# Patient Record
Sex: Female | Born: 1970 | Race: Black or African American | Hispanic: No | Marital: Single | Smoking: Former smoker
Health system: Southern US, Community
[De-identification: ages and names within clinical notes are randomized; demographics above are authoritative.]

## PROBLEM LIST (undated history)

## (undated) DIAGNOSIS — M1990 Unspecified osteoarthritis, unspecified site: Secondary | ICD-10-CM

## (undated) DIAGNOSIS — D649 Anemia, unspecified: Secondary | ICD-10-CM

## (undated) DIAGNOSIS — F419 Anxiety disorder, unspecified: Secondary | ICD-10-CM

## (undated) DIAGNOSIS — R7303 Prediabetes: Secondary | ICD-10-CM

## (undated) DIAGNOSIS — F32A Depression, unspecified: Secondary | ICD-10-CM

## (undated) DIAGNOSIS — I10 Essential (primary) hypertension: Secondary | ICD-10-CM

## (undated) HISTORY — PX: TONSILLECTOMY: SUR1361

## (undated) HISTORY — PX: ABDOMINAL HYSTERECTOMY: SHX81

## (undated) HISTORY — PX: TUBAL LIGATION: SHX77

---

## 2018-05-11 ENCOUNTER — Encounter (HOSPITAL_BASED_OUTPATIENT_CLINIC_OR_DEPARTMENT_OTHER): Payer: Self-pay

## 2018-05-11 ENCOUNTER — Emergency Department (HOSPITAL_BASED_OUTPATIENT_CLINIC_OR_DEPARTMENT_OTHER): Payer: Self-pay

## 2018-05-11 ENCOUNTER — Other Ambulatory Visit: Payer: Self-pay

## 2018-05-11 ENCOUNTER — Emergency Department (HOSPITAL_BASED_OUTPATIENT_CLINIC_OR_DEPARTMENT_OTHER)
Admission: EM | Admit: 2018-05-11 | Discharge: 2018-05-11 | Disposition: A | Discharge status: Home / Self Care | Payer: Self-pay | Attending: Emergency Medicine | Admitting: Emergency Medicine

## 2018-05-11 DIAGNOSIS — R030 Elevated blood-pressure reading, without diagnosis of hypertension: Secondary | ICD-10-CM | POA: Insufficient documentation

## 2018-05-11 DIAGNOSIS — M1712 Unilateral primary osteoarthritis, left knee: Secondary | ICD-10-CM | POA: Insufficient documentation

## 2018-05-11 DIAGNOSIS — M25562 Pain in left knee: Secondary | ICD-10-CM

## 2018-05-11 DIAGNOSIS — F17200 Nicotine dependence, unspecified, uncomplicated: Secondary | ICD-10-CM | POA: Insufficient documentation

## 2018-05-11 DIAGNOSIS — Z91040 Latex allergy status: Secondary | ICD-10-CM | POA: Insufficient documentation

## 2018-05-11 DIAGNOSIS — G8929 Other chronic pain: Secondary | ICD-10-CM

## 2018-05-11 NOTE — Discharge Instructions (Addendum)
Please follow-up with your primary care provider regarding your visit today if you do not have one please see the resources attached below. You may also follow-up with the sports medicine doctor, Dr. Pearletha ForgeHudnall, for further evaluation of your knee pain. Please return to the emergency department for any new or worsening symptoms. You may also use rest, ice/heat, compression and elevation to help with your knee pain. Your blood pressure was elevated today, please be sure to follow-up with your primary care provider regarding this.  Please also refer to the attached packet regarding blood pressures.  Contact a doctor if: The pain does not stop. The pain changes or gets worse. You have a fever along with knee pain. Your knee gives out or locks up. Your knee swells, and becomes worse. Get help right away if: Your knee feels warm. You cannot move your knee. You have very bad knee pain. You have chest pain. You have trouble breathing.  RESOURCE GUIDE  Chronic Pain Problems: Contact [facility]  [phone] Patients need to be referred by their primary care doctor.  Insufficient Money for Medicine: Contact United Way:  call "211" or [facility] [phone].  No Primary Care Doctor: Call Health Connect  [phone] - can help you locate a primary care doctor that  accepts your insurance, provides certain services, etc. Physician Referral Service- [phone]  Agencies that provide inexpensive medical care: [facility]  [phone] [facility]  [phone] [facility]  [phone] [facility]  [phone] Planned Parenthood  [phone] [facility]  [phone]  Medicaid-accepting [facility] Providers: [facility]- [address], [address]  [phone], Mon-Fri 9am-7pm, Sat 9am-1pm [facility]- [address], [address] [place]  [phone] [facility]- [address], [address] [place]  [phone] [facility]- [address] [place] [address]  [phone] Renaye RakersVeita Bland- [address], [address], [phone]  Only accepts [place] patients after they have their name  applied to their card  Self Pay (no insurance) in [facility]: Sickle Cell Patients: Dr Willey BladeEric Dean, [facility]  [address], [phone] [facility]- [address]  [phone]       Redge Gainer-     [facility]- [address], [address]       -     [facility]- see information above (Speak to CitigroupPam H if you do not have insurance)       -  [facility]- [address], [phone]       -  [facility]- [address],  [phone]       -  [facility]- [address] [place] [address], [phone]       -  Dr Julio Sickssei-Bonsu-  [address], [address], [place], [phone]       -  [facility]- [address], [phone]       -  [facility] [place]- [address] [place] [address], [phone], also [address], [phone]       -    [facility]- [address], [phone], 1st & 3rd Saturday   every month, 10am-1pm  1) Find a Doctor and Pay Out of Pocket Although you won't have to find out who is covered by your insurance plan, it is a  good idea to ask around and get recommendations. You will then need to call the office and see if the doctor you have chosen will accept you as a new patient and what types of options they offer for patients who are self-pay. Some doctors offer discounts or will set up payment plans for their patients who do not have insurance, but you will need to ask so you aren't surprised when you get to your appointment.  2) Contact Your Local Health Department Not all health departments have doctors that can see patients for sick visits, but many do, so it is worth a call to see if yours does. If you don't know where your local health department is, you  can check in your phone book. The CDC also has a tool to help you locate your state's health department, and many state websites also have listings of all of their local health departments.  3) Find a Walk-in Clinic If your illness is not likely to be very severe or complicated, you may want to try a walk in clinic. These are popping up all over the country in pharmacies, drugstores, and shopping centers. They're usually staffed by nurse practitioners or physician assistants that have been trained to treat common illnesses and complaints. They're usually fairly quick and inexpensive. However, if you have serious medical issues or chronic medical problems, these are probably not your best option  STD Testing [facility] Department of [facility] [place], STD Clinic, [address], [place], phone [phone] or [phone].  Monday - Friday, call for an appointment. [facility] Department of [employer], STD Clinic, [address], [place], phone [phone] or [phone].  Monday - Friday, call for an appointment.  Abuse/Neglect: [facility] Child Abuse Hotline 825-269-4869 [facility] Child Abuse Hotline 6126992959 (After Hours)  Emergency Shelter:  [facility] 614-429-8122  Maternity Homes: Room at the [place] [facility] [phone] [facility] 2233747985  MRSA Hotline #:   (518)534-4123  [facility] Resources  [facility] [place]  United Way [facility] Dept. [address]                 [address]         [address]  Blondell Reveal Phone:  [phone]                                  Phone:  [phone]                   Phone:  [phone]  [facility], [phone] [phone]       -     [facility] in [place], [address]  [phone], [place] [phone] or [phone] (After Hours)   [place]  Substance Abuse Resources: Alcohol and Drug Services  [place] 205-104-6995 The [facility] Chinita Pester [phone] Residential & Outpatient Substance Abuse Program  [phone]  Psychological Services: [place]  [phone] [place]  [place], [place] [address] [place], [place]: [phone] or [phone], PicCapture.uy  Dental Assistance  If unable to pay or uninsured, contact:  [facility] or [facility]. to become qualified for the adult dental clinic.  Patients with Medicaid: [facility] [phone] [address], [place] [address], [phone]  If unable to pay, or uninsured, contact [facility] [phone]) or [employer] [phone] in [place], [place] in [facility]) to become qualified for the adult dental clinic   Other [place]- [place], [place], [place], [address], [place], [place], 2nd and 4th Thursday of the month at 6:30am.  10 clients each day by appointment, can sometimes see walk-in patients if someone does not show for an appointment. [facility]- [address] Hillard Danker [place], [place], [address], [place], [place], [place], [address], [place] Department- [phone] [facility] [phone]

## 2018-05-11 NOTE — ED Provider Notes (Signed)
[facility] EMERGENCY DEPARTMENT Provider Note   CSN: [id number] Arrival date & time: 05/11/18  1918     History   Chief Complaint Chief Complaint  Patient presents with  . Knee Pain    HPI Stephanie Reilly is a 47 y.o. female presenting for left knee pain.  She states that this pain has been chronic since she was in high school and dislocated her left knee.  Patient says that the pain has worsened in the last 2 weeks, she describes it as a "bone on bone" sharp pain that is only present when she walks.  Patient states that she has not taken anything for her pain but she does wear a brace that does provide some relief.  Patient states that when she is walking the pain becomes severe.  Patient denies new injury to her leg, fever, swelling, redness.  HPI  History reviewed. No pertinent past medical history.  There are no active problems to display for this patient.   Past Surgical History:  Procedure Laterality Date  . ABDOMINAL HYSTERECTOMY    . TONSILLECTOMY    . TUBAL LIGATION       OB History   None      Home Medications    Prior to Admission medications   Not on File    Family History No family history on file.  Social History Social History   Tobacco Use  . Smoking status: Current Every Day Smoker  . Smokeless tobacco: Never Used  Substance Use Topics  . Alcohol use: Never    Frequency: Never  . Drug use: Yes    Types: Marijuana     Allergies   Chocolate; Ibuprofen; Latex; Naproxen; and Penicillins   Review of Systems Review of Systems  Constitutional: Negative.  Negative for chills, fatigue and fever.  Musculoskeletal: Positive for arthralgias. Negative for joint swelling.  Skin: Negative.  Negative for color change and wound.  Neurological: Negative.  Negative for weakness and numbness.     Physical Exam Updated Vital Signs BP (!) 156/103 (BP Location: Left Arm)   Pulse 99   Temp 98.4 F (36.9 C) (Oral)   Resp 20   Ht 5'  1" (1.549 m)   Wt 112.5 kg (248 lb)   SpO2 100%   BMI 46.86 kg/m   Physical Exam  Constitutional: She is oriented to person, place, and time. She appears well-developed and well-nourished. No distress.  HENT:  Head: Normocephalic and atraumatic.  Right Ear: External ear normal.  Left Ear: External ear normal.  Nose: Nose normal.  Eyes: Pupils are equal, round, and reactive to light. EOM are normal.  Neck: Trachea normal and normal range of motion. No tracheal deviation present.  Pulmonary/Chest: Effort normal. No respiratory distress.  Musculoskeletal: Normal range of motion. She exhibits tenderness. She exhibits no edema or deformity.       Left knee: She exhibits normal range of motion, no swelling, no deformity and no erythema. Tenderness found. Medial joint line, lateral joint line and patellar tendon tenderness noted.  Left Knee:   Appearance normal. No obvious deformity. No skin swelling, erythema, heat, fluctuance or break of the skin.  Tenderness to palpation over anterior knee.  Active and passive flexion and extension intact with mild pain.  Negative anterior/poster drawer bilaterally. Negative ballottement test. No varus or valgus laxity or locking. No tenderness to palpation of hips or ankles.Compartments soft. Neurovascularly intact distally to site of injury.  Neurological: She is alert and oriented to  person, place, and time. No sensory deficit.  Skin: Skin is warm and dry. Capillary refill takes less than 2 seconds. No rash noted. No erythema.  Psychiatric: She has a normal mood and affect. Her behavior is normal.     ED Treatments / Results  Labs (all labs ordered are listed, but only abnormal results are displayed) Labs Reviewed - No data to display  EKG None  Radiology Dg Knee Complete 4 Views Left  Result Date: 05/11/2018 CLINICAL DATA:  Chronic left knee pain worsening over the past 2 weeks. EXAM: LEFT KNEE - COMPLETE 4+ VIEW COMPARISON:  None FINDINGS:  Mild-to-moderate femorotibial and mild patellofemoral joint space narrowing and spurring is identified consistent with osteoarthritic change. No acute fracture, dislocation or suspicious osseous lesions. Probable trace joint effusion. Soft tissues are unremarkable. 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 Electronically Signed   By: Tollie Eth M.D.   On: 05/11/2018 21:02    Procedures Procedures (including critical care time)  Medications Ordered in ED Medications - No data to display   Initial Impression / Assessment and Plan / ED Course  I have reviewed the triage vital signs and the nursing notes.  Pertinent labs & imaging results that were available during my care of the patient were reviewed by me and considered in my medical decision making (see chart for details).    The patient was noted to have elevated BP in ED today. I have spoken with the patient regarding elevated blood pressure readings and the need for improved management. I instructed the patient to followup with their PCP within 1 week for BP check. I also counseled the patient regarding the signs and symptoms which would require an emergent visit to an emergency department for hypertensive urgency and/or hypertensive emergency.  Patient with acute on chronic left knee pain.  No new injury or trauma to the area.  Patient with full active range of motion to the left lower extremity, neurovascular intact to the left lower extremity.  Imaging today shows tricompartmental osteoarthritis.  I have given the patient referral to establish a primary care provider as well as a referral to sports medicine for further evaluation and treatment.  I have encouraged the patient to use home conservative therapies for her pain, RICE.  Patient with allergy to NSAIDs.  Patient given knee brace in department as well as crutches to use if she feels necessary.  At this time there  does not appear to be any evidence of an acute emergency medical condition and the patient appears stable for discharge with appropriate outpatient follow up. Diagnosis was discussed with patient who verbalizes understanding of care plan and is agreeable to discharge. I have discussed return precautions with patient who verbalizes understanding of return precautions. Patient strongly encouraged to follow-up with their PCP. All questions answered.   Note: Portions of this report may have been transcribed using voice recognition software. Every effort was made to ensure accuracy; however, inadvertent computerized transcription errors may still be present.   Final Clinical Impressions(s) / ED Diagnoses   Final diagnoses:  Chronic pain of left knee  Osteoarthritis of left knee, unspecified osteoarthritis type    ED Discharge Orders    None       Elizabeth Palau 05/12/18 1610    Pricilla Loveless, MD 05/12/18 1536

## 2018-05-11 NOTE — ED Notes (Signed)
ED Provider at bedside. 

## 2018-05-11 NOTE — ED Notes (Signed)
Pt states she has used crutches before  and demonstrated the use

## 2018-05-11 NOTE — ED Triage Notes (Addendum)
C/o left knee pain "since high school"-worse x 2 week-denies injury-NAD-limping gait

## 2018-06-24 ENCOUNTER — Ambulatory Visit (INDEPENDENT_AMBULATORY_CARE_PROVIDER_SITE_OTHER): Payer: No Typology Code available for payment source | Admitting: Family Medicine

## 2018-06-24 ENCOUNTER — Encounter: Payer: Self-pay | Admitting: Family Medicine

## 2018-06-24 VITALS — BP 172/153 | HR 121 | Ht 62.0 in | Wt 220.0 lb

## 2018-06-24 DIAGNOSIS — M25562 Pain in left knee: Secondary | ICD-10-CM

## 2018-06-24 NOTE — Progress Notes (Signed)
   Subjective:    Patient ID: Stephanie Reilly, female    DOB: 06/09/1971, 47 y.o.   MRN: [id number]  HPI 47 year old female who presents with left sided knee pain. Patient was allegedly walking at work on 06/22/2018 when she stumbled on a raised rug that was at her job. She felt her knee hyperextend and felt a pop at that time. Almost immediately afterwards she developed pain in her entire left knee. She was seen in the ED on 9/16 and was diagnosed with a "knee strain". She was placed in an immobilizer by the ed and recommended to take ibuprofen. She has a known allergy to ibuprofen that causes itching and she takes benadryl to prevent hives.   She states that these measures have not helped her pain very much. She still has 10/10 pain in her knee. Of note the patient was seen in the ed on 05/11/2018 for similar knee pain. She had an xray which showed tricompartmental arthritis. That pain never really resolved. Her current pain is hurting her more in the anterior distribution.  Review of Systems Per hpi  Reviewed past medical, past surgical, allergies, social history and medications    Objective:   Physical Exam Gen: alert, well appearing, AA female, in no acute distress Cards: warm, well perfused, palpable pt/dp LLE Pulm: no increased work of breathing, no accessory muscle use Neuro: Intact sensation to entirety of LLE. 10/10 pain to even light palpation of left knee extending from superior pole of patella to tibial plateau and from medial to lateral condyle.  MSK: Knee No gross deformity, ecchymoses, swelling.  10/10 pain to even mild palpation of left knee, light touch. Range of motion limited by effort 2/2 to pain. Full extension but will only flex to 30 degrees. Collateral ligaments intact. Could not perform other testing due to pain. 5/5 strength knee flexion and extension NV intact distally.  Ultrasound performed: Able to visualize intact quadriceps and patellar tendon. OA noted.  Visualized meniscus intact, medial parameniscal cyst noted. Intact MCL and LCL.  Minimal effusion.    Assessment & Plan:  47 year old female who presents for left knee pain after allegedly stumbling on rug at work. Unable to get a reliable exam due to patient's intractable pain. Some of her pain appears to be outside of normal anatomic pain (pain with light touch). Ultrasound performed and was reassuring as LCL, MCL, visualized meniscus, patellar, and quad tendons all intact. Her lack of swelling and overall presentation make ACL and PCL pathology less likely. Will give patient a hinged knee brace to help with mobility. Gave instructions on helping to improve knee ROM. She is to follow up in 1 week, with the hope that her pain and inflammation will have decreased resulting in a more reliable exam.  Acute on chronic Left Knee pain - hinged knee brace - tylenol 500mg  1-2 tabs tid - topical aspercreme - salonpas patches - ice to help reduce swellng - follow up in  1week  Hypertension BP 170s/150s with pulse of 153. Appears to have had hypertension issues in the past. Feel these numbers cannot be fully explained by her pain. Instructed patient to follow up with pcp for BP.  She reported she checks at home and BP is 'normal' there.  Myrene BuddyJacob Linnie Mcglocklin MD PGY-2 Family Medicine Resident

## 2018-06-24 NOTE — Patient Instructions (Signed)
Your knee exam is reassuring. Switch from the immobilizer to a hinged knee brace - wear this when up and walking around. Icing 15 minutes at a time 3-4 times a day. Work on being able to bend your knee to 90 degrees as we discussed. Tylenol 500mg  1-2 tabs three times a day. Topical aspercreme up to 4 times a day for pain and inflammation. Salon pas patches topically to help with pain as well. Follow up with me in 1 week for reevaluation.

## 2018-06-27 ENCOUNTER — Encounter: Payer: Self-pay | Admitting: Family Medicine

## 2018-06-30 ENCOUNTER — Encounter: Payer: Self-pay | Admitting: Family Medicine

## 2018-06-30 ENCOUNTER — Ambulatory Visit (INDEPENDENT_AMBULATORY_CARE_PROVIDER_SITE_OTHER): Payer: Medicaid Other | Admitting: Family Medicine

## 2018-06-30 VITALS — Ht 62.0 in | Wt 220.0 lb

## 2018-06-30 DIAGNOSIS — M25562 Pain in left knee: Secondary | ICD-10-CM

## 2018-06-30 NOTE — Progress Notes (Signed)
   Subjective:    Patient ID: Stephanie Reilly, female    DOB: 01/29/1971, 47 y.o.   MRN: [id number]  HPI   299/4118: 47 year old female who presents with left sided knee pain. Patient was allegedly walking at work on 06/22/2018 when she stumbled on a raised rug that was at her job. She felt her knee hyperextend and felt a pop at that time. Almost immediately afterwards she developed pain in her entire left knee. She was seen in the ED on 9/16 and was diagnosed with a "knee strain". She was placed in an immobilizer by the ed and recommended to take ibuprofen. She has a known allergy to ibuprofen that causes itching and she takes benadryl to prevent hives.   She states that these measures have not helped her pain very much. She still has 10/10 pain in her knee. Of note the patient was seen in the ed on 05/11/2018 for similar knee pain. She had an xray which showed tricompartmental arthritis. That pain never really resolved. Her current pain is hurting her more in the anterior distribution.  9/24: Patient reports she's doing better. Pain level down to 8/10 level, still sharp and worse with walking. Icing helps but if goes for 7-8 minutes will feel pain down her lower leg. Has been wearing brace.  Taking tylenol and doing home exercises. No skin changes.  Review of Systems Per hpi  Reviewed past medical, past surgical, allergies, social history and medications    Objective:   Physical Exam  Gen: NAD, comfortable in exam room  Left knee: No gross deformity, ecchymoses, effusion. No longer with tenderness on light touch.  TTP both joint lines, post patellar facets. ROM 0 - 90 degrees with 5/5 strength flexion and extension. Negative ant/post drawers. Negative valgus/varus testing. Negative lachmans. Negative mcmurrays, apleys, patellar apprehension. NV intact distally.    Assessment & Plan:  1. Left knee pain - exam is reassuring again.  Continue with hinged knee brace.  Icing.  Start physical  therapy and do home exercises on days she doesn't go to therapy.  Tylenol, aspercreme, salon pas.  F/u in 1 month.

## 2018-06-30 NOTE — Patient Instructions (Signed)
Your knee exam is reassuring again and you're improving. Wear the knee brace when up and walking around for 5 more weeks. Icing for 5 minutes at a time instead of 15 since you're getting symptoms at around 7-8 minutes. Start physical therapy and do home exercises on days you don't go to therapy. Tylenol 500mg  1-2 tabs three times a day. Topical aspercreme up to 4 times a day for pain and inflammation. Salon pas patches topically to help with pain as well. Follow up with me in 1 month for reevaluation.

## 2018-07-02 ENCOUNTER — Telehealth: Payer: Self-pay | Admitting: Family Medicine

## 2018-07-02 NOTE — Telephone Encounter (Signed)
Patient called regarding work last night. States her employer did not provide a job that fit into her restrictions. States she was required to walk around the majority of the night. Patient is asking to be excused from work until follow up

## 2018-07-02 NOTE — Telephone Encounter (Signed)
Altered note so if nothing available with restrictions for her to be out of work until follow-up.

## 2018-07-02 NOTE — Telephone Encounter (Signed)
Patient informed. Letter faxed to employer per patient request

## 2018-07-30 ENCOUNTER — Ambulatory Visit: Payer: Medicaid Other | Admitting: Family Medicine

## 2018-08-10 ENCOUNTER — Ambulatory Visit (INDEPENDENT_AMBULATORY_CARE_PROVIDER_SITE_OTHER): Payer: Medicaid Other | Admitting: Family Medicine

## 2018-08-10 ENCOUNTER — Encounter: Payer: Self-pay | Admitting: Family Medicine

## 2018-08-10 VITALS — BP 150/119 | HR 94 | Ht 62.0 in | Wt 205.0 lb

## 2018-08-10 DIAGNOSIS — M25562 Pain in left knee: Secondary | ICD-10-CM

## 2018-08-10 NOTE — Progress Notes (Signed)
Subjective:    Patient ID: Stephanie Reilly, female    DOB: 08-21-1971, 47 y.o.   MRN: [id number]  HPI   68/1: 47 year old female who presents with left sided knee pain. Patient was allegedly walking at work on 06/22/2018 when she stumbled on a raised rug that was at her job. She felt her knee hyperextend and felt a pop at that time. Almost immediately afterwards she developed pain in her entire left knee. She was seen in the ED on 9/16 and was diagnosed with a "knee strain". She was placed in an immobilizer by the ed and recommended to take ibuprofen. She has a known allergy to ibuprofen that causes itching and she takes benadryl to prevent hives.   She states that these measures have not helped her pain very much. She still has 10/10 pain in her knee. Of note the patient was seen in the ed on 05/11/2018 for similar knee pain. She had an xray which showed tricompartmental arthritis. That pain never really resolved. Her current pain is hurting her more in the anterior distribution.  9/24: Patient reports she's doing better. Pain level down to 8/10 level, still sharp and worse with walking. Icing helps but if goes for 7-8 minutes will feel pain down her lower leg. Has been wearing brace.  Taking tylenol and doing home exercises. No skin changes.  11/4: Patient reports that she is expressing worsening left knee pain.  She now reports 10/10 sharp pain over the medial, anterior and lateral aspect of the knee with radiation into the lower leg.  She states she has been doing some home exercises under the guidance of her cousin.  She wears her hinged knee brace throughout the day as well as at night.  She has begun using a cane to stand from a seated position due to her pain.  Overall pain is worse with trying to move her knee or any activity.  She gets some temporary relief with Aspercreme or salon pas patches.  She denies any swelling or erythema.  No numbness or tingling lower extremity.  No associated  skin changes reported.  Review of Systems Per hpi  Reviewed past medical, past surgical, allergies, social history and medications    Objective:   Physical Exam  GEN: Awake, alert, no acute distress  Left knee: - Inspection: no gross deformity. No swelling/effusion, erythema or bruising. Skin intact - Palpation: Exquisitely tender to light touch over the medial, anterior, and lateral aspects of the knee as well as distally over the proximal tibia - ROM: She is able to achieve full extension.  Range of motion in flexion is significantly limited due to pain and guarding - Strength: Unable to adequately assess due to pain.  Patient has at least 4/5 strength in extension and flexion. - Neuro/vasc: NV intact distally - Special Tests: Significant pain with varus and valgus stress but no laxity appreciated Unable to assess integrity of ACL/PCL due to patient pain    Assessment & Plan:  1.  Left knee pain- patient has known mild arthritis in the knee, however her pain is out of proportion to what would be expected from arthritis.  She is also experiencing pain out of proportion to what we expected at this point for any internal derangement or fracture. - Recommend MRI of her knee for further evaluation.  She's gone 6 weeks with conservative treatment and not improved as expected.  She will discuss the financial aspects of this with her lawyer regarding  Worker's Comp.  When she decides on insurance/payment options, she will contact us and we will place the order. - In the meantime continue light duty restriction at work - Continue Aspercreme and salon poss patches - Continue ice - Range of motion exercises - Follow-up after MRI

## 2018-08-10 NOTE — Patient Instructions (Signed)
Wear the knee brace when up and walking around. Icing for 5 minutes at a time. Continue motion exercises of your knee and ankle. Tylenol 500mg  1-2 tabs three times a day. Topical aspercreme up to 4 times a day for pain and inflammation. Salon pas patches topically to help with pain as well. Call me asap after you discuss with your lawyer and we will go ahead with an MRI. Follow up after the MRI.

## 2018-08-28 ENCOUNTER — Encounter: Payer: Self-pay | Admitting: Family Medicine

## 2018-08-28 ENCOUNTER — Ambulatory Visit (INDEPENDENT_AMBULATORY_CARE_PROVIDER_SITE_OTHER): Payer: No Typology Code available for payment source | Admitting: Family Medicine

## 2018-08-28 VITALS — BP 188/128 | HR 114 | Ht 62.0 in | Wt 205.0 lb

## 2018-08-28 DIAGNOSIS — M25562 Pain in left knee: Secondary | ICD-10-CM

## 2018-08-28 MED ORDER — LIDOCAINE 5 % EX PTCH: 1.0000 | MEDICATED_PATCH | CUTANEOUS | 1 refills | Status: DC

## 2018-08-28 NOTE — Patient Instructions (Signed)
We will go ahead with an orthopedic referral at this point to discuss arthroscopic debridement for your medial meniscus tear.   Wear the knee brace when up and walking around. Icing for 5 minutes at a time. Continue motion exercises of your knee and ankle. Tylenol 500mg  1-2 tabs three times a day. Topical aspercreme up to 4 times a day for pain and inflammation. Salon pas patches topically to help with pain as well. Follow up with me as needed though.

## 2018-08-29 ENCOUNTER — Encounter: Payer: Self-pay | Admitting: Family Medicine

## 2018-08-29 NOTE — Progress Notes (Signed)
Subjective:    Patient ID: Stephanie Reilly, female    DOB: [birth year], 47 y.o.   MRN: [id number]  HPI   47/55: 47 year old female who presents with left sided knee pain. Patient was allegedly walking at work on 06/22/2018 when she stumbled on a raised rug that was at her job. She felt her knee hyperextend and felt a pop at that time. Almost immediately afterwards she developed pain in her entire left knee. She was seen in the ED on 9/16 and was diagnosed with a "knee strain". She was placed in an immobilizer by the ed and recommended to take ibuprofen. She has a known allergy to ibuprofen that causes itching and she takes benadryl to prevent hives.   She states that these measures have not helped her pain very much. She still has 10/10 pain in her knee. Of note the patient was seen in the ed on 05/11/2018 for similar knee pain. She had an xray which showed tricompartmental arthritis. That pain never really resolved. Her current pain is hurting her more in the anterior distribution.  9/24: Patient reports she's doing better. Pain level down to 8/10 level, still sharp and worse with walking. Icing helps but if goes for 7-8 minutes will feel pain down her lower leg. Has been wearing brace.  Taking tylenol and doing home exercises. No skin changes.  11/4: Patient reports that she is expressing worsening left knee pain.  She now reports 10/10 sharp pain over the medial, anterior and lateral aspect of the knee with radiation into the lower leg.  She states she has been doing some home exercises under the guidance of her cousin.  She wears her hinged knee brace throughout the day as well as at night.  She has begun using a cane to stand from a seated position due to her pain.  Overall pain is worse with trying to move her knee or any activity.  She gets some temporary relief with Aspercreme or salon pas patches.  She denies any swelling or erythema.  No numbness or tingling lower extremity.  No associated  skin changes reported.  11/22: Patient returns for MRI results, to discuss next steps. Pain still sharp, max 6/10 level now and worse with walking.  Review of Systems Per hpi  Reviewed past medical, past surgical, allergies, social history and medications    Objective:   Physical Exam  GEN: Awake, alert, no acute distress  Rest of exam not repeated today. Left knee: - Inspection: no gross deformity. No swelling/effusion, erythema or bruising. Skin intact - Palpation: Exquisitely tender to light touch over the medial, anterior, and lateral aspects of the knee as well as distally over the proximal tibia - ROM: She is able to achieve full extension.  Range of motion in flexion is significantly limited due to pain and guarding - Strength: Unable to adequately assess due to pain.  Patient has at least 4/5 strength in extension and flexion. - Neuro/vasc: NV intact distally - Special Tests: Significant pain with varus and valgus stress but no laxity appreciated Unable to assess integrity of ACL/PCL due to patient pain    Assessment & Plan:  1.  Left knee pain- MRI reviewed and discussed with patient.  She has a complex tear of the medial meniscus with displaced tissue as well.  Given she has not improved with conservative treatment will go ahead with orthopedic referral to discuss arthroscopic debridement.  Continue current work restrictions.  Topical aspercreme, salon pas, icing.  F/u with us prn.

## 2018-08-31 ENCOUNTER — Encounter: Payer: Self-pay | Admitting: Family Medicine

## 2019-05-04 IMAGING — DX DG KNEE COMPLETE 4+V*L*
4 series · 4 of 4 positions shown · non-contrast
Comparison: None

CLINICAL DATA: Chronic left knee pain worsening over the past 2
weeks.

EXAM:
LEFT KNEE - COMPLETE 4+ VIEW

[knee ap]
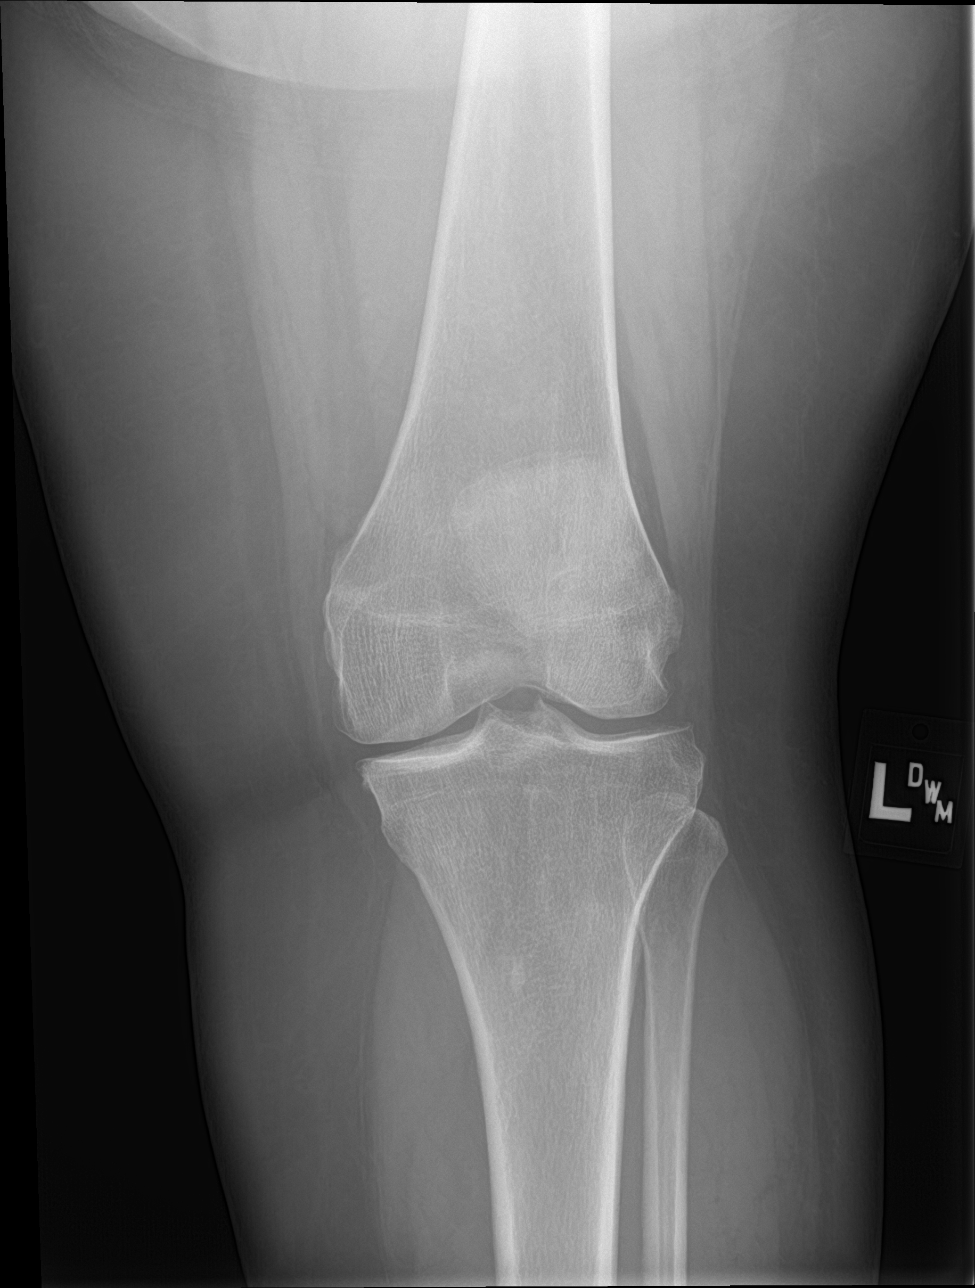

[knee lat]
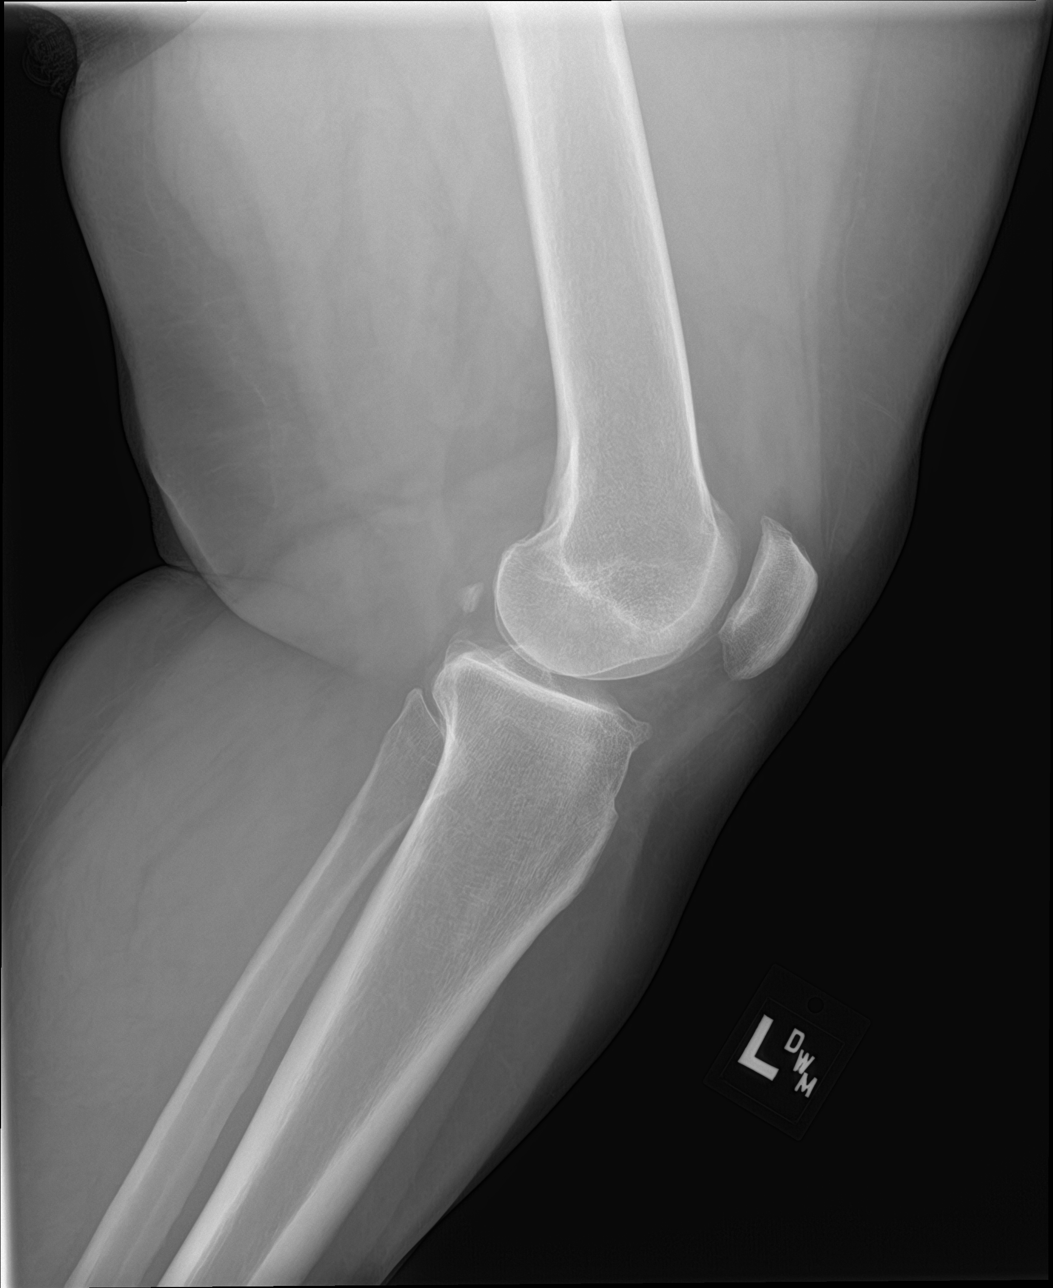

[knee obl (1 of 2)]
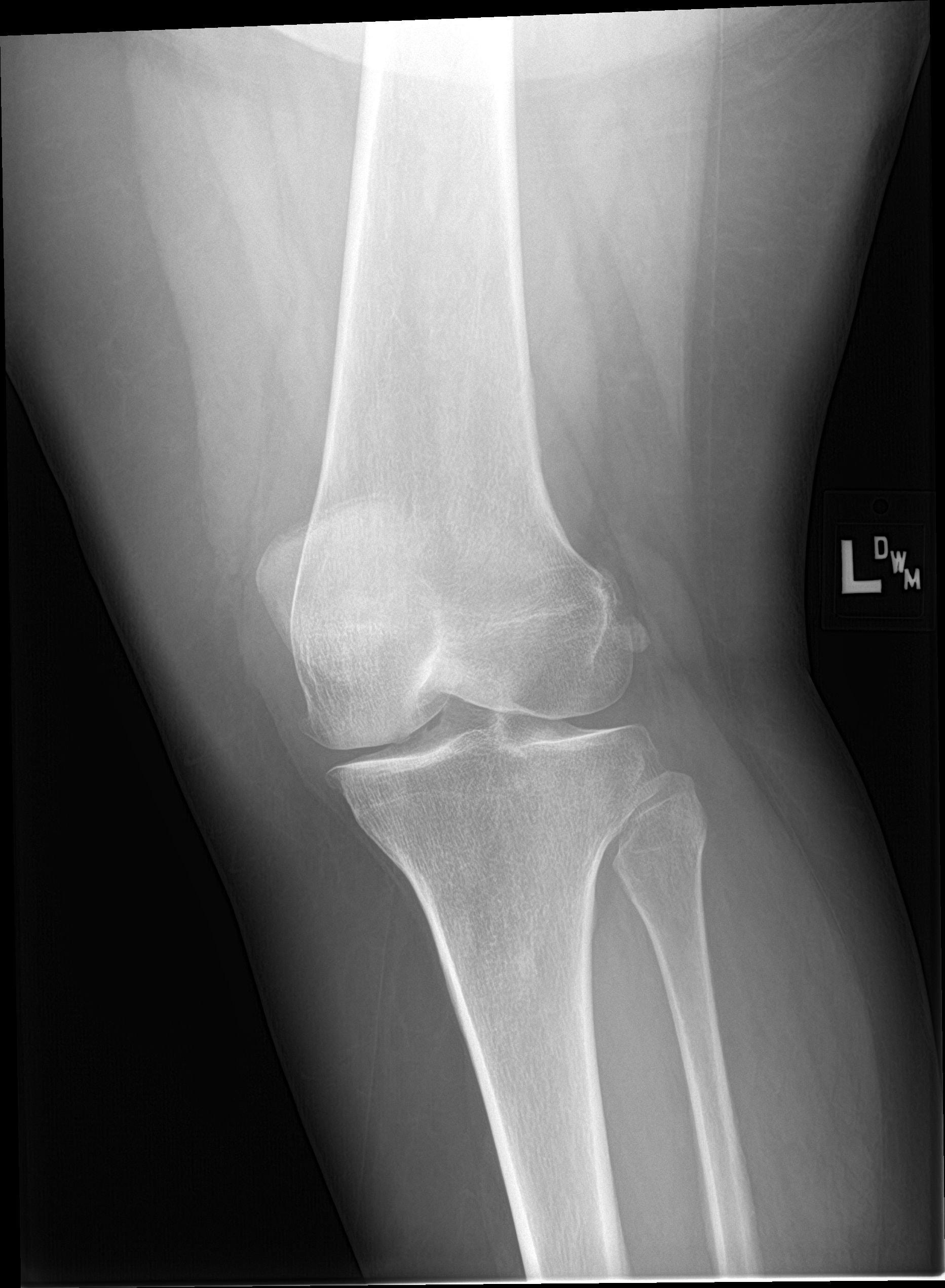

[knee obl (2 of 2)]
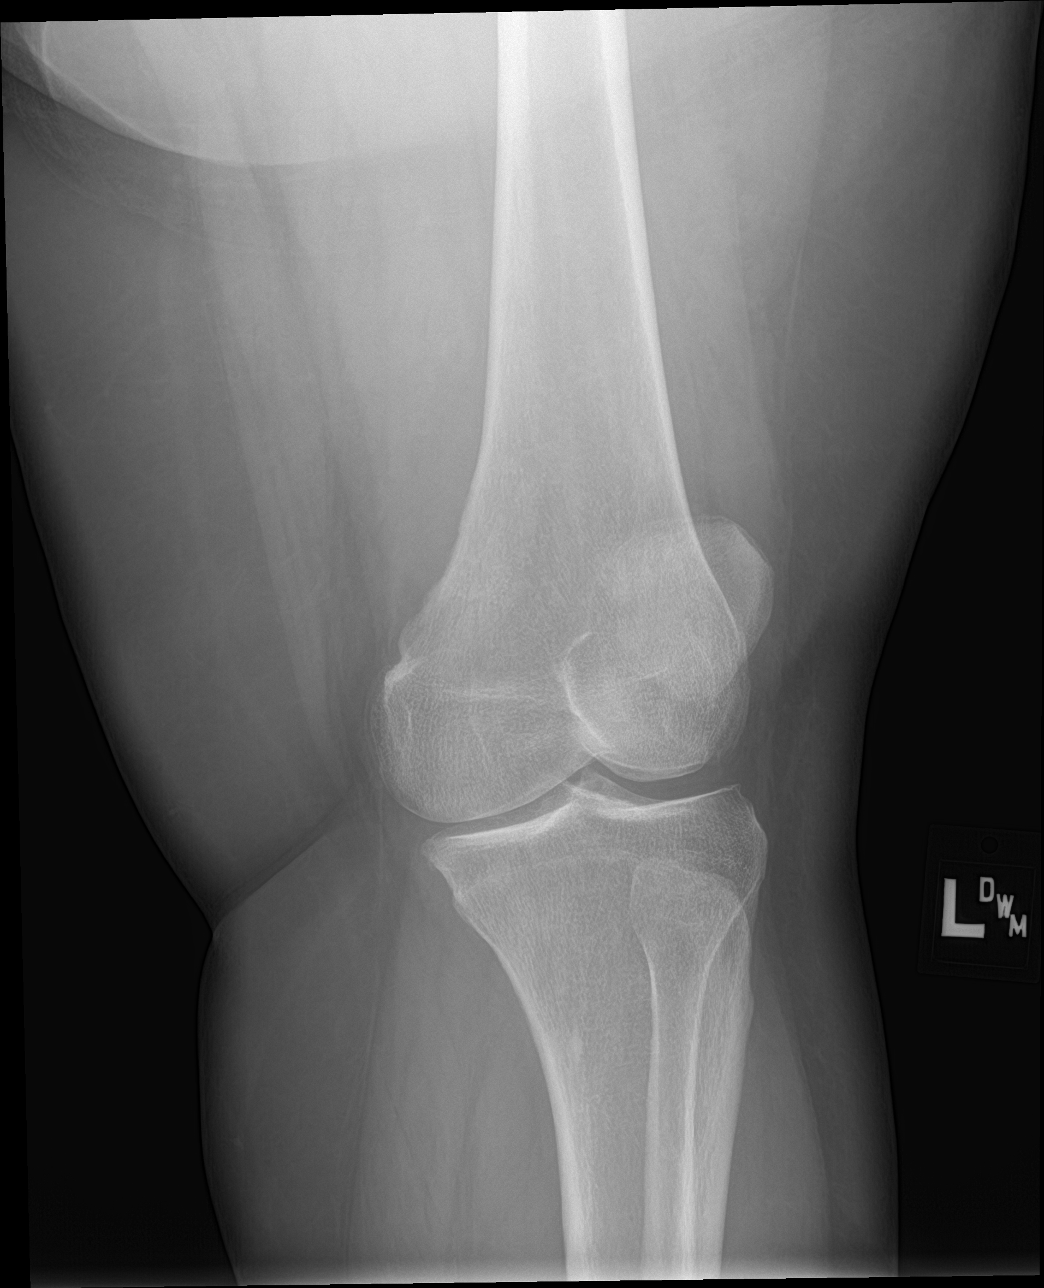

[4 of 4 positions shown; findings below may reference images not displayed]

FINDINGS: Mild-to-moderate femorotibial and mild patellofemoral joint space
narrowing and spurring is identified consistent with osteoarthritic
change. No acute fracture, dislocation or suspicious osseous
lesions. Probable trace joint effusion. Soft tissues are
unremarkable.
IMPRESSION: Mild tricompartmental osteoarthritis of the left knee more so
involving the femorotibial compartment.

## 2022-10-07 DIAGNOSIS — Z419 Encounter for procedure for purposes other than remedying health state, unspecified: Secondary | ICD-10-CM | POA: Diagnosis not present

## 2022-10-29 DIAGNOSIS — M25562 Pain in left knee: Secondary | ICD-10-CM | POA: Diagnosis not present

## 2022-10-29 DIAGNOSIS — F419 Anxiety disorder, unspecified: Secondary | ICD-10-CM | POA: Diagnosis not present

## 2022-10-29 DIAGNOSIS — G8929 Other chronic pain: Secondary | ICD-10-CM | POA: Diagnosis not present

## 2022-10-29 DIAGNOSIS — Z0000 Encounter for general adult medical examination without abnormal findings: Secondary | ICD-10-CM | POA: Diagnosis not present

## 2022-10-29 DIAGNOSIS — F32A Depression, unspecified: Secondary | ICD-10-CM | POA: Diagnosis not present

## 2022-11-07 DIAGNOSIS — Z419 Encounter for procedure for purposes other than remedying health state, unspecified: Secondary | ICD-10-CM | POA: Diagnosis not present

## 2022-11-11 DIAGNOSIS — F419 Anxiety disorder, unspecified: Secondary | ICD-10-CM | POA: Diagnosis not present

## 2022-11-11 DIAGNOSIS — M25561 Pain in right knee: Secondary | ICD-10-CM | POA: Diagnosis not present

## 2022-11-11 DIAGNOSIS — M170 Bilateral primary osteoarthritis of knee: Secondary | ICD-10-CM | POA: Diagnosis not present

## 2022-11-11 DIAGNOSIS — F32A Depression, unspecified: Secondary | ICD-10-CM | POA: Diagnosis not present

## 2022-11-11 DIAGNOSIS — M25562 Pain in left knee: Secondary | ICD-10-CM | POA: Diagnosis not present

## 2022-11-13 DIAGNOSIS — M25562 Pain in left knee: Secondary | ICD-10-CM | POA: Diagnosis not present

## 2022-11-13 DIAGNOSIS — Z1239 Encounter for other screening for malignant neoplasm of breast: Secondary | ICD-10-CM | POA: Diagnosis not present

## 2022-11-13 DIAGNOSIS — M25561 Pain in right knee: Secondary | ICD-10-CM | POA: Diagnosis not present

## 2022-11-13 DIAGNOSIS — M1990 Unspecified osteoarthritis, unspecified site: Secondary | ICD-10-CM | POA: Diagnosis not present

## 2022-11-13 DIAGNOSIS — Z0000 Encounter for general adult medical examination without abnormal findings: Secondary | ICD-10-CM | POA: Diagnosis not present

## 2022-11-13 DIAGNOSIS — Z131 Encounter for screening for diabetes mellitus: Secondary | ICD-10-CM | POA: Diagnosis not present

## 2022-11-13 DIAGNOSIS — F419 Anxiety disorder, unspecified: Secondary | ICD-10-CM | POA: Diagnosis not present

## 2022-11-27 DIAGNOSIS — F419 Anxiety disorder, unspecified: Secondary | ICD-10-CM | POA: Diagnosis not present

## 2022-11-27 DIAGNOSIS — Z7689 Persons encountering health services in other specified circumstances: Secondary | ICD-10-CM | POA: Diagnosis not present

## 2022-11-27 DIAGNOSIS — F32A Depression, unspecified: Secondary | ICD-10-CM | POA: Diagnosis not present

## 2022-11-29 DIAGNOSIS — Z1231 Encounter for screening mammogram for malignant neoplasm of breast: Secondary | ICD-10-CM | POA: Diagnosis not present

## 2022-11-29 DIAGNOSIS — Z7689 Persons encountering health services in other specified circumstances: Secondary | ICD-10-CM | POA: Diagnosis not present

## 2022-11-29 LAB — HM MAMMOGRAPHY: HM Mammogram: NORMAL (ref 0–4)

## 2022-12-06 DIAGNOSIS — Z419 Encounter for procedure for purposes other than remedying health state, unspecified: Secondary | ICD-10-CM | POA: Diagnosis not present

## 2022-12-24 DIAGNOSIS — Z7689 Persons encountering health services in other specified circumstances: Secondary | ICD-10-CM | POA: Diagnosis not present

## 2022-12-24 DIAGNOSIS — F419 Anxiety disorder, unspecified: Secondary | ICD-10-CM | POA: Diagnosis not present

## 2022-12-24 DIAGNOSIS — F32A Depression, unspecified: Secondary | ICD-10-CM | POA: Diagnosis not present

## 2023-01-06 DIAGNOSIS — Z419 Encounter for procedure for purposes other than remedying health state, unspecified: Secondary | ICD-10-CM | POA: Diagnosis not present

## 2023-02-05 DIAGNOSIS — Z419 Encounter for procedure for purposes other than remedying health state, unspecified: Secondary | ICD-10-CM | POA: Diagnosis not present

## 2023-02-07 ENCOUNTER — Observation Stay (HOSPITAL_COMMUNITY): Payer: Medicaid Other

## 2023-02-07 ENCOUNTER — Inpatient Hospital Stay (HOSPITAL_BASED_OUTPATIENT_CLINIC_OR_DEPARTMENT_OTHER)
Admission: EM | Admit: 2023-02-07 | Discharge: 2023-02-12 | DRG: 286 | Disposition: A | Discharge status: Home / Self Care | Payer: Medicaid Other | Attending: Internal Medicine | Admitting: Internal Medicine

## 2023-02-07 ENCOUNTER — Encounter (HOSPITAL_BASED_OUTPATIENT_CLINIC_OR_DEPARTMENT_OTHER): Payer: Self-pay | Admitting: Emergency Medicine

## 2023-02-07 ENCOUNTER — Other Ambulatory Visit: Payer: Self-pay

## 2023-02-07 ENCOUNTER — Emergency Department (HOSPITAL_BASED_OUTPATIENT_CLINIC_OR_DEPARTMENT_OTHER): Payer: Medicaid Other

## 2023-02-07 DIAGNOSIS — E876 Hypokalemia: Secondary | ICD-10-CM | POA: Diagnosis not present

## 2023-02-07 DIAGNOSIS — D638 Anemia in other chronic diseases classified elsewhere: Secondary | ICD-10-CM | POA: Diagnosis not present

## 2023-02-07 DIAGNOSIS — I5043 Acute on chronic combined systolic (congestive) and diastolic (congestive) heart failure: Secondary | ICD-10-CM | POA: Insufficient documentation

## 2023-02-07 DIAGNOSIS — Z888 Allergy status to other drugs, medicaments and biological substances status: Secondary | ICD-10-CM

## 2023-02-07 DIAGNOSIS — D509 Iron deficiency anemia, unspecified: Secondary | ICD-10-CM | POA: Diagnosis not present

## 2023-02-07 DIAGNOSIS — Z885 Allergy status to narcotic agent status: Secondary | ICD-10-CM

## 2023-02-07 DIAGNOSIS — I472 Ventricular tachycardia, unspecified: Secondary | ICD-10-CM | POA: Diagnosis not present

## 2023-02-07 DIAGNOSIS — I10 Essential (primary) hypertension: Secondary | ICD-10-CM | POA: Diagnosis present

## 2023-02-07 DIAGNOSIS — E66813 Obesity, class 3: Secondary | ICD-10-CM | POA: Diagnosis present

## 2023-02-07 DIAGNOSIS — I959 Hypotension, unspecified: Secondary | ICD-10-CM | POA: Diagnosis not present

## 2023-02-07 DIAGNOSIS — Z880 Allergy status to penicillin: Secondary | ICD-10-CM

## 2023-02-07 DIAGNOSIS — F419 Anxiety disorder, unspecified: Secondary | ICD-10-CM | POA: Diagnosis not present

## 2023-02-07 DIAGNOSIS — I2489 Other forms of acute ischemic heart disease: Secondary | ICD-10-CM | POA: Diagnosis present

## 2023-02-07 DIAGNOSIS — E8770 Fluid overload, unspecified: Secondary | ICD-10-CM | POA: Diagnosis not present

## 2023-02-07 DIAGNOSIS — Z91018 Allergy to other foods: Secondary | ICD-10-CM

## 2023-02-07 DIAGNOSIS — Z833 Family history of diabetes mellitus: Secondary | ICD-10-CM

## 2023-02-07 DIAGNOSIS — G4700 Insomnia, unspecified: Secondary | ICD-10-CM | POA: Diagnosis not present

## 2023-02-07 DIAGNOSIS — I272 Pulmonary hypertension, unspecified: Secondary | ICD-10-CM | POA: Diagnosis not present

## 2023-02-07 DIAGNOSIS — K429 Umbilical hernia without obstruction or gangrene: Secondary | ICD-10-CM | POA: Diagnosis not present

## 2023-02-07 DIAGNOSIS — Z6841 Body Mass Index (BMI) 40.0 and over, adult: Secondary | ICD-10-CM | POA: Diagnosis not present

## 2023-02-07 DIAGNOSIS — R0602 Shortness of breath: Secondary | ICD-10-CM | POA: Diagnosis not present

## 2023-02-07 DIAGNOSIS — F32A Depression, unspecified: Secondary | ICD-10-CM | POA: Diagnosis present

## 2023-02-07 DIAGNOSIS — I509 Heart failure, unspecified: Secondary | ICD-10-CM

## 2023-02-07 DIAGNOSIS — E119 Type 2 diabetes mellitus without complications: Secondary | ICD-10-CM | POA: Diagnosis not present

## 2023-02-07 DIAGNOSIS — Z8249 Family history of ischemic heart disease and other diseases of the circulatory system: Secondary | ICD-10-CM

## 2023-02-07 DIAGNOSIS — I493 Ventricular premature depolarization: Secondary | ICD-10-CM | POA: Diagnosis not present

## 2023-02-07 DIAGNOSIS — Z90710 Acquired absence of both cervix and uterus: Secondary | ICD-10-CM

## 2023-02-07 DIAGNOSIS — Z87891 Personal history of nicotine dependence: Secondary | ICD-10-CM

## 2023-02-07 DIAGNOSIS — R7989 Other specified abnormal findings of blood chemistry: Secondary | ICD-10-CM | POA: Insufficient documentation

## 2023-02-07 DIAGNOSIS — Z79899 Other long term (current) drug therapy: Secondary | ICD-10-CM

## 2023-02-07 DIAGNOSIS — Z560 Unemployment, unspecified: Secondary | ICD-10-CM

## 2023-02-07 DIAGNOSIS — I110 Hypertensive heart disease with heart failure: Secondary | ICD-10-CM | POA: Diagnosis not present

## 2023-02-07 DIAGNOSIS — Z825 Family history of asthma and other chronic lower respiratory diseases: Secondary | ICD-10-CM

## 2023-02-07 DIAGNOSIS — J90 Pleural effusion, not elsewhere classified: Secondary | ICD-10-CM | POA: Diagnosis not present

## 2023-02-07 DIAGNOSIS — Z7689 Persons encountering health services in other specified circumstances: Secondary | ICD-10-CM | POA: Diagnosis not present

## 2023-02-07 DIAGNOSIS — R079 Chest pain, unspecified: Secondary | ICD-10-CM | POA: Diagnosis not present

## 2023-02-07 DIAGNOSIS — Z9851 Tubal ligation status: Secondary | ICD-10-CM

## 2023-02-07 DIAGNOSIS — I428 Other cardiomyopathies: Secondary | ICD-10-CM | POA: Diagnosis present

## 2023-02-07 DIAGNOSIS — I5023 Acute on chronic systolic (congestive) heart failure: Secondary | ICD-10-CM | POA: Diagnosis not present

## 2023-02-07 DIAGNOSIS — Z91040 Latex allergy status: Secondary | ICD-10-CM

## 2023-02-07 DIAGNOSIS — J9811 Atelectasis: Secondary | ICD-10-CM | POA: Diagnosis not present

## 2023-02-07 HISTORY — DX: Anxiety disorder, unspecified: F41.9

## 2023-02-07 HISTORY — DX: Prediabetes: R73.03

## 2023-02-07 HISTORY — DX: Anemia, unspecified: D64.9

## 2023-02-07 HISTORY — DX: Depression, unspecified: F32.A

## 2023-02-07 HISTORY — DX: Essential (primary) hypertension: I10

## 2023-02-07 HISTORY — DX: Unspecified osteoarthritis, unspecified site: M19.90

## 2023-02-07 LAB — CBC WITH DIFFERENTIAL/PLATELET
Abs Immature Granulocytes: 0.04 10*3/uL (ref 0.00–0.07)
Basophils Absolute: 0 10*3/uL (ref 0.0–0.1)
Basophils Relative: 0 %
Eosinophils Absolute: 0 10*3/uL (ref 0.0–0.5)
Eosinophils Relative: 0 %
HCT: 37.8 % (ref 36.0–46.0)
Hemoglobin: 12.1 g/dL (ref 12.0–15.0)
Immature Granulocytes: 0 %
Lymphocytes Relative: 19 %
Lymphs Abs: 2 10*3/uL (ref 0.7–4.0)
MCH: 26.9 pg (ref 26.0–34.0)
MCHC: 32 g/dL (ref 30.0–36.0)
MCV: 84 fL (ref 80.0–100.0)
Monocytes Absolute: 0.6 10*3/uL (ref 0.1–1.0)
Monocytes Relative: 6 %
Neutro Abs: 7.9 10*3/uL — ABNORMAL HIGH (ref 1.7–7.7)
Neutrophils Relative %: 75 %
Platelets: 293 10*3/uL (ref 150–400)
RBC: 4.5 MIL/uL (ref 3.87–5.11)
RDW: 15.7 % — ABNORMAL HIGH (ref 11.5–15.5)
WBC: 10.6 10*3/uL — ABNORMAL HIGH (ref 4.0–10.5)
nRBC: 0 % (ref 0.0–0.2)

## 2023-02-07 LAB — COMPREHENSIVE METABOLIC PANEL
ALT: 39 U/L (ref 0–44)
AST: 33 U/L (ref 15–41)
Albumin: 3.5 g/dL (ref 3.5–5.0)
Alkaline Phosphatase: 70 U/L (ref 38–126)
Anion gap: 9 (ref 5–15)
BUN: 15 mg/dL (ref 6–20)
CO2: 23 mmol/L (ref 22–32)
Calcium: 9 mg/dL (ref 8.9–10.3)
Chloride: 108 mmol/L (ref 98–111)
Creatinine, Ser: 0.89 mg/dL (ref 0.44–1.00)
GFR, Estimated: >60 mL/min (ref >60)
Glucose, Bld: 139 mg/dL — ABNORMAL HIGH (ref 70–99)
Potassium: 3.1 mmol/L — ABNORMAL LOW (ref 3.5–5.1)
Sodium: 140 mmol/L (ref 135–145)
Total Bilirubin: 0.9 mg/dL (ref 0.3–1.2)
Total Protein: 7.3 g/dL (ref 6.5–8.1)

## 2023-02-07 LAB — ECHOCARDIOGRAM COMPLETE
AR max vel: 3.38 cm2
AV Area VTI: 3.66 cm2
AV Area mean vel: 3.07 cm2
AV Mean grad: 2 mmHg
AV Peak grad: 4.3 mmHg
Ao pk vel: 1.04 m/s
Area-P 1/2: 4.89 cm2
Est EF: <20
Height: 62.5 in
MV M vel: 4.63 m/s
MV Peak grad: 85.7 mmHg
S' Lateral: 5.5 cm
Weight: 4123.48 oz

## 2023-02-07 LAB — TROPONIN I (HIGH SENSITIVITY)
Troponin I (High Sensitivity): 29 ng/L — ABNORMAL HIGH (ref <18)
Troponin I (High Sensitivity): 107 ng/L (ref <18)
Troponin I (High Sensitivity): 292 ng/L (ref <18)
Troponin I (High Sensitivity): 457 ng/L (ref <18)
Troponin I (High Sensitivity): 727 ng/L (ref <18)

## 2023-02-07 LAB — GLUCOSE, CAPILLARY: Glucose-Capillary: 95 mg/dL (ref 70–99)

## 2023-02-07 LAB — APTT: aPTT: 41 seconds — ABNORMAL HIGH (ref 24–36)

## 2023-02-07 LAB — RAPID URINE DRUG SCREEN, HOSP PERFORMED
Amphetamines: NOT DETECTED
Barbiturates: NOT DETECTED
Benzodiazepines: NOT DETECTED
Cocaine: NOT DETECTED
Opiates: NOT DETECTED
Tetrahydrocannabinol: NOT DETECTED

## 2023-02-07 LAB — PROTIME-INR
INR: 1.2 (ref 0.8–1.2)
INR: 1.3 — ABNORMAL HIGH (ref 0.8–1.2)
Prothrombin Time: 14.9 seconds (ref 11.4–15.2)
Prothrombin Time: 15.8 seconds — ABNORMAL HIGH (ref 11.4–15.2)

## 2023-02-07 LAB — BRAIN NATRIURETIC PEPTIDE: B Natriuretic Peptide: 1470.5 pg/mL — ABNORMAL HIGH (ref 0.0–100.0)

## 2023-02-07 LAB — TSH: TSH: 1.459 u[IU]/mL (ref 0.350–4.500)

## 2023-02-07 MED ORDER — DOCUSATE SODIUM 100 MG PO CAPS
100.0000 mg | ORAL_CAPSULE | Freq: Two times a day (BID) | ORAL | Status: DC
  Administered 2023-02-07 – 2023-02-10 (×2): 100 mg via ORAL
  Filled 2023-02-07 (×9): qty 1

## 2023-02-07 MED ORDER — IOHEXOL 350 MG/ML SOLN
100.0000 mL | Freq: Once | INTRAVENOUS | Status: AC | PRN
  Administered 2023-02-07: 100 mL via INTRAVENOUS

## 2023-02-07 MED ORDER — SODIUM CHLORIDE 0.9% FLUSH
3.0000 mL | Freq: Two times a day (BID) | INTRAVENOUS | Status: DC
  Administered 2023-02-07 – 2023-02-11 (×6): 3 mL via INTRAVENOUS

## 2023-02-07 MED ORDER — SACUBITRIL-VALSARTAN 24-26 MG PO TABS
1.0000 | ORAL_TABLET | Freq: Two times a day (BID) | ORAL | Status: DC
  Administered 2023-02-07 – 2023-02-12 (×9): 1 via ORAL
  Filled 2023-02-07 (×10): qty 1

## 2023-02-07 MED ORDER — BISACODYL 5 MG PO TBEC: 5.0000 mg | DELAYED_RELEASE_TABLET | Freq: Every day | ORAL | Status: DC | PRN

## 2023-02-07 MED ORDER — HYDRALAZINE HCL 20 MG/ML IJ SOLN: 5.0000 mg | INTRAMUSCULAR | Status: DC | PRN

## 2023-02-07 MED ORDER — INSULIN ASPART 100 UNIT/ML IJ SOLN
0.0000 [IU] | Freq: Three times a day (TID) | INTRAMUSCULAR | Status: DC
  Administered 2023-02-08 (×2): 2 [IU] via SUBCUTANEOUS

## 2023-02-07 MED ORDER — ONDANSETRON HCL 4 MG/2ML IJ SOLN: 4.0000 mg | Freq: Four times a day (QID) | INTRAMUSCULAR | Status: DC | PRN

## 2023-02-07 MED ORDER — POTASSIUM CHLORIDE CRYS ER 20 MEQ PO TBCR
40.0000 meq | EXTENDED_RELEASE_TABLET | Freq: Once | ORAL | Status: AC
  Administered 2023-02-07: 40 meq via ORAL
  Filled 2023-02-07: qty 2

## 2023-02-07 MED ORDER — FUROSEMIDE 10 MG/ML IJ SOLN
40.0000 mg | Freq: Two times a day (BID) | INTRAMUSCULAR | Status: DC
  Administered 2023-02-07 – 2023-02-09 (×4): 40 mg via INTRAVENOUS
  Filled 2023-02-07 (×4): qty 4

## 2023-02-07 MED ORDER — SPIRONOLACTONE 25 MG PO TABS: 25.0000 mg | ORAL_TABLET | Freq: Every day | ORAL | Status: DC

## 2023-02-07 MED ORDER — FUROSEMIDE 10 MG/ML IJ SOLN
40.0000 mg | Freq: Once | INTRAMUSCULAR | Status: AC
  Administered 2023-02-07: 40 mg via INTRAVENOUS
  Filled 2023-02-07: qty 4

## 2023-02-07 MED ORDER — CARVEDILOL 6.25 MG PO TABS: 6.2500 mg | ORAL_TABLET | Freq: Two times a day (BID) | ORAL | Status: DC

## 2023-02-07 MED ORDER — ONDANSETRON HCL 4 MG PO TABS: 4.0000 mg | ORAL_TABLET | Freq: Four times a day (QID) | ORAL | Status: DC | PRN

## 2023-02-07 MED ORDER — POLYETHYLENE GLYCOL 3350 17 G PO PACK: 17.0000 g | PACK | Freq: Every day | ORAL | Status: DC | PRN

## 2023-02-07 MED ORDER — SPIRONOLACTONE 25 MG PO TABS
25.0000 mg | ORAL_TABLET | Freq: Every day | ORAL | Status: DC
  Administered 2023-02-07 – 2023-02-12 (×6): 25 mg via ORAL
  Filled 2023-02-07 (×6): qty 1

## 2023-02-07 MED ORDER — HEPARIN (PORCINE) 25000 UT/250ML-% IV SOLN
1850.0000 [IU]/h | INTRAVENOUS | Status: DC
  Administered 2023-02-07: 950 [IU]/h via INTRAVENOUS
  Administered 2023-02-08: 1450 [IU]/h via INTRAVENOUS
  Administered 2023-02-09 (×2): 1700 [IU]/h via INTRAVENOUS
  Administered 2023-02-10: 1850 [IU]/h via INTRAVENOUS
  Filled 2023-02-07 (×5): qty 250

## 2023-02-07 MED ORDER — INSULIN ASPART 100 UNIT/ML IJ SOLN: 0.0000 [IU] | Freq: Every day | INTRAMUSCULAR | Status: DC

## 2023-02-07 MED ORDER — HEPARIN BOLUS VIA INFUSION
4000.0000 [IU] | Freq: Once | INTRAVENOUS | Status: AC
  Administered 2023-02-07: 4000 [IU] via INTRAVENOUS
  Filled 2023-02-07: qty 4000

## 2023-02-07 MED ORDER — LORATADINE 10 MG PO TABS
10.0000 mg | ORAL_TABLET | Freq: Every day | ORAL | Status: DC
  Administered 2023-02-08 – 2023-02-12 (×5): 10 mg via ORAL
  Filled 2023-02-07 (×5): qty 1

## 2023-02-07 MED ORDER — TRAZODONE HCL 50 MG PO TABS: 25.0000 mg | ORAL_TABLET | Freq: Every evening | ORAL | Status: DC | PRN

## 2023-02-07 NOTE — H&P (Signed)
History and Physical    Patient: Stephanie Reilly ZOX:[id number] DOB: 31-Oct-1970 DOA: 02/07/2023 DOS: the patient was seen and examined on 02/07/2023 PCP: [facility]  Patient coming from: Home - lives with mother; NOK: Daughter, Stephanie Reilly, [phone]   Chief Complaint: SOB  HPI: Stephanie Reilly is a 52 y.o. female with medical history significant of anxiety/depression presenting with CP/SOB.  Last year, she had "the crud" and she went to the ER in [place], was given prednisone.  She had lots of side effects from prednisone - SOB, fatigue, mood swings, insomnia.  In March, she saw her knee doctor and got steroid injections in both knees and her symptoms recurred and even worse.  She has had ongoing symptoms and her PCP wasn't listening to her.  From then on, since March and worse into April, she has had progressive DOE.  Would have to lean against the counter in the kitchen after walking there in order to catch her breath.  LE edema  days ago.  This AM, she awoke with left-sided CP - started under her left arm and moved to substernal with dizziness, nausea.  She has not had further chest discomfort since starting the Lasix.  She is peeing like crazy, >2 liters.  She feels "lovely' now.  "I told them I had hypertension and they told me I had white coat syndrome."      ER Course:  [facility] to [facility] transfer:  She reported some chest discomfort this AM, has had DOE for a few weeks, LE the last few days. Not on BP meds because she has had "white coat hypertension". Chest CT without PE. Mild troponin leak, BNP 1500. Likely needs evaluation for CHF. On room air. Very SOB with ambulation.      Review of Systems: As mentioned in the history of present illness. All other systems reviewed and are negative. Past Medical History:  Diagnosis Date   Anemia    Anxiety    Arthritis    Depression    Hypertension    Prediabetes    Past Surgical History:  Procedure Laterality Date    ABDOMINAL HYSTERECTOMY     TONSILLECTOMY     TUBAL LIGATION     Social History:  reports that she quit smoking about 16 months ago. Her smoking use included cigarettes. She has a 5.50 pack-year smoking history. She has never used smokeless tobacco. She reports current drug use. Drug: Marijuana. She reports that she does not drink alcohol.  Allergies  Allergen Reactions   Paroxetine Hcl Anaphylaxis   Prednisone Shortness Of Breath and Other (See Comments)    Confusion, "messes with mental," muscles feel weird, fatigue, insomnia   Chocolate    Ibuprofen    Iron Other (See Comments)   Latex    Lisinopril Hives   Naproxen    Tramadol Hives   Tylenol [Acetaminophen] Other (See Comments)    With Codeine; GI    Penicillins Rash   Serotonin Nausea Only, Swelling, Anxiety, Rash and Other (See Comments)    GI intolerence    Family History  Problem Relation Age of Onset   Hypertension Mother    Heart failure Mother    COPD Mother    Diabetes Paternal Grandfather     Prior to Admission medications   Medication Sig Start Date End Date Taking? Authorizing Provider  ascorbic acid (VITAMIN C) 100 MG tablet Take 100 mg by mouth daily. 08/04/17  Yes [provider]  Biotin (BIOTIN  MAXIMUM STRENGTH) 10 MG TABS Take 10 mg by mouth daily. 08/04/17  Yes [provider]  cetirizine (ZYRTEC) 10 MG tablet Take 10 mg by mouth daily.   Yes [provider]  Vitamin E 45 MG (100 UNIT) CAPS Take 100 Units by mouth daily. 08/04/17  Yes [provider]  lidocaine (LIDODERM) 5 % Place 1 patch onto the skin daily. Remove & Discard patch within 12 hours Patient not taking: Reported on 02/07/2023 08/28/18   Lenda Kelp, MD    Physical Exam: Vitals:   02/07/23 1400 02/07/23 1415 02/07/23 1430 02/07/23 1554  BP: (!) 142/110  (!) 144/110 (!) 141/113  Pulse: (!) 102 100 100 (!) 115  Resp: (!) 21 (!) 26 20   Temp:    98.9 F (37.2 C)  TempSrc:    Oral  SpO2: 91% 93%  93% 97%  Weight:    116.9 kg  Height:    5' 2.5" (1.588 m)   General:  Appears calm and comfortable and is in NAD, sitting on the side of the bed on RA in NAD Eyes:  EOMI, normal lids, iris ENT:  grossly normal hearing, lips & tongue, mmm Neck:  no LAD, masses or thyromegaly Cardiovascular:  RR with mild tachycardia, no m/r/g. 1+ LE edema.  Respiratory:   CTA bilaterally with no wheezes/rales/rhonchi.  Normal  to mildly increased respiratory effort, on RA. Abdomen:  soft, NT, ND, obese Skin:  no rash or induration seen on limited exam Musculoskeletal:  grossly normal tone BUE/BLE, good ROM, no bony abnormality Psychiatric:  grossly normal mood and affect, speech fluent and appropriate, AOx3 Neurologic:  CN 2-12 grossly intact, moves all extremities in coordinated fashion   Radiological Exams on Admission: Independently reviewed - see discussion in A/P where applicable  ECHOCARDIOGRAM COMPLETE  Result Date: 02/07/2023    ECHOCARDIOGRAM REPORT   Patient Name:   Stephanie Reilly Date of Exam: 02/07/2023 Medical Rec #:  [id number]     Height:       62.5 in Accession #:    [id number]    Weight:       257.7 lb Date of Birth:  Jan 23, 1971     BSA:          2.142 m Patient Age:    51 years      BP:           141/113 mmHg Patient Gender: F             HR:           114 bpm. Exam Location:  Inpatient Procedure: 2D Echo, 3D Echo, Cardiac Doppler, Color Doppler and Strain Analysis Indications:    Congestive Heart Failure I50.9  History:        Patient has no prior history of Echocardiogram examinations.                 CHF; Risk Factors:Former Smoker, Hypertension and Diabetes.  Sonographer:    Dondra Prader RVT RCS Referring Phys: [id number] Prue Lingenfelter IMPRESSIONS  1. Left ventricular ejection fraction, by estimation, is <20%. The left ventricle has severely decreased function. The left ventricle demonstrates global hypokinesis. The left ventricular internal cavity size was severely dilated. Left ventricular  diastolic parameters are consistent with Grade III diastolic dysfunction (restrictive). The average left ventricular global longitudinal strain is -4.8 %. The global longitudinal strain is abnormal.  2. Right ventricular systolic function is mildly reduced. The right ventricular size is normal.  3. Left atrial  size was severely dilated.  4. The mitral valve is normal in structure. Mild mitral valve regurgitation. No evidence of mitral stenosis.  5. The aortic valve is tricuspid. Aortic valve regurgitation is not visualized. No aortic stenosis is present.  6. The inferior vena cava is dilated in size with <50% respiratory variability, suggesting right atrial pressure of 15 mmHg. Comparison(s): No prior Echocardiogram. FINDINGS  Left Ventricle: Left ventricular ejection fraction, by estimation, is <20%. The left ventricle has severely decreased function. The left ventricle demonstrates global hypokinesis. The average left ventricular global longitudinal strain is -4.8 %. The global longitudinal strain is abnormal. The left ventricular internal cavity size was severely dilated. There is no left ventricular hypertrophy. Left ventricular diastolic parameters are consistent with Grade III diastolic dysfunction (restrictive). Right Ventricle: The right ventricular size is normal. Right ventricular systolic function is mildly reduced. Left Atrium: Left atrial size was severely dilated. Right Atrium: Right atrial size was normal in size. Pericardium: Trivial pericardial effusion is present. Mitral Valve: The mitral valve is normal in structure. Mild mitral valve regurgitation. No evidence of mitral valve stenosis. Tricuspid Valve: The tricuspid valve is normal in structure. Tricuspid valve regurgitation is mild . No evidence of tricuspid stenosis. Aortic Valve: The aortic valve is tricuspid. Aortic valve regurgitation is not visualized. No aortic stenosis is present. Aortic valve mean gradient measures 2.0 mmHg. Aortic valve  peak gradient measures 4.3 mmHg. Aortic valve area, by VTI measures 3.66 cm. Pulmonic Valve: The pulmonic valve was normal in structure. Pulmonic valve regurgitation is trivial. No evidence of pulmonic stenosis. Aorta: The aortic root is normal in size and structure. Venous: The inferior vena cava is dilated in size with less than 50% respiratory variability, suggesting right atrial pressure of 15 mmHg. IAS/Shunts: There is right bowing of the interatrial septum, suggestive of elevated left atrial pressure. No atrial level shunt detected by color flow Doppler.  LEFT VENTRICLE PLAX 2D LVIDd:         6.40 cm LVIDs:         5.50 cm   2D Longitudinal Strain LV PW:         1.20 cm   2D Strain GLS Avg:     -4.8 % LV IVS:        1.00 cm LVOT diam:     2.00 cm LV SV:         51 LV SV Index:   24        3D Volume EF: LVOT Area:     3.14 cm  3D EF:        29 %                          LV EDV:       266 ml                          LV ESV:       188 ml                          LV SV:        78 ml RIGHT VENTRICLE             IVC RV Basal diam:  3.30 cm     IVC diam: 2.50 cm RV Mid diam:    2.50 cm RV S prime:     11.60 cm/s  TAPSE (M-mode): 2.1 cm LEFT ATRIUM              Index        RIGHT ATRIUM           Index LA diam:        5.40 cm  2.52 cm/m   RA Area:     21.30 cm LA Vol (A2C):   126.0 ml 58.83 ml/m  RA Volume:   63.90 ml  29.83 ml/m LA Vol (A4C):   74.8 ml  34.92 ml/m LA Biplane Vol: 98.6 ml  46.03 ml/m  AORTIC VALVE                    PULMONIC VALVE AV Area (Vmax):    3.38 cm     PV Vmax:       0.75 m/s AV Area (Vmean):   3.07 cm     PV Peak grad:  2.3 mmHg AV Area (VTI):     3.66 cm AV Vmax:           104.00 cm/s AV Vmean:          71.400 cm/s AV VTI:            0.140 m AV Peak Grad:      4.3 mmHg AV Mean Grad:      2.0 mmHg LVOT Vmax:         112.00 cm/s LVOT Vmean:        69.800 cm/s LVOT VTI:          0.163 m LVOT/AV VTI ratio: 1.16  AORTA Ao Root diam: 3.10 cm Ao Asc diam:  3.50 cm Ao Arch diam: 2.8 cm  MITRAL VALVE                TRICUSPID VALVE MV Area (PHT): 4.89 cm     TR Peak grad:   29.8 mmHg MV Decel Time: 155 msec     TR Vmax:        273.00 cm/s MR Peak grad: 85.7 mmHg MR Mean grad: 58.0 mmHg     SHUNTS MR Vmax:      463.00 cm/s   Systemic VTI:  0.16 m MR Vmean:     355.0 cm/s    Systemic Diam: 2.00 cm MV E velocity: 120.00 cm/s Olga Millers MD Electronically signed by Olga Millers MD Signature Date/Time: 02/07/2023/5:19:03 PM    Final    CT Angio Chest PE W and/or Wo Contrast  Result Date: 02/07/2023 CLINICAL DATA:  Chest pain. EXAM: CT ANGIOGRAPHY CHEST WITH CONTRAST TECHNIQUE: Multidetector CT imaging of the chest was performed using the standard protocol during bolus administration of intravenous contrast. Multiplanar CT image reconstructions and MIPs were obtained to evaluate the vascular anatomy. RADIATION DOSE REDUCTION: This exam was performed according to the departmental dose-optimization program which includes automated exposure control, adjustment of the mA and/or kV according to patient size and/or use of iterative reconstruction technique. CONTRAST:  OMNIPAQUE IOHEXOL 350 MG/ML SOLN COMPARISON:  None Available. FINDINGS: Cardiovascular: Satisfactory opacification of the pulmonary arteries to the segmental level. No evidence of pulmonary embolism. Moderate cardiomegaly is noted. No pericardial effusion. Mediastinum/Nodes: No enlarged mediastinal, hilar, or axillary lymph nodes. Thyroid gland, trachea, and esophagus demonstrate no significant findings. Lungs/Pleura: No pneumothorax is noted. Small right pleural effusion is noted with minimal adjacent subsegmental atelectasis. Minimal left basilar subsegmental atelectasis is noted. Upper Abdomen: No acute abnormality. Musculoskeletal: No chest wall abnormality. No acute or significant  osseous findings. Review of the MIP images confirms the above findings. IMPRESSION: No definite evidence of pulmonary embolus. Moderate cardiomegaly.  Small right pleural effusion. Minimal bibasilar subsegmental atelectasis. Electronically Signed   By: Lupita Raider M.D.   On: 02/07/2023 11:30   DG Chest Portable 1 View  Result Date: 02/07/2023 CLINICAL DATA:  Chest pain EXAM: PORTABLE CHEST 1 VIEW COMPARISON:  X-ray 07/29/2022 FINDINGS: Enlarged cardiopericardial silhouette with slight central vascular congestion. No pneumothorax, effusion or consolidation. Film is under penetrated. IMPRESSION: Under penetrated radiograph. Enlarged heart with central vascular congestion Electronically Signed   By: Karen Kays M.D.   On: 02/07/2023 09:47    EKG: Independently reviewed.   1610 - Sinus tachycardia with rate 115; nonspecific ST changes with no evidence of acute ischemia 0942 - Sinus tachycardia with rate 104; LAFB with ?Q waves, nonspecific ST changes    Labs on Admission: I have personally reviewed the available labs and imaging studies at the time of the admission.  Pertinent labs:    K+ 3.1 Glucose 139 BNP 1470.5 HS troponin 29, 107, 292 WBC 10.6 A1c 6.3 on 11/13/22 Lipids on 11/13/22: 177/67/94/86 Normal LFTs on 11/13/22   Assessment and Plan: Principal Problem:   New onset of congestive heart failure (HCC) Active Problems:   Essential hypertension   Diabetes mellitus type 2, diet-controlled (HCC)   Elevated troponin   Obesity, Class III, BMI 40-49.9 (morbid obesity) (HCC)    New Onset CHF -Patient presenting with worsening SOB and edema -CXR consistent with mild pulmonary edema -Elevated BNP without known baseline -With elevated BNP and abnl CXR, acute decompensated CHF seems probable as diagnosis -Will place in observation status with telemetry, as there are no current findings necessitating admission (hemodynamic instability, severe electrolyte abnormalities, cardiac arrhythmias, ACS, severe pulmonary edema requiring new O2 therapy, AMS) -Will request STAT echocardiogram (increasing troponin, see below) -Will start  Hepairn -ACE/ARB, beta blocker, and spironolactone are recommended as per guideline-directed medical therapy to reduce morbidity/mortality - reports anaphylactic allergy to lisinopril but consider starting low-dose coreg and spironolactone -CHF order set utilized -Cardiology consulted -Was given Lasix 40 mg x 1 in ER and will repeat with 40 mg IV BID -prn Waller O2 for now -Normal kidney function at this time, will follow  Elevated troponin -Patient did report chest pain on presentation, although it has resolved with diuresis -Ongoing troponin escalation, will continue to follow -Likely demand ischemia in the setting of new-onset ACS but this is not conclusive at this time -Will order STAT echo -Will start heparin -Cardiology consult is pending  HTN -Previously diagnosed but treated once with lisinopril and then not again after she took 2 doses and had a negative reaction -Will start carvedilol -Will also add prn hydralazine  DM -reports she was told she is "borderline diabetic" -Diet controlled -Last A1c was 6.3 - she is diabetic -There is no indication to start medication at this time, but will cover with SSI for now  Morbid Obesity -Body mass index is 46.39 kg/m..  -Weight loss should be encouraged -Outpatient PCP/bariatric medicine/bariatric surgery f/u encouraged  -She reports having an excellent diet and that she knows exactly what and how she is supposed to eat -Will order nutrition consult      Advance Care Planning:   Code Status: Full Code - Code status was discussed with the patient at the time of admission.  The patient would want to receive full resuscitative measures at this time.   Consults: Cardiology; CHF navigator,  nutrition, PT/OT  DVT Prophylaxis: Heparin drip  Family Communication: None present; she declined to have me call her sister at the time of admission  Severity of Illness: The appropriate patient status for this patient is OBSERVATION.  Observation status is judged to be reasonable and necessary in order to provide the required intensity of service to ensure the patient's safety. The patient's presenting symptoms, physical exam findings, and initial radiographic and laboratory data in the context of their medical condition is felt to place them at decreased risk for further clinical deterioration. Furthermore, it is anticipated that the patient will be medically stable for discharge from the hospital within 2 midnights of admission.   Author: Jonah Blue, MD 02/07/2023 5:50 PM  For on call review [email or web address].

## 2023-02-07 NOTE — ED Notes (Signed)
Spoke with Asher Muir @ [facility] regarding ready bed for admission

## 2023-02-07 NOTE — Progress Notes (Signed)
ANTICOAGULATION CONSULT NOTE - Initial Consult  Pharmacy Consult for Heparin Indication: chest pain/ACS  Allergies  Allergen Reactions   Paroxetine Hcl Anaphylaxis   Prednisone Shortness Of Breath and Other (See Comments)    Confusion, "messes with mental," muscles feel weird, fatigue, insomnia   Chocolate    Ibuprofen    Iron Other (See Comments)   Latex    Lisinopril Hives   Naproxen    Tramadol Hives   Tylenol [Acetaminophen] Other (See Comments)    With Codeine; GI    Penicillins Rash   Serotonin Nausea Only, Swelling, Anxiety, Rash and Other (See Comments)    GI intolerence    Patient Measurements: Height: 5' 2.5" (158.8 cm) Weight: 116.9 kg (257 lb 11.5 oz) IBW/kg (Calculated) : 51.25 Heparin Dosing Weight: 79.9 kg  Vital Signs: Temp: 98.9 F (37.2 C) (05/03 1554) Temp Source: Oral (05/03 1554) BP: 141/113 (05/03 1554) Pulse Rate: 115 (05/03 1554)  Labs: Recent Labs    02/07/23 0947 02/07/23 1157 02/07/23 1441  HGB 12.1  --   --   HCT 37.8  --   --   PLT 293  --   --   CREATININE 0.89  --   --   TROPONINIHS 29* 107* 292*    Estimated Creatinine Clearance: 89.4 mL/min (by C-G formula based on SCr of 0.89 mg/dL).   Medical History: Past Medical History:  Diagnosis Date   Anxiety    Arthritis    Depression     Medications:  Scheduled:  Infusions:  PRN:   Assessment: 52 yo female presents with chest pain. Pharmacy consulted to dose IV heparin. Troponins elevated, EKG with non-specific ST changes. No anticoagulants PTA, CBC wnl.  Goal of Therapy:  Heparin level 0.3-0.7 units/ml Monitor platelets by anticoagulation protocol: Yes   Plan:  Give 4000 units bolus x 1 Start heparin infusion at 950 units/hr Check anti-Xa level in 6 hours and daily while on heparin Continue to monitor H&H and platelets  Loralee Pacas, PharmD, BCPS Please see amion for complete clinical pharmacist phone list 02/07/2023,4:13 PM

## 2023-02-07 NOTE — ED Triage Notes (Signed)
Pt reports left-sided chest pain that started this morning. Also reports SOB on exertion.

## 2023-02-07 NOTE — ED Provider Notes (Addendum)
[facility] Provider Note   CSN: [id number] Arrival date & time: 02/07/23  8657     History  Chief Complaint  Patient presents with   Chest Pain   Shortness of Breath    Stephanie Reilly is a 52 y.o. female.  Patient woke up with left-sided chest discomfort.  Denies any recent surgery or travel.  No cardiac history.  No significant medical history.  She has not had any discomfort like this in the past.  Feels like she had a lot of pollen exposure and she is been coughing a lot but denies any fever or sputum production.  She denies any weakness, numbness, tingling.  She has abdominal/umbilical hernia that causes her discomfort at times but otherwise she has no significant medical problems.  She has noticed some shortness of breath with exertion.  She has noticed some shortness of breath with exertion for a few weeks.  Leg swelling for the last few days.  Chest pain only today.  The history is provided by the patient.       Home Medications Prior to Admission medications   Medication Sig Start Date End Date Taking? Authorizing Provider  lidocaine (LIDODERM) 5 % Place 1 patch onto the skin daily. Remove & Discard patch within 12 hours 08/28/18   Hudnall, Azucena Fallen, MD      Allergies    Chocolate, Ibuprofen, Iron, Latex, Lisinopril, Naproxen, Penicillins, Tramadol, and Tylenol [acetaminophen]    Review of Systems   Review of Systems  Physical Exam Updated Vital Signs BP (!) 145/108   Pulse (!) 112   Temp 99.5 F (37.5 C) (Oral)   Resp (!) 26   SpO2 95%  Physical Exam Vitals and nursing note reviewed.  Constitutional:      General: She is not in acute distress.    Appearance: She is well-developed. She is not ill-appearing.  HENT:     Head: Normocephalic and atraumatic.  Eyes:     Extraocular Movements: Extraocular movements intact.     Conjunctiva/sclera: Conjunctivae normal.     Pupils: Pupils are equal, round, and reactive  to light.  Cardiovascular:     Rate and Rhythm: Normal rate and regular rhythm.     Pulses:          Radial pulses are 2+ on the right side and 2+ on the left side.     Heart sounds: Normal heart sounds. No murmur heard. Pulmonary:     Effort: Pulmonary effort is normal. No respiratory distress.     Breath sounds: Decreased breath sounds present. No wheezing.  Abdominal:     Palpations: Abdomen is soft.     Tenderness: There is no abdominal tenderness.  Musculoskeletal:        General: No swelling.     Cervical back: Normal range of motion and neck supple.     Right lower leg: Edema present.     Left lower leg: Edema present.  Skin:    General: Skin is warm and dry.     Capillary Refill: Capillary refill takes less than 2 seconds.  Neurological:     Mental Status: She is alert.  Psychiatric:        Mood and Affect: Mood normal.     ED Results / Procedures / Treatments   Labs (all labs ordered are listed, but only abnormal results are displayed) Labs Reviewed  CBC WITH DIFFERENTIAL/PLATELET - Abnormal; Notable for the following components:  Result Value   WBC 10.6 (*)    RDW 15.7 (*)    Neutro Abs 7.9 (*)    All other components within normal limits  COMPREHENSIVE METABOLIC PANEL - Abnormal; Notable for the following components:   Potassium 3.1 (*)    Glucose, Bld 139 (*)    All other components within normal limits  BRAIN NATRIURETIC PEPTIDE - Abnormal; Notable for the following components:   B Natriuretic Peptide 1,470.5 (*)    All other components within normal limits  TROPONIN I (HIGH SENSITIVITY) - Abnormal; Notable for the following components:   Troponin I (High Sensitivity) 29 (*)    All other components within normal limits  TROPONIN I (HIGH SENSITIVITY)    EKG EKG Interpretation  Date/Time:  Friday Feb 07 2023 09:42:38 EDT Ventricular Rate:  115 PR Interval:  158 QRS Duration: 102 QT Interval:  324 QTC Calculation: 449 R Axis:   -50 Text  Interpretation: Sinus tachycardia Ventricular premature complex Confirmed by Virgina Norfolk ([id number]) on 02/07/2023 9:51:08 AM  Radiology CT Angio Chest PE W and/or Wo Contrast  Result Date: 02/07/2023 CLINICAL DATA:  Chest pain. EXAM: CT ANGIOGRAPHY CHEST WITH CONTRAST TECHNIQUE: Multidetector CT imaging of the chest was performed using the standard protocol during bolus administration of intravenous contrast. Multiplanar CT image reconstructions and MIPs were obtained to evaluate the vascular anatomy. RADIATION DOSE REDUCTION: This exam was performed according to the departmental dose-optimization program which includes automated exposure control, adjustment of the mA and/or kV according to patient size and/or use of iterative reconstruction technique. CONTRAST:  OMNIPAQUE IOHEXOL 350 MG/ML SOLN COMPARISON:  None Available. FINDINGS: Cardiovascular: Satisfactory opacification of the pulmonary arteries to the segmental level. No evidence of pulmonary embolism. Moderate cardiomegaly is noted. No pericardial effusion. Mediastinum/Nodes: No enlarged mediastinal, hilar, or axillary lymph nodes. Thyroid gland, trachea, and esophagus demonstrate no significant findings. Lungs/Pleura: No pneumothorax is noted. Small right pleural effusion is noted with minimal adjacent subsegmental atelectasis. Minimal left basilar subsegmental atelectasis is noted. Upper Abdomen: No acute abnormality. Musculoskeletal: No chest wall abnormality. No acute or significant osseous findings. Review of the MIP images confirms the above findings. IMPRESSION: No definite evidence of pulmonary embolus. Moderate cardiomegaly. Small right pleural effusion. Minimal bibasilar subsegmental atelectasis. Electronically Signed   By: Lupita Raider M.D.   On: 02/07/2023 11:30   DG Chest Portable 1 View  Result Date: 02/07/2023 CLINICAL DATA:  Chest pain EXAM: PORTABLE CHEST 1 VIEW COMPARISON:  X-ray 07/29/2022 FINDINGS: Enlarged cardiopericardial  silhouette with slight central vascular congestion. No pneumothorax, effusion or consolidation. Film is under penetrated. IMPRESSION: Under penetrated radiograph. Enlarged heart with central vascular congestion Electronically Signed   By: Karen Kays M.D.   On: 02/07/2023 09:47    Procedures Procedures    Medications Ordered in ED Medications  iohexol (OMNIPAQUE) 350 MG/ML injection 100 mL (100 mLs Intravenous Contrast Given 02/07/23 1106)  furosemide (LASIX) injection 40 mg (40 mg Intravenous Given 02/07/23 1121)    ED Course/ Medical Decision Making/ A&P                             Medical Decision Making Amount and/or Complexity of Data Reviewed Labs: ordered. Radiology: ordered.  Risk Prescription drug management. Decision regarding hospitalization.   Stephanie Reilly is here with chest pain, SOB.  Unremarkable vitals.  Mildly tachycardic.  EKG shows sinus tachycardia.  No ischemic changes.  Differential diagnosis seems may be volume  overload versus less likely ACS, PE, infectious process.  Will get CBC, CMP, BNP, troponin, chest x-ray.  BNP about 1400, troponin 29.  Chest x-ray with signs of volume overload.  I reviewed interpreted labs and imaging.  Lab work otherwise unremarkable.  Will get a CT scan of her chest given tachycardia to further evaluate for PE.  Anticipate admission for what I suspect is likely new heart failure.  Will give a dose IV Lasix.  Seems less likely to be ACS.  Will continue to trend troponin.  CT scan shows no evidence of PE.  There is some effusion.  Overall we will admit for further cardiac workup to medicine.  Hemodynamically stable.  Repeat troponin up to 107.  Is not having any chest pain.  Talked with cardiology team.  Hold heparin at this time.  Continue to trend troponin.  Picture still seems to be likely volume overload from heart failure.  Continue with plan for admission and echocardiogram.  If she starts having more significant pain or significant  elevation of troponin consider starting heparin.  This chart was dictated using voice recognition software.  Despite best efforts to proofread,  errors can occur which can change the documentation meaning.         Final Clinical Impression(s) / ED Diagnoses Final diagnoses:  SOB (shortness of breath)  Hypervolemia, unspecified hypervolemia type    Rx / DC Orders ED Discharge Orders     None         Virgina Norfolk, DO 02/07/23 1138    Declin Rajan, DO 02/07/23 1301

## 2023-02-07 NOTE — ED Notes (Signed)
Pt is alert and oriented. Gave verbal consent for transfer to [facility]

## 2023-02-07 NOTE — ED Notes (Addendum)
Attempted report @ 1320. RN unable to take report at this time. Will follow up.

## 2023-02-07 NOTE — Consult Note (Signed)
CARDIOLOGY CONSULT NOTE  Patient ID: Stephanie Reilly MRN: [id number] DOB/AGE: September 22, 1971 52 y.o.  Admit date: 02/07/2023 [facility] Primary Cardiologist NA Chief Complaint   Requesting  Dr. Ophelia Charter  HPI:   The patient has no past cardiac history.  She presented to the ED with chest pain and SOB.  She said that she has been getting progressively more short of breath at least since March.  She thinks things started when she had possibly a viral illness in October.  Earlier this year she had to get some injections in her knees with steroids and she thinks she started really getting short of breath after this.  She has had some recent mild lower extremity swelling.  She is describing shortness of breath walking few yards to the mailbox.  She is not describing resting shortness of breath with PND and orthopnea.  She came to the emergency room because she woke up with sudden tightness in the mid chest.  She did not have radiation to her jaw or to her arms.  She was not feeling any palpitations.  She did not have any nausea vomiting or diaphoresis.  In the emergency room she said this discomfort did improve after she got IV Lasix to begin diuresis.  She has not had any prior cardiac history.  Prior to October she was doing well.  She is found to newly diagnosed cardiomyopathy with an EF of 20% and global hypokinesis.  BNP is 1470.  She has a slow increase in troponins.  CT scan of her chest did not suggest pulmonary emboli but there was small pleural effusions.  Chest x-ray demonstrated possible cardiomegaly with some vascular congestion.  EKG is as below.  Past Medical History:  Diagnosis Date   Anemia    Anxiety    Arthritis    Depression    Hypertension    Prediabetes     Past Surgical History:  Procedure Laterality Date   ABDOMINAL HYSTERECTOMY     TONSILLECTOMY     TUBAL LIGATION      Allergies  Allergen Reactions   Paroxetine Hcl  Anaphylaxis   Prednisone Shortness Of Breath and Other (See Comments)    Confusion, "messes with mental," muscles feel weird, fatigue, insomnia   Chocolate    Ibuprofen    Iron Other (See Comments)   Latex    Lisinopril Hives   Naproxen    Tramadol Hives   Tylenol [Acetaminophen] Other (See Comments)    With Codeine; GI    Penicillins Rash   Serotonin Nausea Only, Swelling, Anxiety, Rash and Other (See Comments)    GI intolerence   Medications Prior to Admission  Medication Sig Dispense Refill Last Dose   ascorbic acid (VITAMIN C) 100 MG tablet Take 100 mg by mouth daily.   02/06/2023   Biotin (BIOTIN MAXIMUM STRENGTH) 10 MG TABS Take 10 mg by mouth daily.   02/06/2023   cetirizine (ZYRTEC) 10 MG tablet Take 10 mg by mouth daily.   02/06/2023   Vitamin E 45 MG (100 UNIT) CAPS Take 100 Units by mouth daily.   02/06/2023   Family History  Problem Relation Age of Onset   Hypertension Mother    Heart failure Mother    COPD Mother    Diabetes Paternal Grandfather     Social History   Socioeconomic History   Marital status: Single    Spouse name: Not on file   Number of children: Not  on file   Years of education: Not on file   Highest education level: Not on file  Occupational History   Occupation: unemployed  Tobacco Use   Smoking status: Former    Packs/day: 0.50    Years: 11.00    Additional pack years: 0.00    Total pack years: 5.50    Types: Cigarettes    Quit date: 10/2021    Years since quitting: 1.3   Smokeless tobacco: Never  Substance and Sexual Activity   Alcohol use: Never   Drug use: Yes    Types: Marijuana    Comment: routine use of CBD edibles   Sexual activity: Not on file  Other Topics Concern   Not on file  Social History Narrative   Not on file   Social Determinants of Health   Financial Resource Strain: Not on file  Food Insecurity: Not on file  Transportation Needs: Not on file  Physical Activity: Not on file  Stress: Not on file  Social  Connections: Not on file  Intimate Partner Violence: Not on file     ROS:    As stated in the HPI and negative for all other systems.  Physical Exam: Blood pressure (!) 141/113, pulse (!) 115, temperature 98.9 F (37.2 C), temperature source Oral, resp. rate 20, height 5' 2.5" (1.588 m), weight 116.9 kg, SpO2 97 %.  GENERAL:  Well appearing HEENT:  Pupils equal round and reactive, fundi not visualized, oral mucosa unremarkable NECK:  No jugular venous distention, waveform within normal limits, carotid upstroke brisk and symmetric, no bruits, no thyromegaly LYMPHATICS:  No cervical, inguinal adenopathy LUNGS:  Clear to auscultation bilaterally BACK:  No CVA tenderness CHEST:  Unremarkable HEART:  PMI not displaced or sustained,S1 and S2 within normal limits, no S3, no S4, no clicks, no rubs, no murmurs ABD:  Flat, positive bowel sounds normal in frequency in pitch, no bruits, no rebound, no guarding, no midline pulsatile mass, no hepatomegaly, no splenomegaly EXT:  2 plus pulses throughout, no edema, no cyanosis no clubbing SKIN:  No rashes no nodules NEURO:  Cranial nerves II through XII grossly intact, motor grossly intact throughout PSYCH:  Cognitively intact, oriented to person place and time   Lab Results  Component Value Date   BUN 15 02/07/2023   Lab Results  Component Value Date   CREATININE 0.89 02/07/2023   Lab Results  Component Value Date   NA 140 02/07/2023   K 3.1 (L) 02/07/2023   CL 108 02/07/2023   CO2 23 02/07/2023   No results found for: "TROPONINI" Lab Results  Component Value Date   WBC 10.6 (H) 02/07/2023   HGB 12.1 02/07/2023   HCT 37.8 02/07/2023   MCV 84.0 02/07/2023   PLT 293 02/07/2023   No results found for: "CHOL", "HDL", "LDLCALC", "LDLDIRECT", "TRIG", "CHOLHDL" Lab Results  Component Value Date   ALT 39 02/07/2023   AST 33 02/07/2023   ALKPHOS 70 02/07/2023   BILITOT 0.9 02/07/2023   ECHO:   1. Left ventricular ejection fraction,  by estimation, is <20%. The left  ventricle has severely decreased function. The left ventricle demonstrates  global hypokinesis. The left ventricular internal cavity size was severely  dilated. Left ventricular  diastolic parameters are consistent with Grade III diastolic dysfunction  (restrictive). The average left ventricular global longitudinal strain is  -4.8 %. The global longitudinal strain is abnormal.   2. Right ventricular systolic function is mildly reduced. The right  ventricular size is  normal.   3. Left atrial size was severely dilated.   4. The mitral valve is normal in structure. Mild mitral valve  regurgitation. No evidence of mitral stenosis.   5. The aortic valve is tricuspid. Aortic valve regurgitation is not  visualized. No aortic stenosis is present.   6. The inferior vena cava is dilated in size with <50% respiratory  variability, suggesting right atrial pressure of 15 mmHg.   Radiology:   CXR: Underpenetrated with possible cardiomegaly and vascular congestion  EKG: Sinus tachycardia, rate 104, left axis deviation, left ventricular hypertrophy, nonspecific T wave changes with QT prolongation  ASSESSMENT AND PLAN:   Acute systolic heart failure: Echo demonstrates the above findings including severely dilated left atrium.  Agree with plans for IV diuresis.  Blood pressure will allow for med titration begin with Entresto and Coreg.  Plan Farxiga and spironolactone before discharge.   I put on the board for right and left heart cath given the elevated troponins. Agree with routine labs including HIV and TSH.  If she has no obstructive coronary disease we should have a low threshold to look for infiltrative causes of her cardiomyopathy..   Elevated troponin: Will begin aspirin heparin.    Hypertension: This will be managed in the context of treating her cardiomyopathy.    SignedRollene Rotunda 02/07/2023, 5:23 PM

## 2023-02-07 NOTE — Progress Notes (Signed)
Plan of Care Note for accepted transfer   Patient: Kayla Bettner MRN: [id number]   DOA: 02/07/2023  Facility requesting transfer: [facility] Requesting Provider: Lockie Mola Reason for transfer: Chest pain  Facility course: Patient with h/o anxiety/depression presenting with CP/SOB.  She reported some chest discomfort this AM, has had DOE for a few weeks, LE the last few days.  Not on BP meds because she has had "white coat hypertension".  Chest CT without PE.  Mild troponin leak, BNP 1500.  Likely needs evaluation for CHF.  On room air.  Very SOB with ambulation.    Plan of care: The patient is accepted for observation on Telemetry unit, at [facility].   Author: Jonah Blue, MD 02/07/2023  Check [email or web address] for on-call coverage.  Nursing staff, Please call [facility] Admits & Consults System-Wide number on Amion as soon as patient's arrival, so appropriate admitting provider can evaluate the pt.

## 2023-02-08 DIAGNOSIS — Z8249 Family history of ischemic heart disease and other diseases of the circulatory system: Secondary | ICD-10-CM | POA: Diagnosis not present

## 2023-02-08 DIAGNOSIS — Z888 Allergy status to other drugs, medicaments and biological substances status: Secondary | ICD-10-CM | POA: Diagnosis not present

## 2023-02-08 DIAGNOSIS — G4700 Insomnia, unspecified: Secondary | ICD-10-CM | POA: Diagnosis present

## 2023-02-08 DIAGNOSIS — I959 Hypotension, unspecified: Secondary | ICD-10-CM | POA: Diagnosis not present

## 2023-02-08 DIAGNOSIS — I5021 Acute systolic (congestive) heart failure: Secondary | ICD-10-CM | POA: Diagnosis not present

## 2023-02-08 DIAGNOSIS — Z6841 Body Mass Index (BMI) 40.0 and over, adult: Secondary | ICD-10-CM | POA: Diagnosis not present

## 2023-02-08 DIAGNOSIS — E8770 Fluid overload, unspecified: Secondary | ICD-10-CM | POA: Diagnosis not present

## 2023-02-08 DIAGNOSIS — J9811 Atelectasis: Secondary | ICD-10-CM | POA: Diagnosis not present

## 2023-02-08 DIAGNOSIS — E876 Hypokalemia: Secondary | ICD-10-CM

## 2023-02-08 DIAGNOSIS — Z560 Unemployment, unspecified: Secondary | ICD-10-CM | POA: Diagnosis not present

## 2023-02-08 DIAGNOSIS — I110 Hypertensive heart disease with heart failure: Secondary | ICD-10-CM | POA: Diagnosis not present

## 2023-02-08 DIAGNOSIS — I509 Heart failure, unspecified: Secondary | ICD-10-CM | POA: Diagnosis not present

## 2023-02-08 DIAGNOSIS — I5043 Acute on chronic combined systolic (congestive) and diastolic (congestive) heart failure: Secondary | ICD-10-CM | POA: Diagnosis not present

## 2023-02-08 DIAGNOSIS — J90 Pleural effusion, not elsewhere classified: Secondary | ICD-10-CM | POA: Diagnosis not present

## 2023-02-08 DIAGNOSIS — F419 Anxiety disorder, unspecified: Secondary | ICD-10-CM | POA: Diagnosis not present

## 2023-02-08 DIAGNOSIS — I472 Ventricular tachycardia, unspecified: Secondary | ICD-10-CM | POA: Diagnosis not present

## 2023-02-08 DIAGNOSIS — I2489 Other forms of acute ischemic heart disease: Secondary | ICD-10-CM | POA: Diagnosis not present

## 2023-02-08 DIAGNOSIS — R0602 Shortness of breath: Secondary | ICD-10-CM | POA: Diagnosis not present

## 2023-02-08 DIAGNOSIS — D509 Iron deficiency anemia, unspecified: Secondary | ICD-10-CM | POA: Diagnosis not present

## 2023-02-08 DIAGNOSIS — I10 Essential (primary) hypertension: Secondary | ICD-10-CM | POA: Diagnosis not present

## 2023-02-08 DIAGNOSIS — Z79899 Other long term (current) drug therapy: Secondary | ICD-10-CM | POA: Diagnosis not present

## 2023-02-08 DIAGNOSIS — F32A Depression, unspecified: Secondary | ICD-10-CM | POA: Diagnosis not present

## 2023-02-08 DIAGNOSIS — I5023 Acute on chronic systolic (congestive) heart failure: Secondary | ICD-10-CM

## 2023-02-08 DIAGNOSIS — R079 Chest pain, unspecified: Secondary | ICD-10-CM | POA: Diagnosis not present

## 2023-02-08 DIAGNOSIS — K429 Umbilical hernia without obstruction or gangrene: Secondary | ICD-10-CM | POA: Diagnosis present

## 2023-02-08 DIAGNOSIS — Z90710 Acquired absence of both cervix and uterus: Secondary | ICD-10-CM | POA: Diagnosis not present

## 2023-02-08 DIAGNOSIS — I428 Other cardiomyopathies: Secondary | ICD-10-CM | POA: Diagnosis not present

## 2023-02-08 DIAGNOSIS — I493 Ventricular premature depolarization: Secondary | ICD-10-CM | POA: Diagnosis present

## 2023-02-08 DIAGNOSIS — E119 Type 2 diabetes mellitus without complications: Secondary | ICD-10-CM | POA: Diagnosis not present

## 2023-02-08 DIAGNOSIS — I272 Pulmonary hypertension, unspecified: Secondary | ICD-10-CM | POA: Diagnosis not present

## 2023-02-08 DIAGNOSIS — D638 Anemia in other chronic diseases classified elsewhere: Secondary | ICD-10-CM | POA: Diagnosis not present

## 2023-02-08 DIAGNOSIS — Z87891 Personal history of nicotine dependence: Secondary | ICD-10-CM | POA: Diagnosis not present

## 2023-02-08 LAB — CBC
HCT: 39.6 % (ref 36.0–46.0)
Hemoglobin: 12.6 g/dL (ref 12.0–15.0)
MCH: 26.9 pg (ref 26.0–34.0)
MCHC: 31.8 g/dL (ref 30.0–36.0)
MCV: 84.4 fL (ref 80.0–100.0)
Platelets: 258 10*3/uL (ref 150–400)
RBC: 4.69 MIL/uL (ref 3.87–5.11)
RDW: 16 % — ABNORMAL HIGH (ref 11.5–15.5)
WBC: 10.5 10*3/uL (ref 4.0–10.5)
nRBC: 0 % (ref 0.0–0.2)

## 2023-02-08 LAB — BASIC METABOLIC PANEL
Anion gap: 11 (ref 5–15)
Anion gap: 12 (ref 5–15)
BUN: 10 mg/dL (ref 6–20)
BUN: 8 mg/dL (ref 6–20)
CO2: 26 mmol/L (ref 22–32)
CO2: 27 mmol/L (ref 22–32)
Calcium: 9.2 mg/dL (ref 8.9–10.3)
Calcium: 9.5 mg/dL (ref 8.9–10.3)
Chloride: 102 mmol/L (ref 98–111)
Chloride: 101 mmol/L (ref 98–111)
Creatinine, Ser: 0.77 mg/dL (ref 0.44–1.00)
Creatinine, Ser: 0.89 mg/dL (ref 0.44–1.00)
GFR, Estimated: >60 mL/min (ref >60)
GFR, Estimated: >60 mL/min (ref >60)
Glucose, Bld: 110 mg/dL — ABNORMAL HIGH (ref 70–99)
Glucose, Bld: 114 mg/dL — ABNORMAL HIGH (ref 70–99)
Potassium: 2.7 mmol/L — CL (ref 3.5–5.1)
Potassium: 3.3 mmol/L — ABNORMAL LOW (ref 3.5–5.1)
Sodium: 139 mmol/L (ref 135–145)
Sodium: 140 mmol/L (ref 135–145)

## 2023-02-08 LAB — HEPARIN LEVEL (UNFRACTIONATED)
Heparin Unfractionated: <0.1 IU/mL — ABNORMAL LOW (ref 0.30–0.70)
Heparin Unfractionated: 0.13 IU/mL — ABNORMAL LOW (ref 0.30–0.70)
Heparin Unfractionated: 0.1 IU/mL — ABNORMAL LOW (ref 0.30–0.70)
Heparin Unfractionated: 0.17 IU/mL — ABNORMAL LOW (ref 0.30–0.70)

## 2023-02-08 LAB — GLUCOSE, CAPILLARY
Glucose-Capillary: 127 mg/dL — ABNORMAL HIGH (ref 70–99)
Glucose-Capillary: 125 mg/dL — ABNORMAL HIGH (ref 70–99)
Glucose-Capillary: 100 mg/dL — ABNORMAL HIGH (ref 70–99)
Glucose-Capillary: 124 mg/dL — ABNORMAL HIGH (ref 70–99)

## 2023-02-08 LAB — HIV ANTIBODY (ROUTINE TESTING W REFLEX): HIV Screen 4th Generation wRfx: NONREACTIVE

## 2023-02-08 LAB — MAGNESIUM: Magnesium: 2.2 mg/dL (ref 1.7–2.4)

## 2023-02-08 MED ORDER — ADULT MULTIVITAMIN W/MINERALS CH
1.0000 | ORAL_TABLET | Freq: Every day | ORAL | Status: DC
  Administered 2023-02-08 – 2023-02-12 (×5): 1 via ORAL
  Filled 2023-02-08 (×5): qty 1

## 2023-02-08 MED ORDER — PNEUMOCOCCAL 20-VAL CONJ VACC 0.5 ML IM SUSY
0.5000 mL | PREFILLED_SYRINGE | INTRAMUSCULAR | Status: DC
  Filled 2023-02-08: qty 0.5

## 2023-02-08 MED ORDER — POTASSIUM CHLORIDE CRYS ER 20 MEQ PO TBCR
40.0000 meq | EXTENDED_RELEASE_TABLET | Freq: Once | ORAL | Status: AC
  Administered 2023-02-08: 40 meq via ORAL
  Filled 2023-02-08: qty 2

## 2023-02-08 MED ORDER — HEPARIN BOLUS VIA INFUSION
2000.0000 [IU] | Freq: Once | INTRAVENOUS | Status: AC
  Administered 2023-02-08: 2000 [IU] via INTRAVENOUS
  Filled 2023-02-08: qty 2000

## 2023-02-08 MED ORDER — MAGNESIUM SULFATE IN D5W 1-5 GM/100ML-% IV SOLN
1.0000 g | Freq: Once | INTRAVENOUS | Status: AC
  Administered 2023-02-08: 1 g via INTRAVENOUS
  Filled 2023-02-08: qty 100

## 2023-02-08 MED ORDER — POTASSIUM CHLORIDE 10 MEQ/100ML IV SOLN
10.0000 meq | INTRAVENOUS | Status: AC
  Administered 2023-02-08: 10 meq via INTRAVENOUS
  Filled 2023-02-08: qty 100

## 2023-02-08 MED ORDER — DAPAGLIFLOZIN PROPANEDIOL 10 MG PO TABS
10.0000 mg | ORAL_TABLET | Freq: Every day | ORAL | Status: DC
  Administered 2023-02-08 – 2023-02-12 (×5): 10 mg via ORAL
  Filled 2023-02-08 (×5): qty 1

## 2023-02-08 MED ORDER — HEPARIN BOLUS VIA INFUSION
2500.0000 [IU] | Freq: Once | INTRAVENOUS | Status: AC
  Administered 2023-02-08: 2500 [IU] via INTRAVENOUS
  Filled 2023-02-08: qty 2500

## 2023-02-08 NOTE — Evaluation (Signed)
Occupational Therapy Evaluation Patient Details Name: Stephanie Reilly MRN: [id number] DOB: 06/30/1971 Today's Date: 02/08/2023   History of Present Illness Pt is a 52 y/o F presenting to ED on 5/3 with L sided chest pain, CT negative for PE. Admitted for new onset CHF. PMH includes anxiety and depression.   Clinical Impression   Pt reports independence at baseline with ADLs and functional mobility, lives with parent whom she cares for. Pt currently at baseline, completing all ADLs, transfers, and bed mobility with supervision-ind, HR up to 130bpm with activity. Pt educated on energy conservation strategies for home including use of shower seat, pt verbalized understanding. Pt presenting with impairments listed below, however has no acute OT needs at this time, will s/o. Please reconsult if there is a change in pt status. Anticipate no OT follow up needs at d/c.       Recommendations for follow up therapy are one component of a multi-disciplinary discharge planning process, led by the attending physician.  Recommendations may be updated based on patient status, additional functional criteria and insurance authorization.   Assistance Recommended at Discharge PRN  Patient can return home with the following      Functional Status Assessment  Patient has had a recent decline in their functional status and demonstrates the ability to make significant improvements in function in a reasonable and predictable amount of time.  Equipment Recommendations  None recommended by OT (pt has all needed DME)    Recommendations for Other Services PT consult     Precautions / Restrictions Precautions Precautions: None Precaution Comments: watch HR Restrictions Weight Bearing Restrictions: No      Mobility Bed Mobility Overal bed mobility: Independent                  Transfers Overall transfer level: Independent Equipment used: None                      Balance Overall balance  assessment: No apparent balance deficits (not formally assessed)                                         ADL either performed or assessed with clinical judgement   ADL Overall ADL's : Needs assistance/impaired Eating/Feeding: Set up;Sitting   Grooming: Min guard;Sitting;Brushing hair Grooming Details (indicate cue type and reason): combing hair at bed level Upper Body Bathing: Sitting;Supervision/ safety   Lower Body Bathing: Sitting/lateral leans;Supervison/ safety   Upper Body Dressing : Sitting;Supervision/safety   Lower Body Dressing: Supervision/safety;Sitting/lateral leans   Toilet Transfer: Regular Toilet;Ambulation;Independent   Toileting- Clothing Manipulation and Hygiene: Sitting/lateral lean;Independent       Functional mobility during ADLs: Supervision/safety       Vision   Vision Assessment?: No apparent visual deficits     Perception Perception Perception Tested?: No   Praxis Praxis Praxis tested?: Not tested    Pertinent Vitals/Pain       Hand Dominance Right   Extremity/Trunk Assessment Upper Extremity Assessment Upper Extremity Assessment: Overall WFL for tasks assessed   Lower Extremity Assessment Lower Extremity Assessment: Overall WFL for tasks assessed   Cervical / Trunk Assessment Cervical / Trunk Assessment: Normal   Communication Communication Communication: No difficulties   Cognition Arousal/Alertness: Awake/alert Behavior During Therapy: WFL for tasks assessed/performed Overall Cognitive Status: Within Functional Limits for tasks assessed  General Comments  HR up to 130s with mobility    Exercises     Shoulder Instructions      Home Living Family/patient expects to be discharged to:: Private residence Living Arrangements: Parent (cares for her mother) Available Help at Discharge: Family;Available PRN/intermittently Type of Home: Apartment Home  Access: Level entry     Home Layout: One level     Bathroom Shower/Tub: Chief Strategy Officer: Standard     Home Equipment: Toilet riser;Grab bars - tub/shower;Tub bench;Cane - single Information systems manager (2 wheels)   Additional Comments: Majority of DME is her mothers      Prior Functioning/Environment Prior Level of Function : Independent/Modified Independent;Driving;Working/employed             Mobility Comments: Ind no AD ADLs Comments: Ind        OT Problem List: Cardiopulmonary status limiting activity      OT Treatment/Interventions:      OT Goals(Current goals can be found in the care plan section) Acute Rehab OT Goals Patient Stated Goal: none stated OT Goal Formulation: With patient Time For Goal Achievement: 02/22/23 Potential to Achieve Goals: Good  OT Frequency:      Co-evaluation              AM-PAC OT "6 Clicks" Daily Activity     Outcome Measure Help from another person eating meals?: None Help from another person taking care of personal grooming?: None Help from another person toileting, which includes using toliet, bedpan, or urinal?: None Help from another person bathing (including washing, rinsing, drying)?: None Help from another person to put on and taking off regular upper body clothing?: None Help from another person to put on and taking off regular lower body clothing?: None 6 Click Score: 24   End of Session Nurse Communication: Mobility status  Activity Tolerance: Patient tolerated treatment well Patient left: in bed;with call bell/phone within reach  OT Visit Diagnosis: Unsteadiness on feet (R26.81);Other abnormalities of gait and mobility (R26.89);Muscle weakness (generalized) (M62.81)                Time: 2130-8657 OT Time Calculation (min): 17 min Charges:  OT General Charges $OT Visit: 1 Visit OT Evaluation $OT Eval Low Complexity: 1 Low  Stephanie Reilly, OTD, OTR/L SecureChat Preferred Acute  Rehab [phone]   Stephanie Reilly 02/08/2023, 4:11 PM

## 2023-02-08 NOTE — Progress Notes (Signed)
Rounding Note    Patient Name: Stephanie Reilly Date of Encounter: 02/08/2023  [facility] Cardiologist: None  Subjective   Laying in bed, comfortable, enjoys the convenience of pure wick  Inpatient Medications    Scheduled Meds:  docusate sodium  100 mg Oral BID   furosemide  40 mg Intravenous BID   insulin aspart  0-15 Units Subcutaneous TID WC   insulin aspart  0-5 Units Subcutaneous QHS   loratadine  10 mg Oral Daily   sacubitril-valsartan  1 tablet Oral BID   sodium chloride flush  3 mL Intravenous Q12H   spironolactone  25 mg Oral Daily   Continuous Infusions:  heparin 1,100 Units/hr (02/08/23 0245)   PRN Meds: bisacodyl, hydrALAZINE, ondansetron **OR** ondansetron (ZOFRAN) IV, polyethylene glycol, traZODone   Vital Signs    Vitals:   02/07/23 2329 02/08/23 0601 02/08/23 0900 02/08/23 1119  BP:  120/85 118/80 112/85  Pulse:  97 92 (!) 106  Resp:  20 20 20   Temp:  98.2 F (36.8 C) 98.3 F (36.8 C) 98.2 F (36.8 C)  TempSrc:  Oral Oral Oral  SpO2: 93% 91% 93% 94%  Weight:  113.8 kg    Height:        Intake/Output Summary (Last 24 hours) at 02/08/2023 1133 Last data filed at 02/08/2023 1123 Gross per 24 hour  Intake 638.17 ml  Output 6350 ml  Net -5711.83 ml      02/08/2023    6:01 AM 02/07/2023    3:54 PM 02/07/2023   10:00 AM  Last 3 Weights  Weight (lbs) 250 lb 12.8 oz 257 lb 11.5 oz 230 lb  Weight (kg) 113.762 kg 116.9 kg 104.327 kg      Telemetry    Sinus rhythm/sinus tachycardia- Personally Reviewed  ECG    Sinus rhythm with nonspecific ST-T wave changes- Personally Reviewed  Physical Exam   GEN: No acute distress.   Neck: No JVD Cardiac: RRR, no murmurs, rubs, or gallops.  Respiratory: Clear to auscultation bilaterally. GI: Soft, nontender, non-distended  MS: No edema; No deformity. Neuro:  Nonfocal  Psych: Normal affect   Labs    High Sensitivity Troponin:   Recent Labs  Lab 02/07/23 0947 02/07/23 1157 02/07/23 1441  02/07/23 1738 02/07/23 1825  TROPONINIHS 29* 107* 292* 457* 727*     Chemistry Recent Labs  Lab 02/07/23 0947 02/08/23 0030 02/08/23 1032  NA 140 139 140  K 3.1* 2.7* 3.3*  CL 108 102 101  CO2 23 26 27   GLUCOSE 139* 110* 114*  BUN 15 10 8   CREATININE 0.89 0.77 0.89  CALCIUM 9.0 9.2 9.5  MG  --   --  2.2  PROT 7.3  --   --   ALBUMIN 3.5  --   --   AST 33  --   --   ALT 39  --   --   ALKPHOS 70  --   --   BILITOT 0.9  --   --   GFRNONAA >60 >60 >60  ANIONGAP 9 11 12     Lipids No results for input(s): "CHOL", "TRIG", "HDL", "LABVLDL", "LDLCALC", "CHOLHDL" in the last 168 hours.  Hematology Recent Labs  Lab 02/07/23 0947 02/08/23 0030  WBC 10.6* 10.5  RBC 4.50 4.69  HGB 12.1 12.6  HCT 37.8 39.6  MCV 84.0 84.4  MCH 26.9 26.9  MCHC 32.0 31.8  RDW 15.7* 16.0*  PLT 293 258   Thyroid  Recent Labs  Lab 02/07/23  1738  TSH 1.459    BNP Recent Labs  Lab 02/07/23 0947  BNP 1,470.5*    DDimer No results for input(s): "DDIMER" in the last 168 hours.   Radiology    ECHOCARDIOGRAM COMPLETE  Result Date: 02/07/2023    ECHOCARDIOGRAM REPORT   Patient Name:   Stephanie Reilly Date of Exam: 02/07/2023 Medical Rec #:  [id number]     Height:       62.5 in Accession #:    [id number]    Weight:       257.7 lb Date of Birth:  November 10, 1970     BSA:          2.142 m Patient Age:    51 years      BP:           141/113 mmHg Patient Gender: F             HR:           114 bpm. Exam Location:  Inpatient Procedure: 2D Echo, 3D Echo, Cardiac Doppler, Color Doppler and Strain Analysis Indications:    Congestive Heart Failure I50.9  History:        Patient has no prior history of Echocardiogram examinations.                 CHF; Risk Factors:Former Smoker, Hypertension and Diabetes.  Sonographer:    Dondra Prader RVT RCS Referring Phys: [id number] JENNIFER YATES IMPRESSIONS  1. Left ventricular ejection fraction, by estimation, is <20%. The left ventricle has severely decreased function. The left ventricle  demonstrates global hypokinesis. The left ventricular internal cavity size was severely dilated. Left ventricular diastolic parameters are consistent with Grade III diastolic dysfunction (restrictive). The average left ventricular global longitudinal strain is -4.8 %. The global longitudinal strain is abnormal.  2. Right ventricular systolic function is mildly reduced. The right ventricular size is normal.  3. Left atrial size was severely dilated.  4. The mitral valve is normal in structure. Mild mitral valve regurgitation. No evidence of mitral stenosis.  5. The aortic valve is tricuspid. Aortic valve regurgitation is not visualized. No aortic stenosis is present.  6. The inferior vena cava is dilated in size with <50% respiratory variability, suggesting right atrial pressure of 15 mmHg. Comparison(s): No prior Echocardiogram. FINDINGS  Left Ventricle: Left ventricular ejection fraction, by estimation, is <20%. The left ventricle has severely decreased function. The left ventricle demonstrates global hypokinesis. The average left ventricular global longitudinal strain is -4.8 %. The global longitudinal strain is abnormal. The left ventricular internal cavity size was severely dilated. There is no left ventricular hypertrophy. Left ventricular diastolic parameters are consistent with Grade III diastolic dysfunction (restrictive). Right Ventricle: The right ventricular size is normal. Right ventricular systolic function is mildly reduced. Left Atrium: Left atrial size was severely dilated. Right Atrium: Right atrial size was normal in size. Pericardium: Trivial pericardial effusion is present. Mitral Valve: The mitral valve is normal in structure. Mild mitral valve regurgitation. No evidence of mitral valve stenosis. Tricuspid Valve: The tricuspid valve is normal in structure. Tricuspid valve regurgitation is mild . No evidence of tricuspid stenosis. Aortic Valve: The aortic valve is tricuspid. Aortic valve  regurgitation is not visualized. No aortic stenosis is present. Aortic valve mean gradient measures 2.0 mmHg. Aortic valve peak gradient measures 4.3 mmHg. Aortic valve area, by VTI measures 3.66 cm. Pulmonic Valve: The pulmonic valve was normal in structure. Pulmonic valve regurgitation is trivial. No evidence of pulmonic stenosis. Aorta:  The aortic root is normal in size and structure. Venous: The inferior vena cava is dilated in size with less than 50% respiratory variability, suggesting right atrial pressure of 15 mmHg. IAS/Shunts: There is right bowing of the interatrial septum, suggestive of elevated left atrial pressure. No atrial level shunt detected by color flow Doppler.  LEFT VENTRICLE PLAX 2D LVIDd:         6.40 cm LVIDs:         5.50 cm   2D Longitudinal Strain LV PW:         1.20 cm   2D Strain GLS Avg:     -4.8 % LV IVS:        1.00 cm LVOT diam:     2.00 cm LV SV:         51 LV SV Index:   24        3D Volume EF: LVOT Area:     3.14 cm  3D EF:        29 %                          LV EDV:       266 ml                          LV ESV:       188 ml                          LV SV:        78 ml RIGHT VENTRICLE             IVC RV Basal diam:  3.30 cm     IVC diam: 2.50 cm RV Mid diam:    2.50 cm RV S prime:     11.60 cm/s TAPSE (M-mode): 2.1 cm LEFT ATRIUM              Index        RIGHT ATRIUM           Index LA diam:        5.40 cm  2.52 cm/m   RA Area:     21.30 cm LA Vol (A2C):   126.0 ml 58.83 ml/m  RA Volume:   63.90 ml  29.83 ml/m LA Vol (A4C):   74.8 ml  34.92 ml/m LA Biplane Vol: 98.6 ml  46.03 ml/m  AORTIC VALVE                    PULMONIC VALVE AV Area (Vmax):    3.38 cm     PV Vmax:       0.75 m/s AV Area (Vmean):   3.07 cm     PV Peak grad:  2.3 mmHg AV Area (VTI):     3.66 cm AV Vmax:           104.00 cm/s AV Vmean:          71.400 cm/s AV VTI:            0.140 m AV Peak Grad:      4.3 mmHg AV Mean Grad:      2.0 mmHg LVOT Vmax:         112.00 cm/s LVOT Vmean:        69.800 cm/s  LVOT VTI:          0.163  m LVOT/AV VTI ratio: 1.16  AORTA Ao Root diam: 3.10 cm Ao Asc diam:  3.50 cm Ao Arch diam: 2.8 cm MITRAL VALVE                TRICUSPID VALVE MV Area (PHT): 4.89 cm     TR Peak grad:   29.8 mmHg MV Decel Time: 155 msec     TR Vmax:        273.00 cm/s MR Peak grad: 85.7 mmHg MR Mean grad: 58.0 mmHg     SHUNTS MR Vmax:      463.00 cm/s   Systemic VTI:  0.16 m MR Vmean:     355.0 cm/s    Systemic Diam: 2.00 cm MV E velocity: 120.00 cm/s Olga Millers MD Electronically signed by Olga Millers MD Signature Date/Time: 02/07/2023/5:19:03 PM    Final    CT Angio Chest PE W and/or Wo Contrast  Result Date: 02/07/2023 CLINICAL DATA:  Chest pain. EXAM: CT ANGIOGRAPHY CHEST WITH CONTRAST TECHNIQUE: Multidetector CT imaging of the chest was performed using the standard protocol during bolus administration of intravenous contrast. Multiplanar CT image reconstructions and MIPs were obtained to evaluate the vascular anatomy. RADIATION DOSE REDUCTION: This exam was performed according to the departmental dose-optimization program which includes automated exposure control, adjustment of the mA and/or kV according to patient size and/or use of iterative reconstruction technique. CONTRAST:  OMNIPAQUE IOHEXOL 350 MG/ML SOLN COMPARISON:  None Available. FINDINGS: Cardiovascular: Satisfactory opacification of the pulmonary arteries to the segmental level. No evidence of pulmonary embolism. Moderate cardiomegaly is noted. No pericardial effusion. Mediastinum/Nodes: No enlarged mediastinal, hilar, or axillary lymph nodes. Thyroid gland, trachea, and esophagus demonstrate no significant findings. Lungs/Pleura: No pneumothorax is noted. Small right pleural effusion is noted with minimal adjacent subsegmental atelectasis. Minimal left basilar subsegmental atelectasis is noted. Upper Abdomen: No acute abnormality. Musculoskeletal: No chest wall abnormality. No acute or significant osseous findings. Review of  the MIP images confirms the above findings. IMPRESSION: No definite evidence of pulmonary embolus. Moderate cardiomegaly. Small right pleural effusion. Minimal bibasilar subsegmental atelectasis. Electronically Signed   By: Lupita Raider M.D.   On: 02/07/2023 11:30   DG Chest Portable 1 View  Result Date: 02/07/2023 CLINICAL DATA:  Chest pain EXAM: PORTABLE CHEST 1 VIEW COMPARISON:  X-ray 07/29/2022 FINDINGS: Enlarged cardiopericardial silhouette with slight central vascular congestion. No pneumothorax, effusion or consolidation. Film is under penetrated. IMPRESSION: Under penetrated radiograph. Enlarged heart with central vascular congestion Electronically Signed   By: Karen Kays M.D.   On: 02/07/2023 09:47    Cardiac Studies   Echocardiogram 02/07/2023:   1. Left ventricular ejection fraction, by estimation, is <20%. The left  ventricle has severely decreased function. The left ventricle demonstrates  global hypokinesis. The left ventricular internal cavity size was severely  dilated. Left ventricular  diastolic parameters are consistent with Grade III diastolic dysfunction  (restrictive). The average left ventricular global longitudinal strain is  -4.8 %. The global longitudinal strain is abnormal.   2. Right ventricular systolic function is mildly reduced. The right  ventricular size is normal.   3. Left atrial size was severely dilated.   4. The mitral valve is normal in structure. Mild mitral valve  regurgitation. No evidence of mitral stenosis.   5. The aortic valve is tricuspid. Aortic valve regurgitation is not  visualized. No aortic stenosis is present.   6. The inferior vena cava is dilated in size with <50% respiratory  variability, suggesting right  atrial pressure of 15 mmHg.   Patient Profile     52 y.o. female with acute systolic heart failure, EF less than 20% unknown etiology elevated troponin 700  Assessment & Plan    Acute systolic heart failure - EF less than  20.  Cardiac catheterization right and left heart catheterization Monday.  BNP 1400 -IV diuresis for now.  Continue to optimize goal-directed medical therapy which includes Entresto Farxiga Coreg spironolactone -HIV negative TSH normal  Uncontrolled hypertension - Yesterday 141/113.  This could be etiology for her heart failure.  Hypokalemia - Potassium 2.7, will replete.  Creatinine 0.89.    For questions or updates, please contact [facility] Please consult [email or web address] for contact info under        Signed, Donato Schultz, MD  02/08/2023, 11:33 AM

## 2023-02-08 NOTE — Assessment & Plan Note (Signed)
Fasting glucose is 98 mg/dl.  Continue insulin sliding scale for glucose cover and monitoring.

## 2023-02-08 NOTE — Progress Notes (Signed)
ANTICOAGULATION CONSULT NOTE - Follow-Up Consult  Pharmacy Consult for Heparin Indication: chest pain/ACS  Allergies  Allergen Reactions   Paroxetine Hcl Anaphylaxis   Prednisone Shortness Of Breath and Other (See Comments)    Confusion, "messes with mental," muscles feel weird, fatigue, insomnia   Chocolate    Ibuprofen    Iron Other (See Comments)   Latex    Lisinopril Hives   Naproxen    Tramadol Hives   Tylenol [Acetaminophen] Other (See Comments)    With Codeine; GI    Penicillins Rash   Serotonin Nausea Only, Swelling, Anxiety, Rash and Other (See Comments)    GI intolerence    Patient Measurements: Height: 5' 2.5" (158.8 cm) Weight: 113.8 kg (250 lb 12.8 oz) IBW/kg (Calculated) : 51.25 Heparin Dosing Weight: 79.9 kg  Vital Signs: Temp: 98.3 F (36.8 C) (05/04 1611) Temp Source: Oral (05/04 1611) BP: 100/85 (05/04 1611) Pulse Rate: 108 (05/04 1700)  Labs: Recent Labs    02/07/23 0947 02/07/23 1157 02/07/23 1441 02/07/23 1738 02/07/23 1825 02/07/23 2001 02/08/23 0030 02/08/23 0030 02/08/23 0624 02/08/23 1032 02/08/23 1756  HGB 12.1  --   --   --   --   --  12.6  --   --   --   --   HCT 37.8  --   --   --   --   --  39.6  --   --   --   --   PLT 293  --   --   --   --   --  258  --   --   --   --   APTT  --   --   --   --   --  41*  --   --   --   --   --   LABPROT  --   --   --  14.9  --  15.8*  --   --   --   --   --   INR  --   --   --  1.2  --  1.3*  --   --   --   --   --   HEPARINUNFRC  --   --   --   --   --   --  <0.10*   < > 0.13* 0.10* 0.17*  CREATININE 0.89  --   --   --   --   --  0.77  --   --  0.89  --   TROPONINIHS 29*   < > 292* 457* 727*  --   --   --   --   --   --    < > = values in this interval not displayed.     Estimated Creatinine Clearance: 88.1 mL/min (by C-G formula based on SCr of 0.89 mg/dL).   Medical History: Past Medical History:  Diagnosis Date   Anemia    Anxiety    Arthritis    Depression    Hypertension     Prediabetes     Assessment: 52 yo female presents with chest pain and reduced EF. Pharmacy consulted to dose IV heparin with plans for R and L heart cath 5/6.   Heparin level 0.17 on 1450 units/hr. Heparin level continues to be subtherapeutic. Hgb 12.6 and plt 258. No issues with infusion per RN. No s/sx bleeding noted in chart.    Goal of Therapy:  Heparin level 0.3-0.7 units/ml Monitor platelets  by anticoagulation protocol: Yes   Plan:  Increase heparin infusion to 1700 units/hr  Will f/u heparin level in 6 hours Monitor daily heparin level and CBC Continue to monitor H&H   F/u [facility] plans s/p cath Monday    [facility], [place].D., BCPS Clinical Pharmacist  **Pharmacist phone directory can be found on [email or web address] listed under [facility].  02/08/2023 7:34 PM

## 2023-02-08 NOTE — Assessment & Plan Note (Signed)
Echocardiogram with reduced LV systolic function < 20%, global hypokinesis, LV cavity with severe dilatation, no LVH, RV with preserved systolic function, LA with severe dilatation, trivial pericardial effusion, no significant valvular disease.   Urine output 4,800 ml Systolic blood pressure 112 to 118 mmHg.   Plan to continue diuresis with furosemide IV 40 mg.  Continue with Entresto and spironolactone.  Add SGLT 2 inh.  Possible addition of B blocker prior to her discharge.  Check iron panel. Will need cardiac MRI.   Elevated troponin likely due to heart failure, can not rule out NSTEMI, will continue heparin drip for now, and will follow up with coronary angiography.  Patient with no chest pain.

## 2023-02-08 NOTE — Assessment & Plan Note (Signed)
Patient with improvement in volume status,  This am with hypotension.  Plan to continue with entresto (with holding paramenters) Continue with spironolactone  Possible addition of beta blocker prior to her discharge.

## 2023-02-08 NOTE — Progress Notes (Signed)
CRITICAL VALUE STICKER  CRITICAL VALUE: Potassium = 2.7  RECEIVER (on-site recipient of call): Jinny Sanders RN  DATE & TIME NOTIFIED: 02/08/2023 0207  MESSENGER (representative from lab): Axel Filler  MD NOTIFIED: Dr. Antionette Char  TIME OF NOTIFICATION: 0209  RESPONSE:  New orders placed in [facility] to be carried out

## 2023-02-08 NOTE — Progress Notes (Signed)
Progress Note   Patient: Stephanie Reilly ZOX:[id number] DOB: Mar 19, 1971 DOA: 02/07/2023     0 DOS: the patient was seen and examined on 02/08/2023   Brief hospital course: Stephanie Reilly was admitted to the hospital with the working diagnosis of heart failure.   52 yo female with the past medical history of depression and anxiety, who presented with dyspnea. Reports about 4 weeks of worsening dyspnea on exertion, over the last several days she noted lower extremity edema. On the day of  hospitalization she had chest pain at rest that woke her up for sleep, prompting her to come to the Ed. On her initial physical examination her blood pressure was 142/110, HT 102, RR 26 and 02 saturation 93%, lungs with no wheezing or rales, heart with S1 and S2 present and rhythmic, abdomen with no distention and positive lower extremity edema.   Na 140, K 3.1 CL 108, bicarbonate 23, glucose 108, bun 15 cr 0,89  BNP 1,470 High sensitive troponin 29, 107, 292, 457. 727  Wbc 10,6 hgb 12.1 plt 293  Toxicology screen negative   Chest radiograph with cardiomegaly, with bilateral hilar vascular congestion.  CT chest with bilateral ground glass opacities, small right pleural effusion, no pulmonary embolism.   EKG 115 bpm, left axis deviation, normal intervals, sinus rhythm with PVC, no significant ST segment changes, negative T wave in V5 and V6.   Patient was placed on IV heparin for NSTEMI.,  Diuresis with furosemide.   Assessment and Plan: * Acute on chronic systolic CHF (congestive heart failure) (HCC) Echocardiogram with reduced LV systolic function < 20%, global hypokinesis, LV cavity with severe dilatation, no LVH, RV with preserved systolic function, LA with severe dilatation, trivial pericardial effusion, no significant valvular disease.   Urine output 4,800 ml Systolic blood pressure 112 to 118 mmHg.   Plan to continue diuresis with furosemide IV 40 mg.  Continue with Entresto and spironolactone.  Add  SGLT 2 inh.  Possible addition of B blocker prior to her discharge.  Check iron panel. Will need cardiac MRI.   Elevated troponin likely due to heart failure, can not rule out NSTEMI, will continue heparin drip for now, and will follow up with coronary angiography.  Patient with no chest pain.  Essential hypertension Continue with entresto and spironolactone Diuresis with furosemide.  Possible addition of beta blocker prior to her discharge.   Diabetes mellitus type 2, diet-controlled (HCC) Fasting glucose is 114.  Continue insulin sliding scale for glucose cover and monitoring.   Hypokalemia Renal function with serum cr at 0,89, K is 3,3 Na 140 and Mg 2,2   Plan to continue K correction with Kcl, follow up renal function in am. Continue diuresis with furosemide, empagliflozin and spironolactone.   Obesity, Class III, BMI 40-49.9 (morbid obesity) (HCC) Calculated BMI is 45.1         Subjective: Patient with no chest pain, improving dyspnea and lower extremity edema.   Physical Exam: Vitals:   02/07/23 2329 02/08/23 0601 02/08/23 0900 02/08/23 1119  BP:  120/85 118/80 112/85  Pulse:  97 92 (!) 106  Resp:  20 20 20   Temp:  98.2 F (36.8 C) 98.3 F (36.8 C) 98.2 F (36.8 C)  TempSrc:  Oral Oral Oral  SpO2: 93% 91% 93% 94%  Weight:  113.8 kg    Height:       Neurology awake and alert ENT with mild pallor Cardiovascular with S1 and S2 present and rhythmic with no gallops, rubs  or murmurs Positive moderate JVD Trace lower extremity edema Respiratory with no rales or wheezing, no rhonchi Abdomen with no distention  Data Reviewed:    Family Communication: no family at the bedside   Disposition: Status is: Inpatient Remains inpatient appropriate because: heart failure, iv diuresis and IV heparin   Planned Discharge Destination: Home      Author: Coralie Keens, MD 02/08/2023 1:16 PM  For on call review [email or web address].

## 2023-02-08 NOTE — Hospital Course (Addendum)
Mrs. Uden was admitted to the hospital with the working diagnosis of heart failure.   52 yo female with the past medical history of depression and anxiety, who presented with dyspnea. Reports about 4 weeks of worsening dyspnea on exertion, over the last several days she noted lower extremity edema. On the day of  hospitalization she had chest pain at rest that woke her up for sleep, prompting her to come to the Ed. On her initial physical examination her blood pressure was 142/110, HT 102, RR 26 and 02 saturation 93%, lungs with no wheezing or rales, heart with S1 and S2 present and rhythmic, abdomen with no distention and positive lower extremity edema.   Na 140, K 3.1 CL 108, bicarbonate 23, glucose 108, bun 15 cr 0,89  BNP 1,470 High sensitive troponin 29, 107, 292, 457. 727  Wbc 10,6 hgb 12.1 plt 293  Toxicology screen negative   Chest radiograph with cardiomegaly, with bilateral hilar vascular congestion.  CT chest with bilateral ground glass opacities, small right pleural effusion, no pulmonary embolism.   EKG 115 bpm, left axis deviation, normal intervals, sinus rhythm with PVC, no significant ST segment changes, negative T wave in V5 and V6.   Patient was placed on IV heparin for NSTEMI.,  Diuresis with furosemide.   05/05 patient responding well to diuresis. Plan for coronary angiography and right heart cath tomorrow.  05/06 patient had 04 beat of VT. Self resolved.  Plan for catheterization today.

## 2023-02-08 NOTE — Discharge Instructions (Signed)

## 2023-02-08 NOTE — Evaluation (Signed)
Physical Therapy Evaluation Patient Details Name: Stephanie Reilly MRN: [id number] DOB: 1971-05-19 Today's Date: 02/08/2023  History of Present Illness  Pt is a 52 year old female admitted on 02/07/23 with SOB. Past medical history significant of anxiety/depression.  Clinical Impression  Pt presents with admitting diagnosis above. Pt was able to ambulate in hallway independently with no AD. Pt presents at or near baseline mobility. Pt has no further acute PT needs and will be signing off. Re consult PT if mobility status changes.        Recommendations for follow up therapy are one component of a multi-disciplinary discharge planning process, led by the attending physician.  Recommendations may be updated based on patient status, additional functional criteria and insurance authorization.  Follow Up Recommendations       Assistance Recommended at Discharge PRN  Patient can return home with the following  Assist for transportation    Equipment Recommendations None recommended by PT  Recommendations for Other Services       Functional Status Assessment Patient has had a recent decline in their functional status and demonstrates the ability to make significant improvements in function in a reasonable and predictable amount of time.     Precautions / Restrictions Precautions Precautions: None Restrictions Weight Bearing Restrictions: No      Mobility  Bed Mobility Overal bed mobility: Independent                  Transfers Overall transfer level: Independent Equipment used: None                    Ambulation/Gait Ambulation/Gait assistance: Independent Gait Distance (Feet): 600 Feet Assistive device: None Gait Pattern/deviations: WFL(Within Functional Limits) Gait velocity: WNL Gait velocity interpretation: >4.37 ft/sec, indicative of normal walking speed   General Gait Details: no LOB noted  Stairs            Wheelchair Mobility    Modified  Rankin (Stroke Patients Only)       Balance Overall balance assessment: No apparent balance deficits (not formally assessed)                                           Pertinent Vitals/Pain Pain Assessment Pain Assessment: No/denies pain    Home Living Family/patient expects to be discharged to:: Private residence Living Arrangements: Parent (Primary caretaker for her mother) Available Help at Discharge: Family;Available PRN/intermittently Type of Home: Apartment Home Access: Level entry       Home Layout: One level Home Equipment: Toilet riser;Grab bars - tub/shower;Tub bench;Cane - single Information systems manager (2 wheels) Additional Comments: Majority of DME is her mothers    Prior Function Prior Level of Function : Independent/Modified Independent;Driving;Working/employed             Mobility Comments: Ind no AD ADLs Comments: Ind     Hand Dominance   Dominant Hand: Right    Extremity/Trunk Assessment   Upper Extremity Assessment Upper Extremity Assessment: Overall WFL for tasks assessed    Lower Extremity Assessment Lower Extremity Assessment: Overall WFL for tasks assessed    Cervical / Trunk Assessment Cervical / Trunk Assessment: Normal  Communication   Communication: No difficulties  Cognition Arousal/Alertness: Awake/alert Behavior During Therapy: WFL for tasks assessed/performed Overall Cognitive Status: Within Functional Limits for tasks assessed  General Comments General comments (skin integrity, edema, etc.): VSS on RA    Exercises     Assessment/Plan    PT Assessment Patient does not need any further PT services  PT Problem List         PT Treatment Interventions      PT Goals (Current goals can be found in the Care Plan section)       Frequency       Co-evaluation               AM-PAC PT "6 Clicks" Mobility  Outcome Measure Help  needed turning from your back to your side while in a flat bed without using bedrails?: None Help needed moving from lying on your back to sitting on the side of a flat bed without using bedrails?: None Help needed moving to and from a bed to a chair (including a wheelchair)?: None Help needed standing up from a chair using your arms (e.g., wheelchair or bedside chair)?: None Help needed to walk in hospital room?: None Help needed climbing 3-5 steps with a railing? : None 6 Click Score: 24    End of Session   Activity Tolerance: Patient tolerated treatment well Patient left: in bed;with call bell/phone within reach Nurse Communication: Mobility status PT Visit Diagnosis: Other abnormalities of gait and mobility (R26.89)    Time: 4098-1191 PT Time Calculation (min) (ACUTE ONLY): 37 min   Charges:   PT Evaluation $PT Eval Low Complexity: 1 Low PT Treatments $Therapeutic Exercise: 8-22 mins        Shela Nevin, PT, DPT Acute Rehab Services [phone]   Gladys Damme 02/08/2023, 9:33 AM

## 2023-02-08 NOTE — Assessment & Plan Note (Signed)
Calculated BMI is 45.1 

## 2023-02-08 NOTE — Progress Notes (Signed)
ANTICOAGULATION CONSULT NOTE   Pharmacy Consult for Heparin Indication: chest pain/ACS  Allergies  Allergen Reactions   Paroxetine Hcl Anaphylaxis   Prednisone Shortness Of Breath and Other (See Comments)    Confusion, "messes with mental," muscles feel weird, fatigue, insomnia   Chocolate    Ibuprofen    Iron Other (See Comments)   Latex    Lisinopril Hives   Naproxen    Tramadol Hives   Tylenol [Acetaminophen] Other (See Comments)    With Codeine; GI    Penicillins Rash   Serotonin Nausea Only, Swelling, Anxiety, Rash and Other (See Comments)    GI intolerence    Patient Measurements: Height: 5' 2.5" (158.8 cm) Weight: 116.9 kg (257 lb 11.5 oz) IBW/kg (Calculated) : 51.25 Heparin Dosing Weight: 79.9 kg  Vital Signs: Temp: 99.4 F (37.4 C) (05/03 2328) Temp Source: Oral (05/03 2328) BP: 122/90 (05/03 2328) Pulse Rate: 105 (05/03 2050)  Labs: Recent Labs    02/07/23 0947 02/07/23 1157 02/07/23 1441 02/07/23 1738 02/07/23 1825 02/07/23 2001 02/08/23 0030  HGB 12.1  --   --   --   --   --  12.6  HCT 37.8  --   --   --   --   --  39.6  PLT 293  --   --   --   --   --  258  APTT  --   --   --   --   --  41*  --   LABPROT  --   --   --  14.9  --  15.8*  --   INR  --   --   --  1.2  --  1.3*  --   HEPARINUNFRC  --   --   --   --   --   --  <0.10*  CREATININE 0.89  --   --   --   --   --  0.77  TROPONINIHS 29*   < > 292* 457* 727*  --   --    < > = values in this interval not displayed.     Estimated Creatinine Clearance: 99.5 mL/min (by C-G formula based on SCr of 0.77 mg/dL).   Medical History: Past Medical History:  Diagnosis Date   Anemia    Anxiety    Arthritis    Depression    Hypertension    Prediabetes      Assessment: 52 yo female presents with chest pain. Pharmacy consulted to dose IV heparin. Troponins elevated, EKG with non-specific ST changes. No anticoagulants PTA, CBC wnl.  5/4 AM update:  Heparin level low  Goal of Therapy:   Heparin level 0.3-0.7 units/ml Monitor platelets by anticoagulation protocol: Yes   Plan:  Heparin 2000 units re-[employer] heparin to 1100 units/hr 6-8 hour heparin level  Abran Duke, PharmD, BCPS Clinical Pharmacist Phone: [phone]

## 2023-02-08 NOTE — Progress Notes (Signed)
Initial Nutrition Assessment  DOCUMENTATION CODES:   Morbid obesity  INTERVENTION:   -Liberalize diet to 2 gram sodium for wider variety of meal selections -MVI with minerals daily -Magic cup TID with meals, each supplement provides 290 kcal and 9 grams of protein  -RD provided "Low Sodium Nutrition Therapy" handout from [facility] Nutrition Care Manual; attached to AVS/ discharge summary  NUTRITION DIAGNOSIS:   Increased nutrient needs related to chronic illness (CHF) as evidenced by estimated needs.  GOAL:   Patient will meet greater than or equal to 90% of their needs  MONITOR:   PO intake, Supplement acceptance  REASON FOR ASSESSMENT:   Consult Assessment of nutrition requirement/status  ASSESSMENT:   Pt with the past medical history of depression and anxiety, who presented with dyspnea. Pt admitted with new onset CHF.  Pt admitted with new CHF.    Reviewed I/O's: -1.6 L x 24 hours and -5.7 L since admission  UOP: 1.6 L x 24 hours  Pt unavailable at time of visit. Attempted to speak with pt via call to hospital room phone, however, unable to reach. RD unable to obtain further nutrition-related history or complete nutrition-focused physical exam at this time.    Pt with new onset CHF. Per MD notes, pt also with history of pre-DM, not on any medications PTA.   Pt is on a heart healthy, carb modified diet with 1.5 L fluid restriction. Meal completions 45%.   No wt loss noted. However, pt with edema, which is likely masking true weight loss as well as fat and muscle depletions.   Medications reviewed and include farxiga, colace, aldactone, and lasix.   No results found for: "HGBA1C" PTA DM medications are none.   Labs reviewed: K: 3.3, CBGS: 126 (inpatient orders for glycemic control are 0-15 units insulin aspart TID with meals and 0-5 units insulin aspart daily at bedtime, ).    Diet Order:   Diet Order             Diet NPO time specified  Diet effective midnight            Diet heart healthy/carb modified Room service appropriate? Yes; Fluid consistency: Thin; Fluid restriction: 1500 mL Fluid  Diet effective now                   EDUCATION NEEDS:   No education needs have been identified at this time  Skin:  Skin Assessment: Reviewed RN Assessment  Last BM:  02/07/23  Height:   Ht Readings from Last 1 Encounters:  02/07/23 5' 2.5" (1.588 m)    Weight:   Wt Readings from Last 1 Encounters:  02/08/23 113.8 kg    Ideal Body Weight:  51.1 kg  BMI:  Body mass index is 45.14 kg/m.  Estimated Nutritional Needs:   Kcal:  1700-1900  Protein:  100-115 grams  Fluid:  1.5    Levada Schilling, RD, LDN, CDCES Registered Dietitian II Certified Diabetes Care and Education Specialist Please refer to [facility] for RD and/or RD on-call/weekend/after hours pager

## 2023-02-08 NOTE — Assessment & Plan Note (Signed)
Volume status has improved, renal function with serum cr at 0,95 with K at 3,2 and serum bicarbonate at 24,  Na 138 and Mg 2,2   Will hold on IV furosemide for now due to hypotension.  Continue with spironolactone and SGLT 2 inh.  Add Kcl 40 meq x2 doses and follow up renal function and electrolytes in am.

## 2023-02-08 NOTE — Progress Notes (Signed)
ANTICOAGULATION CONSULT NOTE - Initial Consult  Pharmacy Consult for Heparin Indication: chest pain/ACS  Allergies  Allergen Reactions   Paroxetine Hcl Anaphylaxis   Prednisone Shortness Of Breath and Other (See Comments)    Confusion, "messes with mental," muscles feel weird, fatigue, insomnia   Chocolate    Ibuprofen    Iron Other (See Comments)   Latex    Lisinopril Hives   Naproxen    Tramadol Hives   Tylenol [Acetaminophen] Other (See Comments)    With Codeine; GI    Penicillins Rash   Serotonin Nausea Only, Swelling, Anxiety, Rash and Other (See Comments)    GI intolerence    Patient Measurements: Height: 5' 2.5" (158.8 cm) Weight: 113.8 kg (250 lb 12.8 oz) IBW/kg (Calculated) : 51.25 Heparin Dosing Weight: 79.9 kg  Vital Signs: Temp: 98.3 F (36.8 C) (05/04 0900) Temp Source: Oral (05/04 0900) BP: 118/80 (05/04 0900) Pulse Rate: 92 (05/04 0900)  Labs: Recent Labs    02/07/23 0947 02/07/23 1157 02/07/23 1441 02/07/23 1738 02/07/23 1825 02/07/23 2001 02/08/23 0030 02/08/23 0624  HGB 12.1  --   --   --   --   --  12.6  --   HCT 37.8  --   --   --   --   --  39.6  --   PLT 293  --   --   --   --   --  258  --   APTT  --   --   --   --   --  41*  --   --   LABPROT  --   --   --  14.9  --  15.8*  --   --   INR  --   --   --  1.2  --  1.3*  --   --   HEPARINUNFRC  --   --   --   --   --   --  <0.10* 0.13*  CREATININE 0.89  --   --   --   --   --  0.77  --   TROPONINIHS 29*   < > 292* 457* 727*  --   --   --    < > = values in this interval not displayed.     Estimated Creatinine Clearance: 98 mL/min (by C-G formula based on SCr of 0.77 mg/dL).   Medical History: Past Medical History:  Diagnosis Date   Anemia    Anxiety    Arthritis    Depression    Hypertension    Prediabetes     Assessment: 52 yo female presents with chest pain. Pharmacy consulted to dose IV heparin. Troponins elevated, EKG with non-specific ST changes. No anticoagulants  PTA, CBC wnl. Plan for [facility] Monday 5/6. Pharmacy consulted to dose heparin.   Heparin level 0.10 on drip rate 1100 units/hr subtherapeutic. Heparin level continues to be subtherapeutic. Hgb 12.6 and plt 258. No issues with infusion per RN. No s/sx bleeding noted in chart.    Goal of Therapy:  Heparin level 0.3-0.7 units/ml Monitor platelets by anticoagulation protocol: Yes   Plan:  Give heparin 2500 unit IV bolus x1 Increase heparin infusion to 1450 units/hr  Will f/u heparin level in 6 hours Monitor daily heparin level and CBC Continue to monitor H&H   F/u [facility] s/p [facility] Monday   Jerry Caras, PharmD PGY1 Pharmacy Resident   02/08/2023 11:57 AM

## 2023-02-09 DIAGNOSIS — E119 Type 2 diabetes mellitus without complications: Secondary | ICD-10-CM | POA: Diagnosis not present

## 2023-02-09 DIAGNOSIS — I5023 Acute on chronic systolic (congestive) heart failure: Secondary | ICD-10-CM | POA: Diagnosis not present

## 2023-02-09 DIAGNOSIS — I10 Essential (primary) hypertension: Secondary | ICD-10-CM | POA: Diagnosis not present

## 2023-02-09 DIAGNOSIS — E876 Hypokalemia: Secondary | ICD-10-CM | POA: Diagnosis not present

## 2023-02-09 DIAGNOSIS — D509 Iron deficiency anemia, unspecified: Secondary | ICD-10-CM

## 2023-02-09 LAB — BASIC METABOLIC PANEL
Anion gap: 12 (ref 5–15)
BUN: 13 mg/dL (ref 6–20)
CO2: 24 mmol/L (ref 22–32)
Calcium: 9.3 mg/dL (ref 8.9–10.3)
Chloride: 102 mmol/L (ref 98–111)
Creatinine, Ser: 0.95 mg/dL (ref 0.44–1.00)
GFR, Estimated: >60 mL/min (ref >60)
Glucose, Bld: 115 mg/dL — ABNORMAL HIGH (ref 70–99)
Potassium: 3.2 mmol/L — ABNORMAL LOW (ref 3.5–5.1)
Sodium: 138 mmol/L (ref 135–145)

## 2023-02-09 LAB — IRON AND TIBC
Iron: 38 ug/dL (ref 28–170)
Saturation Ratios: 9 % — ABNORMAL LOW (ref 10.4–31.8)
TIBC: 426 ug/dL (ref 250–450)
UIBC: 388 ug/dL

## 2023-02-09 LAB — CBC
HCT: 42.3 % (ref 36.0–46.0)
Hemoglobin: 13.5 g/dL (ref 12.0–15.0)
MCH: 26.8 pg (ref 26.0–34.0)
MCHC: 31.9 g/dL (ref 30.0–36.0)
MCV: 83.9 fL (ref 80.0–100.0)
Platelets: 282 10*3/uL (ref 150–400)
RBC: 5.04 MIL/uL (ref 3.87–5.11)
RDW: 16 % — ABNORMAL HIGH (ref 11.5–15.5)
WBC: 10.3 10*3/uL (ref 4.0–10.5)
nRBC: 0 % (ref 0.0–0.2)

## 2023-02-09 LAB — GLUCOSE, CAPILLARY
Glucose-Capillary: 114 mg/dL — ABNORMAL HIGH (ref 70–99)
Glucose-Capillary: 113 mg/dL — ABNORMAL HIGH (ref 70–99)
Glucose-Capillary: 109 mg/dL — ABNORMAL HIGH (ref 70–99)
Glucose-Capillary: 125 mg/dL — ABNORMAL HIGH (ref 70–99)

## 2023-02-09 LAB — TRANSFERRIN: Transferrin: 290 mg/dL (ref 192–382)

## 2023-02-09 LAB — FERRITIN: Ferritin: 93 ng/mL (ref 11–307)

## 2023-02-09 LAB — MAGNESIUM: Magnesium: 2.2 mg/dL (ref 1.7–2.4)

## 2023-02-09 LAB — HEPARIN LEVEL (UNFRACTIONATED): Heparin Unfractionated: 0.39 IU/mL (ref 0.30–0.70)

## 2023-02-09 MED ORDER — SODIUM CHLORIDE 0.9 % IV SOLN: 250.0000 mL | INTRAVENOUS | Status: DC | PRN

## 2023-02-09 MED ORDER — POTASSIUM CHLORIDE CRYS ER 20 MEQ PO TBCR
40.0000 meq | EXTENDED_RELEASE_TABLET | ORAL | Status: AC
  Administered 2023-02-09 (×2): 40 meq via ORAL
  Filled 2023-02-09 (×2): qty 2

## 2023-02-09 MED ORDER — SODIUM CHLORIDE 0.9% FLUSH: 3.0000 mL | INTRAVENOUS | Status: DC | PRN

## 2023-02-09 MED ORDER — SODIUM CHLORIDE 0.9 % IV SOLN: INTRAVENOUS | Status: DC

## 2023-02-09 MED ORDER — METOPROLOL SUCCINATE ER 25 MG PO TB24
25.0000 mg | ORAL_TABLET | Freq: Every day | ORAL | Status: DC
  Administered 2023-02-10 – 2023-02-12 (×3): 25 mg via ORAL
  Filled 2023-02-09 (×3): qty 1

## 2023-02-09 MED ORDER — ASPIRIN 81 MG PO CHEW
81.0000 mg | CHEWABLE_TABLET | Freq: Every day | ORAL | Status: DC
  Administered 2023-02-09 – 2023-02-12 (×4): 81 mg via ORAL
  Filled 2023-02-09 (×4): qty 1

## 2023-02-09 MED ORDER — METOPROLOL SUCCINATE ER 25 MG PO TB24: 25.0000 mg | ORAL_TABLET | Freq: Every day | ORAL | Status: DC

## 2023-02-09 MED ORDER — SODIUM CHLORIDE 0.9% FLUSH
3.0000 mL | Freq: Two times a day (BID) | INTRAVENOUS | Status: DC
  Administered 2023-02-09 – 2023-02-11 (×4): 3 mL via INTRAVENOUS

## 2023-02-09 NOTE — Plan of Care (Signed)

## 2023-02-09 NOTE — Progress Notes (Signed)
Rounding Note    Patient Name: Stephanie Reilly Date of Encounter: 02/09/2023  [facility] Cardiologist: None  Subjective   Comfortable, sitting up in bed, less short of breath.  She is amazed at how well she feels currently.  At home when she went a few feet she had to stop because she was short of breath.  She is now much better.  She no longer has "the wand "but is using the bedside commode frequently.  Inpatient Medications    Scheduled Meds:  dapagliflozin propanediol  10 mg Oral Daily   docusate sodium  100 mg Oral BID   furosemide  40 mg Intravenous BID   insulin aspart  0-15 Units Subcutaneous TID WC   insulin aspart  0-5 Units Subcutaneous QHS   loratadine  10 mg Oral Daily   multivitamin with minerals  1 tablet Oral Daily   pneumococcal 20-valent conjugate vaccine  0.5 mL Intramuscular Tomorrow-1000   potassium chloride  40 mEq Oral Q4H   sacubitril-valsartan  1 tablet Oral BID   sodium chloride flush  3 mL Intravenous Q12H   spironolactone  25 mg Oral Daily   Continuous Infusions:  heparin 1,700 Units/hr (02/09/23 0244)   PRN Meds: bisacodyl, ondansetron **OR** ondansetron (ZOFRAN) IV, polyethylene glycol, traZODone   Vital Signs    Vitals:   02/08/23 2131 02/09/23 0051 02/09/23 0525 02/09/23 0717  BP: 102/73 99/63 103/75 115/86  Pulse: (!) 103 99 92 98  Resp: 20 20 20 18   Temp: 98.7 F (37.1 C) 98.7 F (37.1 C) 98.2 F (36.8 C) 98.2 F (36.8 C)  TempSrc: Oral Oral Oral Oral  SpO2: 93% 93% 93% 93%  Weight:   112.5 kg   Height:        Intake/Output Summary (Last 24 hours) at 02/09/2023 0944 Last data filed at 02/09/2023 0533 Gross per 24 hour  Intake 146.51 ml  Output 3600 ml  Net -3453.49 ml      02/09/2023    5:25 AM 02/08/2023    6:01 AM 02/07/2023    3:54 PM  Last 3 Weights  Weight (lbs) 248 lb 250 lb 12.8 oz 257 lb 11.5 oz  Weight (kg) 112.492 kg 113.762 kg 116.9 kg      Telemetry    Sinus rhythm/sinus tachycardia, no changes-  Personally Reviewed  ECG    Sinus rhythm with nonspecific ST-T wave changes- Personally Reviewed  Physical Exam   GEN: Well nourished, well developed, in no acute distress HEENT: normal Neck: no JVD, carotid bruits, or masses Cardiac: Tachycardic regular; no murmurs, rubs, or gallops,no edema  Respiratory:  clear to auscultation bilaterally, normal work of breathing GI: soft, nontender, nondistended, + BS MS: no deformity or atrophy Skin: warm and dry, no rash Neuro:  Alert and Oriented x 3, Strength and sensation are intact Psych: euthymic mood, full affect   Labs    High Sensitivity Troponin:   Recent Labs  Lab 02/07/23 0947 02/07/23 1157 02/07/23 1441 02/07/23 1738 02/07/23 1825  TROPONINIHS 29* 107* 292* 457* 727*     Chemistry Recent Labs  Lab 02/07/23 0947 02/08/23 0030 02/08/23 1032 02/09/23 0323  NA 140 139 140 138  K 3.1* 2.7* 3.3* 3.2*  CL 108 102 101 102  CO2 23 26 27 24   GLUCOSE 139* 110* 114* 115*  BUN 15 10 8 13   CREATININE 0.89 0.77 0.89 0.95  CALCIUM 9.0 9.2 9.5 9.3  MG  --   --  2.2 2.2  PROT  7.3  --   --   --   ALBUMIN 3.5  --   --   --   AST 33  --   --   --   ALT 39  --   --   --   ALKPHOS 70  --   --   --   BILITOT 0.9  --   --   --   GFRNONAA >60 >60 >60 >60  ANIONGAP 9 11 12 12     Lipids No results for input(s): "CHOL", "TRIG", "HDL", "LABVLDL", "LDLCALC", "CHOLHDL" in the last 168 hours.  Hematology Recent Labs  Lab 02/07/23 0947 02/08/23 0030 02/09/23 0323  WBC 10.6* 10.5 10.3  RBC 4.50 4.69 5.04  HGB 12.1 12.6 13.5  HCT 37.8 39.6 42.3  MCV 84.0 84.4 83.9  MCH 26.9 26.9 26.8  MCHC 32.0 31.8 31.9  RDW 15.7* 16.0* 16.0*  PLT 293 258 282   Thyroid  Recent Labs  Lab 02/07/23 1738  TSH 1.459    BNP Recent Labs  Lab 02/07/23 0947  BNP 1,470.5*    DDimer No results for input(s): "DDIMER" in the last 168 hours.   Radiology    ECHOCARDIOGRAM COMPLETE  Result Date: 02/07/2023    ECHOCARDIOGRAM REPORT   Patient  Name:   Stephanie Reilly Date of Exam: 02/07/2023 Medical Rec #:  [id number]     Height:       62.5 in Accession #:    [id number]    Weight:       257.7 lb Date of Birth:  [birth year]     BSA:          2.142 m Patient Age:    52 years      BP:           141/113 mmHg Patient Gender: F             HR:           114 bpm. Exam Location:  Inpatient Procedure: 2D Echo, 3D Echo, Cardiac Doppler, Color Doppler and Strain Analysis Indications:    Congestive Heart Failure I50.9  History:        Patient has no prior history of Echocardiogram examinations.                 CHF; Risk Factors:Former Smoker, Hypertension and Diabetes.  Sonographer:    Dondra Prader RVT RCS Referring Phys: [id number] JENNIFER YATES IMPRESSIONS  1. Left ventricular ejection fraction, by estimation, is <20%. The left ventricle has severely decreased function. The left ventricle demonstrates global hypokinesis. The left ventricular internal cavity size was severely dilated. Left ventricular diastolic parameters are consistent with Grade III diastolic dysfunction (restrictive). The average left ventricular global longitudinal strain is -4.8 %. The global longitudinal strain is abnormal.  2. Right ventricular systolic function is mildly reduced. The right ventricular size is normal.  3. Left atrial size was severely dilated.  4. The mitral valve is normal in structure. Mild mitral valve regurgitation. No evidence of mitral stenosis.  5. The aortic valve is tricuspid. Aortic valve regurgitation is not visualized. No aortic stenosis is present.  6. The inferior vena cava is dilated in size with <50% respiratory variability, suggesting right atrial pressure of 15 mmHg. Comparison(s): No prior Echocardiogram. FINDINGS  Left Ventricle: Left ventricular ejection fraction, by estimation, is <20%. The left ventricle has severely decreased function. The left ventricle demonstrates global hypokinesis. The average left ventricular global longitudinal strain is -4.8 %. The global  longitudinal  strain is abnormal. The left ventricular internal cavity size was severely dilated. There is no left ventricular hypertrophy. Left ventricular diastolic parameters are consistent with Grade III diastolic dysfunction (restrictive). Right Ventricle: The right ventricular size is normal. Right ventricular systolic function is mildly reduced. Left Atrium: Left atrial size was severely dilated. Right Atrium: Right atrial size was normal in size. Pericardium: Trivial pericardial effusion is present. Mitral Valve: The mitral valve is normal in structure. Mild mitral valve regurgitation. No evidence of mitral valve stenosis. Tricuspid Valve: The tricuspid valve is normal in structure. Tricuspid valve regurgitation is mild . No evidence of tricuspid stenosis. Aortic Valve: The aortic valve is tricuspid. Aortic valve regurgitation is not visualized. No aortic stenosis is present. Aortic valve mean gradient measures 2.0 mmHg. Aortic valve peak gradient measures 4.3 mmHg. Aortic valve area, by VTI measures 3.66 cm. Pulmonic Valve: The pulmonic valve was normal in structure. Pulmonic valve regurgitation is trivial. No evidence of pulmonic stenosis. Aorta: The aortic root is normal in size and structure. Venous: The inferior vena cava is dilated in size with less than 50% respiratory variability, suggesting right atrial pressure of 15 mmHg. IAS/Shunts: There is right bowing of the interatrial septum, suggestive of elevated left atrial pressure. No atrial level shunt detected by color flow Doppler.  LEFT VENTRICLE PLAX 2D LVIDd:         6.40 cm LVIDs:         5.50 cm   2D Longitudinal Strain LV PW:         1.20 cm   2D Strain GLS Avg:     -4.8 % LV IVS:        1.00 cm LVOT diam:     2.00 cm LV SV:         51 LV SV Index:   24        3D Volume EF: LVOT Area:     3.14 cm  3D EF:        29 %                          LV EDV:       266 ml                          LV ESV:       188 ml                          LV SV:         78 ml RIGHT VENTRICLE             IVC RV Basal diam:  3.30 cm     IVC diam: 2.50 cm RV Mid diam:    2.50 cm RV S prime:     11.60 cm/s TAPSE (M-mode): 2.1 cm LEFT ATRIUM              Index        RIGHT ATRIUM           Index LA diam:        5.40 cm  2.52 cm/m   RA Area:     21.30 cm LA Vol (A2C):   126.0 ml 58.83 ml/m  RA Volume:   63.90 ml  29.83 ml/m LA Vol (A4C):   74.8 ml  34.92 ml/m LA Biplane Vol: 98.6 ml  46.03 ml/m  AORTIC  VALVE                    PULMONIC VALVE AV Area (Vmax):    3.38 cm     PV Vmax:       0.75 m/s AV Area (Vmean):   3.07 cm     PV Peak grad:  2.3 mmHg AV Area (VTI):     3.66 cm AV Vmax:           104.00 cm/s AV Vmean:          71.400 cm/s AV VTI:            0.140 m AV Peak Grad:      4.3 mmHg AV Mean Grad:      2.0 mmHg LVOT Vmax:         112.00 cm/s LVOT Vmean:        69.800 cm/s LVOT VTI:          0.163 m LVOT/AV VTI ratio: 1.16  AORTA Ao Root diam: 3.10 cm Ao Asc diam:  3.50 cm Ao Arch diam: 2.8 cm MITRAL VALVE                TRICUSPID VALVE MV Area (PHT): 4.89 cm     TR Peak grad:   29.8 mmHg MV Decel Time: 155 msec     TR Vmax:        273.00 cm/s MR Peak grad: 85.7 mmHg MR Mean grad: 58.0 mmHg     SHUNTS MR Vmax:      463.00 cm/s   Systemic VTI:  0.16 m MR Vmean:     355.0 cm/s    Systemic Diam: 2.00 cm MV E velocity: 120.00 cm/s Olga Millers MD Electronically signed by Olga Millers MD Signature Date/Time: 02/07/2023/5:19:03 PM    Final    CT Angio Chest PE W and/or Wo Contrast  Result Date: 02/07/2023 CLINICAL DATA:  Chest pain. EXAM: CT ANGIOGRAPHY CHEST WITH CONTRAST TECHNIQUE: Multidetector CT imaging of the chest was performed using the standard protocol during bolus administration of intravenous contrast. Multiplanar CT image reconstructions and MIPs were obtained to evaluate the vascular anatomy. RADIATION DOSE REDUCTION: This exam was performed according to the departmental dose-optimization program which includes automated exposure control, adjustment of the  mA and/or kV according to patient size and/or use of iterative reconstruction technique. CONTRAST:  OMNIPAQUE IOHEXOL 350 MG/ML SOLN COMPARISON:  None Available. FINDINGS: Cardiovascular: Satisfactory opacification of the pulmonary arteries to the segmental level. No evidence of pulmonary embolism. Moderate cardiomegaly is noted. No pericardial effusion. Mediastinum/Nodes: No enlarged mediastinal, hilar, or axillary lymph nodes. Thyroid gland, trachea, and esophagus demonstrate no significant findings. Lungs/Pleura: No pneumothorax is noted. Small right pleural effusion is noted with minimal adjacent subsegmental atelectasis. Minimal left basilar subsegmental atelectasis is noted. Upper Abdomen: No acute abnormality. Musculoskeletal: No chest wall abnormality. No acute or significant osseous findings. Review of the MIP images confirms the above findings. IMPRESSION: No definite evidence of pulmonary embolus. Moderate cardiomegaly. Small right pleural effusion. Minimal bibasilar subsegmental atelectasis. Electronically Signed   By: Lupita Raider M.D.   On: 02/07/2023 11:30    Cardiac Studies   Echocardiogram 02/07/2023:   1. Left ventricular ejection fraction, by estimation, is <20%. The left  ventricle has severely decreased function. The left ventricle demonstrates  global hypokinesis. The left ventricular internal cavity size was severely  dilated. Left ventricular  diastolic parameters are consistent with Grade III diastolic dysfunction  (restrictive). The  average left ventricular global longitudinal strain is  -4.8 %. The global longitudinal strain is abnormal.   2. Right ventricular systolic function is mildly reduced. The right  ventricular size is normal.   3. Left atrial size was severely dilated.   4. The mitral valve is normal in structure. Mild mitral valve  regurgitation. No evidence of mitral stenosis.   5. The aortic valve is tricuspid. Aortic valve regurgitation is not   visualized. No aortic stenosis is present.   6. The inferior vena cava is dilated in size with <50% respiratory  variability, suggesting right atrial pressure of 15 mmHg.   Patient Profile     52 y.o. female with acute systolic heart failure, EF less than 20% unknown etiology elevated troponin 700  Assessment & Plan    Acute systolic heart failure - EF less than 20 percent.  Cardiac catheterization right and left heart catheterization Monday, 02/10/2023.  BNP 1400 -IV diuresis 40 mg twice a day for now.  3.4 L out yesterday net.  Continue to optimize goal-directed medical therapy which includes Entresto Farxiga Coreg spironolactone -HIV negative TSH normal - I will start Toprol XL 25 mg.  Non-ST elevation myocardial infarction -Troponin up to 700.  This could be secondary to cardiomyopathy, i.e. type II MI.  Awaiting heart catheterization.  She is on IV heparin currently.  No chest pain.  Aspirin 81 mg.  Uncontrolled hypertension - Yesterday 141/113.  Improved now 115/86. This could be etiology for her heart failure.  Hypokalemia - Potassium 3.2, continue to replete.  Creatinine 0.95, stable    For questions or updates, please contact [facility] Please consult [email or web address] for contact info under        Signed, Donato Schultz, MD  02/09/2023, 9:44 AM

## 2023-02-09 NOTE — Progress Notes (Signed)
Cardiac catheterization consent signed by pt and placed in physical chart.

## 2023-02-09 NOTE — Progress Notes (Addendum)
Progress Note   Patient: Stephanie Reilly ZOX:[id number] DOB: 27-Aug-1971 DOA: 02/07/2023     1 DOS: the patient was seen and examined on 02/09/2023   Brief hospital course: Stephanie Reilly was admitted to the hospital with the working diagnosis of heart failure.   52 yo female with the past medical history of depression and anxiety, who presented with dyspnea. Reports about 4 weeks of worsening dyspnea on exertion, over the last several days she noted lower extremity edema. On the day of  hospitalization she had chest pain at rest that woke her up for sleep, prompting her to come to the Ed. On her initial physical examination her blood pressure was 142/110, HT 102, RR 26 and 02 saturation 93%, lungs with no wheezing or rales, heart with S1 and S2 present and rhythmic, abdomen with no distention and positive lower extremity edema.   Na 140, K 3.1 CL 108, bicarbonate 23, glucose 108, bun 15 cr 0,89  BNP 1,470 High sensitive troponin 29, 107, 292, 457. 727  Wbc 10,6 hgb 12.1 plt 293  Toxicology screen negative   Chest radiograph with cardiomegaly, with bilateral hilar vascular congestion.  CT chest with bilateral ground glass opacities, small right pleural effusion, no pulmonary embolism.   EKG 115 bpm, left axis deviation, normal intervals, sinus rhythm with PVC, no significant ST segment changes, negative T wave in V5 and V6.   Patient was placed on IV heparin for NSTEMI.,  Diuresis with furosemide.   05/05 patient responding well to diuresis. Plan for coronary angiography and right heart cath tomorrow.   Assessment and Plan: * Acute on chronic systolic CHF (congestive heart failure) (HCC) Echocardiogram with reduced LV systolic function < 20%, global hypokinesis, LV cavity with severe dilatation, no LVH, RV with preserved systolic function, LA with severe dilatation, trivial pericardial effusion, no significant valvular disease.   Urine output 3,600 ml Systolic blood pressure 79/62 and 91/69  mmHg.   Patient with improvement in volume status, now with hypotension. Plan to hold on IV furosemide for now.  Continue with Entresto, spironolactone.  and SGLT 2 inh.  Possible addition of B blocker prior to her discharge.  Right heart catheterization tomorrow.   Will need cardiac MRI.  Positive iron deficiency, will supplement with IV iron after cardiac MRI.   Elevated troponin likely due to heart failure, can not rule out NSTEMI, will continue heparin drip for now, and will follow up with coronary angiography.  Patient with no chest pain.  Essential hypertension Patient with improvement in volume status,  This am with hypotension.  Plan to continue with entresto (with holding paramenters) Continue with spironolactone  Possible addition of beta blocker prior to her discharge.   Diabetes mellitus type 2, diet-controlled (HCC) Fasting glucose is 115.  Continue insulin sliding scale for glucose cover and monitoring.   Hypokalemia Volume status has improved, renal function with serum cr at 0,95 with K at 3,2 and serum bicarbonate at 24,  Na 138 and Mg 2,2   Will hold on IV furosemide for now due to hypotension.  Continue with spironolactone and SGLT 2 inh.  Add Kcl 40 meq x2 doses and follow up renal function and electrolytes in am.   Obesity, Class III, BMI 40-49.9 (morbid obesity) (HCC) Calculated BMI is 45.1   Iron deficiency anemia Iron panel consistent with iron deficiency and anemia of chronic disease.   Serum iron of 38, TIBC 426, transferrin saturation 9 and ferritin 93.   Plan to add IV iron  after completing cardiac work up, she may need a cardiac MRI if she has a non ischemic cardiomyopathy.         Subjective: Patient is feeling better, no chest pain, no PND, no orthopnea and no lower extremity edema, through the course of the morning she has developed hypotension   Physical Exam: Vitals:   02/09/23 0525 02/09/23 0717 02/09/23 1139 02/09/23 1140  BP:  103/75 115/86 (!) 85/61 91/69  Pulse: 92 98 (!) 109 (!) 110  Resp: 20 18 18    Temp: 98.2 F (36.8 C) 98.2 F (36.8 C)    TempSrc: Oral Oral    SpO2: 93% 93% 93% 93%  Weight: 112.5 kg     Height:       Neurology awake and alert ENT with mild pallor Cardiovascular with S1 and S2 present and rhythmic with no gallops, rubs or murmurs No JVD No lower extremity edema Respiratory with no rales or wheezing Abdomen with no distention   Data Reviewed:    Family Communication: no family at the bedside   Disposition: Status is: Inpatient Remains inpatient appropriate because: pending to complete cardiac work up, on IV heparin   Planned Discharge Destination: Home      Author: Coralie Keens, MD 02/09/2023 12:08 PM  For on call review [email or web address].

## 2023-02-09 NOTE — Assessment & Plan Note (Signed)
Iron panel consistent with iron deficiency and anemia of chronic disease.   Serum iron of 38, TIBC 426, transferrin saturation 9 and ferritin 93.   Plan to add IV iron after completing cardiac work up, she may need a cardiac MRI if she has a non ischemic cardiomyopathy.

## 2023-02-09 NOTE — Progress Notes (Signed)
ANTICOAGULATION CONSULT NOTE - Follow-Up Consult  Pharmacy Consult for Heparin Indication: chest pain/ACS  Allergies  Allergen Reactions   Paroxetine Hcl Anaphylaxis   Prednisone Shortness Of Breath and Other (See Comments)    Confusion, "messes with mental," muscles feel weird, fatigue, insomnia   Chocolate    Ibuprofen    Iron Other (See Comments)   Latex    Lisinopril Hives   Naproxen    Tramadol Hives   Tylenol [Acetaminophen] Other (See Comments)    With Codeine; GI    Penicillins Rash   Serotonin Nausea Only, Swelling, Anxiety, Rash and Other (See Comments)    GI intolerence    Patient Measurements: Height: 5' 2.5" (158.8 cm) Weight: 112.5 kg (248 lb) IBW/kg (Calculated) : 51.25 Heparin Dosing Weight: 79.9 kg  Vital Signs: Temp: 98.2 F (36.8 C) (05/05 0525) Temp Source: Oral (05/05 0525) BP: 103/75 (05/05 0525) Pulse Rate: 92 (05/05 0525)  Labs: Recent Labs    02/07/23 0947 02/07/23 1157 02/07/23 1441 02/07/23 1738 02/07/23 1825 02/07/23 2001 02/08/23 0030 02/08/23 0624 02/08/23 1032 02/08/23 1756 02/09/23 0323  HGB 12.1  --   --   --   --   --  12.6  --   --   --  13.5  HCT 37.8  --   --   --   --   --  39.6  --   --   --  42.3  PLT 293  --   --   --   --   --  258  --   --   --  282  APTT  --   --   --   --   --  41*  --   --   --   --   --   LABPROT  --   --   --  14.9  --  15.8*  --   --   --   --   --   INR  --   --   --  1.2  --  1.3*  --   --   --   --   --   HEPARINUNFRC  --   --   --   --   --   --  <0.10*   < > 0.10* 0.17* 0.39  CREATININE 0.89  --   --   --   --   --  0.77  --  0.89  --  0.95  TROPONINIHS 29*   < > 292* 457* 727*  --   --   --   --   --   --    < > = values in this interval not displayed.    Estimated Creatinine Clearance: 82 mL/min (by C-G formula based on SCr of 0.95 mg/dL).   Medical History: Past Medical History:  Diagnosis Date   Anemia    Anxiety    Arthritis    Depression    Hypertension    Prediabetes      Assessment: 52 yo female presents with chest pain and reduced EF. Pharmacy consulted to dose IV heparin with plans for R and L heart cath 5/6.   Heparin level 0.38 on 1700 units/hr. Heparin level now therapeutic. Hgb 13.5 and plt 282, stable. No s/sx bleeding noted in chart.    Goal of Therapy:  Heparin level 0.3-0.7 units/ml Monitor platelets by anticoagulation protocol: Yes   Plan:  Continue heparin infusion at 1700 units/hr  Monitor daily heparin  level and CBC Continue to monitor H&H   F/u [facility] plans s/p cath Monday    Jerry Caras, PharmD PGY1 Pharmacy Resident   02/09/2023 7:28 AM

## 2023-02-10 ENCOUNTER — Other Ambulatory Visit (HOSPITAL_COMMUNITY): Payer: Self-pay

## 2023-02-10 ENCOUNTER — Encounter (HOSPITAL_COMMUNITY): Payer: Self-pay | Admitting: Cardiology

## 2023-02-10 ENCOUNTER — Encounter (HOSPITAL_COMMUNITY)
Admission: EM | Disposition: A | Discharge status: Home / Self Care | Payer: Self-pay | Source: Home / Self Care | Attending: Internal Medicine

## 2023-02-10 DIAGNOSIS — E119 Type 2 diabetes mellitus without complications: Secondary | ICD-10-CM | POA: Diagnosis not present

## 2023-02-10 DIAGNOSIS — I5023 Acute on chronic systolic (congestive) heart failure: Secondary | ICD-10-CM | POA: Diagnosis not present

## 2023-02-10 DIAGNOSIS — I472 Ventricular tachycardia, unspecified: Secondary | ICD-10-CM | POA: Diagnosis not present

## 2023-02-10 DIAGNOSIS — E876 Hypokalemia: Secondary | ICD-10-CM | POA: Diagnosis not present

## 2023-02-10 DIAGNOSIS — I5021 Acute systolic (congestive) heart failure: Secondary | ICD-10-CM

## 2023-02-10 DIAGNOSIS — I10 Essential (primary) hypertension: Secondary | ICD-10-CM | POA: Diagnosis not present

## 2023-02-10 DIAGNOSIS — I428 Other cardiomyopathies: Secondary | ICD-10-CM | POA: Diagnosis not present

## 2023-02-10 HISTORY — PX: RIGHT/LEFT HEART CATH AND CORONARY ANGIOGRAPHY: CATH118266

## 2023-02-10 LAB — POCT I-STAT EG7
Acid-Base Excess: 1 mmol/L (ref 0.0–2.0)
Acid-Base Excess: 1 mmol/L (ref 0.0–2.0)
Bicarbonate: 25.2 mmol/L (ref 20.0–28.0)
Bicarbonate: 25.5 mmol/L (ref 20.0–28.0)
Calcium, Ion: 1.33 mmol/L (ref 1.15–1.40)
Calcium, Ion: 1.35 mmol/L (ref 1.15–1.40)
HCT: 44 % (ref 36.0–46.0)
HCT: 45 % (ref 36.0–46.0)
Hemoglobin: 15 g/dL (ref 12.0–15.0)
Hemoglobin: 15.3 g/dL — ABNORMAL HIGH (ref 12.0–15.0)
O2 Saturation: 63 %
O2 Saturation: 63 %
Potassium: 4.3 mmol/L (ref 3.5–5.1)
Potassium: 4.3 mmol/L (ref 3.5–5.1)
Sodium: 140 mmol/L (ref 135–145)
Sodium: 140 mmol/L (ref 135–145)
TCO2: 26 mmol/L (ref 22–32)
TCO2: 27 mmol/L (ref 22–32)
pCO2, Ven: 39.5 mmHg — ABNORMAL LOW (ref 44–60)
pCO2, Ven: 39.8 mmHg — ABNORMAL LOW (ref 44–60)
pH, Ven: 7.413 (ref 7.25–7.43)
pH, Ven: 7.416 (ref 7.25–7.43)
pO2, Ven: 32 mmHg (ref 32–45)
pO2, Ven: 32 mmHg (ref 32–45)

## 2023-02-10 LAB — POCT ACTIVATED CLOTTING TIME
Activated Clotting Time: 190 seconds
Activated Clotting Time: 174 seconds
Activated Clotting Time: 179 seconds

## 2023-02-10 LAB — CBC
HCT: 43.1 % (ref 36.0–46.0)
Hemoglobin: 13.5 g/dL (ref 12.0–15.0)
MCH: 26.5 pg (ref 26.0–34.0)
MCHC: 31.3 g/dL (ref 30.0–36.0)
MCV: 84.7 fL (ref 80.0–100.0)
Platelets: 291 10*3/uL (ref 150–400)
RBC: 5.09 MIL/uL (ref 3.87–5.11)
RDW: 16.1 % — ABNORMAL HIGH (ref 11.5–15.5)
WBC: 10.4 10*3/uL (ref 4.0–10.5)
nRBC: 0 % (ref 0.0–0.2)

## 2023-02-10 LAB — HEPARIN LEVEL (UNFRACTIONATED)
Heparin Unfractionated: >1.1 IU/mL — ABNORMAL HIGH (ref 0.30–0.70)
Heparin Unfractionated: <0.1 IU/mL — ABNORMAL LOW (ref 0.30–0.70)
Heparin Unfractionated: 0.5 IU/mL (ref 0.30–0.70)

## 2023-02-10 LAB — POCT I-STAT 7, (LYTES, BLD GAS, ICA,H+H)
Acid-Base Excess: 0 mmol/L (ref 0.0–2.0)
Bicarbonate: 23.2 mmol/L (ref 20.0–28.0)
Calcium, Ion: 1.31 mmol/L (ref 1.15–1.40)
HCT: 44 % (ref 36.0–46.0)
Hemoglobin: 15 g/dL (ref 12.0–15.0)
O2 Saturation: 95 %
Potassium: 4.3 mmol/L (ref 3.5–5.1)
Sodium: 140 mmol/L (ref 135–145)
TCO2: 24 mmol/L (ref 22–32)
pCO2 arterial: 32.6 mmHg (ref 32–48)
pH, Arterial: 7.46 — ABNORMAL HIGH (ref 7.35–7.45)
pO2, Arterial: 71 mmHg — ABNORMAL LOW (ref 83–108)

## 2023-02-10 LAB — BASIC METABOLIC PANEL
Anion gap: 11 (ref 5–15)
BUN: 13 mg/dL (ref 6–20)
CO2: 22 mmol/L (ref 22–32)
Calcium: 9.3 mg/dL (ref 8.9–10.3)
Chloride: 105 mmol/L (ref 98–111)
Creatinine, Ser: 0.89 mg/dL (ref 0.44–1.00)
GFR, Estimated: >60 mL/min (ref >60)
Glucose, Bld: 112 mg/dL — ABNORMAL HIGH (ref 70–99)
Potassium: 4.1 mmol/L (ref 3.5–5.1)
Sodium: 138 mmol/L (ref 135–145)

## 2023-02-10 LAB — GLUCOSE, CAPILLARY
Glucose-Capillary: 91 mg/dL (ref 70–99)
Glucose-Capillary: 99 mg/dL (ref 70–99)
Glucose-Capillary: 95 mg/dL (ref 70–99)
Glucose-Capillary: 108 mg/dL — ABNORMAL HIGH (ref 70–99)

## 2023-02-10 LAB — MAGNESIUM: Magnesium: 2.2 mg/dL (ref 1.7–2.4)

## 2023-02-10 SURGERY — RIGHT/LEFT HEART CATH AND CORONARY ANGIOGRAPHY
Anesthesia: LOCAL

## 2023-02-10 MED ORDER — LIDOCAINE HCL (PF) 1 % IJ SOLN
INTRAMUSCULAR | Status: DC | PRN
  Administered 2023-02-10 (×3): 2 mL

## 2023-02-10 MED ORDER — SODIUM CHLORIDE 0.9% FLUSH: 3.0000 mL | INTRAVENOUS | Status: DC | PRN

## 2023-02-10 MED ORDER — SODIUM CHLORIDE 0.9% FLUSH
3.0000 mL | Freq: Two times a day (BID) | INTRAVENOUS | Status: DC
  Administered 2023-02-10 – 2023-02-12 (×3): 3 mL via INTRAVENOUS

## 2023-02-10 MED ORDER — FENTANYL CITRATE (PF) 100 MCG/2ML IJ SOLN
INTRAMUSCULAR | Status: AC
  Filled 2023-02-10: qty 2

## 2023-02-10 MED ORDER — MIDAZOLAM HCL 2 MG/2ML IJ SOLN
INTRAMUSCULAR | Status: AC
  Filled 2023-02-10: qty 2

## 2023-02-10 MED ORDER — HEPARIN SODIUM (PORCINE) 1000 UNIT/ML IJ SOLN
INTRAMUSCULAR | Status: AC
  Filled 2023-02-10: qty 10

## 2023-02-10 MED ORDER — HYDRALAZINE HCL 20 MG/ML IJ SOLN: 10.0000 mg | INTRAMUSCULAR | Status: AC | PRN

## 2023-02-10 MED ORDER — FENTANYL CITRATE (PF) 100 MCG/2ML IJ SOLN
INTRAMUSCULAR | Status: DC | PRN
  Administered 2023-02-10 (×2): 25 ug via INTRAVENOUS

## 2023-02-10 MED ORDER — LIDOCAINE HCL (PF) 1 % IJ SOLN
INTRAMUSCULAR | Status: AC
  Filled 2023-02-10: qty 30

## 2023-02-10 MED ORDER — IOHEXOL 350 MG/ML SOLN
INTRAVENOUS | Status: DC | PRN
  Administered 2023-02-10: 55 mL

## 2023-02-10 MED ORDER — MIDAZOLAM HCL 2 MG/2ML IJ SOLN
INTRAMUSCULAR | Status: DC | PRN
  Administered 2023-02-10: 1 mg via INTRAVENOUS
  Administered 2023-02-10: 2 mg via INTRAVENOUS

## 2023-02-10 MED ORDER — HEPARIN SODIUM (PORCINE) 1000 UNIT/ML IJ SOLN
INTRAMUSCULAR | Status: DC | PRN
  Administered 2023-02-10: 5000 [IU] via INTRAVENOUS

## 2023-02-10 MED ORDER — VERAPAMIL HCL 2.5 MG/ML IV SOLN
INTRAVENOUS | Status: DC | PRN
  Administered 2023-02-10: 10 mL via INTRA_ARTERIAL

## 2023-02-10 MED ORDER — SODIUM CHLORIDE 0.9 % IV SOLN: 250.0000 mL | INTRAVENOUS | Status: DC | PRN

## 2023-02-10 MED ORDER — VERAPAMIL HCL 2.5 MG/ML IV SOLN
INTRAVENOUS | Status: AC
  Filled 2023-02-10: qty 2

## 2023-02-10 MED ORDER — POTASSIUM CHLORIDE CRYS ER 20 MEQ PO TBCR
40.0000 meq | EXTENDED_RELEASE_TABLET | Freq: Once | ORAL | Status: AC
  Administered 2023-02-10: 40 meq via ORAL
  Filled 2023-02-10: qty 2

## 2023-02-10 MED ORDER — HEPARIN SODIUM (PORCINE) 5000 UNIT/ML IJ SOLN
5000.0000 [IU] | Freq: Three times a day (TID) | INTRAMUSCULAR | Status: DC
  Administered 2023-02-11 (×2): 5000 [IU] via SUBCUTANEOUS
  Filled 2023-02-10 (×2): qty 1

## 2023-02-10 MED ORDER — HEPARIN (PORCINE) IN NACL 1000-0.9 UT/500ML-% IV SOLN
INTRAVENOUS | Status: DC | PRN
  Administered 2023-02-10 (×2): 500 mL

## 2023-02-10 SURGICAL SUPPLY — 14 items
CATH 5FR JL3.5 JR4 ANG PIG MP (CATHETERS) IMPLANT
CATH BALLN WEDGE 5F 110CM (CATHETERS) IMPLANT
CATH SWAN GANZ 7F STRAIGHT (CATHETERS) IMPLANT
DEVICE RAD COMP TR BAND LRG (VASCULAR PRODUCTS) IMPLANT
GLIDESHEATH SLEND SS 6F .021 (SHEATH) IMPLANT
GUIDEWIRE .025 260CM (WIRE) IMPLANT
GUIDEWIRE INQWIRE 1.5J.035X260 (WIRE) IMPLANT
INQWIRE 1.5J .035X260CM (WIRE) ×1
KIT HEART LEFT (KITS) ×1 IMPLANT
PACK CARDIAC CATHETERIZATION (CUSTOM PROCEDURE TRAY) ×1 IMPLANT
SHEATH GLIDE SLENDER 4/5FR (SHEATH) IMPLANT
SHEATH PINNACLE 7F 10CM (SHEATH) IMPLANT
TRANSDUCER W/STOPCOCK (MISCELLANEOUS) ×1 IMPLANT
TUBING CIL FLEX 10 FLL-RA (TUBING) ×1 IMPLANT

## 2023-02-10 NOTE — Progress Notes (Signed)
[facility] night cross cover note:   I was notified by RN that this patient had a 40 beat run of sustained V. tach, during which time she was asymptomatic.  Subsequently returned to sinus rhythm.  Other vital signs stable, including blood pressure 110/84 mmHg.   BMP this AM notable for potassium level of 4.1.  I also added on serum magnesium level, which demonstrated 2.2.    Newton Pigg, DO Hospitalist

## 2023-02-10 NOTE — H&P (View-Only) (Signed)
 Rounding Note   Patient Name: Stephanie Reilly Date of Encounter: 02/10/2023  [facility] Cardiologist: None  Subjective   Recorded slightly positive yesterday (+469) - overall 7L Negative. Plan for L/RHC today. Noted to have a 40 beat run of VT - potassium and magnesium ok.  Weight is up 1 lb. Creatinine stable/lower at 0.89.  Inpatient Medications    Scheduled Meds:  aspirin  81 mg Oral Daily   dapagliflozin propanediol  10 mg Oral Daily   docusate sodium  100 mg Oral BID   insulin aspart  0-15 Units Subcutaneous TID WC   insulin aspart  0-5 Units Subcutaneous QHS   loratadine  10 mg Oral Daily   metoprolol succinate  25 mg Oral Daily   multivitamin with minerals  1 tablet Oral Daily   pneumococcal 20-valent conjugate vaccine  0.5 mL Intramuscular Tomorrow-1000   sacubitril-valsartan  1 tablet Oral BID   sodium chloride flush  3 mL Intravenous Q12H   sodium chloride flush  3 mL Intravenous Q12H   spironolactone  25 mg Oral Daily   Continuous Infusions:  sodium chloride     sodium chloride     heparin 1,850 Units/hr (02/10/23 0500)   PRN Meds: sodium chloride, bisacodyl, ondansetron **OR** ondansetron (ZOFRAN) IV, polyethylene glycol, sodium chloride flush, traZODone   Vital Signs    Vitals:   02/09/23 1937 02/10/23 0112 02/10/23 0450 02/10/23 0725  BP: 103/73 110/84 (!) 127/95 (!) 122/94  Pulse: (!) 109 (!) 103 (!) 104 (!) 105  Resp: 18 18 18 18  Temp: 98.2 F (36.8 C) 98.2 F (36.8 C) 98 F (36.7 C) 97.9 F (36.6 C)  TempSrc: Oral Oral Oral   SpO2: 96% 93% 93%   Weight:   113.4 kg   Height:        Intake/Output Summary (Last 24 hours) at 02/10/2023 0833 Last data filed at 02/10/2023 0450 Gross per 24 hour  Intake 769 ml  Output 300 ml  Net 469 ml      02/10/2023    4:50 AM 02/09/2023    5:25 AM 02/08/2023    6:01 AM  Last 3 Weights  Weight (lbs) 249 lb 14.4 oz 248 lb 250 lb 12.8 oz  Weight (kg) 113.354 kg 112.492 kg 113.762 kg      Telemetry     Sinus with 40 beat run of sustained VT overnight - Personally Reviewed  ECG   N/A  Physical Exam   General appearance: alert, no distress, and morbidly obese Neck: no carotid bruit, no JVD, and thyroid not enlarged, symmetric, no tenderness/mass/nodules Lungs: diminished breath sounds bibasilar Heart: regular rate and rhythm Abdomen: soft, non-tender; bowel sounds normal; no masses,  no organomegaly and obese Extremities: extremities normal, atraumatic, no cyanosis or edema Pulses: 2+ and symmetric Skin: Skin color, texture, turgor normal. No rashes or lesions Neurologic: Grossly normal Psych: Pleasant   Labs    High Sensitivity Troponin:   Recent Labs  Lab 02/07/23 0947 02/07/23 1157 02/07/23 1441 02/07/23 1738 02/07/23 1825  TROPONINIHS 29* 107* 292* 457* 727*     Chemistry Recent Labs  Lab 02/07/23 0947 02/08/23 0030 02/08/23 1032 02/09/23 0323 02/10/23 0104 02/10/23 0259  NA 140   < > 140 138 138  --   K 3.1*   < > 3.3* 3.2* 4.1  --   CL 108   < > 101 102 105  --   CO2 23   < > 27 24 22  --     GLUCOSE 139*   < > 114* 115* 112*  --   BUN 15   < > 8 13 13  --   CREATININE 0.89   < > 0.89 0.95 0.89  --   CALCIUM 9.0   < > 9.5 9.3 9.3  --   MG  --   --  2.2 2.2  --  2.2  PROT 7.3  --   --   --   --   --   ALBUMIN 3.5  --   --   --   --   --   AST 33  --   --   --   --   --   ALT 39  --   --   --   --   --   ALKPHOS 70  --   --   --   --   --   BILITOT 0.9  --   --   --   --   --   GFRNONAA >60   < > >60 >60 >60  --   ANIONGAP 9   < > 12 12 11  --    < > = values in this interval not displayed.    Lipids No results for input(s): "CHOL", "TRIG", "HDL", "LABVLDL", "LDLCALC", "CHOLHDL" in the last 168 hours.  Hematology Recent Labs  Lab 02/08/23 0030 02/09/23 0323 02/10/23 0104  WBC 10.5 10.3 10.4  RBC 4.69 5.04 5.09  HGB 12.6 13.5 13.5  HCT 39.6 42.3 43.1  MCV 84.4 83.9 84.7  MCH 26.9 26.8 26.5  MCHC 31.8 31.9 31.3  RDW 16.0* 16.0* 16.1*   PLT 258 282 291   Thyroid  Recent Labs  Lab 02/07/23 1738  TSH 1.459    BNP Recent Labs  Lab 02/07/23 0947  BNP 1,470.5*    DDimer No results for input(s): "DDIMER" in the last 168 hours.   Radiology    No results found.  Cardiac Studies   Echocardiogram 02/07/2023:   1. Left ventricular ejection fraction, by estimation, is <20%. The left  ventricle has severely decreased function. The left ventricle demonstrates  global hypokinesis. The left ventricular internal cavity size was severely  dilated. Left ventricular  diastolic parameters are consistent with Grade III diastolic dysfunction  (restrictive). The average left ventricular global longitudinal strain is  -4.8 %. The global longitudinal strain is abnormal.   2. Right ventricular systolic function is mildly reduced. The right  ventricular size is normal.   3. Left atrial size was severely dilated.   4. The mitral valve is normal in structure. Mild mitral valve  regurgitation. No evidence of mitral stenosis.   5. The aortic valve is tricuspid. Aortic valve regurgitation is not  visualized. No aortic stenosis is present.   6. The inferior vena cava is dilated in size with <50% respiratory  variability, suggesting right atrial pressure of 15 mmHg.   Patient Profile     51 y.o. female with acute systolic heart failure, EF less than 20% unknown etiology elevated troponin 700  Assessment & Plan    Acute systolic heart failure - EF less than 20 percent.  Cardiac catheterization right and left heart catheterization Monday, 02/10/2023.  BNP 1400 -IV diuresis 40 mg twice a day for now.  3.4 L out yesterday net.  Continue to optimize goal-directed medical therapy which includes Entresto Farxiga spironolactone and Toprol -HIV negative TSH normal  Non-ST elevation myocardial infarction -Troponin up to 700.  This could be   secondary to cardiomyopathy, i.e. type II MI.  Awaiting heart catheterization.  She is on IV heparin  currently.  No chest pain.  Aspirin 81 mg.  Uncontrolled hypertension - Yesterday 141/113.  Improved now 115/86. This could be etiology for her heart failure.  Hypokalemia - Potassium 4.1, given arrhythmias, will give extra 40 MEQ today- goal >4.5  Sustained VT - Had sustained 40 beat run of MMVT overnight. Asymptomatic. May improve if cath today shows reversible ischemia. Otherwise, given low LVEF, may ultimately need an AICD.   For questions or updates, please contact [facility] Please consult [email or web address] for contact info under   Nikki Glanzer C. Neena Beecham, MD, FACC, FACP    [facility]  Medical Director of the [facility] Diplomate of the American Board of Clinical Lipidology Attending Cardiologist  Direct Dial: [phone]  Fax: [phone]  Website:  [email or web address]  Ramaj Frangos C Mylinh Cragg, MD  02/10/2023, 8:33 AM   

## 2023-02-10 NOTE — Progress Notes (Signed)
7 fr sheath in right femoral vein pulled by Will, RN from 2H at 1615. This RN precepting sheath pull. Site is level 0 clean, dry, and intact. Right DP pulse detected by doppler prior to sheath removal. Manual pressure held at right femoral site for 15 minutes. Bed rest begins at 1630. Distal pulses detected by doppler

## 2023-02-10 NOTE — Progress Notes (Signed)
Patient Advocate Encounter   Received notification from Medicaid that prior authorization for [name] is required.   PA submitted on CoverMyMeds Key [id number]  Status is pending   Will continue to follow.

## 2023-02-10 NOTE — Progress Notes (Addendum)
ANTICOAGULATION CONSULT NOTE - Follow-Up Consult  Pharmacy Consult for Heparin Indication: chest pain/ACS  Allergies  Allergen Reactions   Paroxetine Hcl Anaphylaxis   Prednisone Shortness Of Breath and Other (See Comments)    Confusion, "messes with mental," muscles feel weird, fatigue, insomnia   Chocolate    Ibuprofen    Iron Other (See Comments)   Latex    Lisinopril Hives   Naproxen    Tramadol Hives   Tylenol [Acetaminophen] Other (See Comments)    With Codeine; GI    Penicillins Rash   Serotonin Nausea Only, Swelling, Anxiety, Rash and Other (See Comments)    GI intolerence    Patient Measurements: Height: 5' 2.5" (158.8 cm) Weight: 113.4 kg (249 lb 14.4 oz) IBW/kg (Calculated) : 51.25 Heparin Dosing Weight: 79.9 kg  Vital Signs: Temp: 97.9 F (36.6 C) (05/06 1131) Temp Source: Oral (05/06 0450) BP: 115/100 (05/06 1131) Pulse Rate: 107 (05/06 1131)  Labs: Recent Labs     0000 02/07/23 1441 02/07/23 1738 02/07/23 1825 02/07/23 2001 02/08/23 0030 02/08/23 0624 02/08/23 1032 02/08/23 1756 02/09/23 0323 02/10/23 0104 02/10/23 0259 02/10/23 1140  HGB   < >  --   --   --   --  12.6  --   --   --  13.5 13.5  --   --   HCT  --   --   --   --   --  39.6  --   --   --  42.3 43.1  --   --   PLT  --   --   --   --   --  258  --   --   --  282 291  --   --   APTT  --   --   --   --  41*  --   --   --   --   --   --   --   --   LABPROT  --   --  14.9  --  15.8*  --   --   --   --   --   --   --   --   INR  --   --  1.2  --  1.3*  --   --   --   --   --   --   --   --   HEPARINUNFRC  --   --   --   --   --  <0.10*   < > 0.10*   < > 0.39 >1.10* <0.10* 0.50  CREATININE   < >  --   --   --   --  0.77  --  0.89  --  0.95 0.89  --   --   TROPONINIHS  --  292* 457* 727*  --   --   --   --   --   --   --   --   --    < > = values in this interval not displayed.     Estimated Creatinine Clearance: 87.8 mL/min (by C-G formula based on SCr of 0.89 mg/dL).   Medical  History: Past Medical History:  Diagnosis Date   Anemia    Anxiety    Arthritis    Depression    Hypertension    Prediabetes     Assessment: 52 yo female presents with chest pain and reduced EF. Pharmacy consulted to dose IV heparin with plans for  R and L heart cath 5/6.   Heparin level 0.50 on 1850 units/hr. Heparin level now therapeutic. Hgb 13.5 and plt 291, stable. No s/sx bleeding noted in chart.   Patient is now in Cath lab for right/left heart cath and coronary angiography.  Goal of Therapy:  Heparin level 0.3-0.7 units/ml Monitor platelets by anticoagulation protocol: Yes   Plan:  Continue heparin infusion at 1850 units/hr  Monitor daily heparin level and CBC Continue to monitor H&H   Patient currently in cath lab.  F/u [facility] plans s/p cath today    Noah Delaine, RPh Clinical Pharmacist 02/10/2023 2:11 PM

## 2023-02-10 NOTE — Progress Notes (Signed)
Progress Note   Patient: Stephanie Reilly ZOX:[id number] DOB: [birth year] DOA: 02/07/2023     2 DOS: the patient was seen and examined on 02/10/2023   Brief hospital course: [name] was admitted to the hospital with the working diagnosis of heart failure.   52 yo female with the past medical history of depression and anxiety, who presented with dyspnea. Reports about 4 weeks of worsening dyspnea on exertion, over the last several days she noted lower extremity edema. On the day of  hospitalization she had chest pain at rest that woke her up for sleep, prompting her to come to the Ed. On her initial physical examination her blood pressure was 142/110, HT 102, RR 26 and 02 saturation 93%, lungs with no wheezing or rales, heart with S1 and S2 present and rhythmic, abdomen with no distention and positive lower extremity edema.   Na 140, K 3.1 CL 108, bicarbonate 23, glucose 108, bun 15 cr 0,89  BNP 1,470 High sensitive troponin 29, 107, 292, 457. 727  Wbc 10,6 hgb 12.1 plt 293  Toxicology screen negative   Chest radiograph with cardiomegaly, with bilateral hilar vascular congestion.  CT chest with bilateral ground glass opacities, small right pleural effusion, no pulmonary embolism.   EKG 115 bpm, left axis deviation, normal intervals, sinus rhythm with PVC, no significant ST segment changes, negative T wave in V5 and V6.   Patient was placed on IV heparin for NSTEMI.,  Diuresis with furosemide.   05/05 patient responding well to diuresis. Plan for coronary angiography and right heart cath tomorrow.  05/06 patient had 04 beat of VT. Self resolved.  Plan for catheterization today.   Assessment and Plan: * Acute on chronic systolic CHF (congestive heart failure) (HCC) Echocardiogram with reduced LV systolic function < 20%, global hypokinesis, LV cavity with severe dilatation, no LVH, RV with preserved systolic function, LA with severe dilatation, trivial pericardial effusion, no significant  valvular disease.   Her fluid balance is negative -7,146 ml, since admission.  Systolic blood pressure 110 to 120 mmHg.   Loop diuretic on hold due to hypotension, since admission she is 7, 146 ml, negative.   Continue with Entresto (holding parameters due to hypotension), spironolactone.  and SGLT 2 inh.  Metoprolol succinate (holding parameters for hypotension).  Right heart catheterization tomorrow.   Will need cardiac MRI of non ischemic cardiomyopathy.  Positive iron deficiency, will supplement with IV iron after possible cardiac MRI.   Elevated troponin likely due to heart failure, can not rule out NSTEMI, will continue heparin drip for now, and will follow up with coronary angiography.  Patient with no chest pain.  VT episodes, with stable blood pressure.  Plan to keep K at 4 and Mg at 2. Continue telemetry monitoring Follow up with cardiac catheterization.   Essential hypertension Blood pressure has improved, furosemide now on hold.  Clinically euvolemic.   Plan to continue with entresto (with holding paramenters) Continue with spironolactone and metoprolol succinate.   Diabetes mellitus type 2, diet-controlled (HCC) Fasting glucose is 112.  Continue insulin sliding scale for glucose cover and monitoring.   Hypokalemia Renal function stable, with serum cr at 0.89, K is 4,1 and serum bicarbonate at 22.  Na 138    Continue close follow up renal function and electrolytes. Keep K at 4 and Mg at 2.   Obesity, Class III, BMI 40-49.9 (morbid obesity) (HCC) Calculated BMI is 45.1   Iron deficiency anemia Iron panel consistent with iron deficiency and anemia  of chronic disease.   Serum iron of 38, TIBC 426, transferrin saturation 9 and ferritin 93.   Plan to add IV iron after completing cardiac work up, she may need a cardiac MRI if she has a non ischemic cardiomyopathy.         Subjective: patient is feeling better, no chest pain or dyspnea, edema has improved.    Physical Exam: Vitals:   02/09/23 1937 02/10/23 0112 02/10/23 0450 02/10/23 0725  BP: 103/73 110/84 (!) 127/95 (!) 122/94  Pulse: (!) 109 (!) 103 (!) 104 (!) 105  Resp: 18 18 18 18   Temp: 98.2 F (36.8 C) 98.2 F (36.8 C) 98 F (36.7 C) 97.9 F (36.6 C)  TempSrc: Oral Oral Oral   SpO2: 96% 93% 93%   Weight:   113.4 kg   Height:       Neurology awake and alert ENT with mild pallor Cardiovascular with S1 and S2 present and rhythmic with no gallops, rubs or murmurs No JVD No lower extremity edema Respiratory with no rales or wheezing, no rhonchi Abdomen with no distention  Data Reviewed:    Family Communication: no family at the bedside. Her sister on speaker phone and all questions addressed.   Disposition: Status is: Inpatient Remains inpatient appropriate because: cardiac work up, IV  heparin   Planned Discharge Destination: Home    Author: Coralie Keens, MD 02/10/2023 9:50 AM  For on call review [email or web address].

## 2023-02-10 NOTE — Progress Notes (Signed)
ANTICOAGULATION CONSULT NOTE - Follow-Up Consult  Pharmacy Consult for Heparin Indication: chest pain/ACS  Allergies  Allergen Reactions   Paroxetine Hcl Anaphylaxis   Prednisone Shortness Of Breath and Other (See Comments)    Confusion, "messes with mental," muscles feel weird, fatigue, insomnia   Chocolate    Ibuprofen    Iron Other (See Comments)   Latex    Lisinopril Hives   Naproxen    Tramadol Hives   Tylenol [Acetaminophen] Other (See Comments)    With Codeine; GI    Penicillins Rash   Serotonin Nausea Only, Swelling, Anxiety, Rash and Other (See Comments)    GI intolerence    Patient Measurements: Height: 5' 2.5" (158.8 cm) Weight: 112.5 kg (248 lb) IBW/kg (Calculated) : 51.25 Heparin Dosing Weight: 79.9 kg  Vital Signs: Temp: 98.2 F (36.8 C) (05/06 0112) Temp Source: Oral (05/06 0112) BP: 110/84 (05/06 0112) Pulse Rate: 103 (05/06 0112)  Labs: Recent Labs    02/07/23 1441 02/07/23 1738 02/07/23 1825 02/07/23 2001 02/08/23 0030 02/08/23 0624 02/08/23 1032 02/08/23 1756 02/09/23 0323 02/10/23 0104 02/10/23 0259  HGB  --   --   --   --  12.6  --   --   --  13.5 13.5  --   HCT  --   --   --   --  39.6  --   --   --  42.3 43.1  --   PLT  --   --   --   --  258  --   --   --  282 291  --   APTT  --   --   --  41*  --   --   --   --   --   --   --   LABPROT  --  14.9  --  15.8*  --   --   --   --   --   --   --   INR  --  1.2  --  1.3*  --   --   --   --   --   --   --   HEPARINUNFRC  --   --   --   --  <0.10*   < > 0.10*   < > 0.39 >1.10* <0.10*  CREATININE  --   --   --   --  0.77  --  0.89  --  0.95 0.89  --   TROPONINIHS 292* 457* 727*  --   --   --   --   --   --   --   --    < > = values in this interval not displayed.     Estimated Creatinine Clearance: 87.5 mL/min (by C-G formula based on SCr of 0.89 mg/dL).   Medical History: Past Medical History:  Diagnosis Date   Anemia    Anxiety    Arthritis    Depression    Hypertension     Prediabetes     Assessment: 52 yo female presents with chest pain and reduced EF. Pharmacy consulted to dose IV heparin with plans for R and L heart cath 5/6.   Heparin level 0.38 on 1700 units/hr. Heparin level now therapeutic. Hgb 13.5 and plt 282, stable. No s/sx bleeding noted in chart.    5/6 AM update:  Heparin level undetectable on re-draw (level of >1.1 was error)  Goal of Therapy:  Heparin level 0.3-0.7 units/ml Monitor platelets by anticoagulation  protocol: Yes   Plan:  Inc heparin to 1850 units/hr 1200 heparin level  Abran Duke, PharmD, BCPS Clinical Pharmacist Phone: [phone]

## 2023-02-10 NOTE — Progress Notes (Signed)
Report of 40 beats of V-tach from telemetry monitor tech.  Upon assessing she denies symptoms or complaints related to episode.  Dr. Marilynn Rail notified and new orders were placed in [facility] and carried out.  See his note for further details.

## 2023-02-10 NOTE — Progress Notes (Signed)
Rounding Note   Patient Name: Stephanie Reilly Date of Encounter: 02/10/2023  [facility] Cardiologist: None  Subjective   Recorded slightly positive yesterday (+469) - overall 7L Negative. Plan for [facility] today. Noted to have a 40 beat run of VT - potassium and magnesium ok.  Weight is up 1 lb. Creatinine stable/lower at 0.89.  Inpatient Medications    Scheduled Meds:  aspirin  81 mg Oral Daily   dapagliflozin propanediol  10 mg Oral Daily   docusate sodium  100 mg Oral BID   insulin aspart  0-15 Units Subcutaneous TID WC   insulin aspart  0-5 Units Subcutaneous QHS   loratadine  10 mg Oral Daily   metoprolol succinate  25 mg Oral Daily   multivitamin with minerals  1 tablet Oral Daily   pneumococcal 20-valent conjugate vaccine  0.5 mL Intramuscular Tomorrow-1000   sacubitril-valsartan  1 tablet Oral BID   sodium chloride flush  3 mL Intravenous Q12H   sodium chloride flush  3 mL Intravenous Q12H   spironolactone  25 mg Oral Daily   Continuous Infusions:  sodium chloride     sodium chloride     heparin 1,850 Units/hr (02/10/23 0500)   PRN Meds: sodium chloride, bisacodyl, ondansetron **OR** ondansetron (ZOFRAN) IV, polyethylene glycol, sodium chloride flush, traZODone   Vital Signs    Vitals:   02/09/23 1937 02/10/23 0112 02/10/23 0450 02/10/23 0725  BP: 103/73 110/84 (!) 127/95 (!) 122/94  Pulse: (!) 109 (!) 103 (!) 104 (!) 105  Resp: 18 18 18 18   Temp: 98.2 F (36.8 C) 98.2 F (36.8 C) 98 F (36.7 C) 97.9 F (36.6 C)  TempSrc: Oral Oral Oral   SpO2: 96% 93% 93%   Weight:   113.4 kg   Height:        Intake/Output Summary (Last 24 hours) at 02/10/2023 0833 Last data filed at 02/10/2023 0450 Gross per 24 hour  Intake 769 ml  Output 300 ml  Net 469 ml      02/10/2023    4:50 AM 02/09/2023    5:25 AM 02/08/2023    6:01 AM  Last 3 Weights  Weight (lbs) 249 lb 14.4 oz 248 lb 250 lb 12.8 oz  Weight (kg) 113.354 kg 112.492 kg 113.762 kg      Telemetry     Sinus with 40 beat run of sustained VT overnight - Personally Reviewed  ECG   N/A  Physical Exam   General appearance: alert, no distress, and morbidly obese Neck: no carotid bruit, no JVD, and thyroid not enlarged, symmetric, no tenderness/mass/nodules Lungs: diminished breath sounds bibasilar Heart: regular rate and rhythm Abdomen: soft, non-tender; bowel sounds normal; no masses,  no organomegaly and obese Extremities: extremities normal, atraumatic, no cyanosis or edema Pulses: 2+ and symmetric Skin: Skin color, texture, turgor normal. No rashes or lesions Neurologic: Grossly normal Psych: Pleasant   Labs    High Sensitivity Troponin:   Recent Labs  Lab 02/07/23 0947 02/07/23 1157 02/07/23 1441 02/07/23 1738 02/07/23 1825  TROPONINIHS 29* 107* 292* 457* 727*     Chemistry Recent Labs  Lab 02/07/23 0947 02/08/23 0030 02/08/23 1032 02/09/23 0323 02/10/23 0104 02/10/23 0259  NA 140   < > 140 138 138  --   K 3.1*   < > 3.3* 3.2* 4.1  --   CL 108   < > 101 102 105  --   CO2 23   < > 27 24 22   --  GLUCOSE 139*   < > 114* 115* 112*  --   BUN 15   < > 8 13 13   --   CREATININE 0.89   < > 0.89 0.95 0.89  --   CALCIUM 9.0   < > 9.5 9.3 9.3  --   MG  --   --  2.2 2.2  --  2.2  PROT 7.3  --   --   --   --   --   ALBUMIN 3.5  --   --   --   --   --   AST 33  --   --   --   --   --   ALT 39  --   --   --   --   --   ALKPHOS 70  --   --   --   --   --   BILITOT 0.9  --   --   --   --   --   GFRNONAA >60   < > >60 >60 >60  --   ANIONGAP 9   < > 12 12 11   --    < > = values in this interval not displayed.    Lipids No results for input(s): "CHOL", "TRIG", "HDL", "LABVLDL", "LDLCALC", "CHOLHDL" in the last 168 hours.  Hematology Recent Labs  Lab 02/08/23 0030 02/09/23 0323 02/10/23 0104  WBC 10.5 10.3 10.4  RBC 4.69 5.04 5.09  HGB 12.6 13.5 13.5  HCT 39.6 42.3 43.1  MCV 84.4 83.9 84.7  MCH 26.9 26.8 26.5  MCHC 31.8 31.9 31.3  RDW 16.0* 16.0* 16.1*   PLT 258 282 291   Thyroid  Recent Labs  Lab 02/07/23 1738  TSH 1.459    BNP Recent Labs  Lab 02/07/23 0947  BNP 1,470.5*    DDimer No results for input(s): "DDIMER" in the last 168 hours.   Radiology    No results found.  Cardiac Studies   Echocardiogram 02/07/2023:   1. Left ventricular ejection fraction, by estimation, is <20%. The left  ventricle has severely decreased function. The left ventricle demonstrates  global hypokinesis. The left ventricular internal cavity size was severely  dilated. Left ventricular  diastolic parameters are consistent with Grade III diastolic dysfunction  (restrictive). The average left ventricular global longitudinal strain is  -4.8 %. The global longitudinal strain is abnormal.   2. Right ventricular systolic function is mildly reduced. The right  ventricular size is normal.   3. Left atrial size was severely dilated.   4. The mitral valve is normal in structure. Mild mitral valve  regurgitation. No evidence of mitral stenosis.   5. The aortic valve is tricuspid. Aortic valve regurgitation is not  visualized. No aortic stenosis is present.   6. The inferior vena cava is dilated in size with <50% respiratory  variability, suggesting right atrial pressure of 15 mmHg.   Patient Profile     52 y.o. female with acute systolic heart failure, EF less than 20% unknown etiology elevated troponin 700  Assessment & Plan    Acute systolic heart failure - EF less than 20 percent.  Cardiac catheterization right and left heart catheterization Monday, 02/10/2023.  BNP 1400 -IV diuresis 40 mg twice a day for now.  3.4 L out yesterday net.  Continue to optimize goal-directed medical therapy which includes Clifton Custard spironolactone and Toprol -HIV negative TSH normal  Non-ST elevation myocardial infarction -Troponin up to 700.  This could be  secondary to cardiomyopathy, i.e. type II MI.  Awaiting heart catheterization.  She is on IV heparin  currently.  No chest pain.  Aspirin 81 mg.  Uncontrolled hypertension - Yesterday 141/113.  Improved now 115/86. This could be etiology for her heart failure.  Hypokalemia - Potassium 4.1, given arrhythmias, will give extra 40 MEQ today- goal >4.5  Sustained VT - Had sustained 40 beat run of MMVT overnight. Asymptomatic. May improve if cath today shows reversible ischemia. Otherwise, given low LVEF, may ultimately need an AICD.   For questions or updates, please contact [place] [facility] Please consult [email or web address] for contact info under   Chrystie Nose, MD, Milagros Loll  [place]  [facility]  Medical Director of the Advanced Lipid Disorders &  Cardiovascular Risk Reduction Clinic Diplomate of the American Board of Clinical Lipidology Attending Cardiologist  Direct Dial: [phone]  Fax: [phone]  Website:  [email or web address]  Chrystie Nose, MD  02/10/2023, 8:33 AM

## 2023-02-10 NOTE — Progress Notes (Signed)
   Heart Failure Stewardship Pharmacist Progress Note   PCP: [facility] PCP-Cardiologist: None    HPI:  52 YO female with a PMH of anxiety/depression  Patient presented to the [facility] ED on 5/3 with chest pain and shortness of breath. She reports progressive dyspnea on exertion since March 2024 and attributes this to both oral and injectable steroid use. The patients notes lower extremity edema starting a few days prior to admission and chest pain the morning of admission. Initial BNP 1470 and CXR consistent with mild pulmonary edema. Echo from 5/3 showing LVEF <20%, global hypokinesis, mild MVR. Troponin peaked at 727 on 5/3 but patient reports resolution of chest pain with continued diuresis. R/LHC is scheduled for 5/6. Patient reports previous diagnosis of HTN treated with lisinopril x2 doses but this medication was discontinued due to a negative reaction described as hives. It is thought that continued uncontrolled HTN could be the etiology for her heart failure.   Current HF Medications: Beta Blocker: metoprolol succinate 25 mg daily ACE/ARB/ARNI: Entresto 24-26 mg daily MRA: spironolactone 25 mg daily SGLT2i: dapagliflozin 10 mg daily  Prior to admission HF Medications: None  Pertinent Lab Values: Serum creatinine 0.89, BUN 13, Potassium 4.1, Sodium 138, BNP 1470, Magnesium 2.2, A1c 6.3%  Vital Signs: Weight: 249 lbs (admission weight: 257 lbs) Blood pressure: 120s/90s  Heart rate: 100s  I/O: + yesterday; net -7.1L  Medication Assistance / Insurance Benefits Check: Does the patient have prescription insurance?  Yes Type of insurance plan: Medicaid  Does the patient qualify for medication assistance through manufacturers or grants?   No Eligible grants and/or patient assistance programs: None Medication assistance applications in progress: None  Medication assistance applications approved: None   Outpatient Pharmacy:  Prior to admission  outpatient pharmacy: [facility] Is the patient willing to use [facility] TOC pharmacy at discharge? Yes Is the patient willing to transition their outpatient pharmacy to utilize a [facility] outpatient pharmacy?   Pending    Assessment: 1. Acute systolic CHF (LVEF <20%), due to ICM. NYHA class III symptoms. - Scr 0.95>>0.89 from yesterday, on furosemide IV 40 mg BID. Strict I/Os and daily weights. Keep K>4.5 given recent run of MMVT and Mg>2.  - Agree with stopping IV furosemide given net output of 7.1L and decreased UOP yesterday--consider transitioning to oral furosemide at discharge  - Continue spironolactone 25 mg daily  - Consider increasing metoprolol succinate to 50 mg daily  - Continue Entresto 24-26 mg daily - Continue Farxiga 10 mg daily    Plan: 1) Medication changes recommended at this time: - Consider increasing metoprolol succinate to 50 mg daily - Hold IV furosemide today and consider furosemide 40 mg PO daily at discharge   2) Patient assistance: - Prior authorization for Farxiga in process   3)  Education  - To be completed prior to discharge   Cherylin Mylar, PharmD PGY1 Pharmacy Resident 5/6/20248:30 AM

## 2023-02-10 NOTE — Interval H&P Note (Signed)
History and Physical Interval Note:  02/10/2023 2:01 PM  Stephanie Reilly  has presented today for surgery, with the diagnosis of new HF.  The various methods of treatment have been discussed with the patient and family. After consideration of risks, benefits and other options for treatment, the patient has consented to  Procedure(s): RIGHT/LEFT HEART CATH AND CORONARY ANGIOGRAPHY (N/A) as a surgical intervention.  The patient's history has been reviewed, patient examined, no change in status, stable for surgery.  I have reviewed the patient's chart and labs.  Questions were answered to the patient's satisfaction.   Cath Lab Visit (complete for each Cath Lab visit)  Clinical Evaluation Leading to the Procedure:   ACS: Yes.    Non-ACS:    Anginal Classification: CCS III  Anti-ischemic medical therapy: Minimal Therapy (1 class of medications)  Non-Invasive Test Results: No non-invasive testing performed  Prior CABG: No previous CABG        Theron Arista [facility] 02/10/2023 2:01 PM

## 2023-02-11 ENCOUNTER — Encounter (HOSPITAL_COMMUNITY): Payer: Self-pay | Admitting: Internal Medicine

## 2023-02-11 ENCOUNTER — Inpatient Hospital Stay (HOSPITAL_COMMUNITY): Payer: Medicaid Other

## 2023-02-11 DIAGNOSIS — D509 Iron deficiency anemia, unspecified: Secondary | ICD-10-CM

## 2023-02-11 DIAGNOSIS — I5023 Acute on chronic systolic (congestive) heart failure: Secondary | ICD-10-CM | POA: Diagnosis not present

## 2023-02-11 DIAGNOSIS — E119 Type 2 diabetes mellitus without complications: Secondary | ICD-10-CM | POA: Diagnosis not present

## 2023-02-11 DIAGNOSIS — I428 Other cardiomyopathies: Secondary | ICD-10-CM | POA: Diagnosis not present

## 2023-02-11 DIAGNOSIS — I5043 Acute on chronic combined systolic (congestive) and diastolic (congestive) heart failure: Secondary | ICD-10-CM | POA: Diagnosis not present

## 2023-02-11 DIAGNOSIS — I10 Essential (primary) hypertension: Secondary | ICD-10-CM | POA: Diagnosis not present

## 2023-02-11 DIAGNOSIS — I472 Ventricular tachycardia, unspecified: Secondary | ICD-10-CM | POA: Diagnosis not present

## 2023-02-11 DIAGNOSIS — E876 Hypokalemia: Secondary | ICD-10-CM | POA: Diagnosis not present

## 2023-02-11 LAB — BASIC METABOLIC PANEL
Anion gap: 11 (ref 5–15)
BUN: 14 mg/dL (ref 6–20)
CO2: 21 mmol/L — ABNORMAL LOW (ref 22–32)
Calcium: 9.5 mg/dL (ref 8.9–10.3)
Chloride: 104 mmol/L (ref 98–111)
Creatinine, Ser: 0.83 mg/dL (ref 0.44–1.00)
GFR, Estimated: >60 mL/min (ref >60)
Glucose, Bld: 98 mg/dL (ref 70–99)
Potassium: 3.8 mmol/L (ref 3.5–5.1)
Sodium: 136 mmol/L (ref 135–145)

## 2023-02-11 LAB — CBC
HCT: 43.4 % (ref 36.0–46.0)
Hemoglobin: 13.7 g/dL (ref 12.0–15.0)
MCH: 26.4 pg (ref 26.0–34.0)
MCHC: 31.6 g/dL (ref 30.0–36.0)
MCV: 83.6 fL (ref 80.0–100.0)
Platelets: 269 10*3/uL (ref 150–400)
RBC: 5.19 MIL/uL — ABNORMAL HIGH (ref 3.87–5.11)
RDW: 16.1 % — ABNORMAL HIGH (ref 11.5–15.5)
WBC: 8.3 10*3/uL (ref 4.0–10.5)
nRBC: 0 % (ref 0.0–0.2)

## 2023-02-11 LAB — GLUCOSE, CAPILLARY
Glucose-Capillary: 85 mg/dL (ref 70–99)
Glucose-Capillary: 99 mg/dL (ref 70–99)
Glucose-Capillary: 108 mg/dL — ABNORMAL HIGH (ref 70–99)
Glucose-Capillary: 118 mg/dL — ABNORMAL HIGH (ref 70–99)

## 2023-02-11 LAB — MAGNESIUM: Magnesium: 2.2 mg/dL (ref 1.7–2.4)

## 2023-02-11 MED ORDER — GADOBUTROL 1 MMOL/ML IV SOLN
10.0000 mL | Freq: Once | INTRAVENOUS | Status: AC | PRN
  Administered 2023-02-11: 10 mL via INTRAVENOUS

## 2023-02-11 MED ORDER — POTASSIUM CHLORIDE CRYS ER 20 MEQ PO TBCR
40.0000 meq | EXTENDED_RELEASE_TABLET | Freq: Once | ORAL | Status: AC
  Administered 2023-02-11: 40 meq via ORAL
  Filled 2023-02-11: qty 2

## 2023-02-11 MED ORDER — ENOXAPARIN SODIUM 40 MG/0.4ML IJ SOSY
40.0000 mg | PREFILLED_SYRINGE | INTRAMUSCULAR | Status: DC
  Administered 2023-02-11: 40 mg via SUBCUTANEOUS
  Filled 2023-02-11: qty 0.4

## 2023-02-11 MED ORDER — TORSEMIDE 20 MG PO TABS
40.0000 mg | ORAL_TABLET | Freq: Every day | ORAL | Status: DC
  Administered 2023-02-11 – 2023-02-12 (×2): 40 mg via ORAL
  Filled 2023-02-11 (×2): qty 2

## 2023-02-11 NOTE — Progress Notes (Addendum)
Progress Note   Patient: Stephanie Reilly ZOX:[id number] DOB: July 25, 1971 DOA: 02/07/2023     3 DOS: the patient was seen and examined on 02/11/2023   Brief hospital course: Stephanie Reilly was admitted to the hospital with the working diagnosis of heart failure.   52 yo female with the past medical history of depression and anxiety, who presented with dyspnea. Reports about 4 weeks of worsening dyspnea on exertion, over the last several days she noted lower extremity edema. On the day of  hospitalization she had chest pain at rest that woke her up for sleep, prompting her to come to the Ed. On her initial physical examination her blood pressure was 142/110, HT 102, RR 26 and 02 saturation 93%, lungs with no wheezing or rales, heart with S1 and S2 present and rhythmic, abdomen with no distention and positive lower extremity edema.   Na 140, K 3.1 CL 108, bicarbonate 23, glucose 108, bun 15 cr 0,89  BNP 1,470 High sensitive troponin 29, 107, 292, 457. 727  Wbc 10,6 hgb 12.1 plt 293  Toxicology screen negative   Chest radiograph with cardiomegaly, with bilateral hilar vascular congestion.  CT chest with bilateral ground glass opacities, small right pleural effusion, no pulmonary embolism.   EKG 115 bpm, left axis deviation, normal intervals, sinus rhythm with PVC, no significant ST segment changes, negative T wave in V5 and V6.   Patient was placed on IV heparin for NSTEMI.,  Diuresis with furosemide.   05/05 patient responding well to diuresis. Plan for coronary angiography and right heart cath tomorrow.  05/06 patient had 04 beat of VT. Self resolved.  Cardiac catheterization with non ischemic cardiomyopathy.  05/07 cardiac MRI and EP consultation for ventricular tachycardia.   Assessment and Plan: * Acute on chronic systolic CHF (congestive heart failure) (HCC) Echocardiogram with reduced LV systolic function < 20%, global hypokinesis, LV cavity with severe dilatation, no LVH, RV with preserved  systolic function, LA with severe dilatation, trivial pericardial effusion, no significant valvular disease.   05/06 cardiac catheterization.  Normal coronary anatomy.  PAP 42/22 mean 30 mmHg PCWP mean 20  LVEDP 21 mmHg Cardiac output 4,81 and index 2,26   Her fluid balance is negative -6,906 ml, since admission.  Systolic blood pressure 108 to 130 mmHg.   Continue with Entresto (holding parameters due to hypotension), spironolactone.  and SGLT 2 inh.  Metoprolol succinate (holding parameters for hypotension).  Resumed diuretic therapy with torsemide.  Elevated troponin likely due to heart failure, ruled out NSTEMI, discontinue heparin drip.   VT episodes, with stable blood pressure.  Plan to keep K at 4 and Mg at 2. Continue telemetry monitoring EP consultation recommended continue heart failure management.   Essential hypertension Blood pressure has improved.  Plan to continue with entresto (with holding paramenters) Continue with spironolactone and metoprolol succinate.  Loop diuretic with torsemide.   Diabetes mellitus type 2, diet-controlled (HCC) Fasting glucose is 98 mg/dl.  Continue insulin sliding scale for glucose cover and monitoring.   Hypokalemia Resume diuretic therapy with torsemide. Renal function with serum cr at 0,83 with K at 3,8 and serum bicarbonate at 21. Na 136 and Mg 2,2   Add 40 meq Kcl to prevent further hypokalemia.  Follow up renal function and electrolytes in am.   Obesity, Class III, BMI 40-49.9 (morbid obesity) (HCC) Calculated BMI is 45.1   Iron deficiency anemia Iron panel consistent with iron deficiency and anemia of chronic disease.   Serum iron of 38, TIBC  426, transferrin saturation 9 and ferritin 93.   Cardiac MRI has been completed. Iron listed as allergy, will check with pharmacy, if safe for IV iron, I suspect she had iron intolerance and not true allergy.         Subjective: Patient with no chest pain, dyspnea has  improved and edema has resolved, no palpitations   Physical Exam: Vitals:   02/11/23 0423 02/11/23 0711 02/11/23 1132 02/11/23 1618  BP: 98/76 117/84 (!) 130/97 108/79  Pulse: 94 98 (!) 114 84  Resp: 19 18 18 18   Temp: 98 F (36.7 C) 98.1 F (36.7 C) 98.3 F (36.8 C) 98 F (36.7 C)  TempSrc: Oral Oral Oral Oral  SpO2: 96% 96% 97% 98%  Weight: 113 kg     Height:       Neurology awake and alert ENT with mild pallor Cardiovascular with S1 and S2 present and rhythmic with no gallops, rubs or murmurs No JVD No lower extremity edema Respiratory with no rales or wheezing, no rhonchi Abdomen with no distention  Data Reviewed:    Family Communication: no family at the bedside   Disposition: Status is: Inpatient Remains inpatient appropriate because: heart failure   Planned Discharge Destination: Home     Author: Coralie Keens, MD 02/11/2023 4:49 PM  For on call review [email or web address].

## 2023-02-11 NOTE — Progress Notes (Signed)
  Transition of Care [facility]) Screening Note   Patient Details  Name: Reynah Oestreich Date of Birth: 09-Sep-1971   Transition of Care [facility]) CM/SW Contact:    Leone Haven, RN Phone Number: 02/11/2023, 4:34 PM    Transition of Care Department [facility]) has reviewed patient , presents with a/c CHF. We will continue to monitor patient advancement through interdisciplinary progression rounds. If new patient transition needs arise, please place a TOC consult.

## 2023-02-11 NOTE — Progress Notes (Signed)
   02/11/23 1600  Spiritual Encounters  Type of Visit Declined chaplain visit   Ch responded to request for AD education. Pt's wife was at bedside. Pt declined visit. Wife said that pt has already have an AD in his chart. No follow-up needed at this time.

## 2023-02-11 NOTE — Progress Notes (Signed)
Rounding Note   Patient Name: Stephanie Reilly Date of Encounter: 02/11/2023  [facility] Cardiologist: None  Subjective   Some more monomorphic NSVT yesterday. Cath yesterday showed normal coronary anatomy. Mildly elevated LV filling pressures with moderately reduced cardiac output.  Inpatient Medications    Scheduled Meds:  aspirin  81 mg Oral Daily   dapagliflozin propanediol  10 mg Oral Daily   docusate sodium  100 mg Oral BID   heparin  5,000 Units Subcutaneous Q8H   insulin aspart  0-15 Units Subcutaneous TID WC   insulin aspart  0-5 Units Subcutaneous QHS   loratadine  10 mg Oral Daily   metoprolol succinate  25 mg Oral Daily   multivitamin with minerals  1 tablet Oral Daily   pneumococcal 20-valent conjugate vaccine  0.5 mL Intramuscular Tomorrow-1000   sacubitril-valsartan  1 tablet Oral BID   sodium chloride flush  3 mL Intravenous Q12H   sodium chloride flush  3 mL Intravenous Q12H   sodium chloride flush  3 mL Intravenous Q12H   spironolactone  25 mg Oral Daily   Continuous Infusions:  sodium chloride     PRN Meds: sodium chloride, bisacodyl, ondansetron **OR** ondansetron (ZOFRAN) IV, polyethylene glycol, sodium chloride flush, traZODone   Vital Signs    Vitals:   02/10/23 1915 02/10/23 2344 02/11/23 0423 02/11/23 0711  BP: 114/69 99/75 98/76  117/84  Pulse:  98 94 98  Resp: 16 17 19 18   Temp: 98.8 F (37.1 C) 98 F (36.7 C) 98 F (36.7 C) 98.1 F (36.7 C)  TempSrc: Oral Oral Oral Oral  SpO2:  97% 96% 96%  Weight:   113 kg   Height:        Intake/Output Summary (Last 24 hours) at 02/11/2023 0830 Last data filed at 02/10/2023 1833 Gross per 24 hour  Intake 120 ml  Output --  Net 120 ml      02/11/2023    4:23 AM 02/10/2023    4:50 AM 02/09/2023    5:25 AM  Last 3 Weights  Weight (lbs) 249 lb 1.6 oz 249 lb 14.4 oz 248 lb  Weight (kg) 112.991 kg 113.354 kg 112.492 kg      Telemetry    Sinus with recurrent runs of NSVT - Personally  Reviewed  ECG   N/A  Physical Exam   General appearance: alert, no distress, and morbidly obese Neck: no carotid bruit, no JVD, and thyroid not enlarged, symmetric, no tenderness/mass/nodules Lungs: diminished breath sounds bibasilar Heart: regular rate and rhythm Abdomen: soft, non-tender; bowel sounds normal; no masses,  no organomegaly and obese Extremities: extremities normal, atraumatic, no cyanosis or edema Pulses: 2+ and symmetric Skin: Skin color, texture, turgor normal. No rashes or lesions Neurologic: Grossly normal Psych: Pleasant   Labs    High Sensitivity Troponin:   Recent Labs  Lab 02/07/23 0947 02/07/23 1157 02/07/23 1441 02/07/23 1738 02/07/23 1825  TROPONINIHS 29* 107* 292* 457* 727*     Chemistry Recent Labs  Lab 02/07/23 0947 02/08/23 0030 02/09/23 0323 02/10/23 0104 02/10/23 0259 02/10/23 1442 02/10/23 1450 02/10/23 1451 02/11/23 0103  NA 140   < > 138 138  --    < > 140 140 136  K 3.1*   < > 3.2* 4.1  --    < > 4.3 4.3 3.8  CL 108   < > 102 105  --   --   --   --  104  CO2 23   < >  24 22  --   --   --   --  21*  GLUCOSE 139*   < > 115* 112*  --   --   --   --  98  BUN 15   < > 13 13  --   --   --   --  14  CREATININE 0.89   < > 0.95 0.89  --   --   --   --  0.83  CALCIUM 9.0   < > 9.3 9.3  --   --   --   --  9.5  MG  --    < > 2.2  --  2.2  --   --   --  2.2  PROT 7.3  --   --   --   --   --   --   --   --   ALBUMIN 3.5  --   --   --   --   --   --   --   --   AST 33  --   --   --   --   --   --   --   --   ALT 39  --   --   --   --   --   --   --   --   ALKPHOS 70  --   --   --   --   --   --   --   --   BILITOT 0.9  --   --   --   --   --   --   --   --   GFRNONAA >60   < > >60 >60  --   --   --   --  >60  ANIONGAP 9   < > 12 11  --   --   --   --  11   < > = values in this interval not displayed.    Lipids No results for input(s): "CHOL", "TRIG", "HDL", "LABVLDL", "LDLCALC", "CHOLHDL" in the last 168 hours.  Hematology Recent  Labs  Lab 02/09/23 0323 02/10/23 0104 02/10/23 1442 02/10/23 1450 02/10/23 1451 02/11/23 0103  WBC 10.3 10.4  --   --   --  8.3  RBC 5.04 5.09  --   --   --  5.19*  HGB 13.5 13.5   < > 15.0 15.3* 13.7  HCT 42.3 43.1   < > 44.0 45.0 43.4  MCV 83.9 84.7  --   --   --  83.6  MCH 26.8 26.5  --   --   --  26.4  MCHC 31.9 31.3  --   --   --  31.6  RDW 16.0* 16.1*  --   --   --  16.1*  PLT 282 291  --   --   --  269   < > = values in this interval not displayed.   Thyroid  Recent Labs  Lab 02/07/23 1738  TSH 1.459    BNP Recent Labs  Lab 02/07/23 0947  BNP 1,470.5*    DDimer No results for input(s): "DDIMER" in the last 168 hours.   Radiology    CARDIAC CATHETERIZATION  Result Date: 02/10/2023   LV end diastolic pressure is mildly elevated.   Hemodynamic findings consistent with mild pulmonary hypertension. Normal coronary anatomy Mild LV filling pressures. PCWP 23/21 with mean 20 mm Hg. LVEDP 21 mm  Hg Mild pulmonary HTN PAP 42/22 with mean 30 mm Hg Cardiac output 4.81 L/min with index 2.26. Plan: optimize CHF therapy    Cardiac Studies   Echocardiogram 02/07/2023:   1. Left ventricular ejection fraction, by estimation, is <20%. The left  ventricle has severely decreased function. The left ventricle demonstrates  global hypokinesis. The left ventricular internal cavity size was severely  dilated. Left ventricular  diastolic parameters are consistent with Grade III diastolic dysfunction  (restrictive). The average left ventricular global longitudinal strain is  -4.8 %. The global longitudinal strain is abnormal.   2. Right ventricular systolic function is mildly reduced. The right  ventricular size is normal.   3. Left atrial size was severely dilated.   4. The mitral valve is normal in structure. Mild mitral valve  regurgitation. No evidence of mitral stenosis.   5. The aortic valve is tricuspid. Aortic valve regurgitation is not  visualized. No aortic stenosis is  present.   6. The inferior vena cava is dilated in size with <50% respiratory  variability, suggesting right atrial pressure of 15 mmHg.   Patient Profile     52 y.o. female with acute systolic heart failure, EF less than 20% unknown etiology elevated troponin 700  Assessment & Plan    Acute systolic heart failure - EF less than 20 percent., non-ischemic by cath. CO 4.8L/min - 7L negative, however, remains volume up based on RHC.  Continue to optimize goal-directed medical therapy which includes Entresto Farxiga spironolactone and Toprol.  - Start torsemide 40 mg daily po.  -HIV negative TSH normal - Recurrent NSVT, monomorphic, no CAD - will ask EP to evaluate for AICD or possible LifeVest  Non-ST elevation myocardial infarction -Troponin up to 700, however, cath showed no CAD -heparin switched to prophylaxis  Uncontrolled hypertension - BP improved  Hypokalemia - Potassium 3.8 today  Sustained VT - Had sustained 40 beat run of MMVT and shorter NSVT runs as well. Asymptomatic. No CAD.   -will ask cardiac EP to evaluate for AICD vs lifevest bridge  For questions or updates, please contact [place] [facility] Please consult [email or web address] for contact info under   Chrystie Nose, MD, Milagros Loll  [place]  [facility]  Medical Director of the Advanced Lipid Disorders &  Cardiovascular Risk Reduction Clinic Diplomate of the American Board of Clinical Lipidology Attending Cardiologist  Direct Dial: [phone]  Fax: [phone]  Website:  [email or web address]  Chrystie Nose, MD  02/11/2023, 8:30 AM

## 2023-02-11 NOTE — Consult Note (Addendum)
ELECTROPHYSIOLOGY CONSULT NOTE    Patient ID: Stephanie Reilly MRN: [id number], DOB/AGE: 04-22-1971 52 y.o.  Admit date: 02/07/2023 Date of Consult: 02/11/2023  Primary Physician: [facility] Primary Cardiologist: None  Electrophysiologist: New   Referring Provider: Dr. Rennis Golden  Patient Profile: Stephanie Reilly is a 52 y.o. female with h/o anemia, HTN, depression, and pre-diabetes, but with no prior cardiac history who is being seen today for the evaluation of acute systolic CHF and NSVT at the request of Dr. Rennis Golden.  HPI:  Stephanie Reilly is a 52 y.o. female who presented to [facility] 5/3 with CP and SOB, progressive since March, potentially on-going since October when she had a viral illness. Had mild peripheral edema.   She had sudden chest tightness that awoke her from sleep. Denied radiation, nausea, vomiting, or diaphoresis.   Pertinent labs on arrival include Cr 0.89, K 3.1, WBC 10.6, Hgb 12.1, normal LFTs. HS trop peaked at 727.  Given IV lasix with improvement in symptoms.   Echo 5/3 showed new systolic CHF at <20%, global HK, grade III DD, RV mildly reduced, milr MR, severe LAE, RA pressure 15 mmHg  L/RHC 5/6 Normal cors Mild LV filling pressures. PCWP 23/21 with mean 20 mm Hg. LVEDP 21 mm Hg Mild pulmonary HTN PAP 42/22 with mean 30 mm Hg Cardiac output 4.81 L/min with index 2.26.  Noted to have frequent PVCs and NSVT that have improved with BB therapy. Given >40 beat run of NSVT and new CHF, EP asked to evaluate for ICD vs Lifevest.   Pt currently asymptomatic at rest. Denies symptoms for NSVT. No h/o palpitations or syncope. She had an uncle who passed away at 6 months due to "crib death" and another who passed away from drowning at a young age, but was associated with epileptic seizures.  Labs Potassium3.8 (05/07 0103) Magnesium  2.2 (05/07 0103) Creatinine, ser  0.83 (05/07 0103) PLT  269 (05/07 0103) HGB  13.7 (05/07 0103) WBC 8.3 (05/07 0103)   .    Allergies, Medical, Surgical, Social, and Family Histories have been reviewed and are referenced here-in when relevant for medical decision making.   Inpatient Medications:   aspirin  81 mg Oral Daily   dapagliflozin propanediol  10 mg Oral Daily   docusate sodium  100 mg Oral BID   heparin  5,000 Units Subcutaneous Q8H   insulin aspart  0-15 Units Subcutaneous TID WC   insulin aspart  0-5 Units Subcutaneous QHS   loratadine  10 mg Oral Daily   metoprolol succinate  25 mg Oral Daily   multivitamin with minerals  1 tablet Oral Daily   pneumococcal 20-valent conjugate vaccine  0.5 mL Intramuscular Tomorrow-1000   potassium chloride  40 mEq Oral Once   sacubitril-valsartan  1 tablet Oral BID   sodium chloride flush  3 mL Intravenous Q12H   sodium chloride flush  3 mL Intravenous Q12H   sodium chloride flush  3 mL Intravenous Q12H   spironolactone  25 mg Oral Daily   torsemide  40 mg Oral Daily   Physical Exam: Vitals:   02/10/23 1915 02/10/23 2344 02/11/23 0423 02/11/23 0711  BP: 114/69 99/75 98/76  117/84  Pulse:  98 94 98  Resp: 16 17 19 18   Temp: 98.8 F (37.1 C) 98 F (36.7 C) 98 F (36.7 C) 98.1 F (36.7 C)  TempSrc: Oral Oral Oral Oral  SpO2:  97% 96% 96%  Weight:   113 kg   Height:  GEN- NAD, A&O x 3, normal affect HEENT: Normocephalic, atraumatic Lungs- CTAB, Normal effort.  Heart- Regular rate and rhythm, No M/G/R.  GI- Soft, NT, ND.  Extremities- No clubbing, cyanosis, or edema   EKG: on arrival showed sinus tachycardia at 115 bpm (personally reviewed)  TELEMETRY: NSR / Sinus tach 90-100s (personally reviewed)    Assessment/Plan:  PVCs NSVT/MMVT Asymptomatic and hemodynamically stable.  Burden has significantly improved on BB Continue to titrate GDMT.  Would plan optimization of her HF and GDMT, with echo in 3-4 months and consideration of ICD if EF does not improve  With MMVT and low EF would recommend cMRI  Acute systolic CHF Continue  to titrate GDMT as tolerated  Continue toprol 25 mg daily , titrate as tolerated Continue farxiga 10 mg daily Continue entresto 24/26 mg daily Continue spironolactone 25 mg daily With NICM and family history would recommend cMRI  Poorly/uncontrolled HTN Meds in the setting of treating her CHF  Dr. Elberta Fortis has seen. Would plan GDMT and outpatient follow up unless cMRI markedly abnormal.   For questions or updates, please contact [facility] Please consult [email or web address] for contact info under Cardiology/STEMI.  Signed, Graciella Freer, PA-C  02/11/2023 9:41 AM    I have seen and examined this patient with Otilio Saber.  Agree with above, note added to reflect my findings.  Patient admitted to the hospital with new onset chronic systolic heart failure.  She has been diuresed.  She is currently feeling much improved.  She presented with significant shortness of breath and chest discomfort.  Left heart catheterization showed no coronary artery disease.  While in the hospital, she had a greater than 40 beat run of nonsustained ventricular tachycardia.  This was early in her hospital stay.  She was asymptomatic, sleeping at the time.  She has now been started on optimal medical therapy for her heart failure and has had brisk diuresis.  He has had no further ventricular arrhythmias.  GEN: Well nourished, well developed, in no acute distress  HEENT: normal  Neck: no JVD, carotid bruits, or masses Cardiac: RRR; no murmurs, rubs, or gallops,no edema  Respiratory:  clear to auscultation bilaterally, normal work of breathing GI: soft, nontender, nondistended, + BS MS: no deformity or atrophy  Skin: warm and dry Neuro:  Strength and sensation are intact Psych: euthymic mood, full affect   Ventricular tachycardia: Occurred in the setting of acute heart failure.  She is now on optimal medical therapy and has diuresed briskly.  At this time, would hold off on ICD therapy.  Despite this, due  to her nonischemic cardiomyopathy and ventricular tachycardia, cardiac MRI would be reasonable. Chronic systolic heart failure: Patient respiratory status is much improved.  Continue optimal medical therapy per primary cardiology.  Cantrell Larouche M. Anthem Frazer MD 02/11/2023 3:54 PM

## 2023-02-11 NOTE — Progress Notes (Signed)
Heart Failure Nurse Navigator Progress Note  PCP: [facility] PCP-Cardiologist: None Admission Diagnosis: Shortness of Breath, Hypervolemia.  Admitted from: Home  Presentation:   Stephanie Reilly presented with left sided chest pain, shortness of breath, BLE edema. BP 145/108, HR 112, BNP 1470, Troponin 727, IV lasix given, EKG showed tachycardic, no ischemic changes, CXR signs of fluid overload, CT negative for PE. Cath on 5/6 showed normal coronary anatomy, mildly elevated LV filling pressures, had some more monomorphic NSVT on 5/6. EP consulted for possible LifeVest or AICD.   Patient educated on the sign and symptoms of heart failure, daily weights, when to call her doctor or go to the ED, Diet/ fluid restrictions, taking all her medications as prescribed and attending all medical appointments. Patient verbalized her understanding , a HF TOC appointment was scheduled for 02/28/2023 @ 2 pm.   ECHO/ LVEF: <20%  Clinical Course:  Past Medical History:  Diagnosis Date   Anemia    Anxiety    Arthritis    Depression    Hypertension    Prediabetes      Social History   Socioeconomic History   Marital status: Single    Spouse name: Not on file   Number of children: Not on file   Years of education: Not on file   Highest education level: Not on file  Occupational History   Occupation: unemployed  Tobacco Use   Smoking status: Former    Packs/day: 0.50    Years: 11.00    Additional pack years: 0.00    Total pack years: 5.50    Types: Cigarettes    Quit date: 10/2021    Years since quitting: 1.3   Smokeless tobacco: Never  Substance and Sexual Activity   Alcohol use: Never   Drug use: Yes    Types: Marijuana    Comment: routine use of CBD edibles   Sexual activity: Not on file  Other Topics Concern   Not on file  Social History Narrative   Not on file   Social Determinants of Health   Financial Resource Strain: Not on file  Food Insecurity:  No Food Insecurity (02/08/2023)   Hunger Vital Sign    Worried About Running Out of Food in the Last Year: Never true    Ran Out of Food in the Last Year: Never true  Transportation Needs: No Transportation Needs (02/08/2023)   PRAPARE - Administrator, Civil Service (Medical): No    Lack of Transportation (Non-Medical): No  Physical Activity: Not on file  Stress: Not on file  Social Connections: Not on file   Education Assessment and Provision:  Detailed education and instructions provided on heart failure disease management including the following:  Signs and symptoms of Heart Failure When to call the physician Importance of daily weights Low sodium diet Fluid restriction Medication management Anticipated future follow-up appointments  Patient education given on each of the above topics.  Patient acknowledges understanding via teach back method and acceptance of all instructions.  Education Materials:  "Living Better With Heart Failure" Booklet, HF zone tool, & Daily Weight Tracker Tool.  Patient has scale at home: yes Patient has pill box at home: yes    High Risk Criteria for Readmission and/or Poor Patient Outcomes: Heart failure hospital admissions (last 6 months): 0  No Show rate: 0 Difficult social situation: No Demonstrates medication adherence: yes Primary Language: English Literacy level: Reading, writing, and comprehension  Barriers of Care:  New EF <20% HFrEF Diet/ fluid restrictions/ daily weights Continued eduction on HF and medications  Considerations/Referrals:   Referral made to Heart Failure Pharmacist Stewardship: yes Referral made to Heart Failure CSW/NCM TOC: Yes, wants to apply for disability, currently self employed, daycare.  Referral made to Heart & Vascular TOC clinic: Yes, 02/28/2023 @ 2 pm  Items for Follow-up on DC/TOC: New EF <20% Diet/ fluids/ daily weights Continued HF and medication education.    Rhae Hammock, BSN,  Scientist, clinical (histocompatibility and immunogenetics) Only

## 2023-02-11 NOTE — Progress Notes (Signed)
   Heart Failure Stewardship Pharmacist Progress Note   PCP: [facility] PCP-Cardiologist: None    HPI:  52 YO female with a PMH of anxiety/depression  Patient presented to the [facility] ED on 5/3 with chest pain and shortness of breath. She reports progressive dyspnea on exertion since March 2024 and attributes this to both oral and injectable steroid use. The patients notes lower extremity edema starting a few days prior to admission and chest pain the morning of admission. Initial BNP 1470 and CXR consistent with mild pulmonary edema. Echo from 5/3 showing LVEF <20%, global hypokinesis, mild MVR. Troponin peaked at 727 on 5/3 but patient reports resolution of chest pain with continued diuresis. R/LHC from 5/6 is nonischemic and showing PCWP 20 mmHg, LVEDP 21 mmHg, PAP 30 mmHg, CO/CI 4.81/2.26--indicating continued volume overload. Patient reports previous diagnosis of HTN treated with lisinopril x2 doses but this medication was discontinued due to a negative reaction described as hives. It is thought that continued uncontrolled HTN could be the etiology for her heart failure.   Current HF Medications: Beta Blocker: metoprolol succinate 25 mg daily ACE/ARB/ARNI: Entresto 24-26 mg daily MRA: spironolactone 25 mg daily SGLT2i: dapagliflozin 10 mg daily  Prior to admission HF Medications: None  Pertinent Lab Values: Serum creatinine 0.83, BUN 14, Potassium 3.8, Sodium 136, BNP 1470, Magnesium 2.2, A1c 6.3%, iron 38, saturation 9%, ferritin 93  Vital Signs: Weight: 249 lbs (admission weight: 257 lbs) Blood pressure: 100s/70s  Heart rate: 90-100s  I/O: + 20mL yesterday; net -7L  Medication Assistance / Insurance Benefits Check: Does the patient have prescription insurance?  Yes Type of insurance plan: Medicaid  Outpatient Pharmacy:  Prior to admission outpatient pharmacy: [facility] Is the patient willing to use [facility] TOC pharmacy at discharge? Yes Is the patient  willing to transition their outpatient pharmacy to utilize a [facility] outpatient pharmacy?   Pending   Assessment: 1. Acute systolic CHF (LVEF <20%), due to ICM. NYHA class III symptoms. - Scr stable at 0.83 today--Lasix held yesterday. Strict I/Os and daily weights. Keep K>4.5 given runs of MMVT and Mg>2. Recommend KCl PO x1 today.  - Agree with starting torsemide 40 mg daily given signs of continued volume overload from [facility] yesterday  - Continue spironolactone 25 mg daily  - Consider increasing metoprolol succinate to 50 mg daily for continued VT control   - Continue Entresto 24-26 mg daily - Continue Farxiga 10 mg daily - Agree with giving IV iron today once MRI complete    Plan: 1) Medication changes recommended at this time: - Start torsemide 40 mg daily  - Consider increasing metoprolol succinate to 50 mg daily  - Give KCl PO x1 today  - Recommend IV feraheme 510 mg x1 once MRI complete   2) Patient assistance: - Prior authorization for Farxiga approved through 02/10/24 - copay of $4/month   3)  Education  - To be completed prior to discharge   Cherylin Mylar, PharmD PGY1 Pharmacy Resident 5/7/20248:50 AM

## 2023-02-12 ENCOUNTER — Other Ambulatory Visit (HOSPITAL_COMMUNITY): Payer: Self-pay

## 2023-02-12 DIAGNOSIS — I428 Other cardiomyopathies: Secondary | ICD-10-CM | POA: Diagnosis not present

## 2023-02-12 DIAGNOSIS — I5023 Acute on chronic systolic (congestive) heart failure: Secondary | ICD-10-CM | POA: Diagnosis not present

## 2023-02-12 DIAGNOSIS — I472 Ventricular tachycardia, unspecified: Secondary | ICD-10-CM | POA: Diagnosis not present

## 2023-02-12 LAB — GLUCOSE, CAPILLARY
Glucose-Capillary: 106 mg/dL — ABNORMAL HIGH (ref 70–99)
Glucose-Capillary: 107 mg/dL — ABNORMAL HIGH (ref 70–99)

## 2023-02-12 LAB — BASIC METABOLIC PANEL
Anion gap: 10 (ref 5–15)
BUN: 19 mg/dL (ref 6–20)
CO2: 22 mmol/L (ref 22–32)
Calcium: 9.5 mg/dL (ref 8.9–10.3)
Chloride: 101 mmol/L (ref 98–111)
Creatinine, Ser: 0.97 mg/dL (ref 0.44–1.00)
GFR, Estimated: >60 mL/min (ref >60)
Glucose, Bld: 108 mg/dL — ABNORMAL HIGH (ref 70–99)
Potassium: 4.1 mmol/L (ref 3.5–5.1)
Sodium: 133 mmol/L — ABNORMAL LOW (ref 135–145)

## 2023-02-12 MED ORDER — ASPIRIN 81 MG PO CHEW
81.0000 mg | CHEWABLE_TABLET | Freq: Every day | ORAL | 0 refills | Status: DC
  Filled 2023-02-12: qty 30, 30d supply, fill #0

## 2023-02-12 MED ORDER — DAPAGLIFLOZIN PROPANEDIOL 10 MG PO TABS
10.0000 mg | ORAL_TABLET | Freq: Every day | ORAL | 0 refills | Status: DC
  Filled 2023-02-12: qty 30, 30d supply, fill #0

## 2023-02-12 MED ORDER — METOPROLOL SUCCINATE ER 50 MG PO TB24: 50.0000 mg | ORAL_TABLET | Freq: Every day | ORAL | Status: DC

## 2023-02-12 MED ORDER — SACUBITRIL-VALSARTAN 24-26 MG PO TABS
1.0000 | ORAL_TABLET | Freq: Two times a day (BID) | ORAL | 0 refills | Status: DC
  Filled 2023-02-12: qty 60, 30d supply, fill #0

## 2023-02-12 MED ORDER — SPIRONOLACTONE 25 MG PO TABS
25.0000 mg | ORAL_TABLET | Freq: Every day | ORAL | 0 refills | Status: DC
  Filled 2023-02-12: qty 30, 30d supply, fill #0

## 2023-02-12 MED ORDER — TORSEMIDE 20 MG PO TABS
40.0000 mg | ORAL_TABLET | Freq: Every day | ORAL | 0 refills | Status: DC
  Filled 2023-02-12: qty 60, 30d supply, fill #0

## 2023-02-12 MED ORDER — METOPROLOL SUCCINATE ER 50 MG PO TB24
50.0000 mg | ORAL_TABLET | Freq: Every day | ORAL | 0 refills | Status: DC
  Filled 2023-02-12: qty 30, 30d supply, fill #0

## 2023-02-12 NOTE — Progress Notes (Addendum)
   Heart Failure Stewardship Pharmacist Progress Note   PCP: [facility] PCP-Cardiologist: None    HPI:  52 YO female with a PMH of anxiety/depression  Patient presented to the [facility] ED on 5/3 with chest pain and shortness of breath. She reports progressive dyspnea on exertion since March 2024 and attributes this to both oral and injectable steroid use. The patients notes lower extremity edema starting a few days prior to admission and chest pain the morning of admission. Initial BNP 1470 and CXR consistent with mild pulmonary edema. Echo from 5/3 showing LVEF <20%, global hypokinesis, mild MVR. Troponin peaked at 727 on 5/3 but patient reports resolution of chest pain with continued diuresis. R/LHC from 5/6 is nonischemic and showing PCWP 20 mmHg, LVEDP 21 mmHg, PAP 30 mmHg, CO/CI 4.81/2.26--indicating continued volume overload. Patient reports previous diagnosis of HTN treated with lisinopril x2 doses but this medication was discontinued due to a negative reaction described as hives. It is thought that continued uncontrolled HTN could be the etiology for her heart failure.   Current HF Medications: Diuretic: torsemide 40 mg daily Beta Blocker: metoprolol succinate 25 mg daily ACE/ARB/ARNI: Entresto 24-26 mg daily MRA: spironolactone 25 mg daily SGLT2i: dapagliflozin 10 mg daily  Prior to admission HF Medications: None  Pertinent Lab Values: Serum creatinine 0.97, BUN 19, Potassium 4.1, Sodium 133, BNP 1470, Magnesium 2.2, A1c 6.3%, iron 38, saturation 9%, ferritin 93  Vital Signs: Weight: 245 lbs (admission weight: 257 lbs) Blood pressure: 100s/70s  Heart rate: 100s  I/O: + yesterday; net -6.5L - not sure if this is accurate given weight change (standing) and torsemide addition yesterday  Medication Assistance / Insurance Benefits Check: Does the patient have prescription insurance?  Yes Type of insurance plan: Medicaid  Outpatient Pharmacy:   [facility] to [facility] Is the patient willing to use [facility] TOC pharmacy at discharge? Yes Is the patient willing to transition their outpatient pharmacy to utilize a [facility] outpatient pharmacy?   No   Assessment: 1. Acute systolic CHF (LVEF <20%), due to ICM. NYHA class III symptoms. - Scr increased 0.83>>0.97 from yesterday, on torsemide 40 mg daily. Strict I/Os and daily weights. Keep K>4 now that VT more controlled with medical management and Mg>2. - Recommend decreasing torsemide to 20 mg daily at discharge given increase in Scr from yesterday  - Continue spironolactone 25 mg daily  - Agree with increasing metoprolol succinate to 50 mg daily for continued VT control and given elevated HR - Continue Entresto 24-26 mg daily - Continue Farxiga 10 mg daily - Patient reports intolerance to iron--will plan for nutritional supplementation outpatient   Plan: 1) Medication changes recommended at this time: - Decrease torsemide to 20 mg at discharge  - Increase metoprolol succinate to 50 mg daily starting 5/9 - Start iron nutritional supplementation outpatient   2) Patient assistance: - Prior authorization for Farxiga approved through 02/10/24 - copay of $4/month   3)  Education  - Patient has been educated on current HF medications and potential additions to HF medication regimen - Patient verbalizes understanding that over the next few months, these medication doses may change and more medications may be added to optimize HF regimen - Patient has been educated on basic disease state pathophysiology and goals of therapy    Cherylin Mylar, PharmD PGY1 Pharmacy Resident 5/8/20249:14 AM

## 2023-02-12 NOTE — TOC Transition Note (Signed)
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 She states her niece will transport her home today. She has no other needs.          Patient Goals and CMS Choice      Discharge Placement                         Discharge Plan and Services Additional resources added to the After Visit Summary for                                       Social Determinants of Health (SDOH) Interventions SDOH Screenings   Food Insecurity: No Food Insecurity (02/08/2023)  Housing: Low Risk  (02/11/2023)  Transportation Needs: No Transportation Needs (02/11/2023)  Utilities: Not At Risk (02/08/2023)  Alcohol Screen: Low Risk  (02/11/2023)  Financial Resource Strain: Low Risk  (02/11/2023)  Tobacco Use: Medium Risk (02/11/2023)     Readmission Risk Interventions     No data to display

## 2023-02-12 NOTE — TOC Initial Note (Signed)
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  She states her niece will transport her home today. She has no other needs.        Patient Goals and CMS Choice            Expected Discharge Plan and Services         Expected Discharge Date: 02/12/23                                    Prior Living Arrangements/Services                       Activities of Daily Living Home Assistive Devices/Equipment: None ADL Screening (condition at time of admission) Patient's cognitive ability adequate to safely complete daily activities?: Yes Is the patient deaf or have difficulty hearing?: No Does the patient have difficulty seeing, even when wearing glasses/contacts?: No Does the patient have difficulty concentrating, remembering, or making decisions?: No Patient able to express need for assistance with ADLs?: Yes Does the patient have difficulty dressing or bathing?: No Independently performs ADLs?: Yes (appropriate for developmental age) Does the patient have difficulty walking or climbing stairs?: No Weakness of Legs: None Weakness of Arms/Hands: None  Permission Sought/Granted                  Emotional Assessment              Admission diagnosis:  SOB (shortness of breath) [R06.02] Hypervolemia, unspecified hypervolemia type [E87.70] New onset of congestive heart failure (HCC) [I50.9] Heart failure (HCC) [I50.9] Patient Active Problem List   Diagnosis Date Noted   Iron deficiency anemia 02/09/2023   Heart failure (HCC)  02/08/2023   Hypokalemia 02/08/2023   Acute on chronic systolic CHF (congestive heart failure) (HCC) 02/07/2023   Essential hypertension 02/07/2023   Diabetes mellitus type 2, diet-controlled (HCC) 02/07/2023   Elevated troponin 02/07/2023   Obesity, Class III, BMI 40-49.9 (morbid obesity) (HCC) 02/07/2023   PCP:  [facility] Pharmacy:   [facility] [phone] - [place], [place] - [address] [place] [address] Phone: [phone] Fax: [phone]  [facility] [address] [place] [address] Phone: [phone] Fax: [phone]     Social Determinants of Health (SDOH) Social History: SDOH Screenings   Food Insecurity: No Food Insecurity (02/08/2023)  Housing: Low Risk  (02/11/2023)  Transportation Needs: No Transportation Needs (02/11/2023)  Utilities: Not At Risk (02/08/2023)  Alcohol Screen: Low Risk  (02/11/2023)  Financial Resource Strain: Low Risk  (02/11/2023)  Tobacco Use: Medium Risk (02/11/2023)   SDOH Interventions: Housing Interventions: Intervention Not Indicated Transportation Interventions: Intervention Not Indicated Alcohol Usage Interventions: Intervention Not Indicated (Score <7) Financial Strain Interventions: Intervention Not Indicated   Readmission Risk Interventions  No data to display

## 2023-02-12 NOTE — Progress Notes (Signed)
Rounding Note   Patient Name: Stephanie Reilly Date of Encounter: 02/12/2023  [facility] Cardiologist: Rollene Rotunda, MD [facility] Electrophysiologist: Will Jorja Loa, MD   Subjective   No issues overnight- appreciate EP recommendations. No Lifevest or AICD at this time. Plan uptitrate BB. Had cMRI yesterday. This showed LVEF 12% with RVEF 31% and LGE in the mid-inferior wall. Could represent sarcoidosis - since coronaries were normal. Scar seems unlikely. Recommendation for outpatient FDG-PET.  Inpatient Medications    Scheduled Meds:  aspirin  81 mg Oral Daily   dapagliflozin propanediol  10 mg Oral Daily   docusate sodium  100 mg Oral BID   enoxaparin (LOVENOX) injection  40 mg Subcutaneous Q24H   insulin aspart  0-15 Units Subcutaneous TID WC   insulin aspart  0-5 Units Subcutaneous QHS   loratadine  10 mg Oral Daily   [START ON 02/13/2023] metoprolol succinate  50 mg Oral Daily   multivitamin with minerals  1 tablet Oral Daily   pneumococcal 20-valent conjugate vaccine  0.5 mL Intramuscular Tomorrow-1000   sacubitril-valsartan  1 tablet Oral BID   sodium chloride flush  3 mL Intravenous Q12H   sodium chloride flush  3 mL Intravenous Q12H   sodium chloride flush  3 mL Intravenous Q12H   spironolactone  25 mg Oral Daily   torsemide  40 mg Oral Daily   Continuous Infusions:  sodium chloride     PRN Meds: sodium chloride, bisacodyl, ondansetron **OR** ondansetron (ZOFRAN) IV, polyethylene glycol, sodium chloride flush, traZODone   Vital Signs    Vitals:   02/11/23 2351 02/12/23 0531 02/12/23 0533 02/12/23 0839  BP: 104/83 102/73  111/70  Pulse: 100 (!) 103  (!) 111  Resp: 17 18  18   Temp: 98.2 F (36.8 C) 98.8 F (37.1 C)  98.6 F (37 C)  TempSrc: Oral Oral    SpO2: 92% 95%  96%  Weight:   111.4 kg   Height:        Intake/Output Summary (Last 24 hours) at 02/12/2023 1013 Last data filed at 02/12/2023 4098 Gross per 24 hour  Intake 600  ml  Output --  Net 600 ml      02/12/2023    5:33 AM 02/11/2023    4:23 AM 02/10/2023    4:50 AM  Last 3 Weights  Weight (lbs) 245 lb 11.2 oz 249 lb 1.6 oz 249 lb 14.4 oz  Weight (kg) 111.449 kg 112.991 kg 113.354 kg      Telemetry    Sinus tachycardia - Personally Reviewed  ECG   N/A  Physical Exam   General appearance: alert, no distress, and morbidly obese Neck: no carotid bruit, no JVD, and thyroid not enlarged, symmetric, no tenderness/mass/nodules Lungs: diminished breath sounds bibasilar Heart: regular tachycardia Abdomen: soft, non-tender; bowel sounds normal; no masses,  no organomegaly Extremities: extremities normal, atraumatic, no cyanosis or edema Pulses: 2+ and symmetric Skin: Skin color, texture, turgor normal. No rashes or lesions Neurologic: Grossly normal Psych: Pleasant   Labs    High Sensitivity Troponin:   Recent Labs  Lab 02/07/23 0947 02/07/23 1157 02/07/23 1441 02/07/23 1738 02/07/23 1825  TROPONINIHS 29* 107* 292* 457* 727*     Chemistry Recent Labs  Lab 02/07/23 0947 02/08/23 0030 02/09/23 0323 02/10/23 0104 02/10/23 0259 02/10/23 1442 02/10/23 1451 02/11/23 0103 02/12/23 0040  NA 140   < > 138 138  --    < > 140 136 133*  K 3.1*   < >  3.2* 4.1  --    < > 4.3 3.8 4.1  CL 108   < > 102 105  --   --   --  104 101  CO2 23   < > 24 22  --   --   --  21* 22  GLUCOSE 139*   < > 115* 112*  --   --   --  98 108*  BUN 15   < > 13 13  --   --   --  14 19  CREATININE 0.89   < > 0.95 0.89  --   --   --  0.83 0.97  CALCIUM 9.0   < > 9.3 9.3  --   --   --  9.5 9.5  MG  --    < > 2.2  --  2.2  --   --  2.2  --   PROT 7.3  --   --   --   --   --   --   --   --   ALBUMIN 3.5  --   --   --   --   --   --   --   --   AST 33  --   --   --   --   --   --   --   --   ALT 39  --   --   --   --   --   --   --   --   ALKPHOS 70  --   --   --   --   --   --   --   --   BILITOT 0.9  --   --   --   --   --   --   --   --   GFRNONAA >60   < > >60 >60   --   --   --  >60 >60  ANIONGAP 9   < > 12 11  --   --   --  11 10   < > = values in this interval not displayed.    Lipids No results for input(s): "CHOL", "TRIG", "HDL", "LABVLDL", "LDLCALC", "CHOLHDL" in the last 168 hours.  Hematology Recent Labs  Lab 02/09/23 0323 02/10/23 0104 02/10/23 1442 02/10/23 1450 02/10/23 1451 02/11/23 0103  WBC 10.3 10.4  --   --   --  8.3  RBC 5.04 5.09  --   --   --  5.19*  HGB 13.5 13.5   < > 15.0 15.3* 13.7  HCT 42.3 43.1   < > 44.0 45.0 43.4  MCV 83.9 84.7  --   --   --  83.6  MCH 26.8 26.5  --   --   --  26.4  MCHC 31.9 31.3  --   --   --  31.6  RDW 16.0* 16.1*  --   --   --  16.1*  PLT 282 291  --   --   --  269   < > = values in this interval not displayed.   Thyroid  Recent Labs  Lab 02/07/23 1738  TSH 1.459    BNP Recent Labs  Lab 02/07/23 0947  BNP 1,470.5*    DDimer No results for input(s): "DDIMER" in the last 168 hours.   Radiology    MR CARDIAC MORPHOLOGY W WO CONTRAST  Result Date: 02/11/2023 CLINICAL DATA:  52F p/w acute  heart failure. Echo shows EF <20%, mild RV dysfunction. LHC shows normal coronary arteries EXAM: CARDIAC MRI TECHNIQUE: The patient was scanned on a 1.5 Tesla Siemens magnet. A dedicated cardiac coil was used. Functional imaging was done using Fiesta sequences. 2,3, and 4 chamber views were done to assess for RWMA's. Modified Simpson's rule using a short axis stack was used to calculate an ejection fraction on a dedicated work [employer]. The patient received 10 cc of Gadavist. After 10 minutes inversion recovery sequences were used to assess for infiltration and scar tissue. Phase contrast velocity mapping was performed above the aortic and pulmonic valves CONTRAST:  10 cc  of Gadavist FINDINGS: Left ventricle: -Normal wall thickness -Severe dilatation -Severe systolic dysfunction -Normal ECV (28%) -RV insertion site LGE -Subendocardial LGE in focal region of mid inferior wall LV EF:  12%  (Normal 52-79%) Absolute volumes: LV EDV: (Normal 78-167 mL) LV ESV: (Normal 21-64 mL) LV SV: 39mL (Normal 52-114 mL) CO: 4.4L/min (Normal 2.7-6.3 L/min) Indexed volumes: LV EDV: 15mL/sq-m (Normal 50-96 mL/sq-m) LV ESV: 135mL/sq-m (Normal 10-40 mL/sq-m) LV SV: 48mL/sq-m (Normal 33-64 mL/sq-m) CI: 2.0L/min/sq-m (Normal 1.9-3.9 L/min/sq-m) Right ventricle: Normal size with moderate systolic dysfunction RV EF: 31% (Normal 52-80%) Absolute volumes: RV EDV: (Normal 79-175 mL) RV ESV: 91mL (Normal 13-75 mL) RV SV: 40mL (Normal 56-110 mL) CO: 4.5L/min (Normal 2.7-6 L/min) Indexed volumes: RV EDV: 5mL/sq-m (Normal 51-97 mL/sq-m) RV ESV: 22mL/sq-m (Normal 9-42 mL/sq-m) RV SV: 91mL/sq-m (Normal 35-61 mL/sq-m) CI: 2.0L/min/sq-m (Normal 1.8-3.8 L/min/sq-m) Left atrium: Mild enlargement Right atrium: Normal size Mitral valve: Mild regurgitation Aortic valve: Trivial regurgitation Tricuspid valve: Mild regurgitation Pulmonic valve: Trivial regurgitation Aorta: Normal proximal ascending aorta Pericardium: Small effusion IMPRESSION: 1. Severe LV dilatation, normal wall thickness, and severe systolic dysfunction (EF 12%) 2.  Normal RV size with moderate systolic dysfunction (EF 31%) 3. Subendocardial LGE in focal region of mid inferior wall. Subendocardial LGE is typically ischemic in etiology and this could represent a small infarct. However, sarcoidosis can also cause subendocardial LGE. Recommend cardiac FDG PET to evaluate for sarcoid. 4. RV insertion site LGE, which is a nonspecific scar pattern often seen in setting of elevated pulmonary pressures Electronically Signed   By: Epifanio Lesches M.D.   On: 02/11/2023 22:21   MR CARDIAC VELOCITY FLOW MAP  Result Date: 02/11/2023 CLINICAL DATA:  59F p/w acute heart failure. Echo shows EF <20%, mild RV dysfunction. LHC shows normal coronary arteries EXAM: CARDIAC MRI TECHNIQUE: The patient was scanned on a 1.5 Tesla Siemens magnet. A dedicated cardiac coil  was used. Functional imaging was done using Fiesta sequences. 2,3, and 4 chamber views were done to assess for RWMA's. Modified Simpson's rule using a short axis stack was used to calculate an ejection fraction on a dedicated work [employer], [employer]. The patient received 10 cc of Gadavist. After 10 minutes inversion recovery sequences were used to assess for infiltration and scar tissue. Phase contrast velocity mapping was performed above the aortic and pulmonic valves CONTRAST:  10 cc  of Gadavist FINDINGS: Left ventricle: -Normal wall thickness -Severe dilatation -Severe systolic dysfunction -Normal ECV (28%) -RV insertion site LGE -Subendocardial LGE in focal region of mid inferior wall LV EF:  12% (Normal 52-79%) Absolute volumes: LV EDV: (Normal 78-167 mL) LV ESV: (Normal 21-64 mL) LV SV: 39mL (Normal 52-114 mL) CO: 4.4L/min (Normal 2.7-6.3 L/min) Indexed volumes: LV EDV: 150mL/sq-m (Normal 50-96 mL/sq-m) LV ESV: 139mL/sq-m (Normal 10-40 mL/sq-m) LV SV:  24mL/sq-m (Normal 33-64 mL/sq-m) CI: 2.0L/min/sq-m (Normal 1.9-3.9 L/min/sq-m) Right ventricle: Normal size with moderate systolic dysfunction RV EF: 31% (Normal 52-80%) Absolute volumes: RV EDV: (Normal 79-175 mL) RV ESV: 91mL (Normal 13-75 mL) RV SV: 40mL (Normal 56-110 mL) CO: 4.5L/min (Normal 2.7-6 L/min) Indexed volumes: RV EDV: 39mL/sq-m (Normal 51-97 mL/sq-m) RV ESV: 46mL/sq-m (Normal 9-42 mL/sq-m) RV SV: 17mL/sq-m (Normal 35-61 mL/sq-m) CI: 2.0L/min/sq-m (Normal 1.8-3.8 L/min/sq-m) Left atrium: Mild enlargement Right atrium: Normal size Mitral valve: Mild regurgitation Aortic valve: Trivial regurgitation Tricuspid valve: Mild regurgitation Pulmonic valve: Trivial regurgitation Aorta: Normal proximal ascending aorta Pericardium: Small effusion IMPRESSION: 1. Severe LV dilatation, normal wall thickness, and severe systolic dysfunction (EF 12%) 2.  Normal RV size with moderate systolic dysfunction (EF 31%) 3. Subendocardial LGE  in focal region of mid inferior wall. Subendocardial LGE is typically ischemic in etiology and this could represent a small infarct. However, sarcoidosis can also cause subendocardial LGE. Recommend cardiac FDG PET to evaluate for sarcoid. 4. RV insertion site LGE, which is a nonspecific scar pattern often seen in setting of elevated pulmonary pressures Electronically Signed   By: Epifanio Lesches M.D.   On: 02/11/2023 22:21   MR CARDIAC VELOCITY FLOW MAP  Result Date: 02/11/2023 CLINICAL DATA:  65F p/w acute heart failure. Echo shows EF <20%, mild RV dysfunction. LHC shows normal coronary arteries EXAM: CARDIAC MRI TECHNIQUE: The patient was scanned on a 1.5 Tesla Siemens magnet. A dedicated cardiac coil was used. Functional imaging was done using Fiesta sequences. 2,3, and 4 chamber views were done to assess for RWMA's. Modified Simpson's rule using a short axis stack was used to calculate an ejection fraction on a dedicated work [employer] officer, [employer]. The patient received 10 cc of Gadavist. After 10 minutes inversion recovery sequences were used to assess for infiltration and scar tissue. Phase contrast velocity mapping was performed above the aortic and pulmonic valves CONTRAST:  10 cc  of Gadavist FINDINGS: Left ventricle: -Normal wall thickness -Severe dilatation -Severe systolic dysfunction -Normal ECV (28%) -RV insertion site LGE -Subendocardial LGE in focal region of mid inferior wall LV EF:  12% (Normal 52-79%) Absolute volumes: LV EDV: (Normal 78-167 mL) LV ESV: (Normal 21-64 mL) LV SV: 39mL (Normal 52-114 mL) CO: 4.4L/min (Normal 2.7-6.3 L/min) Indexed volumes: LV EDV: 169mL/sq-m (Normal 50-96 mL/sq-m) LV ESV: 143mL/sq-m (Normal 10-40 mL/sq-m) LV SV: 67mL/sq-m (Normal 33-64 mL/sq-m) CI: 2.0L/min/sq-m (Normal 1.9-3.9 L/min/sq-m) Right ventricle: Normal size with moderate systolic dysfunction RV EF: 31% (Normal 52-80%) Absolute volumes: RV EDV: (Normal 79-175 mL) RV ESV:  91mL (Normal 13-75 mL) RV SV: 40mL (Normal 56-110 mL) CO: 4.5L/min (Normal 2.7-6 L/min) Indexed volumes: RV EDV: 69mL/sq-m (Normal 51-97 mL/sq-m) RV ESV: 12mL/sq-m (Normal 9-42 mL/sq-m) RV SV: 84mL/sq-m (Normal 35-61 mL/sq-m) CI: 2.0L/min/sq-m (Normal 1.8-3.8 L/min/sq-m) Left atrium: Mild enlargement Right atrium: Normal size Mitral valve: Mild regurgitation Aortic valve: Trivial regurgitation Tricuspid valve: Mild regurgitation Pulmonic valve: Trivial regurgitation Aorta: Normal proximal ascending aorta Pericardium: Small effusion IMPRESSION: 1. Severe LV dilatation, normal wall thickness, and severe systolic dysfunction (EF 12%) 2.  Normal RV size with moderate systolic dysfunction (EF 31%) 3. Subendocardial LGE in focal region of mid inferior wall. Subendocardial LGE is typically ischemic in etiology and this could represent a small infarct. However, sarcoidosis can also cause subendocardial LGE. Recommend cardiac FDG PET to evaluate for sarcoid. 4. RV insertion site LGE, which is a nonspecific scar pattern often seen in setting of elevated pulmonary pressures Electronically Signed  By: Epifanio Lesches M.D.   On: 02/11/2023 22:21   CARDIAC CATHETERIZATION  Result Date: 02/10/2023   LV end diastolic pressure is mildly elevated.   Hemodynamic findings consistent with mild pulmonary hypertension. Normal coronary anatomy Mild LV filling pressures. PCWP 23/21 with mean 20 mm Hg. LVEDP 21 mm Hg Mild pulmonary HTN PAP 42/22 with mean 30 mm Hg Cardiac output 4.81 L/min with index 2.26. Plan: optimize CHF therapy    Cardiac Studies   Echocardiogram 02/07/2023:   1. Left ventricular ejection fraction, by estimation, is <20%. The left  ventricle has severely decreased function. The left ventricle demonstrates  global hypokinesis. The left ventricular internal cavity size was severely  dilated. Left ventricular  diastolic parameters are consistent with Grade III diastolic dysfunction  (restrictive). The  average left ventricular global longitudinal strain is  -4.8 %. The global longitudinal strain is abnormal.   2. Right ventricular systolic function is mildly reduced. The right  ventricular size is normal.   3. Left atrial size was severely dilated.   4. The mitral valve is normal in structure. Mild mitral valve  regurgitation. No evidence of mitral stenosis.   5. The aortic valve is tricuspid. Aortic valve regurgitation is not  visualized. No aortic stenosis is present.   6. The inferior vena cava is dilated in size with <50% respiratory  variability, suggesting right atrial pressure of 15 mmHg.   Patient Profile     52 y.o. female with acute systolic heart failure, EF less than 20% unknown etiology elevated troponin 700  Assessment & Plan    Acute systolic heart failure - EF less than 20 percent., non-ischemic by cath. CO 4.8L/min - 7L negative, however, remains volume up based on RHC.  Continue to optimize goal-directed medical therapy which includes Entresto Farxiga spironolactone and Toprol.  - Start torsemide 40 mg daily po.  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 - cMRI suggests possible mid inferior wall LGE - may be d/t sarcoid. An outpatient FDG-PET is recommended.  Non-ST elevation myocardial infarction -Troponin up to 700, however, cath showed no CAD -heparin switched to prophylaxis  Uncontrolled hypertension - BP improved  Hypokalemia - Potassium 4.1 today  Sustained VT - Had sustained 40 beat run of MMVT and shorter NSVT runs as well. Asymptomatic. No CAD.   -will further uptitrate BB to 50 mg XL daily  Could be discharged from a cardiac standpoint today. Has HF impact clinic follow-up on 5/24.   For questions or updates, please contact [facility] Please consult [email or web address] for contact info under   Chrystie Nose, MD, Milagros Loll  [facility]  [facility]  Medical Director of the  Advanced Lipid Disorders &  Cardiovascular Risk Reduction Clinic Diplomate of the American Board of Clinical Lipidology Attending Cardiologist  Direct Dial: [phone]  Fax: [phone]  Website:  [email or web address]  Chrystie Nose, MD  02/12/2023, 10:13 AM

## 2023-02-12 NOTE — Progress Notes (Addendum)
Chaplain responded to consult indicating pt is ready to notarize her ACP documents, but is ready for discharge and her ride is on the way. Chaplain spoke to patient on the phone to explain how to have documents notarized outside of the hospital and get them submitted to her chart. Chaplain explained that a notary could come to her this afternoon, but she declined waiting stating she would prefer to do them independently after discharge.  Please page as further needs arise.  Maryanna Shape. Carley Hammed, M.Div. [facility] Chaplain Pager [phone] Office [phone]   02/12/23 1327  Spiritual Encounters  Type of Visit Follow up  Advance Directives (For Healthcare)  Does Patient Have a Medical Advance Directive? No  Would patient like information on creating a medical advance directive? Yes (Inpatient - patient defers creating a medical advance directive at this time - Information given)

## 2023-02-12 NOTE — Discharge Summary (Signed)
Physician Discharge Summary  Stephanie Reilly WUJ:[id number] DOB: June 27, 1971 DOA: 02/07/2023  PCP: [facility]  Admit date: 02/07/2023 Discharge date: 02/12/2023  Time spent: 45 minutes  Recommendations for Outpatient Follow-up:  [facility] clinic on 5/24 Cardiology Dr. Antoine Poche in 1 month, needs cardiac PET scan   Discharge Diagnoses:  Principal Problem:   Acute on chronic systolic CHF (congestive heart failure) (HCC) Active Problems:   Essential hypertension   Diabetes mellitus type 2, diet-controlled (HCC)   Hypokalemia   Obesity, Class III, BMI 40-49.9 (morbid obesity) (HCC)   Iron deficiency anemia   Discharge Condition: Improved  Diet recommendation: Low-sodium heart healthy  Filed Weights   02/10/23 0450 02/11/23 0423 02/12/23 0533  Weight: 113.4 kg 113 kg 111.4 kg    History of present illness:  51/F w depression and anxiety, presented with dyspnea x4 weeks and edema. cr 0,89, BNP 1,470, troponin 29, 107, 292, 457. 727, CXR with cardiomegaly, with bilateral hilar vascular congestion.CT chest noted bilateral ground glass opacities, small right pleural effusion,   Hospital Course:   Acute on chronic systolic CHF -Echo w/ EF < 20%, global hypokinesis, normal RV, LA with severe dilatation,  -RHC.LHC with mildly elevated filling pressures, normal coronaries  -Cardiac MRI noted EF of 12%, RVEF 31%, LGE in the mid inferior wall, could represent sarcoidosis, scar seems unlikely, outpatient PET scan recommended -Cards following, diuresed well on IV Lasix, now changed to oral torsemide  -Also started on Entresto, Aldactone and Farxiga  -Plan for cardiac PET scan as outpatient, CHF clinic follow-up arranged   NSVT, PVCs -EP consulted, continue beta-blocker, dose increased, per EP continue GDMT for few months, if no improvement in EF then ICD can be considered   Essential hypertension Improved, meds as above   Diabetes mellitus type 2, diet-controlled  (HCC) -Stable, CBGs in the low 100s less than 120 this admission, continue Farxiga   Hypokalemia -Stable, monitor   Obesity, Class III, BMI 40-49.9 (morbid obesity) (HCC) Calculated BMI is 45.1    Iron deficiency anemia Iron panel consistent with iron deficiency and anemia of chronic disease.   -Reportedly intolerant to oral iron, will need increased nutritional iron    Discharge Exam: Vitals:   02/12/23 0839 02/12/23 1122  BP: 111/70 (!) 107/91  Pulse: (!) 111 (!) 116  Resp: 18 18  Temp: 98.6 F (37 C) 98.3 F (36.8 C)  SpO2: 96%     Gen: Awake, Alert, Oriented X 3,  HEENT: no JVD Lungs: Good air movement bilaterally, CTAB CVS: S1S2/RRR Abd: soft, Non tender, non distended, BS present Extremities: No edema Skin: no new rashes on exposed skin   Discharge Instructions   Discharge Instructions     Diet - low sodium heart healthy   Complete by: As directed    Increase activity slowly   Complete by: As directed       Allergies as of 02/12/2023       Reactions   Paroxetine Hcl Anaphylaxis   Prednisone Shortness Of Breath, Other (See Comments)   Confusion, "messes with mental," muscles feel weird, fatigue, insomnia   Chocolate    Ibuprofen    Iron Other (See Comments)   GI upset (reported 08/04/2017). Pt clarifies had constipation, nausea.  Stool softeners did not help.    Latex    Lisinopril Hives   Naproxen    Tramadol Hives   Tylenol [acetaminophen] Other (See Comments)   With Codeine; GI    Penicillins Rash  Serotonin Nausea Only, Swelling, Anxiety, Rash, Other (See Comments)   GI intolerence        Medication List     TAKE these medications    ascorbic acid 100 MG tablet Commonly known as: VITAMIN C Take 100 mg by mouth daily.   aspirin 81 MG chewable tablet Chew 1 tablet (81 mg total) by mouth daily. Start taking on: Feb 13, 2023   Biotin Maximum Strength 10 MG Tabs Generic drug: Biotin Take 10 mg by mouth daily.   cetirizine 10 MG  tablet Commonly known as: ZYRTEC Take 10 mg by mouth daily.   dapagliflozin propanediol 10 MG Tabs tablet Commonly known as: FARXIGA Take 1 tablet (10 mg total) by mouth daily. Start taking on: Feb 13, 2023   metoprolol succinate 50 MG 24 hr tablet Commonly known as: TOPROL-XL Take 1 tablet (50 mg total) by mouth daily. Take with or immediately following a meal. Start taking on: Feb 13, 2023   sacubitril-valsartan 24-26 MG Commonly known as: ENTRESTO Take 1 tablet by mouth 2 (two) times daily.   spironolactone 25 MG tablet Commonly known as: ALDACTONE Take 1 tablet (25 mg total) by mouth daily. Start taking on: Feb 13, 2023   Torsemide 40 MG Tabs Take 40 mg by mouth daily. Start taking on: Feb 13, 2023   Vitamin E 45 MG (100 UNIT) Caps Take 100 Units by mouth daily.       Allergies  Allergen Reactions   Paroxetine Hcl Anaphylaxis   Prednisone Shortness Of Breath and Other (See Comments)    Confusion, "messes with mental," muscles feel weird, fatigue, insomnia   Chocolate    Ibuprofen    Iron Other (See Comments)    GI upset (reported 08/04/2017). Pt clarifies had constipation, nausea.  Stool softeners did not help.    Latex    Lisinopril Hives   Naproxen    Tramadol Hives   Tylenol [Acetaminophen] Other (See Comments)    With Codeine; GI    Penicillins Rash   Serotonin Nausea Only, Swelling, Anxiety, Rash and Other (See Comments)    GI intolerence    Follow-up Information     [facility]. Go in 17 day(s).   Specialty: Cardiology Why: Hospital follow up 02/28/2023 @ 2 pm PLEASE bring a current medication list tyo appointment FREE valet parking, Entrance C, off [employer] information: [address] [id number] m[place] [address] [phone]                 The results of significant diagnostics from this hospitalization (including imaging, microbiology, ancillary and  laboratory) are listed below for reference.    Significant Diagnostic Studies: MR CARDIAC MORPHOLOGY W WO CONTRAST  Result Date: 02/11/2023 CLINICAL DATA:  85F p/w acute heart failure. Echo shows EF <20%, mild RV dysfunction. LHC shows normal coronary arteries EXAM: CARDIAC MRI TECHNIQUE: The patient was scanned on a 1.5 Tesla Siemens magnet. A dedicated cardiac coil was used. Functional imaging was done using Fiesta sequences. 2,3, and 4 chamber views were done to assess for RWMA's. Modified Simpson's rule using a short axis stack was used to calculate an ejection fraction on a dedicated work [employer], [employer]. The patient received 10 cc of Gadavist. After 10 minutes inversion recovery sequences were used to assess for infiltration and scar tissue. Phase contrast velocity mapping was performed above the aortic and pulmonic valves CONTRAST:  10 cc  of Gadavist FINDINGS: Left  ventricle: -Normal wall thickness -Severe dilatation -Severe systolic dysfunction -Normal ECV (28%) -RV insertion site LGE -Subendocardial LGE in focal region of mid inferior wall LV EF:  12% (Normal 52-79%) Absolute volumes: LV EDV: (Normal 78-167 mL) LV ESV: (Normal 21-64 mL) LV SV: 39mL (Normal 52-114 mL) CO: 4.4L/min (Normal 2.7-6.3 L/min) Indexed volumes: LV EDV: 146mL/sq-m (Normal 50-96 mL/sq-m) LV ESV: 17mL/sq-m (Normal 10-40 mL/sq-m) LV SV: 50mL/sq-m (Normal 33-64 mL/sq-m) CI: 2.0L/min/sq-m (Normal 1.9-3.9 L/min/sq-m) Right ventricle: Normal size with moderate systolic dysfunction RV EF: 31% (Normal 52-80%) Absolute volumes: RV EDV: (Normal 79-175 mL) RV ESV: 91mL (Normal 13-75 mL) RV SV: 40mL (Normal 56-110 mL) CO: 4.5L/min (Normal 2.7-6 L/min) Indexed volumes: RV EDV: 37mL/sq-m (Normal 51-97 mL/sq-m) RV ESV: 73mL/sq-m (Normal 9-42 mL/sq-m) RV SV: 32mL/sq-m (Normal 35-61 mL/sq-m) CI: 2.0L/min/sq-m (Normal 1.8-3.8 L/min/sq-m) Left atrium: Mild enlargement Right atrium: Normal size Mitral valve: Mild  regurgitation Aortic valve: Trivial regurgitation Tricuspid valve: Mild regurgitation Pulmonic valve: Trivial regurgitation Aorta: Normal proximal ascending aorta Pericardium: Small effusion IMPRESSION: 1. Severe LV dilatation, normal wall thickness, and severe systolic dysfunction (EF 12%) 2.  Normal RV size with moderate systolic dysfunction (EF 31%) 3. Subendocardial LGE in focal region of mid inferior wall. Subendocardial LGE is typically ischemic in etiology and this could represent a small infarct. However, sarcoidosis can also cause subendocardial LGE. Recommend cardiac FDG PET to evaluate for sarcoid. 4. RV insertion site LGE, which is a nonspecific scar pattern often seen in setting of elevated pulmonary pressures Electronically Signed   By: Epifanio Lesches M.D.   On: 02/11/2023 22:21   MR CARDIAC VELOCITY FLOW MAP  Result Date: 02/11/2023 CLINICAL DATA:  4F p/w acute heart failure. Echo shows EF <20%, mild RV dysfunction. LHC shows normal coronary arteries EXAM: CARDIAC MRI TECHNIQUE: The patient was scanned on a 1.5 Tesla Siemens magnet. A dedicated cardiac coil was used. Functional imaging was done using Fiesta sequences. 2,3, and 4 chamber views were done to assess for RWMA's. Modified Simpson's rule using a short axis stack was used to calculate an ejection fraction on a dedicated work [employer] officer, [employer]. The patient received 10 cc of Gadavist. After 10 minutes inversion recovery sequences were used to assess for infiltration and scar tissue. Phase contrast velocity mapping was performed above the aortic and pulmonic valves CONTRAST:  10 cc  of Gadavist FINDINGS: Left ventricle: -Normal wall thickness -Severe dilatation -Severe systolic dysfunction -Normal ECV (28%) -RV insertion site LGE -Subendocardial LGE in focal region of mid inferior wall LV EF:  12% (Normal 52-79%) Absolute volumes: LV EDV: (Normal 78-167 mL) LV ESV: (Normal 21-64 mL) LV SV: 39mL (Normal 52-114  mL) CO: 4.4L/min (Normal 2.7-6.3 L/min) Indexed volumes: LV EDV: 126mL/sq-m (Normal 50-96 mL/sq-m) LV ESV: 136mL/sq-m (Normal 10-40 mL/sq-m) LV SV: 28mL/sq-m (Normal 33-64 mL/sq-m) CI: 2.0L/min/sq-m (Normal 1.9-3.9 L/min/sq-m) Right ventricle: Normal size with moderate systolic dysfunction RV EF: 31% (Normal 52-80%) Absolute volumes: RV EDV: (Normal 79-175 mL) RV ESV: 91mL (Normal 13-75 mL) RV SV: 40mL (Normal 56-110 mL) CO: 4.5L/min (Normal 2.7-6 L/min) Indexed volumes: RV EDV: 17mL/sq-m (Normal 51-97 mL/sq-m) RV ESV: 32mL/sq-m (Normal 9-42 mL/sq-m) RV SV: 80mL/sq-m (Normal 35-61 mL/sq-m) CI: 2.0L/min/sq-m (Normal 1.8-3.8 L/min/sq-m) Left atrium: Mild enlargement Right atrium: Normal size Mitral valve: Mild regurgitation Aortic valve: Trivial regurgitation Tricuspid valve: Mild regurgitation Pulmonic valve: Trivial regurgitation Aorta: Normal proximal ascending aorta Pericardium: Small effusion IMPRESSION: 1. Severe LV dilatation, normal wall thickness, and severe systolic dysfunction (EF 12%)  2.  Normal RV size with moderate systolic dysfunction (EF 31%) 3. Subendocardial LGE in focal region of mid inferior wall. Subendocardial LGE is typically ischemic in etiology and this could represent a small infarct. However, sarcoidosis can also cause subendocardial LGE. Recommend cardiac FDG PET to evaluate for sarcoid. 4. RV insertion site LGE, which is a nonspecific scar pattern often seen in setting of elevated pulmonary pressures Electronically Signed   By: Epifanio Lesches M.D.   On: 02/11/2023 22:21   MR CARDIAC VELOCITY FLOW MAP  Result Date: 02/11/2023 CLINICAL DATA:  34F p/w acute heart failure. Echo shows EF <20%, mild RV dysfunction. LHC shows normal coronary arteries EXAM: CARDIAC MRI TECHNIQUE: The patient was scanned on a 1.5 Tesla Siemens magnet. A dedicated cardiac coil was used. Functional imaging was done using Fiesta sequences. 2,3, and 4 chamber views were done to assess for RWMA's.  Modified Simpson's rule using a short axis stack was used to calculate an ejection fraction on a dedicated work [employer] officer, [employer]. The patient received 10 cc of Gadavist. After 10 minutes inversion recovery sequences were used to assess for infiltration and scar tissue. Phase contrast velocity mapping was performed above the aortic and pulmonic valves CONTRAST:  10 cc  of Gadavist FINDINGS: Left ventricle: -Normal wall thickness -Severe dilatation -Severe systolic dysfunction -Normal ECV (28%) -RV insertion site LGE -Subendocardial LGE in focal region of mid inferior wall LV EF:  12% (Normal 52-79%) Absolute volumes: LV EDV: (Normal 78-167 mL) LV ESV: (Normal 21-64 mL) LV SV: 39mL (Normal 52-114 mL) CO: 4.4L/min (Normal 2.7-6.3 L/min) Indexed volumes: LV EDV: 154mL/sq-m (Normal 50-96 mL/sq-m) LV ESV: 167mL/sq-m (Normal 10-40 mL/sq-m) LV SV: 74mL/sq-m (Normal 33-64 mL/sq-m) CI: 2.0L/min/sq-m (Normal 1.9-3.9 L/min/sq-m) Right ventricle: Normal size with moderate systolic dysfunction RV EF: 31% (Normal 52-80%) Absolute volumes: RV EDV: (Normal 79-175 mL) RV ESV: 91mL (Normal 13-75 mL) RV SV: 40mL (Normal 56-110 mL) CO: 4.5L/min (Normal 2.7-6 L/min) Indexed volumes: RV EDV: 18mL/sq-m (Normal 51-97 mL/sq-m) RV ESV: 74mL/sq-m (Normal 9-42 mL/sq-m) RV SV: 99mL/sq-m (Normal 35-61 mL/sq-m) CI: 2.0L/min/sq-m (Normal 1.8-3.8 L/min/sq-m) Left atrium: Mild enlargement Right atrium: Normal size Mitral valve: Mild regurgitation Aortic valve: Trivial regurgitation Tricuspid valve: Mild regurgitation Pulmonic valve: Trivial regurgitation Aorta: Normal proximal ascending aorta Pericardium: Small effusion IMPRESSION: 1. Severe LV dilatation, normal wall thickness, and severe systolic dysfunction (EF 12%) 2.  Normal RV size with moderate systolic dysfunction (EF 31%) 3. Subendocardial LGE in focal region of mid inferior wall. Subendocardial LGE is typically ischemic in etiology and this could represent a  small infarct. However, sarcoidosis can also cause subendocardial LGE. Recommend cardiac FDG PET to evaluate for sarcoid. 4. RV insertion site LGE, which is a nonspecific scar pattern often seen in setting of elevated pulmonary pressures Electronically Signed   By: Epifanio Lesches M.D.   On: 02/11/2023 22:21   CARDIAC CATHETERIZATION  Result Date: 02/10/2023   LV end diastolic pressure is mildly elevated.   Hemodynamic findings consistent with mild pulmonary hypertension. Normal coronary anatomy Mild LV filling pressures. PCWP 23/21 with mean 20 mm Hg. LVEDP 21 mm Hg Mild pulmonary HTN PAP 42/22 with mean 30 mm Hg Cardiac output 4.81 L/min with index 2.26. Plan: optimize CHF therapy   ECHOCARDIOGRAM COMPLETE  Result Date: 02/07/2023    ECHOCARDIOGRAM REPORT   Patient Name:   Stephanie Reilly Date of Exam: 02/07/2023 Medical Rec #:  [id number]     Height:       62.5  in Accession #:    [id number]    Weight:       257.7 lb Date of Birth:  1971-07-05     BSA:          2.142 m Patient Age:    51 years      BP:           141/113 mmHg Patient Gender: F             HR:           114 bpm. Exam Location:  Inpatient Procedure: 2D Echo, 3D Echo, Cardiac Doppler, Color Doppler and Strain Analysis Indications:    Congestive Heart Failure I50.9  History:        Patient has no prior history of Echocardiogram examinations.                 CHF; Risk Factors:Former Smoker, Hypertension and Diabetes.  Sonographer:    Dondra Prader RVT RCS Referring Phys: [id number] JENNIFER YATES IMPRESSIONS  1. Left ventricular ejection fraction, by estimation, is <20%. The left ventricle has severely decreased function. The left ventricle demonstrates global hypokinesis. The left ventricular internal cavity size was severely dilated. Left ventricular diastolic parameters are consistent with Grade III diastolic dysfunction (restrictive). The average left ventricular global longitudinal strain is -4.8 %. The global longitudinal strain is abnormal.  2.  Right ventricular systolic function is mildly reduced. The right ventricular size is normal.  3. Left atrial size was severely dilated.  4. The mitral valve is normal in structure. Mild mitral valve regurgitation. No evidence of mitral stenosis.  5. The aortic valve is tricuspid. Aortic valve regurgitation is not visualized. No aortic stenosis is present.  6. The inferior vena cava is dilated in size with <50% respiratory variability, suggesting right atrial pressure of 15 mmHg. Comparison(s): No prior Echocardiogram. FINDINGS  Left Ventricle: Left ventricular ejection fraction, by estimation, is <20%. The left ventricle has severely decreased function. The left ventricle demonstrates global hypokinesis. The average left ventricular global longitudinal strain is -4.8 %. The global longitudinal strain is abnormal. The left ventricular internal cavity size was severely dilated. There is no left ventricular hypertrophy. Left ventricular diastolic parameters are consistent with Grade III diastolic dysfunction (restrictive). Right Ventricle: The right ventricular size is normal. Right ventricular systolic function is mildly reduced. Left Atrium: Left atrial size was severely dilated. Right Atrium: Right atrial size was normal in size. Pericardium: Trivial pericardial effusion is present. Mitral Valve: The mitral valve is normal in structure. Mild mitral valve regurgitation. No evidence of mitral valve stenosis. Tricuspid Valve: The tricuspid valve is normal in structure. Tricuspid valve regurgitation is mild . No evidence of tricuspid stenosis. Aortic Valve: The aortic valve is tricuspid. Aortic valve regurgitation is not visualized. No aortic stenosis is present. Aortic valve mean gradient measures 2.0 mmHg. Aortic valve peak gradient measures 4.3 mmHg. Aortic valve area, by VTI measures 3.66 cm. Pulmonic Valve: The pulmonic valve was normal in structure. Pulmonic valve regurgitation is trivial. No evidence of pulmonic  stenosis. Aorta: The aortic root is normal in size and structure. Venous: The inferior vena cava is dilated in size with less than 50% respiratory variability, suggesting right atrial pressure of 15 mmHg. IAS/Shunts: There is right bowing of the interatrial septum, suggestive of elevated left atrial pressure. No atrial level shunt detected by color flow Doppler.  LEFT VENTRICLE PLAX 2D LVIDd:         6.40 cm LVIDs:  5.50 cm   2D Longitudinal Strain LV PW:         1.20 cm   2D Strain GLS Avg:     -4.8 % LV IVS:        1.00 cm LVOT diam:     2.00 cm LV SV:         51 LV SV Index:   24        3D Volume EF: LVOT Area:     3.14 cm  3D EF:        29 %                          LV EDV:       266 ml                          LV ESV:       188 ml                          LV SV:        78 ml RIGHT VENTRICLE             IVC RV Basal diam:  3.30 cm     IVC diam: 2.50 cm RV Mid diam:    2.50 cm RV S prime:     11.60 cm/s TAPSE (M-mode): 2.1 cm LEFT ATRIUM              Index        RIGHT ATRIUM           Index LA diam:        5.40 cm  2.52 cm/m   RA Area:     21.30 cm LA Vol (A2C):   126.0 ml 58.83 ml/m  RA Volume:   63.90 ml  29.83 ml/m LA Vol (A4C):   74.8 ml  34.92 ml/m LA Biplane Vol: 98.6 ml  46.03 ml/m  AORTIC VALVE                    PULMONIC VALVE AV Area (Vmax):    3.38 cm     PV Vmax:       0.75 m/s AV Area (Vmean):   3.07 cm     PV Peak grad:  2.3 mmHg AV Area (VTI):     3.66 cm AV Vmax:           104.00 cm/s AV Vmean:          71.400 cm/s AV VTI:            0.140 m AV Peak Grad:      4.3 mmHg AV Mean Grad:      2.0 mmHg LVOT Vmax:         112.00 cm/s LVOT Vmean:        69.800 cm/s LVOT VTI:          0.163 m LVOT/AV VTI ratio: 1.16  AORTA Ao Root diam: 3.10 cm Ao Asc diam:  3.50 cm Ao Arch diam: 2.8 cm MITRAL VALVE                TRICUSPID VALVE MV Area (PHT): 4.89 cm     TR Peak grad:   29.8 mmHg MV Decel Time: 155 msec     TR Vmax:        273.00 cm/s MR Peak grad:  85.7 mmHg MR Mean grad: 58.0 mmHg      SHUNTS MR Vmax:      463.00 cm/s   Systemic VTI:  0.16 m MR Vmean:     355.0 cm/s    Systemic Diam: 2.00 cm MV E velocity: 120.00 cm/s Olga Millers MD Electronically signed by Olga Millers MD Signature Date/Time: 02/07/2023/5:19:03 PM    Final    CT Angio Chest PE W and/or Wo Contrast  Result Date: 02/07/2023 CLINICAL DATA:  Chest pain. EXAM: CT ANGIOGRAPHY CHEST WITH CONTRAST TECHNIQUE: Multidetector CT imaging of the chest was performed using the standard protocol during bolus administration of intravenous contrast. Multiplanar CT image reconstructions and MIPs were obtained to evaluate the vascular anatomy. RADIATION DOSE REDUCTION: This exam was performed according to the departmental dose-optimization program which includes automated exposure control, adjustment of the mA and/or kV according to patient size and/or use of iterative reconstruction technique. CONTRAST:  OMNIPAQUE IOHEXOL 350 MG/ML SOLN COMPARISON:  None Available. FINDINGS: Cardiovascular: Satisfactory opacification of the pulmonary arteries to the segmental level. No evidence of pulmonary embolism. Moderate cardiomegaly is noted. No pericardial effusion. Mediastinum/Nodes: No enlarged mediastinal, hilar, or axillary lymph nodes. Thyroid gland, trachea, and esophagus demonstrate no significant findings. Lungs/Pleura: No pneumothorax is noted. Small right pleural effusion is noted with minimal adjacent subsegmental atelectasis. Minimal left basilar subsegmental atelectasis is noted. Upper Abdomen: No acute abnormality. Musculoskeletal: No chest wall abnormality. No acute or significant osseous findings. Review of the MIP images confirms the above findings. IMPRESSION: No definite evidence of pulmonary embolus. Moderate cardiomegaly. Small right pleural effusion. Minimal bibasilar subsegmental atelectasis. Electronically Signed   By: Lupita Raider M.D.   On: 02/07/2023 11:30   DG Chest Portable 1 View  Result Date:  02/07/2023 CLINICAL DATA:  Chest pain EXAM: PORTABLE CHEST 1 VIEW COMPARISON:  X-ray 07/29/2022 FINDINGS: Enlarged cardiopericardial silhouette with slight central vascular congestion. No pneumothorax, effusion or consolidation. Film is under penetrated. IMPRESSION: Under penetrated radiograph. Enlarged heart with central vascular congestion Electronically Signed   By: Karen Kays M.D.   On: 02/07/2023 09:47    Microbiology: No results found for this or any previous visit (from the past 240 hour(s)).   Labs: Basic Metabolic Panel: Recent Labs  Lab 02/08/23 1032 02/09/23 0323 02/10/23 0104 02/10/23 0259 02/10/23 1442 02/10/23 1450 02/10/23 1451 02/11/23 0103 02/12/23 0040  NA 140 138 138  --  140 140 140 136 133*  K 3.3* 3.2* 4.1  --  4.3 4.3 4.3 3.8 4.1  CL 101 102 105  --   --   --   --  104 101  CO2 27 24 22   --   --   --   --  21* 22  GLUCOSE 114* 115* 112*  --   --   --   --  98 108*  BUN 8 13 13   --   --   --   --  14 19  CREATININE 0.89 0.95 0.89  --   --   --   --  0.83 0.97  CALCIUM 9.5 9.3 9.3  --   --   --   --  9.5 9.5  MG 2.2 2.2  --  2.2  --   --   --  2.2  --    Liver Function Tests: Recent Labs  Lab 02/07/23 0947  AST 33  ALT 39  ALKPHOS 70  BILITOT 0.9  PROT 7.3  ALBUMIN 3.5   No results  for input(s): "LIPASE", "AMYLASE" in the last 168 hours. No results for input(s): "AMMONIA" in the last 168 hours. CBC: Recent Labs  Lab 02/07/23 0947 02/08/23 0030 02/09/23 0323 02/10/23 0104 02/10/23 1442 02/10/23 1450 02/10/23 1451 02/11/23 0103  WBC 10.6* 10.5 10.3 10.4  --   --   --  8.3  NEUTROABS 7.9*  --   --   --   --   --   --   --   HGB 12.1 12.6 13.5 13.5 15.0 15.0 15.3* 13.7  HCT 37.8 39.6 42.3 43.1 44.0 44.0 45.0 43.4  MCV 84.0 84.4 83.9 84.7  --   --   --  83.6  PLT 293 258 282 291  --   --   --  269   Cardiac Enzymes: No results for input(s): "CKTOTAL", "CKMB", "CKMBINDEX", "TROPONINI" in the last 168 hours. BNP: BNP (last 3  results) Recent Labs    02/07/23 0947  BNP 1,470.5*    ProBNP (last 3 results) No results for input(s): "PROBNP" in the last 8760 hours.  CBG: Recent Labs  Lab 02/11/23 1130 02/11/23 1615 02/11/23 2057 02/12/23 0557 02/12/23 1121  GLUCAP 99 108* 118* 106* 107*       Signed:  Zannie Cove MD.  [facility] 02/12/2023, 11:55 AM

## 2023-02-13 LAB — LIPOPROTEIN A (LPA): Lipoprotein (a): 138.5 nmol/L — ABNORMAL HIGH (ref <75.0)

## 2023-02-15 ENCOUNTER — Inpatient Hospital Stay (HOSPITAL_COMMUNITY): Payer: Medicaid Other | Admitting: Anesthesiology

## 2023-02-15 ENCOUNTER — Inpatient Hospital Stay (HOSPITAL_COMMUNITY): Payer: Medicaid Other

## 2023-02-15 ENCOUNTER — Encounter (HOSPITAL_COMMUNITY)
Admission: EM | Disposition: A | Discharge status: Home / Self Care | Payer: Self-pay | Source: Home / Self Care | Attending: Neurology

## 2023-02-15 ENCOUNTER — Emergency Department (HOSPITAL_COMMUNITY): Payer: Medicaid Other

## 2023-02-15 ENCOUNTER — Telehealth: Payer: Self-pay | Admitting: Internal Medicine

## 2023-02-15 ENCOUNTER — Inpatient Hospital Stay (HOSPITAL_COMMUNITY)
Admission: EM | Admit: 2023-02-15 | Discharge: 2023-02-20 | DRG: 023 | Disposition: A | Discharge status: Home / Self Care | Payer: Medicaid Other | Attending: Neurology | Admitting: Neurology

## 2023-02-15 DIAGNOSIS — E785 Hyperlipidemia, unspecified: Secondary | ICD-10-CM | POA: Diagnosis present

## 2023-02-15 DIAGNOSIS — I428 Other cardiomyopathies: Secondary | ICD-10-CM | POA: Diagnosis present

## 2023-02-15 DIAGNOSIS — I639 Cerebral infarction, unspecified: Secondary | ICD-10-CM | POA: Diagnosis present

## 2023-02-15 DIAGNOSIS — R55 Syncope and collapse: Secondary | ICD-10-CM | POA: Diagnosis not present

## 2023-02-15 DIAGNOSIS — I63511 Cerebral infarction due to unspecified occlusion or stenosis of right middle cerebral artery: Secondary | ICD-10-CM | POA: Diagnosis not present

## 2023-02-15 DIAGNOSIS — I509 Heart failure, unspecified: Secondary | ICD-10-CM

## 2023-02-15 DIAGNOSIS — Z79899 Other long term (current) drug therapy: Secondary | ICD-10-CM

## 2023-02-15 DIAGNOSIS — N170 Acute kidney failure with tubular necrosis: Secondary | ICD-10-CM | POA: Diagnosis present

## 2023-02-15 DIAGNOSIS — Z825 Family history of asthma and other chronic lower respiratory diseases: Secondary | ICD-10-CM

## 2023-02-15 DIAGNOSIS — Z8249 Family history of ischemic heart disease and other diseases of the circulatory system: Secondary | ICD-10-CM

## 2023-02-15 DIAGNOSIS — I63413 Cerebral infarction due to embolism of bilateral middle cerebral arteries: Secondary | ICD-10-CM | POA: Diagnosis not present

## 2023-02-15 DIAGNOSIS — E119 Type 2 diabetes mellitus without complications: Secondary | ICD-10-CM | POA: Diagnosis not present

## 2023-02-15 DIAGNOSIS — D72829 Elevated white blood cell count, unspecified: Secondary | ICD-10-CM | POA: Diagnosis not present

## 2023-02-15 DIAGNOSIS — I462 Cardiac arrest due to underlying cardiac condition: Secondary | ICD-10-CM | POA: Diagnosis not present

## 2023-02-15 DIAGNOSIS — I609 Nontraumatic subarachnoid hemorrhage, unspecified: Secondary | ICD-10-CM | POA: Diagnosis present

## 2023-02-15 DIAGNOSIS — E876 Hypokalemia: Secondary | ICD-10-CM | POA: Diagnosis present

## 2023-02-15 DIAGNOSIS — I472 Ventricular tachycardia, unspecified: Secondary | ICD-10-CM | POA: Diagnosis not present

## 2023-02-15 DIAGNOSIS — Z885 Allergy status to narcotic agent status: Secondary | ICD-10-CM

## 2023-02-15 DIAGNOSIS — R29702 NIHSS score 2: Secondary | ICD-10-CM | POA: Diagnosis present

## 2023-02-15 DIAGNOSIS — Z452 Encounter for adjustment and management of vascular access device: Secondary | ICD-10-CM | POA: Diagnosis not present

## 2023-02-15 DIAGNOSIS — Z743 Need for continuous supervision: Secondary | ICD-10-CM | POA: Diagnosis not present

## 2023-02-15 DIAGNOSIS — I110 Hypertensive heart disease with heart failure: Secondary | ICD-10-CM | POA: Diagnosis not present

## 2023-02-15 DIAGNOSIS — F32A Depression, unspecified: Secondary | ICD-10-CM | POA: Diagnosis present

## 2023-02-15 DIAGNOSIS — Z833 Family history of diabetes mellitus: Secondary | ICD-10-CM

## 2023-02-15 DIAGNOSIS — E872 Acidosis, unspecified: Secondary | ICD-10-CM | POA: Diagnosis present

## 2023-02-15 DIAGNOSIS — Z886 Allergy status to analgesic agent status: Secondary | ICD-10-CM

## 2023-02-15 DIAGNOSIS — Z6841 Body Mass Index (BMI) 40.0 and over, adult: Secondary | ICD-10-CM

## 2023-02-15 DIAGNOSIS — I469 Cardiac arrest, cause unspecified: Secondary | ICD-10-CM | POA: Diagnosis not present

## 2023-02-15 DIAGNOSIS — R000 Tachycardia, unspecified: Secondary | ICD-10-CM | POA: Diagnosis not present

## 2023-02-15 DIAGNOSIS — E871 Hypo-osmolality and hyponatremia: Secondary | ICD-10-CM | POA: Diagnosis present

## 2023-02-15 DIAGNOSIS — Z87891 Personal history of nicotine dependence: Secondary | ICD-10-CM | POA: Diagnosis not present

## 2023-02-15 DIAGNOSIS — Z888 Allergy status to other drugs, medicaments and biological substances status: Secondary | ICD-10-CM

## 2023-02-15 DIAGNOSIS — I5021 Acute systolic (congestive) heart failure: Secondary | ICD-10-CM | POA: Diagnosis not present

## 2023-02-15 DIAGNOSIS — I63411 Cerebral infarction due to embolism of right middle cerebral artery: Secondary | ICD-10-CM | POA: Diagnosis present

## 2023-02-15 DIAGNOSIS — Z1152 Encounter for screening for COVID-19: Secondary | ICD-10-CM | POA: Diagnosis not present

## 2023-02-15 DIAGNOSIS — Z880 Allergy status to penicillin: Secondary | ICD-10-CM

## 2023-02-15 DIAGNOSIS — G459 Transient cerebral ischemic attack, unspecified: Secondary | ICD-10-CM | POA: Diagnosis not present

## 2023-02-15 DIAGNOSIS — J9601 Acute respiratory failure with hypoxia: Secondary | ICD-10-CM | POA: Diagnosis not present

## 2023-02-15 DIAGNOSIS — I4901 Ventricular fibrillation: Secondary | ICD-10-CM | POA: Diagnosis present

## 2023-02-15 DIAGNOSIS — Z7984 Long term (current) use of oral hypoglycemic drugs: Secondary | ICD-10-CM

## 2023-02-15 DIAGNOSIS — I6340 Cerebral infarction due to embolism of unspecified cerebral artery: Secondary | ICD-10-CM | POA: Diagnosis not present

## 2023-02-15 DIAGNOSIS — I5043 Acute on chronic combined systolic (congestive) and diastolic (congestive) heart failure: Secondary | ICD-10-CM | POA: Diagnosis not present

## 2023-02-15 DIAGNOSIS — I493 Ventricular premature depolarization: Secondary | ICD-10-CM | POA: Diagnosis not present

## 2023-02-15 DIAGNOSIS — R579 Shock, unspecified: Secondary | ICD-10-CM | POA: Diagnosis not present

## 2023-02-15 DIAGNOSIS — E8881 Metabolic syndrome: Secondary | ICD-10-CM | POA: Diagnosis present

## 2023-02-15 DIAGNOSIS — R29810 Facial weakness: Secondary | ICD-10-CM | POA: Diagnosis not present

## 2023-02-15 DIAGNOSIS — Z7982 Long term (current) use of aspirin: Secondary | ICD-10-CM

## 2023-02-15 DIAGNOSIS — R4781 Slurred speech: Secondary | ICD-10-CM | POA: Diagnosis present

## 2023-02-15 DIAGNOSIS — M1990 Unspecified osteoarthritis, unspecified site: Secondary | ICD-10-CM | POA: Diagnosis present

## 2023-02-15 DIAGNOSIS — I5022 Chronic systolic (congestive) heart failure: Secondary | ICD-10-CM | POA: Diagnosis not present

## 2023-02-15 DIAGNOSIS — Z91040 Latex allergy status: Secondary | ICD-10-CM

## 2023-02-15 DIAGNOSIS — R29818 Other symptoms and signs involving the nervous system: Secondary | ICD-10-CM | POA: Diagnosis not present

## 2023-02-15 DIAGNOSIS — I499 Cardiac arrhythmia, unspecified: Secondary | ICD-10-CM | POA: Diagnosis not present

## 2023-02-15 HISTORY — PX: IR CT HEAD LTD: IMG2386

## 2023-02-15 HISTORY — PX: RADIOLOGY WITH ANESTHESIA: SHX6223

## 2023-02-15 HISTORY — PX: IR PERCUTANEOUS ART THROMBECTOMY/INFUSION INTRACRANIAL INC DIAG ANGIO: IMG6087

## 2023-02-15 HISTORY — PX: IR US GUIDE VASC ACCESS RIGHT: IMG2390

## 2023-02-15 LAB — PROTIME-INR
INR: 1.1 (ref 0.8–1.2)
INR: 1.2 (ref 0.8–1.2)
Prothrombin Time: 14 seconds (ref 11.4–15.2)
Prothrombin Time: 15.2 seconds (ref 11.4–15.2)

## 2023-02-15 LAB — POCT I-STAT 7, (LYTES, BLD GAS, ICA,H+H)
Acid-base deficit: 5 mmol/L — ABNORMAL HIGH (ref 0.0–2.0)
Bicarbonate: 22.4 mmol/L (ref 20.0–28.0)
Calcium, Ion: 1.17 mmol/L (ref 1.15–1.40)
HCT: 45 % (ref 36.0–46.0)
Hemoglobin: 15.3 g/dL — ABNORMAL HIGH (ref 12.0–15.0)
O2 Saturation: 96 %
Potassium: 3.2 mmol/L — ABNORMAL LOW (ref 3.5–5.1)
Sodium: 140 mmol/L (ref 135–145)
TCO2: 24 mmol/L (ref 22–32)
pCO2 arterial: 50.7 mmHg — ABNORMAL HIGH (ref 32–48)
pH, Arterial: 7.253 — ABNORMAL LOW (ref 7.35–7.45)
pO2, Arterial: 96 mmHg (ref 83–108)

## 2023-02-15 LAB — CBC
HCT: 46.9 % — ABNORMAL HIGH (ref 36.0–46.0)
HCT: 44.1 % (ref 36.0–46.0)
Hemoglobin: 14.9 g/dL (ref 12.0–15.0)
Hemoglobin: 13.7 g/dL (ref 12.0–15.0)
MCH: 26.2 pg (ref 26.0–34.0)
MCH: 26.3 pg (ref 26.0–34.0)
MCHC: 31.8 g/dL (ref 30.0–36.0)
MCHC: 31.1 g/dL (ref 30.0–36.0)
MCV: 82.4 fL (ref 80.0–100.0)
MCV: 84.8 fL (ref 80.0–100.0)
Platelets: 337 10*3/uL (ref 150–400)
Platelets: 310 10*3/uL (ref 150–400)
RBC: 5.69 MIL/uL — ABNORMAL HIGH (ref 3.87–5.11)
RBC: 5.2 MIL/uL — ABNORMAL HIGH (ref 3.87–5.11)
RDW: 15.6 % — ABNORMAL HIGH (ref 11.5–15.5)
RDW: 15.6 % — ABNORMAL HIGH (ref 11.5–15.5)
WBC: 11 10*3/uL — ABNORMAL HIGH (ref 4.0–10.5)
WBC: 21.1 10*3/uL — ABNORMAL HIGH (ref 4.0–10.5)
nRBC: 0 % (ref 0.0–0.2)
nRBC: 0 % (ref 0.0–0.2)

## 2023-02-15 LAB — COMPREHENSIVE METABOLIC PANEL
ALT: 24 U/L (ref 0–44)
ALT: 30 U/L (ref 0–44)
AST: 20 U/L (ref 15–41)
AST: 29 U/L (ref 15–41)
Albumin: 3.6 g/dL (ref 3.5–5.0)
Albumin: 3.6 g/dL (ref 3.5–5.0)
Alkaline Phosphatase: 81 U/L (ref 38–126)
Alkaline Phosphatase: 75 U/L (ref 38–126)
Anion gap: 14 (ref 5–15)
Anion gap: 15 (ref 5–15)
BUN: 21 mg/dL — ABNORMAL HIGH (ref 6–20)
BUN: 21 mg/dL — ABNORMAL HIGH (ref 6–20)
CO2: 22 mmol/L (ref 22–32)
CO2: 24 mmol/L (ref 22–32)
Calcium: 9.7 mg/dL (ref 8.9–10.3)
Calcium: 8.8 mg/dL — ABNORMAL LOW (ref 8.9–10.3)
Chloride: 98 mmol/L (ref 98–111)
Chloride: 101 mmol/L (ref 98–111)
Creatinine, Ser: 1.07 mg/dL — ABNORMAL HIGH (ref 0.44–1.00)
Creatinine, Ser: 1.46 mg/dL — ABNORMAL HIGH (ref 0.44–1.00)
GFR, Estimated: >60 mL/min (ref >60)
GFR, Estimated: 43 mL/min — ABNORMAL LOW (ref >60)
Glucose, Bld: 134 mg/dL — ABNORMAL HIGH (ref 70–99)
Glucose, Bld: 318 mg/dL — ABNORMAL HIGH (ref 70–99)
Potassium: 3.9 mmol/L (ref 3.5–5.1)
Potassium: 3.4 mmol/L — ABNORMAL LOW (ref 3.5–5.1)
Sodium: 134 mmol/L — ABNORMAL LOW (ref 135–145)
Sodium: 140 mmol/L (ref 135–145)
Total Bilirubin: 0.5 mg/dL (ref 0.3–1.2)
Total Bilirubin: 0.5 mg/dL (ref 0.3–1.2)
Total Protein: 8.8 g/dL — ABNORMAL HIGH (ref 6.5–8.1)
Total Protein: 8.1 g/dL (ref 6.5–8.1)

## 2023-02-15 LAB — URINALYSIS, ROUTINE W REFLEX MICROSCOPIC
Bilirubin Urine: NEGATIVE
Glucose, UA: 150 mg/dL — AB
Ketones, ur: NEGATIVE mg/dL
Nitrite: NEGATIVE
Protein, ur: NEGATIVE mg/dL
Specific Gravity, Urine: 1.008 (ref 1.005–1.030)
WBC, UA: >50 WBC/hpf (ref 0–5)
pH: 7 (ref 5.0–8.0)

## 2023-02-15 LAB — HEMOGLOBIN A1C
Hgb A1c MFr Bld: 6.1 % — ABNORMAL HIGH (ref 4.8–5.6)
Mean Plasma Glucose: 128.37 mg/dL

## 2023-02-15 LAB — RAPID URINE DRUG SCREEN, HOSP PERFORMED
Amphetamines: NOT DETECTED
Barbiturates: NOT DETECTED
Benzodiazepines: NOT DETECTED
Cocaine: NOT DETECTED
Opiates: NOT DETECTED
Tetrahydrocannabinol: NOT DETECTED

## 2023-02-15 LAB — DIFFERENTIAL
Abs Immature Granulocytes: 0.03 10*3/uL (ref 0.00–0.07)
Basophils Absolute: 0.1 10*3/uL (ref 0.0–0.1)
Basophils Relative: 1 %
Eosinophils Absolute: 0.1 10*3/uL (ref 0.0–0.5)
Eosinophils Relative: 1 %
Immature Granulocytes: 0 %
Lymphocytes Relative: 30 %
Lymphs Abs: 3.3 10*3/uL (ref 0.7–4.0)
Monocytes Absolute: 0.9 10*3/uL (ref 0.1–1.0)
Monocytes Relative: 8 %
Neutro Abs: 6.7 10*3/uL (ref 1.7–7.7)
Neutrophils Relative %: 60 %

## 2023-02-15 LAB — BRAIN NATRIURETIC PEPTIDE: B Natriuretic Peptide: 1305.5 pg/mL — ABNORMAL HIGH (ref 0.0–100.0)

## 2023-02-15 LAB — I-STAT CHEM 8, ED
BUN: 23 mg/dL — ABNORMAL HIGH (ref 6–20)
Calcium, Ion: 1.15 mmol/L (ref 1.15–1.40)
Chloride: 103 mmol/L (ref 98–111)
Creatinine, Ser: 1 mg/dL (ref 0.44–1.00)
Glucose, Bld: 137 mg/dL — ABNORMAL HIGH (ref 70–99)
HCT: 49 % — ABNORMAL HIGH (ref 36.0–46.0)
Hemoglobin: 16.7 g/dL — ABNORMAL HIGH (ref 12.0–15.0)
Potassium: 3.9 mmol/L (ref 3.5–5.1)
Sodium: 136 mmol/L (ref 135–145)
TCO2: 23 mmol/L (ref 22–32)

## 2023-02-15 LAB — GLUCOSE, CAPILLARY
Glucose-Capillary: 253 mg/dL — ABNORMAL HIGH (ref 70–99)
Glucose-Capillary: 318 mg/dL — ABNORMAL HIGH (ref 70–99)
Glucose-Capillary: 238 mg/dL — ABNORMAL HIGH (ref 70–99)

## 2023-02-15 LAB — TROPONIN I (HIGH SENSITIVITY): Troponin I (High Sensitivity): 209 ng/L (ref <18)

## 2023-02-15 LAB — MAGNESIUM: Magnesium: 2.5 mg/dL — ABNORMAL HIGH (ref 1.7–2.4)

## 2023-02-15 LAB — ETHANOL: Alcohol, Ethyl (B): <10 mg/dL (ref <10)

## 2023-02-15 LAB — SARS CORONAVIRUS 2 BY RT PCR: SARS Coronavirus 2 by RT PCR: NEGATIVE

## 2023-02-15 LAB — LACTIC ACID, PLASMA: Lactic Acid, Venous: 2.9 mmol/L (ref 0.5–1.9)

## 2023-02-15 SURGERY — IR WITH ANESTHESIA
Anesthesia: General

## 2023-02-15 MED ORDER — ENOXAPARIN SODIUM 40 MG/0.4ML IJ SOSY
40.0000 mg | PREFILLED_SYRINGE | INTRAMUSCULAR | Status: DC
  Administered 2023-02-16 – 2023-02-17 (×2): 40 mg via SUBCUTANEOUS
  Filled 2023-02-15 (×2): qty 0.4

## 2023-02-15 MED ORDER — MIDAZOLAM HCL 2 MG/2ML IJ SOLN: 4.0000 mg | Freq: Once | INTRAMUSCULAR | Status: AC

## 2023-02-15 MED ORDER — IOHEXOL 300 MG/ML  SOLN
150.0000 mL | Freq: Once | INTRAMUSCULAR | Status: AC | PRN
  Administered 2023-02-15: 40 mL via INTRA_ARTERIAL

## 2023-02-15 MED ORDER — NOREPINEPHRINE 4 MG/250ML-% IV SOLN
INTRAVENOUS | Status: DC | PRN
  Administered 2023-02-15: 4 ug/min via INTRAVENOUS

## 2023-02-15 MED ORDER — ACETAMINOPHEN 325 MG PO TABS: 650.0000 mg | ORAL_TABLET | ORAL | Status: DC | PRN

## 2023-02-15 MED ORDER — POLYETHYLENE GLYCOL 3350 17 G PO PACK: 17.0000 g | PACK | Freq: Every day | ORAL | Status: DC

## 2023-02-15 MED ORDER — FENTANYL CITRATE PF 50 MCG/ML IJ SOSY: 50.0000 ug | PREFILLED_SYRINGE | INTRAMUSCULAR | Status: DC | PRN

## 2023-02-15 MED ORDER — EPINEPHRINE 1 MG/10ML IJ SOSY
PREFILLED_SYRINGE | INTRAMUSCULAR | Status: DC | PRN
  Administered 2023-02-15 (×6): 50 ug via INTRAVENOUS
  Administered 2023-02-15: 1 mg via INTRAVENOUS
  Administered 2023-02-15 (×2): 50 ug via INTRAVENOUS

## 2023-02-15 MED ORDER — DOCUSATE SODIUM 50 MG/5ML PO LIQD: 100.0000 mg | Freq: Two times a day (BID) | ORAL | Status: DC

## 2023-02-15 MED ORDER — ORAL CARE MOUTH RINSE
15.0000 mL | OROMUCOSAL | Status: DC
  Administered 2023-02-15 – 2023-02-17 (×18): 15 mL via OROMUCOSAL

## 2023-02-15 MED ORDER — EPINEPHRINE HCL 5 MG/250ML IV SOLN IN NS
INTRAVENOUS | Status: DC | PRN
  Administered 2023-02-15: 5 ug/min via INTRAVENOUS

## 2023-02-15 MED ORDER — EPHEDRINE SULFATE (PRESSORS) 50 MG/ML IJ SOLN
INTRAMUSCULAR | Status: DC | PRN
  Administered 2023-02-15: 25 mg via INTRAVENOUS

## 2023-02-15 MED ORDER — PANTOPRAZOLE SODIUM 40 MG IV SOLR
40.0000 mg | INTRAVENOUS | Status: DC
  Administered 2023-02-15 – 2023-02-16 (×2): 40 mg via INTRAVENOUS
  Filled 2023-02-15 (×2): qty 10

## 2023-02-15 MED ORDER — VASOPRESSIN 20 UNIT/ML IV SOLN
INTRAVENOUS | Status: DC | PRN
  Administered 2023-02-15 (×3): 2 [IU] via INTRAVENOUS
  Administered 2023-02-15: 10 [IU] via INTRAVENOUS
  Administered 2023-02-15: 4 [IU] via INTRAVENOUS

## 2023-02-15 MED ORDER — ROCURONIUM BROMIDE 10 MG/ML (PF) SYRINGE: 70.0000 mg | PREFILLED_SYRINGE | Freq: Once | INTRAVENOUS | Status: AC

## 2023-02-15 MED ORDER — FENTANYL CITRATE PF 50 MCG/ML IJ SOSY: 50.0000 ug | PREFILLED_SYRINGE | Freq: Once | INTRAMUSCULAR | Status: DC

## 2023-02-15 MED ORDER — EPINEPHRINE 1 MG/10ML IJ SOSY: 1.0000 mg | PREFILLED_SYRINGE | Freq: Once | INTRAMUSCULAR | Status: DC

## 2023-02-15 MED ORDER — EPINEPHRINE HCL 5 MG/250ML IV SOLN IN NS
0.5000 ug/min | INTRAVENOUS | Status: DC
  Administered 2023-02-15: 0.5 ug/min via INTRAVENOUS
  Administered 2023-02-16: 15 ug/min via INTRAVENOUS
  Administered 2023-02-16 (×2): 20 ug/min via INTRAVENOUS
  Filled 2023-02-15 (×3): qty 250

## 2023-02-15 MED ORDER — METOPROLOL TARTRATE 5 MG/5ML IV SOLN
2.5000 mg | Freq: Once | INTRAVENOUS | Status: AC | PRN
  Administered 2023-02-15: 2.5 mg via INTRAVENOUS
  Filled 2023-02-15: qty 5

## 2023-02-15 MED ORDER — STROKE: EARLY STAGES OF RECOVERY BOOK
Freq: Once | Status: AC
  Filled 2023-02-15: qty 1

## 2023-02-15 MED ORDER — EPINEPHRINE HCL 5 MG/250ML IV SOLN IN NS
0.5000 ug/min | INTRAVENOUS | Status: DC
  Filled 2023-02-15: qty 250

## 2023-02-15 MED ORDER — EPINEPHRINE 1 MG/10ML IJ SOSY
PREFILLED_SYRINGE | INTRAMUSCULAR | Status: AC
  Filled 2023-02-15: qty 10

## 2023-02-15 MED ORDER — PROPOFOL 10 MG/ML IV BOLUS
INTRAVENOUS | Status: DC | PRN
  Administered 2023-02-15: 50 mg via INTRAVENOUS
  Administered 2023-02-15: 100 mg via INTRAVENOUS

## 2023-02-15 MED ORDER — SODIUM CHLORIDE 0.9 % IV SOLN: INTRAVENOUS | Status: DC | PRN

## 2023-02-15 MED ORDER — IOHEXOL 350 MG/ML SOLN
75.0000 mL | Freq: Once | INTRAVENOUS | Status: AC | PRN
  Administered 2023-02-15: 75 mL via INTRAVENOUS

## 2023-02-15 MED ORDER — SUCCINYLCHOLINE CHLORIDE 200 MG/10ML IV SOSY
PREFILLED_SYRINGE | INTRAVENOUS | Status: DC | PRN
  Administered 2023-02-15: 100 mg via INTRAVENOUS

## 2023-02-15 MED ORDER — ORAL CARE MOUTH RINSE: 15.0000 mL | OROMUCOSAL | Status: DC | PRN

## 2023-02-15 MED ORDER — SODIUM BICARBONATE 8.4 % IV SOLN
50.0000 meq | Freq: Once | INTRAVENOUS | Status: AC
  Administered 2023-02-15: 50 meq via INTRAVENOUS

## 2023-02-15 MED ORDER — IOHEXOL 300 MG/ML  SOLN
50.0000 mL | Freq: Once | INTRAMUSCULAR | Status: AC | PRN
  Administered 2023-02-15: 35 mL via INTRA_ARTERIAL

## 2023-02-15 MED ORDER — ACETAMINOPHEN 160 MG/5ML PO SOLN: 650.0000 mg | ORAL | Status: DC | PRN

## 2023-02-15 MED ORDER — EPINEPHRINE 1 MG/10ML IJ SOSY: 1.0000 mg | PREFILLED_SYRINGE | Freq: Once | INTRAMUSCULAR | Status: AC

## 2023-02-15 MED ORDER — SODIUM CHLORIDE 0.9 % IV BOLUS: 500.0000 mL | INTRAVENOUS | Status: DC

## 2023-02-15 MED ORDER — FENTANYL CITRATE (PF) 100 MCG/2ML IJ SOLN
INTRAMUSCULAR | Status: DC | PRN
  Administered 2023-02-15: 100 ug via INTRAVENOUS

## 2023-02-15 MED ORDER — FENTANYL CITRATE (PF) 100 MCG/2ML IJ SOLN
INTRAMUSCULAR | Status: AC
  Filled 2023-02-15: qty 2

## 2023-02-15 MED ORDER — SODIUM BICARBONATE 8.4 % IV SOLN
100.0000 meq | Freq: Once | INTRAVENOUS | Status: AC
  Administered 2023-02-15: 100 meq via INTRAVENOUS
  Filled 2023-02-15: qty 50

## 2023-02-15 MED ORDER — SENNOSIDES-DOCUSATE SODIUM 8.6-50 MG PO TABS: 1.0000 | ORAL_TABLET | Freq: Every evening | ORAL | Status: DC | PRN

## 2023-02-15 MED ORDER — VERAPAMIL HCL 2.5 MG/ML IV SOLN
INTRAVENOUS | Status: AC
  Filled 2023-02-15: qty 4

## 2023-02-15 MED ORDER — NOREPINEPHRINE BITARTRATE 1 MG/ML IV SOLN
INTRAVENOUS | Status: DC | PRN
  Administered 2023-02-15: 1 mL via INTRAVENOUS

## 2023-02-15 MED ORDER — ACETAMINOPHEN 650 MG RE SUPP: 650.0000 mg | RECTAL | Status: DC | PRN

## 2023-02-15 MED ORDER — ROCURONIUM BROMIDE 10 MG/ML (PF) SYRINGE
PREFILLED_SYRINGE | INTRAVENOUS | Status: AC
  Administered 2023-02-15: 70 mg via INTRAVENOUS
  Filled 2023-02-15: qty 10

## 2023-02-15 MED ORDER — ALBUMIN HUMAN 5 % IV SOLN: INTRAVENOUS | Status: DC | PRN

## 2023-02-15 MED ORDER — FENTANYL BOLUS VIA INFUSION
50.0000 ug | INTRAVENOUS | Status: DC | PRN
  Administered 2023-02-15 (×2): 25 ug via INTRAVENOUS

## 2023-02-15 MED ORDER — LACTATED RINGERS IV BOLUS
250.0000 mL | Freq: Once | INTRAVENOUS | Status: AC
  Administered 2023-02-15: 250 mL via INTRAVENOUS

## 2023-02-15 MED ORDER — POTASSIUM CHLORIDE 10 MEQ/100ML IV SOLN
10.0000 meq | INTRAVENOUS | Status: AC
  Administered 2023-02-15 (×2): 10 meq via INTRAVENOUS
  Filled 2023-02-15 (×2): qty 100

## 2023-02-15 MED ORDER — MIDAZOLAM HCL 2 MG/2ML IJ SOLN
INTRAMUSCULAR | Status: AC
  Administered 2023-02-15: 4 mg via INTRAVENOUS
  Filled 2023-02-15: qty 4

## 2023-02-15 MED ORDER — PHENYLEPHRINE HCL-NACL 20-0.9 MG/250ML-% IV SOLN
INTRAVENOUS | Status: DC | PRN
  Administered 2023-02-15: 25 ug/min via INTRAVENOUS

## 2023-02-15 MED ORDER — MIDAZOLAM HCL 2 MG/2ML IJ SOLN: 2.0000 mg | Freq: Once | INTRAMUSCULAR | Status: AC

## 2023-02-15 MED ORDER — PROPOFOL 500 MG/50ML IV EMUL
INTRAVENOUS | Status: DC | PRN
  Administered 2023-02-15: 50 ug/kg/min via INTRAVENOUS

## 2023-02-15 MED ORDER — LIDOCAINE 2% (20 MG/ML) 5 ML SYRINGE
INTRAMUSCULAR | Status: DC | PRN
  Administered 2023-02-15: 60 mg via INTRAVENOUS

## 2023-02-15 MED ORDER — PHENYLEPHRINE 80 MCG/ML (10ML) SYRINGE FOR IV PUSH (FOR BLOOD PRESSURE SUPPORT)
PREFILLED_SYRINGE | INTRAVENOUS | Status: DC | PRN
  Administered 2023-02-15: 160 ug via INTRAVENOUS
  Administered 2023-02-15 (×2): 240 ug via INTRAVENOUS
  Administered 2023-02-15: 400 ug via INTRAVENOUS

## 2023-02-15 MED ORDER — EPINEPHRINE 1 MG/10ML IJ SOSY
PREFILLED_SYRINGE | INTRAMUSCULAR | Status: AC
  Administered 2023-02-15: 1 mg
  Filled 2023-02-15: qty 10

## 2023-02-15 MED ORDER — ROCURONIUM BROMIDE 50 MG/5ML IV SOLN
100.0000 mg | Freq: Once | INTRAVENOUS | Status: AC
  Administered 2023-02-15: 100 mg via INTRAVENOUS
  Filled 2023-02-15: qty 10

## 2023-02-15 MED ORDER — MIDAZOLAM HCL 2 MG/2ML IJ SOLN: 1.0000 mg | INTRAMUSCULAR | Status: DC | PRN

## 2023-02-15 MED ORDER — INSULIN ASPART 100 UNIT/ML IJ SOLN
0.0000 [IU] | INTRAMUSCULAR | Status: DC
  Administered 2023-02-15: 11 [IU] via SUBCUTANEOUS
  Administered 2023-02-15 – 2023-02-16 (×2): 5 [IU] via SUBCUTANEOUS
  Administered 2023-02-16: 2 [IU] via SUBCUTANEOUS
  Administered 2023-02-16: 5 [IU] via SUBCUTANEOUS
  Administered 2023-02-16 – 2023-02-17 (×2): 2 [IU] via SUBCUTANEOUS

## 2023-02-15 MED ORDER — ROCURONIUM BROMIDE 10 MG/ML (PF) SYRINGE
PREFILLED_SYRINGE | INTRAVENOUS | Status: DC | PRN
  Administered 2023-02-15 (×2): 50 mg via INTRAVENOUS

## 2023-02-15 MED ORDER — FENTANYL 2500MCG IN NS 250ML (10MCG/ML) PREMIX INFUSION
50.0000 ug/h | INTRAVENOUS | Status: DC
  Administered 2023-02-15: 50 ug/h via INTRAVENOUS
  Filled 2023-02-15: qty 250

## 2023-02-15 NOTE — ED Provider Notes (Signed)
[place] EMERGENCY DEPARTMENT AT [facility] Provider Note   CSN: [id number] Arrival date & time: 02/15/23  1222  An emergency department physician performed an initial assessment on this suspected stroke patient at 1230.  History  Chief Complaint  Patient presents with   Loss of Consciousness    Stephanie Reilly is a 52 y.o. female.  HPI Patient presents for near syncopal episode.  Family reported some weakness and slumping in her chair.  When EMS arrived on scene, this had resolved.  Patient was alert and oriented with them.  CBG was normal.  Patient was hemodynamically stable and route to the hospital.  As they were offloading the ambulance, EMS noted a left-sided facial droop.  This resolved shortly thereafter.  Patient denies any associated numbness.  She currently states that she feels like he needs to pee but denies any other current symptoms.  Per chart review, patient's history includes HTN, DM2, heart failure (new diagnosis), anemia, anxiety, arthritis, depression.  Patient denies any history of seizures.  History per daughter: Patient was in her normal state of health earlier today.  At 11 AM, patient and family were at home, having a conversation about cell phone batteries.  While patient was midsentence, she developed garbled speech, facial asymmetry, and drooling from the left side of her mouth.  Onset of symptoms was 11:08 AM.    Home Medications Prior to Admission medications   Medication Sig Start Date End Date Taking? Authorizing Provider  ascorbic acid (VITAMIN C) 100 MG tablet Take 100 mg by mouth daily. 08/04/17   [provider]  aspirin 81 MG chewable tablet Chew 1 tablet (81 mg total) by mouth daily. 02/13/23   Zannie Cove, MD  Biotin (BIOTIN MAXIMUM STRENGTH) 10 MG TABS Take 10 mg by mouth daily. 08/04/17   [provider]  cetirizine (ZYRTEC) 10 MG tablet Take 10 mg by mouth daily.    [provider]  dapagliflozin  propanediol (FARXIGA) 10 MG TABS tablet Take 1 tablet (10 mg total) by mouth daily. 02/13/23   Zannie Cove, MD  metoprolol succinate (TOPROL-XL) 50 MG 24 hr tablet Take 1 tablet (50 mg total) by mouth daily. Take with or immediately following a meal. 02/13/23   Zannie Cove, MD  sacubitril-valsartan (ENTRESTO) 24-26 MG Take 1 tablet by mouth 2 (two) times daily. 02/12/23   Zannie Cove, MD  spironolactone (ALDACTONE) 25 MG tablet Take 1 tablet (25 mg total) by mouth daily. 02/13/23   Zannie Cove, MD  torsemide (DEMADEX) 20 MG tablet Take 2 tablets (40 mg total) by mouth daily. 02/13/23   Zannie Cove, MD  Vitamin E 45 MG (100 UNIT) CAPS Take 100 Units by mouth daily. 08/04/17   [provider]      Allergies    Paroxetine hcl, Prednisone, Chocolate, Ibuprofen, Iron, Latex, Lisinopril, Naproxen, Tramadol, Tylenol [acetaminophen], Penicillins, and Serotonin    Review of Systems   Review of Systems  Neurological:  Positive for weakness.       Near syncopal episode  All other systems reviewed and are negative.   Physical Exam Updated Vital Signs BP (!) 140/84   Pulse (!) 122   Temp (P) 99.2 F (37.3 C) Comment: Simultaneous filing. User may not have seen previous data.  Resp 18   SpO2 100%  Physical Exam Vitals and nursing note reviewed.  Constitutional:      General: She is not in acute distress.    Appearance: Normal appearance. She is well-developed. She  is not ill-appearing, toxic-appearing or diaphoretic.  HENT:     Head: Normocephalic and atraumatic.     Right Ear: External ear normal.     Left Ear: External ear normal.     Nose: Nose normal.     Mouth/Throat:     Mouth: Mucous membranes are moist.  Eyes:     Extraocular Movements: Extraocular movements intact.     Conjunctiva/sclera: Conjunctivae normal.  Cardiovascular:     Rate and Rhythm: Normal rate and regular rhythm.  Pulmonary:     Effort: Pulmonary effort is normal. No respiratory distress.   Abdominal:     General: There is no distension.     Palpations: Abdomen is soft.     Tenderness: There is no abdominal tenderness.  Musculoskeletal:        General: No swelling. Normal range of motion.     Cervical back: Normal range of motion and neck supple.     Right lower leg: No edema.     Left lower leg: No edema.  Skin:    General: Skin is warm and dry.     Capillary Refill: Capillary refill takes less than 2 seconds.     Coloration: Skin is not jaundiced or pale.  Neurological:     Mental Status: She is alert and oriented to person, place, and time.     Cranial Nerves: Cranial nerve deficit, dysarthria and facial asymmetry present.     Sensory: Sensation is intact. No sensory deficit.     Motor: Motor function is intact. No weakness.     Coordination: Coordination is intact. Finger-Nose-Finger Test normal.  Psychiatric:        Mood and Affect: Mood normal.        Behavior: Behavior normal.        Thought Content: Thought content normal.        Judgment: Judgment normal.     ED Results / Procedures / Treatments   Labs (all labs ordered are listed, but only abnormal results are displayed) Labs Reviewed  CBC - Abnormal; Notable for the following components:      Result Value   WBC 11.0 (*)    RBC 5.69 (*)    HCT 46.9 (*)    RDW 15.6 (*)    All other components within normal limits  COMPREHENSIVE METABOLIC PANEL - Abnormal; Notable for the following components:   Sodium 134 (*)    Glucose, Bld 134 (*)    BUN 21 (*)    Creatinine, Ser 1.07 (*)    Total Protein 8.8 (*)    All other components within normal limits  URINALYSIS, ROUTINE W REFLEX MICROSCOPIC - Abnormal; Notable for the following components:   APPearance CLOUDY (*)    Glucose, UA 150 (*)    Hgb urine dipstick LARGE (*)    Leukocytes,Ua LARGE (*)    Bacteria, UA MANY (*)    All other components within normal limits  MAGNESIUM - Abnormal; Notable for the following components:   Magnesium 2.5 (*)     All other components within normal limits  I-STAT CHEM 8, ED - Abnormal; Notable for the following components:   BUN 23 (*)    Glucose, Bld 137 (*)    Hemoglobin 16.7 (*)    HCT 49.0 (*)    All other components within normal limits  PROTIME-INR  DIFFERENTIAL  ETHANOL  RAPID URINE DRUG SCREEN, HOSP PERFORMED    EKG EKG Interpretation  Date/Time:  Saturday Feb 15 2023  12:41:01 EDT Ventricular Rate:  119 PR Interval:  163 QRS Duration: 98 QT Interval:  336 QTC Calculation: 473 R Axis:   -64 Text Interpretation: Sinus tachycardia LAE, consider biatrial enlargement Left anterior fascicular block Left ventricular hypertrophy Anterior Q waves, possibly due to LVH Borderline T abnormalities, inferior leads Confirmed by Gloris Manchester [phone]) on 02/15/2023 12:48:54 PM  Radiology CT ANGIO HEAD NECK W WO CM  Result Date: 02/15/2023 CLINICAL DATA:  Provided history: Transient ischemic attack. Syncope, left-sided facial droop. EXAM: CT ANGIOGRAPHY HEAD AND NECK WITH AND WITHOUT CONTRAST TECHNIQUE: Multidetector CT imaging of the head and neck was performed using the standard protocol during bolus administration of intravenous contrast. Multiplanar CT image reconstructions and MIPs were obtained to evaluate the vascular anatomy. Carotid stenosis measurements (when applicable) are obtained utilizing NASCET criteria, using the distal internal carotid diameter as the denominator. RADIATION DOSE REDUCTION: This exam was performed according to the departmental dose-optimization program which includes automated exposure control, adjustment of the mA and/or kV according to patient size and/or use of iterative reconstruction technique. CONTRAST:  Administered contrast not known at this time. COMPARISON:  Head CT performed earlier today 02/15/2023. FINDINGS: CTA NECK FINDINGS Aortic arch: Standard aortic branching. Mild sclerotic plaque within the visualized aortic arch. No hemodynamically significant innominate or  proximal subclavian artery stenosis. Right carotid system: CCA and ICA patent within the neck without stenosis or significant atherosclerotic disease. Tortuosity of the cervical ICA. Left carotid system: CCA and ICA patent within the neck without stenosis or significant atherosclerotic disease. Tortuosity of the cervical ICA. Vertebral arteries: Patent within the neck without stenosis or significant atherosclerotic disease. The left vertebral artery is slightly dominant. Skeleton: No acute fracture. Other neck: No neck mass or cervical lymphadenopathy. Upper chest: No consolidation within the imaged lung apices. 10 mm cyst within the right upper lobe. Review of the MIP images confirms the above findings CTA HEAD FINDINGS Anterior circulation: The intracranial internal carotid arteries are patent. Nonstenotic atherosclerotic plaque within the left cavernous segment. The M1 middle cerebral arteries are patent. Abrupt occlusion of a proximal M2 right MCA vessel (series 12, image 44) (series 11, image 17). No left M2 proximal branch occlusion or high-grade proximal stenosis is identified. The anterior cerebral arteries are patent. Posterior circulation: The intracranial vertebral arteries are patent. The basilar artery is patent. The posterior cerebral arteries are patent. A right posterior communicating artery is present. The left posterior communicating artery is diminutive or absent. Venous sinuses: Within the limitations of contrast timing, no convincing thrombus. Anatomic variants: As described. Review of the MIP images confirms the above findings CTA head impression #1 called by telephone at the time of interpretation on 02/15/2023 at 1:29 pm to provider Gloris Manchester , who verbally acknowledged these results. IMPRESSION: CTA neck: 1. The common carotid, internal carotid and vertebral arteries are patent within the neck without stenosis or significant atherosclerotic disease. 2.  Aortic Atherosclerosis (ICD10-I70.0).  CTA head: 1. Abrupt occlusion of a proximal M2 right middle cerebral artery vessel. Neuro-interventional consultation recommended. 2. Mild non-stenotic atherosclerotic plaque within the cavernous left eye Electronically Signed   By: Jackey Loge D.O.   On: 02/15/2023 13:33   CT HEAD CODE STROKE WO CONTRAST  Result Date: 02/15/2023 CLINICAL DATA:  Code stroke. Neuro deficit, acute, stroke suspected. Syncope. Left-sided facial droop. EXAM: CT HEAD WITHOUT CONTRAST TECHNIQUE: Contiguous axial images were obtained from the base of the skull through the vertex without intravenous contrast. RADIATION DOSE REDUCTION: This exam was performed  according to the departmental dose-optimization program which includes automated exposure control, adjustment of the mA and/or kV according to patient size and/or use of iterative reconstruction technique. COMPARISON:  Report from head CT 05/11/2001 (images unavailable) FINDINGS: Brain: Cerebral volume is normal. A small chronic lacunar infarct is questioned within the left corona radiata (for instance as seen on series 3, image 19) (series 5, image 32). There is no acute intracranial hemorrhage. No demarcated cortical infarct. No extra-axial fluid collection. No evidence of an intracranial mass. No midline shift. Vascular: No hyperdense vessel. Skull: No fracture or aggressive osseous lesion. Sinuses/Orbits: No mass or acute finding within the imaged orbits. No significant paranasal sinus disease at the imaged levels. ASPECTS [facility] Stroke Program Early CT Score) - Ganglionic level infarction (caudate, lentiform nuclei, internal capsule, insula, M1-M3 cortex): 7 - Supraganglionic infarction (M4-M6 cortex): 3 Total score (0-10 with 10 being normal): 10 These results were communicated to Dr. Roda Shutters At 1:04 pmon 5/11/2024by text page via the [facility] messaging system. IMPRESSION: 1. No evidence of an acute intracranial abnormality. ASPECTS is 10. 2. Possible small chronic lacunar infarct  within the left corona radiata. Electronically Signed   By: Jackey Loge D.O.   On: 02/15/2023 13:15    Procedures Procedures    Medications Ordered in ED Medications  iohexol (OMNIPAQUE) 350 MG/ML injection 75 mL (75 mLs Intravenous Contrast Given 02/15/23 1316)    ED Course/ Medical Decision Making/ A&P                             Medical Decision Making Amount and/or Complexity of Data Reviewed Labs: ordered. Radiology: ordered.  Risk Prescription drug management.   This patient presents to the ED for concern of strokelike symptoms, this involves an extensive number of treatment options, and is a complaint that carries with it a high risk of complications and morbidity.  The differential diagnosis includes CVA, TIA, seizure, complex migraine, polypharmacy, metabolic derangements   Co morbidities that complicate the patient evaluation  HTN, T2DM, CHF, anemia, arthritis, anxiety, depression   Additional history obtained:  Additional history obtained from patient's daughter, EMS External records from outside source obtained and reviewed including EMR   Lab Tests:  I Ordered, and personally interpreted labs.  The pertinent results include: Hemoglobin, slight leukocytosis, normal electrolytes, equivocal UA given contamination with squamous epithelial cells   Imaging Studies ordered:  I ordered imaging studies including CT head, CTA head and neck I independently visualized and interpreted imaging which showed proximal M2 right MCA occlusion I agree with the radiologist interpretation   Cardiac Monitoring: / EKG:  The patient was maintained on a cardiac monitor.  I personally viewed and interpreted the cardiac monitored which showed an underlying rhythm of: Sinus rhythm   Consultations Obtained:  I requested consultation with the neurologist, Dr. Roda Shutters,  and discussed lab and imaging findings as well as pertinent plan - they recommend: Admission to neuro  ICU   Problem List / ED Course / Critical interventions / Medication management  Patient presents for questionable strokelike symptoms versus near syncopal episode at home.  When asked what the patient remembers, she states that her family was "scaring her".  They were asking her "are you having a stroke?".  From what EMS gathered, she did slump in her chair.  Patient denies loss of consciousness.  Although EMS did not note any neurologic deficits during transit, patient did develop unilateral left-sided weakness on arrival in  the ED.  On my initial assessment, there was a faint facial asymmetry.  Patient denied any subjective loss of sensation.  As she was being bedded in the ED, she had worsening of left facial droop as well as some slurred speech.  Code stroke was initiated.  CT imaging did reveal a occlusion at proximal M2 right MCA.  I discussed with neurologist on-call, Dr. Roda Shutters, who will admit to neuro ICU.   Social Determinants of Health:  Has access to outpatient care  CRITICAL CARE Performed by: Gloris Manchester   Total critical care time: 32 minutes  Critical care time was exclusive of separately billable procedures and treating other patients.  Critical care was necessary to treat or prevent imminent or life-threatening deterioration.  Critical care was time spent personally by me on the following activities: development of treatment plan with patient and/or surrogate as well as nursing, discussions with consultants, evaluation of patient's response to treatment, examination of patient, obtaining history from patient or surrogate, ordering and performing treatments and interventions, ordering and review of laboratory studies, ordering and review of radiographic studies, pulse oximetry and re-evaluation of patient's condition.         Final Clinical Impression(s) / ED Diagnoses Final diagnoses:  Acute right MCA stroke [facility])    Rx / DC Orders ED Discharge Orders     None          Gloris Manchester, MD 02/15/23 1342

## 2023-02-15 NOTE — Anesthesia Postprocedure Evaluation (Signed)
Anesthesia Post Note  Patient: Stephanie Reilly  Procedure(s) Performed: IR WITH ANESTHESIA     Patient location during evaluation: SICU Anesthesia Type: General Level of consciousness: sedated Pain management: pain level controlled Vital Signs Assessment: post-procedure vital signs reviewed and stable Respiratory status: patient remains intubated per anesthesia plan Cardiovascular status: tachycardic Postop Assessment: no apparent nausea or vomiting Anesthetic complications: yes (Intraoperative hypotension requiring CPR.)   No notable events documented.  Last Vitals:  Vitals:   02/15/23 2043 02/15/23 2044  BP:    Pulse: (!) 141 (!) 141  Resp: (!) 28 (!) 30  Temp:    SpO2: 98% 98%    Last Pain:  Vitals:   02/15/23 1319  PainSc: 0-No pain                 Natisha Trzcinski P Gresham Caetano

## 2023-02-15 NOTE — Progress Notes (Signed)
Verified with CCM MD Dr. Violet Baldy about blood pressure goals. RN asked whether goals are IR goals of SBP 120-160 or goals of SBP>90, MAP>65. CCM MD [place] stated goal for now is SBP>90, MAP>65.

## 2023-02-15 NOTE — Consult Note (Signed)
NAME:  Stephanie Reilly, MRN:  [id number], DOB:  [birth year], LOS: 0 ADMISSION DATE:  02/15/2023, CONSULTATION DATE:  02/05/2023 REFERRING MD:  Lizabeth Leyden of stroke service, CHIEF COMPLAINT:  code stroke -> Caridac arrest    History of Present Illness:   Stephanie Reilly =52 year old female with past medical history of hypertension, hyperlipidemia, diabetes obesity anxiety, depression new diagnosis of systolic heart failure [facility] May, 2024 with ejection fraction 12%].  She was discharged 02/12/2023 on aspirin, torsemide Entresto Aldactone and Farxiga.  She presented acutely on 02/15/2023 with slurred speech and facial droop that resolved prior to coming to the ED and within 1 minute..  Code stroke showed occlusion of proximal right M2.  Diagnosis of acute right MCA ischemic infarct was made.  Referred to interventional radiology and patient intubated for procedure. Immediate post intubation had PEA arest with 2 min CPR and epix x 1 with ROSC. Post ROSC has needed epi gtt (reported not responding to other vasopressors].  And multiple pushes of epinephrine at least 4-5 by the time of transfer to the ICU.  While on epinephrine drip, and underwent diagnostic cerebral angiogram and mechanical thrombectomy for right M2 occlusion with right radial artery access.  She is status post complete recanalization after 3 passes.  Resultant trace subarachnoid hemorrhage in the right sylvian fissure seen.  Systolic blood pressure goal 120-160 and sent to for not ICU.  Critical care medicine consulted.  No thrombolysis given Epinephrine going to peripheral line. On propofol on the ventilator.  Past Medical History:    has a past medical history of Anemia, Anxiety, Arthritis, Depression, Hypertension, and Prediabetes.   reports that she quit smoking about 16 months ago. Her smoking use included cigarettes. She has a 5.50 pack-year smoking history. She has never used smokeless tobacco.  Past Surgical History:  Procedure  Laterality Date   ABDOMINAL HYSTERECTOMY     RIGHT/LEFT HEART CATH AND CORONARY ANGIOGRAPHY N/A 02/10/2023   Procedure: RIGHT/LEFT HEART CATH AND CORONARY ANGIOGRAPHY;  Surgeon: [place], Peter M, MD;  Location: [facility] INVASIVE CV LAB;  Service: Cardiovascular;  Laterality: N/A;   TONSILLECTOMY     TUBAL LIGATION      Allergies  Allergen Reactions   Paroxetine Hcl Anaphylaxis   Prednisone Shortness Of Breath and Other (See Comments)    Confusion, "messes with mental," muscles feel weird, fatigue, insomnia   Chocolate    Ibuprofen    Iron Other (See Comments)    GI upset (reported 08/04/2017). Pt clarifies had constipation, nausea.  Stool softeners did not help.    Latex    Lisinopril Hives   Naproxen    Tramadol Hives   Tylenol [Acetaminophen] Other (See Comments)    With Codeine; GI    Penicillins Rash   Serotonin Nausea Only, Swelling, Anxiety, Rash and Other (See Comments)    GI intolerence    There is no immunization history for the selected administration types on file for this patient.  Family History  Problem Relation Age of Onset   Hypertension Mother    Heart failure Mother    COPD Mother    Diabetes Paternal Grandfather      Current Facility-Administered Medications:    [START ON 02/16/2023]  stroke: early stages of recovery book, , Does not apply, Once, Gevena Mart A, NP   acetaminophen (TYLENOL) tablet 650 mg, 650 mg, Oral, Q4H PRN **OR** acetaminophen (TYLENOL) 160 MG/5ML solution 650 mg, 650 mg, Per Tube, Q4H PRN **OR** acetaminophen (TYLENOL) suppository 650  mg, 650 mg, Rectal, Q4H PRN, Gevena Mart A, NP   enoxaparin (LOVENOX) injection 40 mg, 40 mg, Subcutaneous, Q24H, Wolfe, Denise A, NP   EPINEPHrine (ADRENALIN) 5 mg in NS 250 mL (0.02 mg/mL) premix infusion, 0.5-20 mcg/min, Intravenous, Titrated, Smith, Meredith L, CRNA   iohexol (OMNIPAQUE) 300 MG/ML solution 150 mL, 150 mL, Intra-arterial, Once PRN, de Melchor Amour, Jerilynn Mages, MD   iohexol (OMNIPAQUE)  300 MG/ML solution 50 mL, 50 mL, Intra-arterial, Once PRN, de Melchor Amour, Jette, MD   senna-docusate (Senokot-S) tablet 1 tablet, 1 tablet, Oral, QHS PRN, Gevena Mart A, NP   sodium chloride 0.9 % bolus 500 mL, 500 mL, Intravenous, STAT, Marvel Plan, MD  Current Outpatient Medications:    ascorbic acid (VITAMIN C) 100 MG tablet, Take 100 mg by mouth daily., Disp: , Rfl:    aspirin 81 MG chewable tablet, Chew 1 tablet (81 mg total) by mouth daily., Disp: 30 tablet, Rfl: 0   Biotin (BIOTIN MAXIMUM STRENGTH) 10 MG TABS, Take 10 mg by mouth daily., Disp: , Rfl:    cetirizine (ZYRTEC) 10 MG tablet, Take 10 mg by mouth daily., Disp: , Rfl:    dapagliflozin propanediol (FARXIGA) 10 MG TABS tablet, Take 1 tablet (10 mg total) by mouth daily., Disp: 30 tablet, Rfl: 0   metoprolol succinate (TOPROL-XL) 50 MG 24 hr tablet, Take 1 tablet (50 mg total) by mouth daily. Take with or immediately following a meal., Disp: 30 tablet, Rfl: 0   sacubitril-valsartan (ENTRESTO) 24-26 MG, Take 1 tablet by mouth 2 (two) times daily., Disp: 60 tablet, Rfl: 0   spironolactone (ALDACTONE) 25 MG tablet, Take 1 tablet (25 mg total) by mouth daily., Disp: 30 tablet, Rfl: 0   torsemide (DEMADEX) 20 MG tablet, Take 2 tablets (40 mg total) by mouth daily., Disp: 60 tablet, Rfl: 0   Vitamin E 45 MG (100 UNIT) CAPS, Take 100 Units by mouth daily., Disp: , Rfl:   Facility-Administered Medications Ordered in Other Encounters:    0.9 %  sodium chloride infusion, , Intravenous, Continuous PRN, Carlos American, CRNA, New Bag at 02/15/23 1506   0.9 %  sodium chloride infusion, , Intravenous, Continuous PRN, Carlos American, CRNA, New Bag at 02/15/23 1441   albumin human 5 % solution, , Intravenous, Continuous PRN, Carlos American, CRNA, Stopped at 02/15/23 1556   ePHEDrine injection, , Intravenous, Anesthesia Intra-op, Ellender, Catheryn Bacon, MD, 25 mg at 02/15/23 1500   EPINEPHrine (ADRENALIN) 1 MG/10ML injection, ,  Intravenous, Anesthesia Intra-op, Carlos American, CRNA, 50 mcg at 02/15/23 1613   EPINEPHrine (ADRENALIN) 5 mg in NS 250 mL (0.02 mg/mL) premix infusion, , Intravenous, Continuous PRN, Carlos American, CRNA, Last Rate: 36 mL/hr at 02/15/23 1605, 12 mcg/min at 02/15/23 1605   fentaNYL (SUBLIMAZE) injection, , Intravenous, Anesthesia Intra-op, Carlos American, CRNA, 100 mcg at 02/15/23 1448   lidocaine 2% (20 mg/mL) 5 mL syringe, , Intravenous, Anesthesia Intra-op, Carlos American, CRNA, 60 mg at 02/15/23 1448   norepinephrine (LEVOPHED) 4mg  in (0.016 mg/mL) premix infusion, , Intravenous, Continuous PRN, Carlos American, CRNA, Stopped at 02/15/23 1536   norepinephrine (LEVOPHED) 8 mcg/ml (for boluses) - Optime, , Intravenous, Continuous PRN, Carlos American, CRNA, 1 mL at 02/15/23 1527   phenylephrine (NEO-SYNEPHRINE) 20mg /NS premix infusion, , Intravenous, Continuous PRN, Carlos American, CRNA, Stopped at 02/15/23 1527   PHENYLephrine 80 mcg/ml in normal saline Adult IV Push Syringe (For Blood Pressure Support), , Intravenous, Anesthesia  Tamera Reason, CRNA, 240 mcg at 02/15/23 1513   propofol (DIPRIVAN) 10 mg/mL bolus/IV push, , Intravenous, Anesthesia Intra-op, Carlos American, CRNA, 50 mg at 02/15/23 1453   rocuronium bromide 10 mg/mL (PF) syringe, , Intravenous, Anesthesia Intra-op, Carlos American, CRNA, 50 mg at 02/15/23 1545   succinylcholine (ANECTINE) syringe, , Intravenous, Anesthesia Intra-op, Carlos American, CRNA, 100 mg at 02/15/23 1450   vasopressin (PITRESSIN) 20 UNIT/ML injection, , Intravenous, Anesthesia Intra-op, Carlos American, CRNA, 10 Units at 02/15/23 1527     Significant Hospital Events:  02/15/2023 - admit  Interim History / Subjective:   02/15/2023 - seen in bed 4N19 [facility]  Objective   Blood pressure 97/72, pulse (!) 118, temperature (P) 99.2 F (37.3 C), resp. rate 17, height 5\' 2"  (1.575 m), weight  111.4 kg, SpO2 100 %.        Intake/Output Summary (Last 24 hours) at 02/15/2023 1617 Last data filed at 02/15/2023 1611 Gross per 24 hour  Intake 1050 ml  Output --  Net 1050 ml   Filed Weights   02/15/23 1333  Weight: 111.4 kg    Examination: General: Morbidly obese female.  On the ventilator.  In the ICU room.  Status post rocuronium 20 minutes earlier. HENT: Intubated Lungs: Clear to auscultation bilaterally Cardiovascular: Normal heart sounds hypotensive on epinephrine Abdomen: Obese soft Extremities: No sinus no clubbing no edema Neuro: Intubated on propofol and status post rocuronium GU: Not examined  Resolved Hospital Problem list   x  Assessment & Plan:    PULMONARY  A:  Acute respiratory failure following cardiac arrest upon induction of anesthesia for code stroke. - Present on Admit     P:   Full ventilator support VAP bundle    NEUROLOGIC A:   Code stroke right M2 ischemic occlusion -primary presenting problem -  Present on Admit.   - 02/15/2023 status post mechanical thrombectomy with mild residual subarachnoid hemorrhage in the sylvian fissure.  No thrombolytic given  P:   Blood pressure goal 120-160 [currently lower than that because of shock   CARDIAC STRUCTURAL A: Nonischemic cardiomyopathy diagnosed May 2024 hospitalization -0 sarcoidosis under consideration.  Left ventricular ejection fraction 12%.  Outpatient PET scan pending.  Was on home torsemide  Sustained monomorphic ventricular tachycardia 40 beat early May 2024 in the hospital  PEA cardiac arrest -with return of spontaneous circulation after 1 round CPR. - Present on Admit Postarrest circulatory shock - Present on Admit   - 02/15/2023 sinus tachycardia  HR 130s on epinephrine drip and propofol infusion  P: Get BNP Get troponin Telemetry monitoring May need cardiology consultation   Stop propofol infusion Epinephrine drip systolic blood pressure goal  than 120 - 160  (per CRNA -not responding to other vasopressors)    METABOLIC A: Obesity Diabetes Hypertension Hyperlipidemia Metabolic syndrome  PLAN Sliding scale insulin        Best practice (daily eval):  Diet: N.p.o. but will need tube feeds Pain/Anxiety/Delirium protocol (if indicated): Panel as needed Versed as needed if needed prednisone infusion.  Holding on protocol infusion because of hypotension.  Resolved. VAP protocol (if indicated): Yes DVT prophylaxis: Low-dose Lovenox GI prophylaxis: Protonix Glucose control: SSI Mobility: Bedrest Code Status: Full code  Disposition: ICU  Family Communication: Daughter Hansel Starling [phone] -> called and updatd. Discussed need for central line :    ATTESTATION & SIGNATURE   The patient Stephanie Reilly is critically ill with multiple organ  systems failure and requires high complexity decision making for assessment and support, frequent evaluation and titration of therapies, application of advanced monitoring technologies and extensive interpretation of multiple databases.   Critical Care Time devoted to patient care services described in this note is  45  Minutes. This time reflects time of care of this signee Dr Kalman Shan. This critical care time does not reflect procedure time, or teaching time or supervisory time of PA/NP/Med student/Med Resident etc but could involve care discussion time      SIGNATURE    Dr. Kalman Shan, M.D., F.C.C.P,  Pulmonary and Critical Care Medicine Staff Physician, [facility] Center Director - Interstitial Lung Disease  Program  Pulmonary Fibrosis [facility] Network at [place], [place], [address]  NPI Number:  NPI #[id number]  Pager: [phone], If no answer  -> Check AMION or Try [phone] Telephone (clinical office): [phone] Telephone (research): [phone]  4:17 PM 02/15/2023   02/15/2023 4:17 PM    LABS     PULMONARY Recent Labs  Lab 02/10/23 1442 02/10/23 1450 02/10/23 1451 02/15/23 1251  PHART 7.460*  --   --   --   PCO2ART 32.6  --   --   --   PO2ART 71*  --   --   --   HCO3 23.2 25.2 25.5  --   TCO2 24 26 27 23   O2SAT 95 63 63  --     CBC Recent Labs  Lab 02/10/23 0104 02/10/23 1442 02/11/23 0103 02/15/23 1245 02/15/23 1251  HGB 13.5   < > 13.7 14.9 16.7*  HCT 43.1   < > 43.4 46.9* 49.0*  WBC 10.4  --  8.3 11.0*  --   PLT 291  --  269 337  --    < > = values in this interval not displayed.    COAGULATION Recent Labs  Lab 02/15/23 1245  INR 1.1    CARDIAC  No results for input(s): "TROPONINI" in the last 168 hours. No results for input(s): "PROBNP" in the last 168 hours.  CHEMISTRY Recent Labs  Lab 02/09/23 0323 02/10/23 0104 02/10/23 0259 02/10/23 1442 02/10/23 1451 02/11/23 0103 02/12/23 0040 02/15/23 1245 02/15/23 1251  NA 138 138  --    < > 140 136 133* 134* 136  K 3.2* 4.1  --    < > 4.3 3.8 4.1 3.9 3.9  CL 102 105  --   --   --  104 101 98 103  CO2 24 22  --   --   --  21* 22 22  --   GLUCOSE 115* 112*  --   --   --  98 108* 134* 137*  BUN 13 13  --   --   --  14 19 21* 23*  CREATININE 0.95 0.89  --   --   --  0.83 0.97 1.07* 1.00  CALCIUM 9.3 9.3  --   --   --  9.5 9.5 9.7  --   MG 2.2  --  2.2  --   --  2.2  --  2.5*  --    < > = values in this interval not displayed.   Estimated Creatinine Clearance: 76.6 mL/min (by C-G formula based on SCr of 1 mg/dL).   LIVER Recent Labs  Lab 02/15/23 1245  AST 20  ALT 24  ALKPHOS 81  BILITOT 0.5  PROT 8.8*  ALBUMIN  3.6  INR 1.1     INFECTIOUS No results for input(s): "LATICACIDVEN", "PROCALCITON" in the last 168 hours.   ENDOCRINE CBG (last 3)  No results for input(s): "GLUCAP" in the last 72 hours.       IMAGING x48h  - image(s) personally visualized  -   highlighted in bold CT ANGIO HEAD NECK W WO CM  Result Date: 02/15/2023 CLINICAL DATA:  Provided history: Transient  ischemic attack. Syncope, left-sided facial droop. EXAM: CT ANGIOGRAPHY HEAD AND NECK WITH AND WITHOUT CONTRAST TECHNIQUE: Multidetector CT imaging of the head and neck was performed using the standard protocol during bolus administration of intravenous contrast. Multiplanar CT image reconstructions and MIPs were obtained to evaluate the vascular anatomy. Carotid stenosis measurements (when applicable) are obtained utilizing NASCET criteria, using the distal internal carotid diameter as the denominator. RADIATION DOSE REDUCTION: This exam was performed according to the departmental dose-optimization program which includes automated exposure control, adjustment of the mA and/or kV according to patient size and/or use of iterative reconstruction technique. CONTRAST:  Administered contrast not known at this time. COMPARISON:  Head CT performed earlier today 02/15/2023. FINDINGS: CTA NECK FINDINGS Aortic arch: Standard aortic branching. Mild sclerotic plaque within the visualized aortic arch. No hemodynamically significant innominate or proximal subclavian artery stenosis. Right carotid system: CCA and ICA patent within the neck without stenosis or significant atherosclerotic disease. Tortuosity of the cervical ICA. Left carotid system: CCA and ICA patent within the neck without stenosis or significant atherosclerotic disease. Tortuosity of the cervical ICA. Vertebral arteries: Patent within the neck without stenosis or significant atherosclerotic disease. The left vertebral artery is slightly dominant. Skeleton: No acute fracture. Other neck: No neck mass or cervical lymphadenopathy. Upper chest: No consolidation within the imaged lung apices. 10 mm cyst within the right upper lobe. Review of the MIP images confirms the above findings CTA HEAD FINDINGS Anterior circulation: The intracranial internal carotid arteries are patent. Nonstenotic atherosclerotic plaque within the left cavernous segment. The M1 middle cerebral  arteries are patent. Abrupt occlusion of a proximal M2 right MCA vessel (series 12, image 44) (series 11, image 17). No left M2 proximal branch occlusion or high-grade proximal stenosis is identified. The anterior cerebral arteries are patent. Posterior circulation: The intracranial vertebral arteries are patent. The basilar artery is patent. The posterior cerebral arteries are patent. A right posterior communicating artery is present. The left posterior communicating artery is diminutive or absent. Venous sinuses: Within the limitations of contrast timing, no convincing thrombus. Anatomic variants: As described. Review of the MIP images confirms the above findings CTA head impression #1 called by telephone at the time of interpretation on 02/15/2023 at 1:29 pm to provider Gloris Manchester , who verbally acknowledged these results. IMPRESSION: CTA neck: 1. The common carotid, internal carotid and vertebral arteries are patent within the neck without stenosis or significant atherosclerotic disease. 2.  Aortic Atherosclerosis (ICD10-I70.0). CTA head: 1. Abrupt occlusion of a proximal M2 right middle cerebral artery vessel. Neuro-interventional consultation recommended. 2. Mild non-stenotic atherosclerotic plaque within the cavernous left eye Electronically Signed   By: Jackey Loge D.O.   On: 02/15/2023 13:33   CT HEAD CODE STROKE WO CONTRAST  Result Date: 02/15/2023 CLINICAL DATA:  Code stroke. Neuro deficit, acute, stroke suspected. Syncope. Left-sided facial droop. EXAM: CT HEAD WITHOUT CONTRAST TECHNIQUE: Contiguous axial images were obtained from the base of the skull through the vertex without intravenous contrast. RADIATION DOSE REDUCTION: This exam was performed according to the departmental dose-optimization program  which includes automated exposure control, adjustment of the mA and/or kV according to patient size and/or use of iterative reconstruction technique. COMPARISON:  Report from head CT 05/11/2001  (images unavailable) FINDINGS: Brain: Cerebral volume is normal. A small chronic lacunar infarct is questioned within the left corona radiata (for instance as seen on series 3, image 19) (series 5, image 32). There is no acute intracranial hemorrhage. No demarcated cortical infarct. No extra-axial fluid collection. No evidence of an intracranial mass. No midline shift. Vascular: No hyperdense vessel. Skull: No fracture or aggressive osseous lesion. Sinuses/Orbits: No mass or acute finding within the imaged orbits. No significant paranasal sinus disease at the imaged levels. ASPECTS [facility] Stroke Program Early CT Score) - Ganglionic level infarction (caudate, lentiform nuclei, internal capsule, insula, M1-M3 cortex): 7 - Supraganglionic infarction (M4-M6 cortex): 3 Total score (0-10 with 10 being normal): 10 These results were communicated to Dr. Roda Shutters At 1:04 pmon 5/11/2024by text page via the [facility] messaging system. IMPRESSION: 1. No evidence of an acute intracranial abnormality. ASPECTS is 10. 2. Possible small chronic lacunar infarct within the left corona radiata. Electronically Signed   By: Jackey Loge D.O.   On: 02/15/2023 13:15

## 2023-02-15 NOTE — Progress Notes (Signed)
eLink Physician-Brief Progress Note Patient Name: Nechelle Perrins DOB: 11/05/1970 MRN: [id number]   Date of Service  02/15/2023  HPI/Events of Note  Weaning Epi to off but now BP 180/117 (aline) and HR 155 and no PRNs for both.  BP currently 169/110, HF 140s. Epi drip off for about 5 minutes Had pupils checked and was informed they were equally reactive, sluggish  eICU Interventions  Discussed with bedside RN to continue to monitor now that epi is off. If persistently tachycardic in the next 15 minutes I've ordered metoprolol 2.5 mg IV Patient has a scheduled cranial MRI tonight as patient may be developing Cushing's reflex     Intervention Category Intermediate Interventions: Hypertension - evaluation and management  Rosalie Gums Raye Slyter 02/15/2023, 8:24 PM

## 2023-02-15 NOTE — Progress Notes (Signed)
Called by Dr. Durwin Nora EDP again due to left sided weakness at around 2pm. Left UE 1/5 and left LE 3/5. Come to see pt again in ER, on arrival, pt symptoms again resolved. NIHSS = 0 now. Put her bed in flat position, and give IV NS bolus 500cc. Discussed with pt and her daughter and son at the bedside, not TNK candidate now given NIHSS =0, however, with frequent episodes, I feel her tissue is salvageable from right M2 occlusion and discussed her about IR benefits and risks and she agrees for IR procedure. I also discussed with Dr. Illene Bolus IR and she again discussed with pt and family over the phone, pt expressed understanding and would like to proceed with thrombectomy. Code IR activated.   Marvel Plan, MD PhD Stroke Neurology 02/15/2023 2:37 PM

## 2023-02-15 NOTE — Transfer of Care (Signed)
Immediate Anesthesia Transfer of Care Note  Patient: Stephanie Reilly  Procedure(s) Performed: IR WITH ANESTHESIA  Patient Location: ICU  Anesthesia Type:General  Level of Consciousness: Patient remains intubated per anesthesia plan  Airway & Oxygen Therapy: Patient remains intubated per anesthesia plan and Patient placed on Ventilator (see vital sign flow sheet for setting)  Post-op Assessment: Report given to RN and Post -op Vital signs reviewed and stable  Post vital signs: Reviewed and stable  Last Vitals:  Vitals Value Taken Time  BP 118/66 02/15/23 1700  Temp    Pulse 163 02/15/23 1700  Resp 15 02/15/23 1702  SpO2 78 % 02/15/23 1700  Vitals shown include unvalidated device data.  Last Pain:  Vitals:   02/15/23 1319  PainSc: 0-No pain         Complications: No notable events documented.

## 2023-02-15 NOTE — Progress Notes (Signed)
Stroke Response Note  IR activated 1421. Patient taken to IR bay briefly before into suite. Patient arrested post induction, ACLS protocol followed. Immediate compressions initiated, epi x1 given with ROSC achieved in 2 minutes. Zoll pads were placed. Report given to [facility] IR RN 1540.   Scarlette Slice  Rapid Response RN

## 2023-02-15 NOTE — ED Notes (Signed)
Pt's daughter reports the pt didn't pass out, but was slurring her speech and was making strange noises instead of words. Pt had a L facial droop.

## 2023-02-15 NOTE — ED Notes (Signed)
Dr. Xu to bedside

## 2023-02-15 NOTE — Progress Notes (Signed)
Spoke with E-link MD [place] and CCM NP Janyth Contes about whether RN can use central line for epinephrine administration based on position. Per ground team CCM NP [place], okay to use central line for epinephrine admistration.

## 2023-02-15 NOTE — Anesthesia Postprocedure Evaluation (Signed)
Anesthesia Post Note  Patient: Stephanie Reilly  Procedure(s) Performed: IR WITH ANESTHESIA     Patient location during evaluation: SICU Anesthesia Type: General Level of consciousness: sedated Pain management: pain level controlled Vital Signs Assessment: post-procedure vital signs reviewed and stable Respiratory status: patient remains intubated per anesthesia plan Cardiovascular status: stable Postop Assessment: no apparent nausea or vomiting Anesthetic complications: no   No notable events documented.  Last Vitals:  Vitals:   02/15/23 2043 02/15/23 2044  BP:    Pulse: (!) 141 (!) 141  Resp: (!) 28 (!) 30  Temp:    SpO2: 98% 98%    Last Pain:  Vitals:   02/15/23 1319  PainSc: 0-No pain                 Janyiah Silveri P Ysela Hettinger

## 2023-02-15 NOTE — ED Notes (Signed)
At bedside doing neuro checks. Pt is now unable to raise her L arm. Dr Durwin Nora to bedside.

## 2023-02-15 NOTE — Procedures (Signed)
INTERVENTIONAL NEURORADIOLOGY BRIEF POSTPROCEDURE NOTE  Diagnostic cerebral angiogram and mechanical thrombectomy  Attending: Dr. Baldemar Lenis  Diagnosis: Right M2 occlusion  Access site: Right radial artery  Access closure: TR band  Anesthesia: GETA  Medication used:  Refer to anesthesia documentation  Complications: Patient arrested at induction, ROSC achieved after 2 minutes  Estimated blood loss: 30 mL  Specimen: None  Findings: Occlusion of the right M2/MCA. Complete recanalization achieved after 3 passes (TICI3). Trace SAH/contrast staining in the right Sylvian fissure.   The patient transferred to 4N-ICU.   PLAN: - No femoral access, therefore no procedure related bed rest. - SBP 120-160 mmHg  - Appreciate critical care support

## 2023-02-15 NOTE — Anesthesia Preprocedure Evaluation (Addendum)
Anesthesia Evaluation  Patient identified by MRN, date of birth, ID band Patient awake  Preop documentation limited or incomplete due to emergent nature of procedure.  Airway Mallampati: III       Dental no notable dental hx.    Pulmonary former smoker   Pulmonary exam normal        Cardiovascular hypertension, Pt. on medications and Pt. on home beta blockers +CHF  Normal cardiovascular exam     Neuro/Psych  PSYCHIATRIC DISORDERS Anxiety Depression    CVA    GI/Hepatic ,,,(+)     substance abuse    Endo/Other    Renal/GU      Musculoskeletal   Abdominal   Peds  Hematology   Anesthesia Other Findings CODE STROKE  Reproductive/Obstetrics                             Anesthesia Physical Anesthesia Plan  ASA: 4 and emergent  Anesthesia Plan: General   Post-op Pain Management:    Induction: Intravenous and Rapid sequence  PONV Risk Score and Plan: 3 and Ondansetron, Dexamethasone and Treatment may vary due to age or medical condition  Airway Management Planned: Oral ETT  Additional Equipment: Arterial line  Intra-op Plan:   Post-operative Plan: Extubation in OR  Informed Consent:      Only emergency history available  Plan Discussed with: CRNA  Anesthesia Plan Comments:        Anesthesia Quick Evaluation

## 2023-02-15 NOTE — Procedures (Signed)
Stephanie Reilly  [id number]  [birth year]  Date:02/15/23  Time:7:01 PM   Provider Performing:Tegan Britain   Procedure: Insertion of EZ IO BLUE without US guidance  Indication(s) Medication administration - refractory shock. Peri -arrest  Consent Unable to obtain consent due to emergent nature of procedure.  Anesthesia NOINE. Patient on ventilator on sedtaion  Timeout Verified patient identification, verified procedure, site/side was marked, verified correct patient position, special equipment/implants available, medications/allergies/relevant history reviewed, required imaging and test results available.  Sterile Technique Chlorhexidine  Procedure Description EZ IO done - BLUE COLOR in Left inferior tibial area  Complications/Tolerance None; patient tolerated the procedure well.   EBL NONE  Specimen(s) None      SIGNATURE    Dr. Kalman Shan, M.D., F.C.C.P,  Pulmonary and Critical Care Medicine Staff Physician, [facility] Center Director - Interstitial Lung Disease  Program  Pulmonary Fibrosis [facility] Network at [facility] [place], [place], [address]   Pager: [phone], If no answer  -> Check AMION or Try [phone] Telephone (clinical office): [phone] Telephone (research): [phone]  7:03 PM 02/15/2023

## 2023-02-15 NOTE — Anesthesia Procedure Notes (Signed)
Arterial Line Insertion Start/End5/08/2023 3:00 PM, 02/15/2023 3:05 PM Performed by: Leonides Grills, MD, anesthesiologist  Patient location: OOR procedure area. Preanesthetic checklist: patient identified, IV checked and monitors and equipment checked Emergency situation Left, radial was placed Catheter size: 20 G Hand hygiene performed , maximum sterile barriers used  and Seldinger technique used  Attempts: 1 (Previous attempt by CRNA) Procedure performed without using ultrasound guided (Radiology ultrasound used. No image obtained.) technique. Following insertion, dressing applied and Biopatch. Post procedure assessment: normal and unchanged  Post procedure complications: second provider assisted. Patient tolerated the procedure well with no immediate complications.

## 2023-02-15 NOTE — ED Notes (Signed)
Due to excessive urination foley order placed.

## 2023-02-15 NOTE — Procedures (Signed)
Central Venous Catheter Insertion Procedure Note  Yashasvi Manka  [id number]  1970/11/03  Date:02/15/23  Time:7:06 PM   Provider Performing:Sailor Hevia   Procedure: Insertion of Non-tunneled Central Venous [phone]) with US guidance ([id number])   Indication(s) Medication administration and Difficult access  Consent Risks of the procedure as well as the alternatives and risks of each were explained to the patient and/or caregiver.  Consent for the procedure was obtained and bUT IS NOT SIGNEd due to emergent nature  Anesthesia None intubated sedated and paralyzed  Timeout Verified patient identification, verified procedure, site/side was marked, verified correct patient position, special equipment/implants available, medications/allergies/relevant history reviewed, required imaging and test results available.  Sterile Technique Maximal sterile technique including full sterile barrier drape, hand hygiene, sterile gown, sterile gloves, mask, hair covering, sterile ultrasound probe cover (if used).  Procedure Description Area of catheter insertion was cleaned with chlorhexidine and draped in sterile fashion.  With real-time ultrasound guidance a central venous catheter was placed into the right internal jugular vein. Nonpulsatile blood flow and easy flushing noted in all ports.  The catheter was sutured in place and sterile dressing applied.  Complications/Tolerance None; patient tolerated the procedure well. Chest X-ray is ordered to verify placement for internal jugular or subclavian cannulation.   Chest x-ray is not ordered for femoral cannulation.  EBL Minimal 2cc  Specimen(s) None      SIGNATURE    Dr. Kalman Shan, M.D., F.C.C.P,  Pulmonary and Critical Care Medicine Staff Physician, [facility] Center Director - Interstitial Lung Disease  Program  Pulmonary Fibrosis [facility] Network at [facility] [place], [place],  [address]   Pager: [phone], If no answer  -> Check AMION or Try [phone] Telephone (clinical office): [phone] Telephone (research): [phone]  7:07 PM 02/15/2023

## 2023-02-15 NOTE — IPAL (Signed)
  Interdisciplinary Goals of Care Family Meeting   Date carried out: 02/15/2023  Location of the meeting: Bedside  Member's involved: Physician, Bedside Registered Nurse, Family Member or next of kin, and Other: chaplain later  Durable Power of Attorney or acting medical decision maker: Stephanie Reilly     Discussion: We discussed goals of care for Stephanie Reilly .  Critically ill  Code status:   Code Status: Full Code   Disposition: Continue current acute care   Time spent for the meeting: 20 min    Got consent for CVL but had signfiicant circulatory shock needing epi gtt and pushes and placement of I/O and CVL  Thiw as additional 60 min critical care time without procedure time     SIGNATURE    Dr. Kalman Shan, M.D., F.C.C.P,  Pulmonary and Critical Care Medicine Staff Physician, [facility] Center Director - Interstitial Lung Disease  Program  Pulmonary Fibrosis [facility] Network at [facility] [place], [place], [address]   Pager: [phone], If no answer  -> Check AMION or Try [phone] Telephone (clinical office): [phone] Telephone (research): [phone]  7:00 PM 02/15/2023

## 2023-02-15 NOTE — Progress Notes (Signed)
   02/15/23 1321  Spiritual Encounters  Type of Visit Follow up  Care provided to: Patient;Pt and family  Conversation partners present during Programmer, systems  Referral source Nurse (RN/NT/LPN);Chaplain assessment;Physician  Reason for visit Urgent spiritual support  OnCall Visit No  Spiritual Framework  Presenting Themes Meaning/purpose/sources of inspiration;Goals in life/care;Values and beliefs;Significant life change;Caregiving needs;Coping tools;Impactful experiences and emotions;Courage hope and growth;Rituals and practive;Community and relationships  Community/Connection Family;Friend(s)  Patient Stress Factors None identified  Family Stress Factors Family relationships;Lack of knowledge;Loss;Major life changes;Loss of control  Interventions  Spiritual Care Interventions Made Established relationship of care and support;Compassionate presence;Normalization of emotions;Reconciliation with self/others  Advance Directives (For Healthcare)  Does Patient Have a Medical Advance Directive? No  Would patient like information on creating a medical advance directive? No - Patient declined  Mental Health Advance Directives  Does Patient Have a Mental Health Advance Directive? No  Would patient like information on creating a mental health advance directive? No - Patient declined   Chaplain was called to aid the family of patient who is unable to communicate with them.  Escorted family to conference room and sat with them while MD and RN staff worked to [employer] patient.  Family memebrs prayed an wept and expressed their concerns and emotions.  Will remain available for follow up with PM Shift Chaplain.

## 2023-02-15 NOTE — Anesthesia Procedure Notes (Signed)
Procedure Name: Intubation Date/Time: 02/15/2023 2:51 PM  Performed by: Carlos American, CRNAPre-anesthesia Checklist: Patient identified, Emergency Drugs available, Suction available and Patient being monitored Patient Re-evaluated:Patient Re-evaluated prior to induction Oxygen Delivery Method: Circle System Utilized Preoxygenation: Pre-oxygenation with 100% oxygen Induction Type: IV induction, Rapid sequence and Cricoid Pressure applied Laryngoscope Size: Mac and 3 Grade View: Grade I Tube type: Oral Tube size: 7.0 mm Number of attempts: 1 Airway Equipment and Method: Stylet Placement Confirmation: ETT inserted through vocal cords under direct vision, positive ETCO2 and breath sounds checked- equal and bilateral Secured at: 22 cm Tube secured with: Tape Dental Injury: Teeth and Oropharynx as per pre-operative assessment

## 2023-02-15 NOTE — ED Notes (Addendum)
Patient arrived by [facility] after syncopal event. Patient with slurred speech and facial droop; activated as code stroke at 1244. To CT at 1249

## 2023-02-15 NOTE — Progress Notes (Signed)
eLink Physician-Brief Progress Note Patient Name: Chelsei Beidleman DOB: 04-Apr-1971 MRN: [id number]   Date of Service  02/15/2023  HPI/Events of Note  Critical labs: Lactc 2.9 and Trop 209 (only ones drawn)  Also wtih ectopy, K+ 3.4, creat 1.46 and GFR 43  BP 84/55  HR 117 now back on epinephrine drip He did receive the metoprolol 25 mg IV earlier  Foley bag has 400 cc output on initial foley cath insertion  eICU Interventions  Ordered potassium 10 meqs x 2 doses Ordered a 250 cc LR bolus Monitor UO closely Discussed with bedside RN     Intervention Category Major Interventions: Electrolyte abnormality - evaluation and management;Other:;Hypotension - evaluation and management  [name] 02/15/2023, 9:40 PM

## 2023-02-15 NOTE — H&P (Signed)
Stroke Neurology H&P  CC: Code stroke   History is obtained from:patient, daughter and medical record   HPI: Stephanie Reilly is a 52 y.o. female with past medical history of HTN, HLD, anxiety and depression, newly diagnoses CHF, DM, IDA, and morbid obesity who presented to [facility], ED via EMS for acute onset of slurred speech and facial droop which had resolved prior to ED.  Neurological exam was fluctuating and code stroke was activated by EDP.  Code stroke CT head with no acute process.  Aspects 10.  CTA head with occlusion of proximal right M2.  NIH 2.  LKW 1230 Per daughter at the bedside she states that around 11 AM they were sitting at the table talking and all of a sudden her mom started making noises and could not get her words out and had a facial droop, which resolved within 1 minute.  They assisted her to the bedroom to lay down in the bed and they noticed that again her facial droop had come back and now had slurred speech.  At this point they called EMS.  On arrival to the ED patient was noted to have a facial droop which then had resolved and then was noted to have a left-sided facial droop with slurred speech again in the emergency room and it was this point that they decided to activate a code stroke.   Patient with recent hospitalization and discharged on Feb 12, 2023 with newly diagnosed cardiomyopathy and congestive heart failure with EF<20%.  MRI heart showed EF 12%. She was started on ASA 81,  torsemide, Entresto, Aldactone and [place].   We will admit patient to ICU for close neurological monitoring, stat MRI and potential heparin IV if no large infarct on MRI.    LKW: 1230  tpa given?: no, symptoms resolved IR: no, transient symptoms, will need close monitoring Modified Rankin Scale: 0-Completely asymptomatic and back to baseline post- stroke  ROS: A 14 point ROS was performed and is negative except as noted in the HPI.   Past Medical History:  Diagnosis Date   Anemia     Anxiety    Arthritis    Depression    Hypertension    Prediabetes      Family History  Problem Relation Age of Onset   Hypertension Mother    Heart failure Mother    COPD Mother    Diabetes Paternal Grandfather      Social History:  reports that she quit smoking about 16 months ago. Her smoking use included cigarettes. She has a 5.50 pack-year smoking history. She has never used smokeless tobacco. She reports current drug use. Drug: Marijuana. She reports that she does not drink alcohol.   Exam: Current vital signs: BP (!) 140/84   Pulse (!) 122   Temp (P) 99.2 F (37.3 C) Comment: Simultaneous filing. User may not have seen previous data.  Resp 18   SpO2 100%  Vital signs in last 24 hours: Temp:  [99.2 F (37.3 C)] (P) 99.2 F (37.3 C) (05/11 1238) Pulse Rate:  [122] 122 (05/11 1238) Resp:  [18] 18 (05/11 1238) BP: (116-140)/(84) 140/84 (05/11 1318) SpO2:  [100 %] 100 % (05/11 1238)  Physical Exam  Constitutional: Appears well-developed and well-nourished.  Psych: Affect appropriate to situation Eyes: No scleral injection HENT: No OP obstrucion Head: Normocephalic.  Cardiovascular: Normal rate and regular rhythm.  Respiratory: Effort normal and breath sounds normal to anterior ascultation GI: Soft.  No distension. There is no  tenderness.  Skin: WDI  Neuro: Mental Status: Patient is awake, alert, oriented to person, place, month, year, and situation. Patient is able to give a clear and coherent history. No signs of aphasia or neglect Cranial Nerves: II: Visual Fields are full. Pupils are equal, round, and reactive to light.   III,IV, VI: EOMI without ptosis or diploplia.  V: Facial sensation is symmetric to temperature VII: Facial movement is symmetric.  VIII: hearing is intact to voice X: Uvula elevates symmetrically XI: Shoulder shrug is symmetric. XII: tongue is midline without atrophy or fasciculations.  Motor: Tone is normal. Bulk is normal. 5/5  strength was present in all four extremities.  Sensory: Sensation is symmetric to light touch and temperature in the arms and legs. Cerebellar: FNF and HKS are intact bilaterally   I have reviewed labs in epic and the results pertinent to this consultation are: UDS negative WBC 11 Sodium 134 Creatinine 1.07  I have reviewed the images obtained:  CT head with no acute abnormality aspects 10  CTA head and neck with right proximal M2 occlusion  Primary Diagnosis:  Acute right MCA ischemic infarct  Secondary Diagnosis: Essential (primary) hypertension, Chronic systolic (congestive) heart failure, Type 2 diabetes mellitus w/o complications, Morbid Obesity(BMI > 40), and Hyponatremia   Recommendations: - Admit to the neuro ICU for close neurological monitoring - HgbA1c, fasting lipid panel - MRI of the brain without contrast - Frequent neuro checks -Bedside swallow eval.  If passes can initiate carb consistent diet -Systolic blood pressure goal less than 220/105 - Risk factor modification - Telemetry monitoring - PT consult, OT consult, Speech consult -Will need to consult cards tomorrow possible start of anticoagulation - Stroke team to follow   Gevena Mart DNP, ACNPC-AG  [facility]  ATTENDING NOTE: I reviewed above note and agree with the assessment and plan. Pt was seen and examined.   Pt symptoms again resolved on my exam. Discussed with her and her daughter, given her resolution of symptoms, not TNK candidate at this time and may consider heparin IV if MRI did not show large stroke. Will need admit to ICU for close monitoring, if neuro deterioration, she will need TNK if still within the window and consider IR. Pt and daughter expressed understanding.   For detailed assessment and plan, please refer to above/below as I have made changes wherever appropriate.   Marvel Plan, MD PhD Stroke Neurology 02/15/2023 2:29 PM  This patient is critically ill due  to LVO with fluctuating symptoms, CHF with low EF and at significant risk of neurological worsening, death form recurrent stroke, hemorrhagic transformation. This patient's care requires constant monitoring of vital signs, hemodynamics, respiratory and cardiac monitoring, review of multiple databases, neurological assessment, discussion with family, other specialists and medical decision making of high complexity. I spent 45 minutes of neurocritical care time in the care of this patient.

## 2023-02-15 NOTE — Progress Notes (Signed)
SLP Cancellation Note  Patient Details Name: Stephanie Reilly MRN: [id number] DOB: Apr 11, 1971   Cancelled treatment:       Reason Eval/Treat Not Completed: Medical issues which prohibited therapy; pt off the floor for thrombectomy. Will follow along for readiness for speech language evaluation   Ardyth Gal MA, CCC-SLP Acute Rehabilitation Services   02/15/2023, 2:50 PM

## 2023-02-15 NOTE — Progress Notes (Signed)
eLink Physician-Brief Progress Note Patient Name: Stephanie Reilly DOB: [birth year] MRN: [id number]   Date of Service  02/15/2023  HPI/Events of Note  Received request to re-order epinephrine drip that's running. Also asked to review central line placement.  eICU Interventions  Epinephrine re-ordered currently running via IO Xray reviewed and tip of central line lying a bit high. Requested bedside team to assess. Discussed with bedside RN        [name] 02/15/2023, 7:56 PM

## 2023-02-16 ENCOUNTER — Other Ambulatory Visit: Payer: Self-pay

## 2023-02-16 ENCOUNTER — Inpatient Hospital Stay (HOSPITAL_COMMUNITY): Payer: Medicaid Other

## 2023-02-16 ENCOUNTER — Encounter (HOSPITAL_COMMUNITY): Payer: Self-pay | Admitting: Radiology

## 2023-02-16 DIAGNOSIS — I63411 Cerebral infarction due to embolism of right middle cerebral artery: Secondary | ICD-10-CM | POA: Diagnosis not present

## 2023-02-16 DIAGNOSIS — Z4682 Encounter for fitting and adjustment of non-vascular catheter: Secondary | ICD-10-CM | POA: Diagnosis not present

## 2023-02-16 DIAGNOSIS — I469 Cardiac arrest, cause unspecified: Secondary | ICD-10-CM | POA: Diagnosis not present

## 2023-02-16 DIAGNOSIS — I5021 Acute systolic (congestive) heart failure: Secondary | ICD-10-CM

## 2023-02-16 DIAGNOSIS — R579 Shock, unspecified: Secondary | ICD-10-CM

## 2023-02-16 LAB — POCT I-STAT 7, (LYTES, BLD GAS, ICA,H+H)
Acid-base deficit: 4 mmol/L — ABNORMAL HIGH (ref 0.0–2.0)
Acid-base deficit: 2 mmol/L (ref 0.0–2.0)
Bicarbonate: 23 mmol/L (ref 20.0–28.0)
Bicarbonate: 22.7 mmol/L (ref 20.0–28.0)
Calcium, Ion: 1.19 mmol/L (ref 1.15–1.40)
Calcium, Ion: 1.26 mmol/L (ref 1.15–1.40)
HCT: 40 % (ref 36.0–46.0)
HCT: 42 % (ref 36.0–46.0)
Hemoglobin: 13.6 g/dL (ref 12.0–15.0)
Hemoglobin: 14.3 g/dL (ref 12.0–15.0)
O2 Saturation: 98 %
O2 Saturation: 99 %
Patient temperature: 99.2
Potassium: 3.3 mmol/L — ABNORMAL LOW (ref 3.5–5.1)
Potassium: 3.4 mmol/L — ABNORMAL LOW (ref 3.5–5.1)
Sodium: 136 mmol/L (ref 135–145)
Sodium: 143 mmol/L (ref 135–145)
TCO2: 24 mmol/L (ref 22–32)
TCO2: 24 mmol/L (ref 22–32)
pCO2 arterial: 49.2 mmHg — ABNORMAL HIGH (ref 32–48)
pCO2 arterial: 38.8 mmHg (ref 32–48)
pH, Arterial: 7.277 — ABNORMAL LOW (ref 7.35–7.45)
pH, Arterial: 7.378 (ref 7.35–7.45)
pO2, Arterial: 120 mmHg — ABNORMAL HIGH (ref 83–108)
pO2, Arterial: 119 mmHg — ABNORMAL HIGH (ref 83–108)

## 2023-02-16 LAB — GLUCOSE, CAPILLARY
Glucose-Capillary: 119 mg/dL — ABNORMAL HIGH (ref 70–99)
Glucose-Capillary: 122 mg/dL — ABNORMAL HIGH (ref 70–99)
Glucose-Capillary: 135 mg/dL — ABNORMAL HIGH (ref 70–99)
Glucose-Capillary: 138 mg/dL — ABNORMAL HIGH (ref 70–99)
Glucose-Capillary: 201 mg/dL — ABNORMAL HIGH (ref 70–99)
Glucose-Capillary: 207 mg/dL — ABNORMAL HIGH (ref 70–99)

## 2023-02-16 LAB — COOXEMETRY PANEL
Carboxyhemoglobin: 1.6 % — ABNORMAL HIGH (ref 0.5–1.5)
Methemoglobin: <0.7 % (ref 0.0–1.5)
O2 Saturation: 82.6 %
Total hemoglobin: 11.3 g/dL — ABNORMAL LOW (ref 12.0–16.0)

## 2023-02-16 LAB — ECHOCARDIOGRAM COMPLETE
Area-P 1/2: 6.96 cm2
Calc EF: 31.3 %
Est EF: <20
Height: 62 in
S' Lateral: 5.9 cm
Single Plane A2C EF: 35.2 %
Single Plane A4C EF: 26.2 %
Weight: 3929.48 oz

## 2023-02-16 LAB — CBC
HCT: 40.2 % (ref 36.0–46.0)
Hemoglobin: 12.6 g/dL (ref 12.0–15.0)
MCH: 26.8 pg (ref 26.0–34.0)
MCHC: 31.3 g/dL (ref 30.0–36.0)
MCV: 85.4 fL (ref 80.0–100.0)
Platelets: 246 10*3/uL (ref 150–400)
RBC: 4.71 MIL/uL (ref 3.87–5.11)
RDW: 15.7 % — ABNORMAL HIGH (ref 11.5–15.5)
WBC: 17.6 10*3/uL — ABNORMAL HIGH (ref 4.0–10.5)
nRBC: 0 % (ref 0.0–0.2)

## 2023-02-16 LAB — COMPREHENSIVE METABOLIC PANEL
ALT: 24 U/L (ref 0–44)
AST: 21 U/L (ref 15–41)
Albumin: 3.3 g/dL — ABNORMAL LOW (ref 3.5–5.0)
Alkaline Phosphatase: 66 U/L (ref 38–126)
Anion gap: 13 (ref 5–15)
BUN: 20 mg/dL (ref 6–20)
CO2: 25 mmol/L (ref 22–32)
Calcium: 9.6 mg/dL (ref 8.9–10.3)
Chloride: 102 mmol/L (ref 98–111)
Creatinine, Ser: 1.17 mg/dL — ABNORMAL HIGH (ref 0.44–1.00)
GFR, Estimated: 56 mL/min — ABNORMAL LOW (ref >60)
Glucose, Bld: 181 mg/dL — ABNORMAL HIGH (ref 70–99)
Potassium: 3.3 mmol/L — ABNORMAL LOW (ref 3.5–5.1)
Sodium: 140 mmol/L (ref 135–145)
Total Bilirubin: 0.6 mg/dL (ref 0.3–1.2)
Total Protein: 7.7 g/dL (ref 6.5–8.1)

## 2023-02-16 LAB — LIPID PANEL
Cholesterol: 155 mg/dL (ref 0–200)
HDL: 45 mg/dL (ref >40)
LDL Cholesterol: 98 mg/dL (ref 0–99)
Total CHOL/HDL Ratio: 3.4 RATIO
Triglycerides: 62 mg/dL (ref <150)
VLDL: 12 mg/dL (ref 0–40)

## 2023-02-16 LAB — MAGNESIUM: Magnesium: 2.3 mg/dL (ref 1.7–2.4)

## 2023-02-16 LAB — TROPONIN I (HIGH SENSITIVITY): Troponin I (High Sensitivity): 217 ng/L (ref <18)

## 2023-02-16 LAB — PHOSPHORUS: Phosphorus: 3 mg/dL (ref 2.5–4.6)

## 2023-02-16 MED ORDER — SODIUM CHLORIDE 0.9% FLUSH: 10.0000 mL | INTRAVENOUS | Status: DC | PRN

## 2023-02-16 MED ORDER — ALBUMIN HUMAN 25 % IV SOLN
12.5000 g | Freq: Once | INTRAVENOUS | Status: AC
  Administered 2023-02-16: 12.5 g via INTRAVENOUS
  Filled 2023-02-16: qty 50

## 2023-02-16 MED ORDER — NOREPINEPHRINE 4 MG/250ML-% IV SOLN
0.0000 ug/min | INTRAVENOUS | Status: DC
  Administered 2023-02-16 (×3): 14 ug/min via INTRAVENOUS
  Administered 2023-02-16: 20 ug/min via INTRAVENOUS
  Administered 2023-02-16: 2 ug/min via INTRAVENOUS
  Administered 2023-02-17: 14 ug/min via INTRAVENOUS
  Administered 2023-02-17: 8 ug/min via INTRAVENOUS
  Filled 2023-02-16 (×7): qty 250

## 2023-02-16 MED ORDER — POLYETHYLENE GLYCOL 3350 17 G PO PACK
17.0000 g | PACK | Freq: Every day | ORAL | Status: DC
  Administered 2023-02-18 – 2023-02-20 (×2): 17 g via ORAL
  Filled 2023-02-16 (×3): qty 1

## 2023-02-16 MED ORDER — DOCUSATE SODIUM 50 MG/5ML PO LIQD: 100.0000 mg | Freq: Two times a day (BID) | ORAL | Status: DC

## 2023-02-16 MED ORDER — ACETAMINOPHEN 650 MG RE SUPP: 650.0000 mg | RECTAL | Status: DC | PRN

## 2023-02-16 MED ORDER — ACETAMINOPHEN 325 MG PO TABS: 650.0000 mg | ORAL_TABLET | ORAL | Status: DC | PRN

## 2023-02-16 MED ORDER — SODIUM CHLORIDE 0.9% FLUSH
10.0000 mL | Freq: Two times a day (BID) | INTRAVENOUS | Status: DC
  Administered 2023-02-16 – 2023-02-17 (×3): 10 mL
  Administered 2023-02-18: 30 mL

## 2023-02-16 MED ORDER — POTASSIUM CHLORIDE 10 MEQ/100ML IV SOLN
10.0000 meq | INTRAVENOUS | Status: AC
  Administered 2023-02-16 (×3): 10 meq via INTRAVENOUS
  Filled 2023-02-16 (×3): qty 100

## 2023-02-16 MED ORDER — CHLORHEXIDINE GLUCONATE CLOTH 2 % EX PADS
6.0000 | MEDICATED_PAD | Freq: Every day | CUTANEOUS | Status: DC
  Administered 2023-02-16 – 2023-02-19 (×4): 6 via TOPICAL

## 2023-02-16 MED ORDER — ACETAMINOPHEN 160 MG/5ML PO SOLN: 650.0000 mg | ORAL | Status: DC | PRN

## 2023-02-16 MED ORDER — LACTATED RINGERS IV BOLUS: 250.0000 mL | Freq: Once | INTRAVENOUS | Status: DC

## 2023-02-16 MED ORDER — ROSUVASTATIN CALCIUM 20 MG PO TABS: 20.0000 mg | ORAL_TABLET | Freq: Every day | ORAL | Status: DC

## 2023-02-16 MED ORDER — ASPIRIN 325 MG PO TABS: 325.0000 mg | ORAL_TABLET | Freq: Every day | ORAL | Status: DC

## 2023-02-16 MED ORDER — ROSUVASTATIN CALCIUM 20 MG PO TABS
20.0000 mg | ORAL_TABLET | Freq: Every day | ORAL | Status: DC
  Administered 2023-02-17 – 2023-02-20 (×4): 20 mg via ORAL
  Filled 2023-02-16 (×4): qty 1

## 2023-02-16 MED ORDER — ASPIRIN 325 MG PO TABS
325.0000 mg | ORAL_TABLET | Freq: Every day | ORAL | Status: DC
  Administered 2023-02-17: 325 mg via ORAL
  Filled 2023-02-16: qty 1

## 2023-02-16 NOTE — Progress Notes (Signed)
*  PRELIMINARY RESULTS* Echocardiogram 2D Echocardiogram has been performed.  Stephanie Reilly 02/16/2023, 3:13 PM

## 2023-02-16 NOTE — Progress Notes (Signed)
Referring Physician(s): Dr Jerel Shepherd  Supervising Physician: Baldemar Lenis  Patient Status:  [facility] - In-pt  Chief Complaint:  CVA  Subjective:  Occlusion of the right M2/MCA. Complete recanalization achieved after 3 passes (TICI3). Trace SAH/contrast staining in the right Sylvian fissure.   Pt is open eyed and alert this am Thumbs up Moving all 4s to command Intubated  Allergies: Paroxetine hcl, Prednisone, Chocolate, Ibuprofen, Iron, Latex, Lisinopril, Naproxen, Tramadol, Tylenol [acetaminophen], Penicillins, and Serotonin  Medications: Prior to Admission medications   Medication Sig Start Date End Date Taking? Authorizing Provider  ascorbic acid (VITAMIN C) 100 MG tablet Take 100 mg by mouth daily. 08/04/17   [provider]  aspirin 81 MG chewable tablet Chew 1 tablet (81 mg total) by mouth daily. 02/13/23   Zannie Cove, MD  Biotin (BIOTIN MAXIMUM STRENGTH) 10 MG TABS Take 10 mg by mouth daily. 08/04/17   [provider]  cetirizine (ZYRTEC) 10 MG tablet Take 10 mg by mouth daily.    [provider]  dapagliflozin propanediol (FARXIGA) 10 MG TABS tablet Take 1 tablet (10 mg total) by mouth daily. 02/13/23   Zannie Cove, MD  metoprolol succinate (TOPROL-XL) 50 MG 24 hr tablet Take 1 tablet (50 mg total) by mouth daily. Take with or immediately following a meal. 02/13/23   Zannie Cove, MD  sacubitril-valsartan (ENTRESTO) 24-26 MG Take 1 tablet by mouth 2 (two) times daily. 02/12/23   Zannie Cove, MD  spironolactone (ALDACTONE) 25 MG tablet Take 1 tablet (25 mg total) by mouth daily. 02/13/23   Zannie Cove, MD  torsemide (DEMADEX) 20 MG tablet Take 2 tablets (40 mg total) by mouth daily. 02/13/23   Zannie Cove, MD  Vitamin E 45 MG (100 UNIT) CAPS Take 100 Units by mouth daily. 08/04/17   [provider]     Vital Signs: BP (!) 152/123 Comment: levo titrated down  Pulse (!) 115   Temp 99.2 F (37.3 C) (Axillary)    Resp (!) 22   Ht 5\' 2"  (1.575 m)   Wt 245 lb 9.5 oz (111.4 kg)   SpO2 99%   BMI 44.92 kg/m   Physical Exam Constitutional:      Comments: Intubated  Eyes:     Comments: Follows command for EOM--- all quadrants   Musculoskeletal:     Comments: Moves all 4s to command Gives thumbs up sign Radial site is NT no bleeding; no hematoma    Neurological:     Mental Status: She is alert.     Imaging: DG CHEST PORT 1 VIEW  Result Date: 02/15/2023 CLINICAL DATA:  Check central line placement EXAM: PORTABLE CHEST 1 VIEW COMPARISON:  02/07/2023 FINDINGS: New right jugular central line is noted with the catheter tip in the region of the right innominate vein. Endotracheal tube is noted in satisfactory position. Cardiac shadow is enlarged but stable. The lungs are well aerated. Mild central vascular congestion is seen. No bony abnormality is noted. No pneumothorax is seen. IMPRESSION: No pneumothorax following central line placement. The central line tip is in the region of the right innominate vein. Mild vascular congestion. Electronically Signed   By: Alcide Clever M.D.   On: 02/15/2023 19:28   CT ANGIO HEAD NECK W WO CM  Result Date: 02/15/2023 CLINICAL DATA:  Provided history: Transient ischemic attack. Syncope, left-sided facial droop. EXAM: CT ANGIOGRAPHY HEAD AND NECK WITH AND WITHOUT CONTRAST TECHNIQUE: Multidetector CT imaging of the head and neck was performed  using the standard protocol during bolus administration of intravenous contrast. Multiplanar CT image reconstructions and MIPs were obtained to evaluate the vascular anatomy. Carotid stenosis measurements (when applicable) are obtained utilizing NASCET criteria, using the distal internal carotid diameter as the denominator. RADIATION DOSE REDUCTION: This exam was performed according to the departmental dose-optimization program which includes automated exposure control, adjustment of the mA and/or kV according to patient size and/or  use of iterative reconstruction technique. CONTRAST:  Administered contrast not known at this time. COMPARISON:  Head CT performed earlier today 02/15/2023. FINDINGS: CTA NECK FINDINGS Aortic arch: Standard aortic branching. Mild sclerotic plaque within the visualized aortic arch. No hemodynamically significant innominate or proximal subclavian artery stenosis. Right carotid system: CCA and ICA patent within the neck without stenosis or significant atherosclerotic disease. Tortuosity of the cervical ICA. Left carotid system: CCA and ICA patent within the neck without stenosis or significant atherosclerotic disease. Tortuosity of the cervical ICA. Vertebral arteries: Patent within the neck without stenosis or significant atherosclerotic disease. The left vertebral artery is slightly dominant. Skeleton: No acute fracture. Other neck: No neck mass or cervical lymphadenopathy. Upper chest: No consolidation within the imaged lung apices. 10 mm cyst within the right upper lobe. Review of the MIP images confirms the above findings CTA HEAD FINDINGS Anterior circulation: The intracranial internal carotid arteries are patent. Nonstenotic atherosclerotic plaque within the left cavernous segment. The M1 middle cerebral arteries are patent. Abrupt occlusion of a proximal M2 right MCA vessel (series 12, image 44) (series 11, image 17). No left M2 proximal branch occlusion or high-grade proximal stenosis is identified. The anterior cerebral arteries are patent. Posterior circulation: The intracranial vertebral arteries are patent. The basilar artery is patent. The posterior cerebral arteries are patent. A right posterior communicating artery is present. The left posterior communicating artery is diminutive or absent. Venous sinuses: Within the limitations of contrast timing, no convincing thrombus. Anatomic variants: As described. Review of the MIP images confirms the above findings CTA head impression #1 called by telephone at  the time of interpretation on 02/15/2023 at 1:29 pm to provider Gloris Manchester , who verbally acknowledged these results. IMPRESSION: CTA neck: 1. The common carotid, internal carotid and vertebral arteries are patent within the neck without stenosis or significant atherosclerotic disease. 2.  Aortic Atherosclerosis (ICD10-I70.0). CTA head: 1. Abrupt occlusion of a proximal M2 right middle cerebral artery vessel. Neuro-interventional consultation recommended. 2. Mild non-stenotic atherosclerotic plaque within the cavernous left eye Electronically Signed   By: Jackey Loge D.O.   On: 02/15/2023 13:33   CT HEAD CODE STROKE WO CONTRAST  Result Date: 02/15/2023 CLINICAL DATA:  Code stroke. Neuro deficit, acute, stroke suspected. Syncope. Left-sided facial droop. EXAM: CT HEAD WITHOUT CONTRAST TECHNIQUE: Contiguous axial images were obtained from the base of the skull through the vertex without intravenous contrast. RADIATION DOSE REDUCTION: This exam was performed according to the departmental dose-optimization program which includes automated exposure control, adjustment of the mA and/or kV according to patient size and/or use of iterative reconstruction technique. COMPARISON:  Report from head CT 05/11/2001 (images unavailable) FINDINGS: Brain: Cerebral volume is normal. A small chronic lacunar infarct is questioned within the left corona radiata (for instance as seen on series 3, image 19) (series 5, image 32). There is no acute intracranial hemorrhage. No demarcated cortical infarct. No extra-axial fluid collection. No evidence of an intracranial mass. No midline shift. Vascular: No hyperdense vessel. Skull: No fracture or aggressive osseous lesion. Sinuses/Orbits: No mass or acute finding  within the imaged orbits. No significant paranasal sinus disease at the imaged levels. ASPECTS [facility] Stroke Program Early CT Score) - Ganglionic level infarction (caudate, lentiform nuclei, internal capsule, insula, M1-M3  cortex): 7 - Supraganglionic infarction (M4-M6 cortex): 3 Total score (0-10 with 10 being normal): 10 These results were communicated to Dr. Roda Shutters At 1:04 pmon 5/11/2024by text page via the [facility] messaging system. IMPRESSION: 1. No evidence of an acute intracranial abnormality. ASPECTS is 10. 2. Possible small chronic lacunar infarct within the left corona radiata. Electronically Signed   By: Jackey Loge D.O.   On: 02/15/2023 13:15    Labs:  CBC: Recent Labs    02/10/23 0104 02/10/23 1442 02/11/23 0103 02/15/23 1245 02/15/23 1251 02/15/23 1540 02/15/23 1740 02/15/23 2020 02/16/23 0646  WBC 10.4  --  8.3 11.0*  --   --   --  21.1*  --   HGB 13.5   < > 13.7 14.9   < > 13.6 15.3* 13.7 14.3  HCT 43.1   < > 43.4 46.9*   < > 40.0 45.0 44.1 42.0  PLT 291  --  269 337  --   --   --  310  --    < > = values in this interval not displayed.    COAGS: Recent Labs    02/07/23 1738 02/07/23 2001 02/15/23 1245 02/15/23 2020  INR 1.2 1.3* 1.1 1.2  APTT  --  41*  --   --     BMP: Recent Labs    02/11/23 0103 02/12/23 0040 02/15/23 1245 02/15/23 1251 02/15/23 1540 02/15/23 1740 02/15/23 2020 02/16/23 0646  NA 136 133* 134* 136 136 140 140 143  K 3.8 4.1 3.9 3.9 3.3* 3.2* 3.4* 3.4*  CL 104 101 98 103  --   --  101  --   CO2 21* 22 22  --   --   --  24  --   GLUCOSE 98 108* 134* 137*  --   --  318*  --   BUN 14 19 21* 23*  --   --  21*  --   CALCIUM 9.5 9.5 9.7  --   --   --  8.8*  --   CREATININE 0.83 0.97 1.07* 1.00  --   --  1.46*  --   GFRNONAA >60 >60 >60  --   --   --  43*  --     LIVER FUNCTION TESTS: Recent Labs    02/07/23 0947 02/15/23 1245 02/15/23 2020  BILITOT 0.9 0.5 0.5  AST 33 20 29  ALT 39 24 30  ALKPHOS 70 81 75  PROT 7.3 8.8* 8.1  ALBUMIN 3.5 3.6 3.6    Assessment and Plan:  CVA Rt MCA revascularization Doing well-- hope for extubation  if can today--- plan per Neuro Will report to Dr Quay Burow  Electronically Signed: Robet Leu,  PA-C 02/16/2023, 7:21 AM   I spent a total of 15 Minutes at the the patient's bedside AND on the patient's hospital floor or unit, greater than 50% of which was counseling/coordinating care for Rt MCA revascularization

## 2023-02-16 NOTE — Progress Notes (Signed)
RT attempted Aline 2x w/o success. RN notified

## 2023-02-16 NOTE — Progress Notes (Signed)
E-link notified that RT was unable to replace arterial line.

## 2023-02-16 NOTE — Consult Note (Signed)
NAME:  Shauniece Reilly, MRN:  [id number], DOB:  [birth year], LOS: 1 ADMISSION DATE:  02/15/2023, CONSULTATION DATE:  02/05/2023 REFERRING MD:  Stephanie Reilly of stroke service, CHIEF COMPLAINT:  code stroke -> Caridac arrest    BRIEF   Stephanie Reilly =52 year old female with past medical history of hypertension, hyperlipidemia, diabetes obesity anxiety, depression new diagnosis of systolic heart failure [facility] May, 2024 with ejection fraction 12%].  She was discharged 02/12/2023 on aspirin, torsemide Entresto Aldactone and Farxiga.  She presented acutely on 02/15/2023 with slurred speech and facial droop that resolved prior to coming to the ED and within 1 minute..  Code stroke showed occlusion of proximal right M2.  Diagnosis of acute right MCA ischemic infarct was made.  Referred to interventional radiology and patient intubated for procedure. Immediate post intubation had PEA arest with 2 min CPR and epix x 1 with ROSC. Post ROSC has needed epi gtt (reported not responding to other vasopressors].  And multiple pushes of epinephrine at least 4-5 by the time of transfer to the ICU.  While on epinephrine drip, and underwent diagnostic cerebral angiogram and mechanical thrombectomy for right M2 occlusion with right radial artery access.  She is status post complete recanalization after 3 passes.  Resultant trace subarachnoid hemorrhage in the right sylvian fissure seen.  Systolic blood pressure goal 120-160 and sent to for not ICU.  Critical care medicine consulted.  No thrombolysis given Epinephrine going to peripheral line. On propofol on the ventilator.  Past Medical History:    has a past medical history of Anemia, Anxiety, Arthritis, Depression, Hypertension, and Prediabetes.   has a past surgical history that includes Abdominal hysterectomy; Tonsillectomy; Tubal ligation; RIGHT/LEFT HEART CATH AND CORONARY ANGIOGRAPHY (N/A, 02/10/2023); and Radiology with anesthesia (N/A, 02/15/2023).   Significant  Hospital Events:  02/15/2023 - admit  Post Arrest and mechanical thrombectomy . Since then pressor dependent. S/p R IJ and IO left tibia briefly  Interim History / Subjective:   02/16/2023 - Awake and nuro intact  on ventilator. On fent gtt. Overnight Ref shock worse and still so-> levophed 14-20 and epi gtt . RT IJ CVL slightly high on innominate. Making urine. Family at bedside   Objective   Blood pressure (!) 141/66, pulse (!) 124, temperature 99.2 F (37.3 C), temperature source Axillary, resp. rate 19, height 5\' 2"  (1.575 m), weight 111.4 kg, SpO2 99 %.    Vent Mode: PSV;CPAP FiO2 (%):  [50 %-100 %] 50 % Set Rate:  [14 bmp-18 bmp] 18 bmp Vt Set:  [400 mL-540 mL] 400 mL PEEP:  [5 cmH20] 5 cmH20 Pressure Support:  [5 cmH20] 5 cmH20   Intake/Output Summary (Last 24 hours) at 02/16/2023 4098 Last data filed at 02/16/2023 0700 Gross per 24 hour  Intake 2171.83 ml  Output 825 ml  Net 1346.83 ml   Filed Weights   02/15/23 1333  Weight: 111.4 kg    General Appearance:  Looks criticall ill OBESE - +  but looks beter Head:  Normocephalic, without obvious abnormality, atraumatic Eyes:  PERRL - yes, conjunctiva/corneas - muddy     Ears:  Normal external ear canals, both ears Nose:  G tube - no Throat:  ETT TUBE - YES , OG tube - no Neck:  Supple,  No enlargement/tenderness/nodules Lungs: Clear to auscultation bilaterally, Ventilator   Synchrony - yes Heart:  S1 and S2 normal, no murmur, CVP - no.  Pressors -  LEVIPHED and EPI GTGT Abdomen:  Soft, no  masses, no organomegaly Genitalia / Rectal:  Not done Extremities:  Extremities- intact an dNOT cold Skin:  ntact in exposed areas . Sacral area - not examined Neurologic:  Sedation - fent gtt -> RASS - o . Moves all 4s - yes and equal. CAM-ICU - NEG . Orientation - x3+     Resolved Hospital Problem list   x  Assessment & Plan:     Acute respiratory failure following cardiac arrest upon induction of anesthesia for code  stroke. - Present on Admit  02/16/2023 - > does YES meet criteria for SBT and lilely also Extubation    P:   HOLD OFF EXTUBATION til seen by cardiology and pressor doses beter Full ventilator support VAP bundle    Code stroke right M2 ischemic occlusion -primary presenting problem -  Present on Admit. :  status post mechanical thrombectomy with mild residual subarachnoid hemorrhage in the sylvian fissure.  No thrombolytic given  5/12: Complelty neuro intact and following commands normally on fent gtt. REfractory shock making it hard to keep SBP gial > 120  P:   Hold off MRI brain till better control on BP Might be btter off going for MRI on ventilator SBP goal lowered 02/16/23 to > 100 and MAP > 65 (d/w neuro)    Nonischemic cardiomyopathy diagnosed May 2024 hospitalization - sarcoidosis under consideration.  Left ventricular ejection fraction 12%.  Outpatient PET scan pending.  Was on home torsemide. Sustained monomorphic ventricular tachycardia 40 beat early May 2024 in the hospital  PEA cardiac arrest -with return of spontaneous circulation after 1 round CPR. - Present on Admit Postarrest refractory circulatory shock - Present on Admit   5/12:  Refractory shock is worse. Needing 2 pressors - epi gtt and levophed gtt  P: SBP gial > 100 and MAP > 65; lowered from SBP > 120 to help get off pressors D/w DR Stephanie Reilly - cardiology; she or CHF service will see 02/16/23 Urgent PICC orddered 02/16/23 (will dc cvl after that)   AKI -onset 02/15/23 PM  5/12 - making uinr  Plan  - maintain BP/HR  - recheck BMET  Mild Lactic acidosis 02/15/23  Plan  - recheck lactate   Obesity Diabetes Hypertension Hyperlipidemia Metabolic syndrome  PLAN Sliding scale insulin        Best practice (daily eval):  Diet: N.p.o. but will need tube feeds if not extuated  Pain/Anxiety/Delirium protocol (if indicated): Fent gtt avoiding diprivan due to shock VAP protocol (if indicated):  Yes DVT prophylaxis: Low-dose Lovenox GI prophylaxis: Protonix Glucose control: SSI Mobility: Bedrest Code Status: Full code  Disposition: ICU  Family Communication:   02/15/23 Daughter Stephanie Reilly [phone] -> IPAL done  02/16/23 - 2 others female famiy members updated at bedside      ATTESTATION & SIGNATURE   The patient Stephanie Reilly is critically ill with multiple organ systems failure and requires high complexity decision making for assessment and support, frequent evaluation and titration of therapies, application of advanced monitoring technologies and extensive interpretation of multiple databases and discussion with other appropriate health care personnel such as bedside nurses, social workers, case Production designer, theatre/television/film, consultants, respiratory therapists, nutritionists, secretaries etc.,  Critical care time includes but is not restricted to just documentation time. Documentation can happen in parallel or sequential to care time depending on case mix urgency and priorities for the shift. So, overall critical Care Time devoted to patient care services described in this note is  40  Minutes.  This time reflects time of care of this signee Dr Kalman Shan which includ does not reflect procedure time, or teaching time or supervisory time of PA/NP/Med student/Med Resident etc but could involve care discussion time     Dr. Kalman Shan, M.D., [facility].C.P Pulmonary and Critical Care Medicine Medical Director - [facility] ICU Staff Physician, [facility] Pulmonary and Critical Care Pager: [phone], If no answer or between  15:00h - 7:00h: call [phone]  02/16/2023 9:00 AM    LABS    PULMONARY Recent Labs  Lab 02/10/23 1442 02/10/23 1450 02/10/23 1451 02/15/23 1251 02/15/23 1540 02/15/23 1740 02/16/23 0646  PHART 7.460*  --   --   --  7.277* 7.253* 7.378  PCO2ART 32.6  --   --   --  49.2* 50.7* 38.8  PO2ART 71*  --   --   --  120*  96 119*  HCO3 23.2 25.2 25.5  --  23.0 22.4 22.7  TCO2 24 26 27 23 24 24 24   O2SAT 95 63 63  --  98 96 99    CBC Recent Labs  Lab 02/11/23 0103 02/15/23 1245 02/15/23 1251 02/15/23 1740 02/15/23 2020 02/16/23 0646  HGB 13.7 14.9   < > 15.3* 13.7 14.3  HCT 43.4 46.9*   < > 45.0 44.1 42.0  WBC 8.3 11.0*  --   --  21.1*  --   PLT 269 337  --   --  310  --    < > = values in this interval not displayed.    COAGULATION Recent Labs  Lab 02/15/23 1245 02/15/23 2020  INR 1.1 1.2    CARDIAC  No results for input(s): "TROPONINI" in the last 168 hours. No results for input(s): "PROBNP" in the last 168 hours.  CHEMISTRY Recent Labs  Lab 02/10/23 0104 02/10/23 0259 02/10/23 1442 02/11/23 0103 02/12/23 0040 02/15/23 1245 02/15/23 1251 02/15/23 1540 02/15/23 1740 02/15/23 2020 02/16/23 0646  NA 138  --    < > 136 133* 134* 136 136 140 140 143  K 4.1  --    < > 3.8 4.1 3.9 3.9 3.3* 3.2* 3.4* 3.4*  CL 105  --   --  104 101 98 103  --   --  101  --   CO2 22  --   --  21* 22 22  --   --   --  24  --   GLUCOSE 112*  --   --  98 108* 134* 137*  --   --  318*  --   BUN 13  --   --  14 19 21* 23*  --   --  21*  --   CREATININE 0.89  --   --  0.83 0.97 1.07* 1.00  --   --  1.46*  --   CALCIUM 9.3  --   --  9.5 9.5 9.7  --   --   --  8.8*  --   MG  --  2.2  --  2.2  --  2.5*  --   --   --   --   --    < > = values in this interval not displayed.   Estimated Creatinine Clearance: 52.5 mL/min (A) (by C-G formula based on SCr of 1.46 mg/dL (H)).   LIVER Recent Labs  Lab 02/15/23 1245 02/15/23 2020  AST 20 29  ALT 24 30  ALKPHOS  81 75  BILITOT 0.5 0.5  PROT 8.8* 8.1  ALBUMIN 3.6 3.6  INR 1.1 1.2     INFECTIOUS Recent Labs  Lab 02/15/23 2018  LATICACIDVEN 2.9*     ENDOCRINE CBG (last 3)  Recent Labs    02/15/23 2006 02/15/23 2325 02/16/23 0310  GLUCAP 318* 238* 207*         IMAGING x48h  - image(s) personally visualized  -   highlighted in  bold [place] EKG SITE RITE  Result Date: 02/16/2023 If Site Rite image not attached, placement could not be confirmed due to current cardiac rhythm.  DG CHEST PORT 1 VIEW  Result Date: 02/15/2023 CLINICAL DATA:  Check central line placement EXAM: PORTABLE CHEST 1 VIEW COMPARISON:  02/07/2023 FINDINGS: New right jugular central line is noted with the catheter tip in the region of the right innominate vein. Endotracheal tube is noted in satisfactory position. Cardiac shadow is enlarged but stable. The lungs are well aerated. Mild central vascular congestion is seen. No bony abnormality is noted. No pneumothorax is seen. IMPRESSION: No pneumothorax following central line placement. The central line tip is in the region of the right innominate vein. Mild vascular congestion. Electronically Signed   By: Alcide Clever M.D.   On: 02/15/2023 19:28   CT ANGIO HEAD NECK W WO CM  Result Date: 02/15/2023 CLINICAL DATA:  Provided history: Transient ischemic attack. Syncope, left-sided facial droop. EXAM: CT ANGIOGRAPHY HEAD AND NECK WITH AND WITHOUT CONTRAST TECHNIQUE: Multidetector CT imaging of the head and neck was performed using the standard protocol during bolus administration of intravenous contrast. Multiplanar CT image reconstructions and MIPs were obtained to evaluate the vascular anatomy. Carotid stenosis measurements (when applicable) are obtained utilizing NASCET criteria, using the distal internal carotid diameter as the denominator. RADIATION DOSE REDUCTION: This exam was performed according to the departmental dose-optimization program which includes automated exposure control, adjustment of the mA and/or kV according to patient size and/or use of iterative reconstruction technique. CONTRAST:  Administered contrast not known at this time. COMPARISON:  Head CT performed earlier today 02/15/2023. FINDINGS: CTA NECK FINDINGS Aortic arch: Standard aortic branching. Mild sclerotic plaque within the visualized  aortic arch. No hemodynamically significant innominate or proximal subclavian artery stenosis. Right carotid system: CCA and ICA patent within the neck without stenosis or significant atherosclerotic disease. Tortuosity of the cervical ICA. Left carotid system: CCA and ICA patent within the neck without stenosis or significant atherosclerotic disease. Tortuosity of the cervical ICA. Vertebral arteries: Patent within the neck without stenosis or significant atherosclerotic disease. The left vertebral artery is slightly dominant. Skeleton: No acute fracture. Other neck: No neck mass or cervical lymphadenopathy. Upper chest: No consolidation within the imaged lung apices. 10 mm cyst within the right upper lobe. Review of the MIP images confirms the above findings CTA HEAD FINDINGS Anterior circulation: The intracranial internal carotid arteries are patent. Nonstenotic atherosclerotic plaque within the left cavernous segment. The M1 middle cerebral arteries are patent. Abrupt occlusion of a proximal M2 right MCA vessel (series 12, image 44) (series 11, image 17). No left M2 proximal branch occlusion or high-grade proximal stenosis is identified. The anterior cerebral arteries are patent. Posterior circulation: The intracranial vertebral arteries are patent. The basilar artery is patent. The posterior cerebral arteries are patent. A right posterior communicating artery is present. The left posterior communicating artery is diminutive or absent. Venous sinuses: Within the limitations of contrast timing, no convincing thrombus. Anatomic variants: As described. Review of the MIP  images confirms the above findings CTA head impression #1 called by telephone at the time of interpretation on 02/15/2023 at 1:29 pm to provider Gloris Manchester , who verbally acknowledged these results. IMPRESSION: CTA neck: 1. The common carotid, internal carotid and vertebral arteries are patent within the neck without stenosis or significant  atherosclerotic disease. 2.  Aortic Atherosclerosis (ICD10-I70.0). CTA head: 1. Abrupt occlusion of a proximal M2 right middle cerebral artery vessel. Neuro-interventional consultation recommended. 2. Mild non-stenotic atherosclerotic plaque within the cavernous left eye Electronically Signed   By: Jackey Loge D.O.   On: 02/15/2023 13:33   CT HEAD CODE STROKE WO CONTRAST  Result Date: 02/15/2023 CLINICAL DATA:  Code stroke. Neuro deficit, acute, stroke suspected. Syncope. Left-sided facial droop. EXAM: CT HEAD WITHOUT CONTRAST TECHNIQUE: Contiguous axial images were obtained from the base of the skull through the vertex without intravenous contrast. RADIATION DOSE REDUCTION: This exam was performed according to the departmental dose-optimization program which includes automated exposure control, adjustment of the mA and/or kV according to patient size and/or use of iterative reconstruction technique. COMPARISON:  Report from head CT 05/11/2001 (images unavailable) FINDINGS: Brain: Cerebral volume is normal. A small chronic lacunar infarct is questioned within the left corona radiata (for instance as seen on series 3, image 19) (series 5, image 32). There is no acute intracranial hemorrhage. No demarcated cortical infarct. No extra-axial fluid collection. No evidence of an intracranial mass. No midline shift. Vascular: No hyperdense vessel. Skull: No fracture or aggressive osseous lesion. Sinuses/Orbits: No mass or acute finding within the imaged orbits. No significant paranasal sinus disease at the imaged levels. ASPECTS [facility] Stroke Program Early CT Score) - Ganglionic level infarction (caudate, lentiform nuclei, internal capsule, insula, M1-M3 cortex): 7 - Supraganglionic infarction (M4-M6 cortex): 3 Total score (0-10 with 10 being normal): 10 These results were communicated to Dr. Roda Shutters At 1:04 pmon 5/11/2024by text page via the [facility] messaging system. IMPRESSION: 1. No evidence of an acute intracranial  abnormality. ASPECTS is 10. 2. Possible small chronic lacunar infarct within the left corona radiata. Electronically Signed   By: Jackey Loge D.O.   On: 02/15/2023 13:15

## 2023-02-16 NOTE — Progress Notes (Signed)
RT attempted to replace arterial line due to being unable to draw back on line. 2 attempts made, RN made aware.

## 2023-02-16 NOTE — Progress Notes (Signed)
Communicated with Dr. Derry Lory. Per MD, okay to defer MRI this shift due to patient stability and need for multiple pressors.

## 2023-02-16 NOTE — Progress Notes (Signed)
eLink Physician-Brief Progress Note Patient Name: Stephanie Reilly DOB: January 25, 1971 MRN: [id number]   Date of Service  02/16/2023  HPI/Events of Note  BSRN reached out to IR to clarify SBP goal, SBP > 100. Currently maxed on Epi 20, current BP 95/65.  eICU Interventions  Ordered to start norepinephrine drip to be given via central access     Intervention Category Intermediate Interventions: Hypotension - evaluation and management  [name] 02/16/2023, 1:51 AM

## 2023-02-16 NOTE — Progress Notes (Addendum)
STROKE TEAM PROGRESS NOTE   INTERVAL HISTORY Patient is seen in her room with 2 family members at the bedside.  Overnight, she has required 2 pressors to maintain MAP goals.  She remains intubated, but her neurological exam is good with patient able to follow commands and communicate by nodding and gesturing.  Hope to wean pressors today and extubate soon.  Yesterday, she came to the ED with acute onset left facial droop and slurred speech.  Symptoms resolved, but patient was found to have right M2 occlusion on CTA.  After patient had recurrent symptoms, she was taken to IR for mechanical thrombectomy, which was successful.  However, patient coded upon induction with ROSC achieved after 2 minutes.  She remains in the ICU on multiple pressors.  Vitals:   02/16/23 1030 02/16/23 1100 02/16/23 1200 02/16/23 1300  BP: 112/86 115/67 (!) 117/56 (!) 108/52  Pulse: (!) 113 (!) 120 (!) 111 (!) 107  Resp: 19 20 20 17   Temp:      TempSrc:      SpO2: 98% 97% 97% 96%  Weight:      Height:       CBC:  Recent Labs  Lab 02/15/23 1245 02/15/23 1251 02/15/23 2020 02/16/23 0646 02/16/23 0945  WBC 11.0*  --  21.1*  --  17.6*  NEUTROABS 6.7  --   --   --   --   HGB 14.9   < > 13.7 14.3 12.6  HCT 46.9*   < > 44.1 42.0 40.2  MCV 82.4  --  84.8  --  85.4  PLT 337  --  310  --  246   < > = values in this interval not displayed.   Basic Metabolic Panel:  Recent Labs  Lab 02/15/23 1245 02/15/23 1251 02/15/23 2020 02/16/23 0646 02/16/23 0945  NA 134*   < > 140 143 140  K 3.9   < > 3.4* 3.4* 3.3*  CL 98   < > 101  --  102  CO2 22  --  24  --  25  GLUCOSE 134*   < > 318*  --  181*  BUN 21*   < > 21*  --  20  CREATININE 1.07*   < > 1.46*  --  1.17*  CALCIUM 9.7  --  8.8*  --  9.6  MG 2.5*  --   --   --  2.3  PHOS  --   --   --   --  3.0   < > = values in this interval not displayed.   Lipid Panel:  Recent Labs  Lab 02/16/23 0945  CHOL 155  TRIG 62  HDL 45  CHOLHDL 3.4  VLDL 12  LDLCALC  98   HgbA1c:  Recent Labs  Lab 02/15/23 2018  HGBA1C 6.1*   Urine Drug Screen:  Recent Labs  Lab 02/15/23 1227  LABOPIA NONE DETECTED  COCAINSCRNUR NONE DETECTED  LABBENZ NONE DETECTED  AMPHETMU NONE DETECTED  THCU NONE DETECTED  LABBARB NONE DETECTED    Alcohol Level  Recent Labs  Lab 02/15/23 1245  ETH <10    IMAGING past 24 hours DG CHEST PORT 1 VIEW  Result Date: 02/16/2023 CLINICAL DATA:  ETT EXAM: PORTABLE CHEST 1 VIEW COMPARISON:  Feb 15, 2023 FINDINGS: The ETT is in good position. No pneumothorax. The lungs remain clear. Stable cardiomegaly. The hila and mediastinum are unchanged. No other acute abnormalities are identified. IMPRESSION: The ETT is in good  position. No other interval changes. Electronically Signed   By: Gerome Sam III M.D.   On: 02/16/2023 08:56   [place] EKG SITE RITE  Result Date: 02/16/2023 If Site Rite image not attached, placement could not be confirmed due to current cardiac rhythm.  DG CHEST PORT 1 VIEW  Result Date: 02/15/2023 CLINICAL DATA:  Check central line placement EXAM: PORTABLE CHEST 1 VIEW COMPARISON:  02/07/2023 FINDINGS: New right jugular central line is noted with the catheter tip in the region of the right innominate vein. Endotracheal tube is noted in satisfactory position. Cardiac shadow is enlarged but stable. The lungs are well aerated. Mild central vascular congestion is seen. No bony abnormality is noted. No pneumothorax is seen. IMPRESSION: No pneumothorax following central line placement. The central line tip is in the region of the right innominate vein. Mild vascular congestion. Electronically Signed   By: Alcide Clever M.D.   On: 02/15/2023 19:28    PHYSICAL EXAM General: Intubated, well-developed, well-nourished patient in no acute distress Respiratory: Respirations synchronous with ventilator Neurological: Patient is alert and looks around the room and others, she is able to communicate with gestures and by nodding  appropriately.  Extraocular movements are intact, face appears symmetrical with ET tube in place, and patient is able to follow commands with all 4 extremities.  ASSESSMENT/PLAN [name] is a 52 y.o. female with history of hypertension, hyperlipidemia, anxiety, depression, CHF, diabetes and morbid obesity presenting with acute onset left facial droop and slurred speech.  Symptoms resolved, but patient was found to have right M2 occlusion on CTA.  After patient had recurrent symptoms, she was taken to IR for mechanical thrombectomy, which was successful.  However, patient coded upon induction with ROSC achieved after 2 minutes.  She remains in the ICU on multiple pressors.  MRI is pending.  Hope to wean pressors today and extubate patient soon.  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 ASPECTS 10.    CTA head & neck occlusion of proximal right M2 Post IR with TICI3 reperfusion CT contrast vs. SAH in right sylvian fissure MRI pending MRA pending 2D Echo EF < 20% LDL 98 HgbA1c 6.1 VTE prophylaxis -Lovenox aspirin 81 mg daily prior to admission, now on ASA 325. Will consider anticoagulation once more stable. Therapy recommendations: Pending Disposition: Pending  History of hypertension, now hypotensive Home meds: metoprolol, entresto, spironolactone, torsemide Unstable, low requiring epinephrine and norepinephrine BP goal MAP of 65 or greater and SBP goal of 100 or greater Long-term BP goal normotensive  Respiratory failure Patient left intubated after procedure as she coded upon induction Ventilator management per CCM Hope to extubate patient after pressors are weaned  Cardiomyopathy  PEA arrest on anesthesia induction Recently diagnosed with CHF On metoprolol, entresto, spironolactone, torsemide EF < 20% on echo, and EF 12% on MRI Avoid fluid overload.   Cardiology on board plan for anticoagulation for the next 3-48m with apixaban 5mg  BID.   Hyperlipidemia Home meds: None LDL 98, goal < 70 Add crestor 20 Continue statin at discharge  Other Stroke Risk Factors Former cigarette smoker Obesity, Body mass index is 44.92 kg/m., BMI >/= 30 associated with increased stroke risk, recommend weight loss, diet and exercise as appropriate   Other Active Problems Leukocytosis WBC 17.6 Hypokalemia, supplement AKI 1.46->1.17  Hospital day # 1  Patient seen by NP and then  by MD, MD to edit note is needed  Cortney E Ernestina Columbia , MSN, AGACNP-BC [facility] See Amion for schedule and pager information 02/16/2023 2:04 PM   ATTENDING NOTE: I reviewed above note and agree with the assessment and plan. Pt was seen and examined.   Daughter at the bedside. Pt still intubated, but eyes open, awake alert, eyes tracking bilaterally, following all simple commands, moving all extremities equally. However, her BP was low, on two pressors and now relax BP goal to SBP > 100 and MAP > 65, trying to wean off pressors. CCM on board trying to wean off vent. Cardiology on board, agree with anticoagulation once stable. Appreciate help. Now put on ASA and statin. Continue ICU care.   For detailed assessment and plan, please refer to above/below as I have made changes wherever appropriate.   Marvel Plan, MD PhD Stroke Neurology 02/16/2023 9:48 PM  This patient is critically ill due to server CHF, PEA arrest, left MCA stroke, s/p thrombectomy, AKI and at significant risk of neurological worsening, death form cardiac arrest, recurrent stroke hemorrhagic conversion, renal failure. This patient's care requires constant monitoring of vital signs, hemodynamics, respiratory and cardiac monitoring, review of multiple databases, neurological assessment, discussion with family, other specialists and medical decision making of high complexity. I spent 40 minutes of  neurocritical care time in the care of this patient. I had long discussion with daughters at bedside, updated pt current condition, treatment plan and potential prognosis, and answered all the questions. They expressed understanding and appreciation.      To contact Stroke Continuity provider, please refer to [email or web address]. After hours, contact General Neurology

## 2023-02-16 NOTE — Progress Notes (Signed)
SLP Cancellation Note  Patient Details Name: Stephanie Reilly MRN: [id number] DOB: 05-27-1971   Cancelled treatment:       Reason Eval/Treat Not Completed: Patient not medically ready (Pt currently on the vent. SLP will follow up on a subsequent date.)  Celicia Minahan I. Vear Clock, MS, CCC-SLP Acute Rehabilitation Services Office number [phone]  Scheryl Marten 02/16/2023, 7:56 AM

## 2023-02-16 NOTE — Progress Notes (Signed)
RN notified phlebotomy that arterial line is no longer giving blood return, so they will need to draw morning labs. E-link notified that arterial line is not giving blood return, and appears to be dampened when performing square wave test. RN attempted to trouble shoot arterial line, with no change in blood return. RN notified respiratory therapy in attempt to see if they are able to trouble shoot arterial line and get blood return.

## 2023-02-16 NOTE — Progress Notes (Signed)
Peripherally Inserted Central Catheter Placement  The IV Nurse has discussed with the patient and/or persons authorized to consent for the patient, the purpose of this procedure and the potential benefits and risks involved with this procedure.  The benefits include less needle sticks, lab draws from the catheter, and the patient may be discharged home with the catheter. Risks include, but not limited to, infection, bleeding, blood clot (thrombus formation), and puncture of an artery; nerve damage and irregular heartbeat and possibility to perform a PICC exchange if needed/ordered by physician.  Alternatives to this procedure were also discussed.  Bard Power PICC patient education guide, fact sheet on infection prevention and patient information card has been provided to patient /or left at bedside. Consent obtained from daughter, Inez Pilgrim at bedside.   PICC Placement Documentation  PICC Triple Lumen 02/16/23 Right Basilic 40 cm 0 cm (Active)  Indication for Insertion or Continuance of Line Vasoactive infusions 02/16/23 1108  Exposed Catheter (cm) 0 cm 02/16/23 1108  Site Assessment Clean, Dry, Intact 02/16/23 1108  Lumen #1 Status Saline locked;Flushed;Blood return noted 02/16/23 1108  Lumen #2 Status Saline locked;Flushed;Blood return noted 02/16/23 1108  Lumen #3 Status Saline locked;Flushed;Blood return noted 02/16/23 1108  Dressing Type Transparent;Securing device 02/16/23 1108  Dressing Status Antimicrobial disc in place;Clean, Dry, Intact 02/16/23 1108  Safety Lock Not Applicable 02/16/23 1108  Line Care Connections checked and tightened 02/16/23 1108  Line Adjustment (NICU/IV Team Only) No 02/16/23 1108  Dressing Intervention New dressing 02/16/23 1108  Dressing Change Due 02/23/23 02/16/23 1108       Burnard Bunting Chenice 02/16/2023, 11:09 AM

## 2023-02-16 NOTE — Consult Note (Signed)
ADVANCED HEART FAILURE CONSULT NOTE  Referring Physician: No ref. provider found  Primary Care: [facility] Primary HF: Dr. Gasper Lloyd  HPI: Stephanie Reilly is a 52 y.o. female with hypertension, hyperlipidemia, type 2 diabetes, obesity and recently diagnosed systolic heart failure currently admitted after presenting with acute slurred speech and facial droop on Feb 15, 2023.  On arrival code stroke was called with imaging demonstrating occlusion of the proximal right M2.  She was sent to interventional radiology and intubated.  Immediately postintubation she had PEA arrest requiring 2 minutes of CPR and epinephrine prior to ROSC.  Since that time she has undergone diagnostic cerebral angiogram and thrombectomy for the right M2 occlusion now status post complete recanalization.  Her cardiac history dates back to 02/07/2023 when she presented to the emergency department with chest pain and shortness of breath that started in March 2024.  During that admission patient had an echocardiogram that demonstrated EF of 20% with global hypokinesis but preserved RV function.  She underwent left heart cath which was nonsignificant and right heart cath that demonstrated preserved cardiac index.  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  She was discharged home on Entresto, Farxiga, and spironolactone and Toprol.  At discharge her serum creatinine was less than 1.  Today it is up to 1.46 after arrest.  Lactic acid of 2.9 yesterday evening.  EKG with sinus tachycardia  Currently intubated/sedated on moderate to high doses of epinephrine & levophed.   Past Medical History:  Diagnosis Date   Anemia    Anxiety    Arthritis    Depression    Hypertension    Prediabetes     Current Facility-Administered Medications  Medication Dose Route Frequency Provider Last Rate Last Admin   0.9 %  sodium chloride infusion   Intravenous PRN  [name], MD   Paused at 02/16/23 0138   acetaminophen (TYLENOL) tablet 650 mg  650 mg Oral Q4H PRN Mathews Argyle, NP       Or   acetaminophen (TYLENOL) 160 MG/5ML solution 650 mg  650 mg Per Tube Q4H PRN Mathews Argyle, NP       Or   acetaminophen (TYLENOL) suppository 650 mg  650 mg Rectal Q4H PRN Mathews Argyle, NP       Chlorhexidine Gluconate Cloth 2 % PADS 6 each  6 each Topical Daily Erick Blinks, MD   6 each at 02/16/23 0917   docusate (COLACE) 50 MG/5ML liquid 100 mg  100 mg Per Tube BID Kalman Shan, MD       enoxaparin (LOVENOX) injection 40 mg  40 mg Subcutaneous Q24H Gevena Mart A, NP       EPINEPHrine (ADRENALIN) 1 MG/10ML injection 1 mg  1 mg Intravenous Once Kalman Shan, MD       EPINEPHrine (ADRENALIN) 5 mg in NS 250 mL (0.02 mg/mL) premix infusion  0.5-20 mcg/min Intravenous Titrated de Melchor Amour, Jerilynn Mages, MD 54 mL/hr at 02/16/23 1000 18 mcg/min at 02/16/23 1000   fentaNYL (SUBLIMAZE) bolus via infusion 50-100 mcg  50-100 mcg Intravenous Q15 min PRN Kalman Shan, MD   25 mcg at 02/15/23 2121   fentaNYL (SUBLIMAZE) injection 50 mcg  50 mcg Intravenous Q15 min PRN Kalman Shan, MD       fentaNYL (SUBLIMAZE) injection 50 mcg  50 mcg Intravenous Once Kalman Shan, MD       fentaNYL (SUBLIMAZE) injection  50-200 mcg  50-200 mcg Intravenous Q30 min PRN Kalman Shan, MD       fentaNYL (SUBLIMAZE) injection 50-200 mcg  50-200 mcg Intravenous Q2H PRN Kalman Shan, MD       fentaNYL in NS (73mcg/ml) infusion-PREMIX  50-200 mcg/hr Intravenous Continuous Kalman Shan, MD 5 mL/hr at 02/16/23 1000 50 mcg/hr at 02/16/23 1000   insulin aspart (novoLOG) injection 0-15 Units  0-15 Units Subcutaneous Q4H Kalman Shan, MD   5 Units at 02/16/23 0941   midazolam (VERSED) injection 1-4 mg  1-4 mg Intravenous Q2H PRN Kalman Shan, MD       norepinephrine (LEVOPHED) 4mg  in (0.016 mg/mL) premix infusion   0-40 mcg/min Intravenous Titrated Jeannette Corpus T, MD 52.5 mL/hr at 02/16/23 1000 14 mcg/min at 02/16/23 1000   Oral care mouth rinse  15 mL Mouth Rinse Rosine Beat, MD   15 mL at 02/16/23 0918   Oral care mouth rinse  15 mL Mouth Rinse PRN Marvel Plan, MD       pantoprazole (PROTONIX) injection 40 mg  40 mg Intravenous Q24H Kalman Shan, MD   40 mg at 02/15/23 2139   polyethylene glycol (MIRALAX / GLYCOLAX) packet 17 g  17 g Per Tube Daily Kalman Shan, MD       potassium chloride 10 mEq in 100 mL IVPB  10 mEq Intravenous Q1 Hr x 3 de Saintclair Halsted, [place] E, NP 100 mL/hr at 02/16/23 0923 10 mEq at 02/16/23 1610   senna-docusate (Senokot-S) tablet 1 tablet  1 tablet Oral QHS PRN Gevena Mart A, NP       sodium chloride 0.9 % bolus 500 mL  500 mL Intravenous STAT Marvel Plan, MD        Allergies  Allergen Reactions   Paroxetine Hcl Anaphylaxis   Prednisone Shortness Of Breath and Other (See Comments)    Confusion, "messes with mental," muscles feel weird, fatigue, insomnia   Chocolate    Ibuprofen    Iron Other (See Comments)    GI upset (reported 08/04/2017). Pt clarifies had constipation, nausea.  Stool softeners did not help.    Latex    Lisinopril Hives   Naproxen    Tramadol Hives   Tylenol [Acetaminophen] Other (See Comments)    With Codeine; GI    Penicillins Rash   Serotonin Nausea Only, Swelling, Anxiety, Rash and Other (See Comments)    GI intolerence      Social History   Socioeconomic History   Marital status: Single    Spouse name: Not on file   Number of children: 3   Years of education: Not on file   Highest education level: Bachelor's degree (e.g., BA, AB, BS)  Occupational History   Occupation: self employed  Tobacco Use   Smoking status: Former    Packs/day: 0.50    Years: 11.00    Additional pack years: 0.00    Total pack years: 5.50    Types: Cigarettes    Quit date: 10/2021    Years since quitting: 1.3   Smokeless tobacco: Never   Vaping Use   Vaping Use: Never used  Substance and Sexual Activity   Alcohol use: Never   Drug use: Yes    Types: Marijuana    Comment: routine use of CBD edibles   Sexual activity: Not on file  Other Topics Concern   Not on file  Social History Narrative   Not on file   Social Determinants of Health  Financial Resource Strain: Low Risk  (02/11/2023)   Overall Financial Resource Strain (CARDIA)    Difficulty of Paying Living Expenses: Not hard at all  Food Insecurity: No Food Insecurity (02/15/2023)   Hunger Vital Sign    Worried About Running Out of Food in the Last Year: Never true    Ran Out of Food in the Last Year: Never true  Transportation Needs: No Transportation Needs (02/15/2023)   PRAPARE - Administrator, Civil Service (Medical): No    Lack of Transportation (Non-Medical): No  Physical Activity: Not on file  Stress: Not on file  Social Connections: Not on file  Intimate Partner Violence: Not At Risk (02/15/2023)   Humiliation, Afraid, Rape, and Kick questionnaire    Fear of Current or Ex-Partner: No    Emotionally Abused: No    Physically Abused: No    Sexually Abused: No      Family History  Problem Relation Age of Onset   Hypertension Mother    Heart failure Mother    COPD Mother    Diabetes Paternal Grandfather     PHYSICAL EXAM: Vitals:   02/16/23 1020 02/16/23 1030  BP: 115/72 112/86  Pulse: (!) 116 (!) 113  Resp: 18 19  Temp:    SpO2: 94% 98%   GENERAL: awake/alert HEENT: Negative for arcus senilis or xanthelasma. There is no scleral icterus.  The mucous membranes are pink and moist.   NECK: Supple, No masses. Normal carotid upstrokes without bruits. No masses or thyromegaly.    CHEST: There are no chest wall deformities. There is no chest wall tenderness. Respirations are unlabored.  Lungs- mechanical lung sounds CARDIAC:  JVP: 8-9, difficult to assess         Tachycardic, normal s1s2 No murmurs, rubs or gallops.  Pulses are 2+  and symmetrical in upper and lower extremities. no edema.  ABDOMEN: Soft, non-tender, non-distended. There are no masses or hepatomegaly. There are normal bowel sounds.  EXTREMITIES: Warm and well perfused with no cyanosis, clubbing.  LYMPHATIC: No axillary or supraclavicular lymphadenopathy.  NEUROLOGIC: awake/alert, moving all extremites PSYCH: intubated SKIN: Warm and dry; no lesions or wounds.   DATA REVIEW  ECG: 02/15/23: sinus tachycardia  As per my personal interpretation  ECHO: 02/07/23: LVEF<20%, normal RV function.  As per my personal interpretation  CATH: 02/10/23:    LV end diastolic pressure is mildly elevated.   Hemodynamic findings consistent with mild pulmonary hypertension.   Normal coronary anatomy Mild LV filling pressures. PCWP 23/21 with mean 20 mm Hg. LVEDP 21 mm Hg Mild pulmonary HTN PAP 42/22 with mean 30 mm Hg Cardiac output 4.81 L/min with index 2.26.  CMR: 02/11/23:  1. Severe LV dilatation, normal wall thickness, and severe systolic dysfunction (EF 12%)  2.  Normal RV size with moderate systolic dysfunction (EF 31%)  3. Subendocardial LGE in focal region of mid inferior wall. Subendocardial LGE is typically ischemic in etiology and this could represent a small infarct. However, sarcoidosis can also cause subendocardial LGE. Recommend cardiac FDG PET to evaluate for sarcoid.  4. RV insertion site LGE, which is a nonspecific scar pattern often seen in setting of elevated pulmonary pressures   ASSESSMENT & PLAN:  PEA arrest w/ recently diagnosed systolic heart failure - PEA arrest likely after induction for intubation - TTE from prior admission with preserved RV function, LVEF 15% - On exam today, she does not appear overtly volume overloaded. On moderate doses of levophed and epinephrine. Would  place arterial line for closer hemodynamic monitoring.  - Start CVP monitoring, obtain CO-OX from PICC line.  - Start weaning epinephrine as able; target MAP  65 - Repeat TTE post arrest - Repeat lactic acid - Will continue to follow along - Would more forward with extubation today.  2. Right M2 stroke - s/p mechanical thrombectomy with small SAH - Due to severity of LV dysfunction; although, no LV thrombus was identified on CMR would plan for anticoagulation for the next 3-62m with apixaban 5mg  BID.  3. Acute Renal failure - Place PICC line - Start CVP monitoring - Will adjust diuretics accordingly; AKI 2/2 ATN after cardiac arrest    Dwight Adamczak Advanced Heart Failure Mechanical Circulatory Support  CRITICAL CARE Performed by: Dorthula Nettles   Total critical care time: 45 minutes  Critical care time was exclusive of separately billable procedures and treating other patients.  Critical care was necessary to treat or prevent imminent or life-threatening deterioration.  Critical care was time spent personally by me on the following activities: development of treatment plan with patient and/or surrogate as well as nursing, discussions with consultants, evaluation of patient's response to treatment, examination of patient, obtaining history from patient or surrogate, ordering and performing treatments and interventions, ordering and review of laboratory studies, ordering and review of radiographic studies, pulse oximetry and re-evaluation of patient's condition.

## 2023-02-16 NOTE — Progress Notes (Signed)
   Repat rounds  - cards notes reviwed - examined patient   - doing well   - weaning slowly on pressors  - picc line +  - cvl removed  - making urine  - creat btter  - doing sbt, feels good about exubation, lifted head off bed. Good neuro status  - coox 80% and cvp 11  Recent Labs  Lab 02/15/23 1245 02/15/23 1251 02/15/23 2020 02/16/23 0646 02/16/23 0945  HGB 14.9   < > 13.7 14.3 12.6  HCT 46.9*   < > 44.1 42.0 40.2  WBC 11.0*  --  21.1*  --  17.6*  PLT 337  --  310  --  246   < > = values in this interval not displayed.   Recent Labs  Lab 02/10/23 0259 02/10/23 1442 02/11/23 0103 02/12/23 0040 02/15/23 1245 02/15/23 1251 02/15/23 1540 02/15/23 1740 02/15/23 2020 02/16/23 0646 02/16/23 0945  NA  --    < > 136 133* 134* 136 136 140 140 143 140  K  --    < > 3.8 4.1 3.9 3.9 3.3* 3.2* 3.4* 3.4* 3.3*  CL  --   --  104 101 98 103  --   --  101  --  102  CO2  --   --  21* 22 22  --   --   --  24  --  25  GLUCOSE  --   --  98 108* 134* 137*  --   --  318*  --  181*  BUN  --   --  14 19 21* 23*  --   --  21*  --  20  CREATININE  --   --  0.83 0.97 1.07* 1.00  --   --  1.46*  --  1.17*  CALCIUM  --   --  9.5 9.5 9.7  --   --   --  8.8*  --  9.6  MG 2.2  --  2.2  --  2.5*  --   --   --   --   --  2.3  PHOS  --   --   --   --   --   --   --   --   --   --  3.0   < > = values in this interval not displayed.     Plan  - extubate   30 min additional ccm time     SIGNATURE    Dr. Kalman Shan, M.D., F.C.C.P,  Pulmonary and Critical Care Medicine Staff Physician, [facility] Director - Interstitial Lung Disease  Program  Pulmonary Fibrosis [facility] at [facility] [place], [place], [address]   Pager: [phone], If no answer  -> Check AMION or Try [phone] Telephone (clinical office): [phone] Telephone (research): [phone]  2:25 PM 02/16/2023

## 2023-02-16 NOTE — Progress Notes (Addendum)
eLink Physician-Brief Progress Note Patient Name: Stephanie Reilly DOB: Feb 20, 1971 MRN: [id number]   Date of Service  02/16/2023  HPI/Events of Note  Unable to draw back on A-line with epi at 20, levo 18,   RT being called to assess situation but would like order to replace a-line if needed  UO 30 cc/hr  eICU Interventions  Ordered ABG with replacement of aline if needed CBC added to AM labs. CMP still pending at this time Ordered a trial of albumin 25% 12.5 g Discussed with bedside RN     Intervention Category Intermediate Interventions: Hypotension - evaluation and management  [name] 02/16/2023, 5:43 AM

## 2023-02-16 NOTE — Procedures (Signed)
Extubation Procedure Note  Patient Details:   Name: Stephanie Reilly DOB: 1971-06-23 MRN: [id number]   Airway Documentation:    Vent end date: (not recorded) Vent end time: (not recorded)   Evaluation  O2 sats: stable throughout Complications: No apparent complications Patient did tolerate procedure well. Bilateral Breath Sounds: Clear, Diminished   Yes  Pt extubate per order to 4L [place]. Pt had a positive cuff leak, able to state name and no stridor was noted. RT Will continue to monitor as needed.   Kerri Perches 02/16/2023, 4:09 PM

## 2023-02-16 NOTE — Progress Notes (Signed)
RN reached out to Neuro MD to verify they are okay with current BP goals of SBP>90, MAP>65. Neuro MD wished RN to contact neuro IR MD. RN contacted Dr. Tommie Sams. Dr. Joana Reamer Rodriques wished for SBP goal to be 100 or greater.

## 2023-02-16 NOTE — Progress Notes (Signed)
PT Cancellation Note  Patient Details Name: Stephanie Reilly MRN: [id number] DOB: 1971/01/28   Cancelled Treatment:    Reason Eval/Treat Not Completed: Patient not medically ready  Patient remains on ventilator, sedated with need for higher levels of pressors to maintain BP. Will follow and proceed with evaluation as appropriate.    Jerolyn Center, PT Acute Rehabilitation Services  Office [phone]   Zena Amos 02/16/2023, 7:25 AM

## 2023-02-17 ENCOUNTER — Inpatient Hospital Stay (HOSPITAL_COMMUNITY): Payer: Medicaid Other

## 2023-02-17 DIAGNOSIS — J9811 Atelectasis: Secondary | ICD-10-CM | POA: Diagnosis not present

## 2023-02-17 DIAGNOSIS — I63511 Cerebral infarction due to unspecified occlusion or stenosis of right middle cerebral artery: Secondary | ICD-10-CM | POA: Diagnosis not present

## 2023-02-17 DIAGNOSIS — I639 Cerebral infarction, unspecified: Secondary | ICD-10-CM | POA: Diagnosis not present

## 2023-02-17 DIAGNOSIS — I5022 Chronic systolic (congestive) heart failure: Secondary | ICD-10-CM | POA: Diagnosis not present

## 2023-02-17 DIAGNOSIS — I63411 Cerebral infarction due to embolism of right middle cerebral artery: Secondary | ICD-10-CM | POA: Diagnosis not present

## 2023-02-17 DIAGNOSIS — I6340 Cerebral infarction due to embolism of unspecified cerebral artery: Secondary | ICD-10-CM

## 2023-02-17 DIAGNOSIS — I5043 Acute on chronic combined systolic (congestive) and diastolic (congestive) heart failure: Secondary | ICD-10-CM | POA: Diagnosis not present

## 2023-02-17 DIAGNOSIS — I5021 Acute systolic (congestive) heart failure: Secondary | ICD-10-CM | POA: Diagnosis not present

## 2023-02-17 DIAGNOSIS — R579 Shock, unspecified: Secondary | ICD-10-CM | POA: Diagnosis not present

## 2023-02-17 LAB — GLUCOSE, CAPILLARY
Glucose-Capillary: 115 mg/dL — ABNORMAL HIGH (ref 70–99)
Glucose-Capillary: 113 mg/dL — ABNORMAL HIGH (ref 70–99)
Glucose-Capillary: 120 mg/dL — ABNORMAL HIGH (ref 70–99)
Glucose-Capillary: 101 mg/dL — ABNORMAL HIGH (ref 70–99)
Glucose-Capillary: 107 mg/dL — ABNORMAL HIGH (ref 70–99)

## 2023-02-17 LAB — COMPREHENSIVE METABOLIC PANEL
ALT: 21 U/L (ref 0–44)
AST: 17 U/L (ref 15–41)
Albumin: 3.1 g/dL — ABNORMAL LOW (ref 3.5–5.0)
Alkaline Phosphatase: 65 U/L (ref 38–126)
Anion gap: 5 (ref 5–15)
BUN: 11 mg/dL (ref 6–20)
CO2: 26 mmol/L (ref 22–32)
Calcium: 9.5 mg/dL (ref 8.9–10.3)
Chloride: 102 mmol/L (ref 98–111)
Creatinine, Ser: 0.85 mg/dL (ref 0.44–1.00)
GFR, Estimated: >60 mL/min (ref >60)
Glucose, Bld: 116 mg/dL — ABNORMAL HIGH (ref 70–99)
Potassium: 3.8 mmol/L (ref 3.5–5.1)
Sodium: 133 mmol/L — ABNORMAL LOW (ref 135–145)
Total Bilirubin: 1 mg/dL (ref 0.3–1.2)
Total Protein: 7 g/dL (ref 6.5–8.1)

## 2023-02-17 LAB — COOXEMETRY PANEL
Carboxyhemoglobin: 1.1 % (ref 0.5–1.5)
Methemoglobin: <0.7 % (ref 0.0–1.5)
O2 Saturation: 58.1 %
Total hemoglobin: 12.1 g/dL (ref 12.0–16.0)

## 2023-02-17 LAB — CBC
HCT: 37.5 % (ref 36.0–46.0)
Hemoglobin: 11.6 g/dL — ABNORMAL LOW (ref 12.0–15.0)
MCH: 26.4 pg (ref 26.0–34.0)
MCHC: 30.9 g/dL (ref 30.0–36.0)
MCV: 85.2 fL (ref 80.0–100.0)
Platelets: 207 10*3/uL (ref 150–400)
RBC: 4.4 MIL/uL (ref 3.87–5.11)
RDW: 15.8 % — ABNORMAL HIGH (ref 11.5–15.5)
WBC: 12.8 10*3/uL — ABNORMAL HIGH (ref 4.0–10.5)
nRBC: 0 % (ref 0.0–0.2)

## 2023-02-17 LAB — LACTIC ACID, PLASMA: Lactic Acid, Venous: 1.2 mmol/L (ref 0.5–1.9)

## 2023-02-17 LAB — PHOSPHORUS: Phosphorus: 2.6 mg/dL (ref 2.5–4.6)

## 2023-02-17 LAB — MAGNESIUM: Magnesium: 2.2 mg/dL (ref 1.7–2.4)

## 2023-02-17 MED ORDER — DIGOXIN 125 MCG PO TABS
0.1250 mg | ORAL_TABLET | Freq: Every day | ORAL | Status: DC
  Administered 2023-02-17 – 2023-02-20 (×4): 0.125 mg via ORAL
  Filled 2023-02-17 (×4): qty 1

## 2023-02-17 MED ORDER — INSULIN ASPART 100 UNIT/ML IJ SOLN: 0.0000 [IU] | Freq: Three times a day (TID) | INTRAMUSCULAR | Status: DC

## 2023-02-17 MED ORDER — INSULIN ASPART 100 UNIT/ML IJ SOLN: 0.0000 [IU] | Freq: Every day | INTRAMUSCULAR | Status: DC

## 2023-02-17 MED ORDER — PANTOPRAZOLE SODIUM 40 MG PO TBEC: 40.0000 mg | DELAYED_RELEASE_TABLET | Freq: Every day | ORAL | Status: DC

## 2023-02-17 MED ORDER — SPIRONOLACTONE 12.5 MG HALF TABLET
12.5000 mg | ORAL_TABLET | Freq: Every day | ORAL | Status: DC
  Administered 2023-02-18: 12.5 mg via ORAL
  Filled 2023-02-17 (×2): qty 1

## 2023-02-17 MED ORDER — DOCUSATE SODIUM 100 MG PO CAPS
100.0000 mg | ORAL_CAPSULE | Freq: Two times a day (BID) | ORAL | Status: DC
  Administered 2023-02-17 – 2023-02-19 (×4): 100 mg via ORAL
  Filled 2023-02-17 (×6): qty 1

## 2023-02-17 NOTE — Progress Notes (Addendum)
Advanced Heart Failure Rounding Note  PCP-Cardiologist: Rollene Rotunda, MD   Subjective:    Currently on norepi 7 mcg. CO-OX pending. Unable to place A line.    Denies SOB.   Objective:   Weight Range: 111.4 kg Body mass index is 44.92 kg/m.   Vital Signs:   Temp:  [98.4 F (36.9 C)-99.4 F (37.4 C)] 98.4 F (36.9 C) (05/13 0800) Pulse Rate:  [80-131] 108 (05/13 0600) Resp:  [14-28] 17 (05/13 0600) BP: (91-138)/(48-122) 105/63 (05/13 0600) SpO2:  [85 %-100 %] 96 % (05/13 0600) FiO2 (%):  [50 %] 50 % (05/12 1110) Last BM Date :  (PTA)  Weight change: Filed Weights   02/15/23 1333  Weight: 111.4 kg    Intake/Output:   Intake/Output Summary (Last 24 hours) at 02/17/2023 0941 Last data filed at 02/17/2023 0600 Gross per 24 hour  Intake 1979.38 ml  Output 960 ml  Net 1019.38 ml      Physical Exam    General:  In the chair. . No resp difficulty HEENT: Normal Neck: Supple. JVP 7-8 . Carotids 2+ bilat; no bruits. No lymphadenopathy or thyromegaly appreciated. Cor: PMI nondisplaced. Tachy Regular rate & rhythm. No rubs, gallops or murmurs. Lungs: Clear Abdomen: obese, soft, nontender, nondistended. No hepatosplenomegaly. No bruits or masses. Good bowel sounds. Extremities: No cyanosis, clubbing, rash, edema. RUE PICC  Neuro: Alert & orientedx3, cranial nerves grossly intact. moves all 4 extremities w/o difficulty. Affect pleasant   Telemetry   ST 110s with multifocal PVCs   EKG    N/A   Labs    CBC Recent Labs    02/15/23 1245 02/15/23 1251 02/16/23 0945 02/17/23 0601  WBC 11.0*   < > 17.6* 12.8*  NEUTROABS 6.7  --   --   --   HGB 14.9   < > 12.6 11.6*  HCT 46.9*   < > 40.2 37.5  MCV 82.4   < > 85.4 85.2  PLT 337   < > 246 207   < > = values in this interval not displayed.   Basic Metabolic Panel Recent Labs    63/14/97 0945 02/17/23 0601  NA 140 133*  K 3.3* 3.8  CL 102 102  CO2 25 26  GLUCOSE 181* 116*  BUN 20 11  CREATININE  1.17* 0.85  CALCIUM 9.6 9.5  MG 2.3 2.2  PHOS 3.0 2.6   Liver Function Tests Recent Labs    02/16/23 0945 02/17/23 0601  AST 21 17  ALT 24 21  ALKPHOS 66 65  BILITOT 0.6 1.0  PROT 7.7 7.0  ALBUMIN 3.3* 3.1*   No results for input(s): "LIPASE", "AMYLASE" in the last 72 hours. Cardiac Enzymes No results for input(s): "CKTOTAL", "CKMB", "CKMBINDEX", "TROPONINI" in the last 72 hours.  BNP: BNP (last 3 results) Recent Labs    02/07/23 0947 02/15/23 2020  BNP 1,470.5* 1,305.5*    ProBNP (last 3 results) No results for input(s): "PROBNP" in the last 8760 hours.   D-Dimer No results for input(s): "DDIMER" in the last 72 hours. Hemoglobin A1C Recent Labs    02/15/23 2018  HGBA1C 6.1*   Fasting Lipid Panel Recent Labs    02/16/23 0945  CHOL 155  HDL 45  LDLCALC 98  TRIG 62  CHOLHDL 3.4   Thyroid Function Tests No results for input(s): "TSH", "T4TOTAL", "T3FREE", "THYROIDAB" in the last 72 hours.  Invalid input(s): "FREET3"  Other results:   Imaging    DG CHEST PORT  1 VIEW  Result Date: 02/17/2023 CLINICAL DATA:  Acute stroke. EXAM: PORTABLE CHEST 1 VIEW COMPARISON:  02/16/2023 FINDINGS: Stable cardiac enlargement. Interval extubation. Interval placement of right upper extremity PICC line with the catheter tip in the SVC. Improved pulmonary aeration bilaterally with bibasilar atelectasis remaining, right greater than left. No overt pulmonary edema, pleural effusions or pneumothorax. IMPRESSION: Improved pulmonary aeration bilaterally with bibasilar atelectasis remaining, right greater than left. Interval extubation. PICC line tip in SVC. Electronically Signed   By: Irish Lack M.D.   On: 02/17/2023 08:18   ECHOCARDIOGRAM COMPLETE  Result Date: 02/16/2023    ECHOCARDIOGRAM REPORT   Patient Name:   GRACILYN LAFORCE Date of Exam: 02/16/2023 Medical Rec #:  [id number]     Height:       62.0 in Accession #:    [id number]    Weight:       245.6 lb Date of Birth:   1971/03/26     BSA:          2.086 m Patient Age:    51 years      BP:           112/86 mmHg Patient Gender: F             HR:           94 bpm. Exam Location:  Inpatient Procedure: 2D Echo, Cardiac Doppler and Color Doppler Indications:    CHF-Acute Systolic I50.21  History:        Patient has prior history of Echocardiogram examinations, most                 recent 02/07/2023. CHF, Stroke and Shock; Risk                 Factors:Hypertension, Diabetes and Former Smoker.  Sonographer:    Celesta Gentile RCS Referring Phys: [id number] [facility]  Sonographer Comments: Patient on mechanical ventilator during echo exam. IMPRESSIONS  1. Left ventricular ejection fraction, by estimation, is <20%. The left ventricle has severely decreased function. The left ventricle demonstrates global hypokinesis. The left ventricular internal cavity size was severely dilated.  2. Right ventricular systolic function is mildly reduced. The right ventricular size is normal.  3. Left atrial size was moderately dilated.  4. Mild mitral valve regurgitation.  5. The aortic valve is tricuspid. Aortic valve regurgitation is not visualized.  6. The inferior vena cava is normal in size with greater than 50% respiratory variability, suggesting right atrial pressure of 3 mmHg. Comparison(s): The left ventricular function is unchanged. FINDINGS  Left Ventricle: Left ventricular ejection fraction, by estimation, is <20%. The left ventricle has severely decreased function. The left ventricle demonstrates global hypokinesis. The left ventricular internal cavity size was severely dilated. There is no left ventricular hypertrophy. Right Ventricle: The right ventricular size is normal. Right vetricular wall thickness was not assessed. Right ventricular systolic function is mildly reduced. Left Atrium: Left atrial size was moderately dilated. Right Atrium: Right atrial size was normal in size. Pericardium: Trivial pericardial effusion is present. Mitral Valve:  There is mild thickening of the mitral valve leaflet(s). There is mild calcification of the mitral valve leaflet(s). Mild mitral valve regurgitation. Tricuspid Valve: The tricuspid valve is normal in structure. Tricuspid valve regurgitation is trivial. Aortic Valve: The aortic valve is tricuspid. Aortic valve regurgitation is not visualized. Pulmonic Valve: The pulmonic valve was normal in structure. Pulmonic valve regurgitation is trivial. Aorta: The aortic root is normal in size and structure. Venous:  The inferior vena cava is normal in size with greater than 50% respiratory variability, suggesting right atrial pressure of 3 mmHg. IAS/Shunts: No atrial level shunt detected by color flow Doppler.  LEFT VENTRICLE PLAX 2D LVIDd:         6.70 cm LVIDs:         5.90 cm LV PW:         1.00 cm LV IVS:        0.90 cm LVOT diam:     1.90 cm LV SV:         41 LV SV Index:   20 LVOT Area:     2.84 cm  LV Volumes (MOD) LV vol d, MOD A2C: 196.0 ml LV vol d, MOD A4C: 237.0 ml LV vol s, MOD A2C: 127.0 ml LV vol s, MOD A4C: 175.0 ml LV SV MOD A2C:     69.0 ml LV SV MOD A4C:     237.0 ml LV SV MOD BP:      69.2 ml RIGHT VENTRICLE TAPSE (M-mode): 2.2 cm LEFT ATRIUM              Index        RIGHT ATRIUM           Index LA diam:        3.90 cm  1.87 cm/m   RA Area:     23.10 cm LA Vol (A2C):   72.5 ml  34.75 ml/m  RA Volume:   70.40 ml  33.75 ml/m LA Vol (A4C):   115.0 ml 55.13 ml/m LA Biplane Vol: 91.7 ml  43.96 ml/m  AORTIC VALVE LVOT Vmax:   105.30 cm/s LVOT Vmean:  76.667 cm/s LVOT VTI:    0.144 m  AORTA Ao Root diam: 3.20 cm MITRAL VALVE MV Area (PHT): 6.96 cm     SHUNTS MV Decel Time: 109 msec     Systemic VTI:  0.14 m MV E velocity: 103.00 cm/s  Systemic Diam: 1.90 cm Dietrich Pates MD Electronically signed by Dietrich Pates MD Signature Date/Time: 02/16/2023/4:05:08 PM    Final      Medications:     Scheduled Medications:  aspirin  325 mg Oral Daily   Chlorhexidine Gluconate Cloth  6 each Topical Daily   docusate   100 mg Oral BID   enoxaparin (LOVENOX) injection  40 mg Subcutaneous Q24H   insulin aspart  0-15 Units Subcutaneous TID WC   insulin aspart  0-5 Units Subcutaneous QHS   pantoprazole (PROTONIX) IV  40 mg Intravenous Q24H   polyethylene glycol  17 g Oral Daily   rosuvastatin  20 mg Oral Daily   sodium chloride flush  10-40 mL Intracatheter Q12H    Infusions:  sodium chloride Stopped (02/16/23 0138)   norepinephrine (LEVOPHED) Adult infusion 8 mcg/min (02/17/23 0820)    PRN Medications: sodium chloride, acetaminophen **OR** acetaminophen (TYLENOL) oral liquid 160 mg/5 mL **OR** acetaminophen, mouth rinse, senna-docusate, sodium chloride flush    Patient Profile   Claiborne Kushman is a 52 y.o. female with hypertension, hyperlipidemia, type 2 diabetes, obesity and recently diagnosed systolic heart failure currently admitted with stroke. On arrival code stroke was called with imaging demonstrating occlusion of the proximal right M2. She was sent to interventional radiology and intubated. Immediately postintubation she had PEA arrest requiring 2 minutes of CPR and epinephrine prior to ROSC. Since that time she has undergone diagnostic cerebral angiogram and thrombectomy for the right M2 occlusion now status post complete recanalization.  Assessment/Plan   PEA arrest w/ recently diagnosed systolic heart failure - PEA arrest likely after induction for intubation. Extubated yesterday. Sats stable.   - Newly diagnosed HFrEF earlier this month. ECHO preserved RV function, LVEF 15%. LHC /RHC with normal cors. MRI- LV EF 12% RV moderate dysfunction. Subendocardial LGE - ? Small infarct possible sarcoid. Will need PET scan . NICM ? PVCs ?  - CVP 7-8 in the chair. Hold diuretics.  - Remains on Norepi 7 mcg. Slow wean. - Add digoxin 0.125 mcg.  - Hold off on GDMT until off pressors.    2. Right M2 stroke - s/p mechanical thrombectomy with small SAH - Currently on aspirin. Will need eliquis  - On  statin.  - No deficits.    3. Acute Renal failure - Resolved.   4. DMII Hgb A1C 6.1  On SSI   5. H/O NSVT /PVCs  During recent hospitalization had > 40 beats NSVT, asymptomatic.  Dr Elberta Fortis EP saw plan for GDMT  May need to consider amio if she has recurrence. Will need life vest. Order placed.   Length of Stay: 2  Tonye Becket, NP  02/17/2023, 9:41 AM  Advanced Heart Failure Team Pager [phone] (M-F; 7a - 5p)  Please contact [facility] for night-coverage after hours (5p -7a ) and weekends on [email or web address]  Patient seen with NP, agree with the above note.   She is off norepinephrine this evening.  Feeling better.  CVP 7-8 cm.   General: NAD Neck: No JVD, no thyromegaly or thyroid nodule.  Lungs: Clear to auscultation bilaterally with normal respiratory effort. CV: Nondisplaced PMI.  Heart mildly tachy, regular S1/S2, no S3/S4, no murmur.  No peripheral edema.   Abdomen: Soft, nontender, no hepatosplenomegaly, no distention.  Skin: Intact without lesions or rashes.  Neurologic: Alert and oriented x 3.  Psych: Normal affect. Extremities: No clubbing or cyanosis.  HEENT: Normal.   Nonischemic cardiomyopathy, LGE pattern on cMRI consistent with small prior infarct vs cardiac sarcoidosis.  Not volume overloaded on exam, now off NE.  - Add digoxin 0.125.  - Can add spironolactone 12.5 tomorrow.  - Gradual uptitration of GDMT.  - Cardiac PET as outpatient.   PVCs/long NSVT run last admission.  Will place Lifevest.  Not enough PVCs currently to warrant amiodarone.   S/p CVA, likely cardioembolic. Start Eliquis when ok with neurology.   Marca Ancona 02/17/2023 6:13 PM

## 2023-02-17 NOTE — Evaluation (Signed)
Speech Language Pathology Evaluation Patient Details Name: Stephanie Reilly MRN: [id number] DOB: 21-Dec-1970 Today's Date: 02/17/2023 Time: 8295-6213 SLP Time Calculation (min) (ACUTE ONLY): 8 min  Problem List:  Patient Active Problem List   Diagnosis Date Noted   Stroke (HCC) 02/15/2023   Shock circulatory (HCC) 02/15/2023   Iron deficiency anemia 02/09/2023   Heart failure (HCC) 02/08/2023   Hypokalemia 02/08/2023   Acute on chronic systolic CHF (congestive heart failure) (HCC) 02/07/2023   Essential hypertension 02/07/2023   Diabetes mellitus type 2, diet-controlled (HCC) 02/07/2023   Elevated troponin 02/07/2023   Obesity, Class III, BMI 40-49.9 (morbid obesity) (HCC) 02/07/2023   Past Medical History:  Past Medical History:  Diagnosis Date   Anemia    Anxiety    Arthritis    Depression    Hypertension    Prediabetes    Past Surgical History:  Past Surgical History:  Procedure Laterality Date   ABDOMINAL HYSTERECTOMY     RADIOLOGY WITH ANESTHESIA N/A 02/15/2023   Procedure: IR WITH ANESTHESIA;  Surgeon: Radiologist, Medication, MD;  Location: [facility] OR;  Service: Radiology;  Laterality: N/A;   RIGHT/LEFT HEART CATH AND CORONARY ANGIOGRAPHY N/A 02/10/2023   Procedure: RIGHT/LEFT HEART CATH AND CORONARY ANGIOGRAPHY;  Surgeon: [place], Peter M, MD;  Location: [facility] INVASIVE CV LAB;  Service: Cardiovascular;  Laterality: N/A;   TONSILLECTOMY     TUBAL LIGATION     HPI:  52 yo female presenting to ED on 5/11 with slurred speech and L facial asymmetry. Pt was found to have right M2 occlusion on CTA and went for mechanical thrombectomy. Coded with ROSC achieved after 2 minutes. Resultant trace subarachnoid hemorrhage in the right sylvian fissure seen. Vent 5/11-5/12; required pressors. PMH including HTN, HLD, anxiety and depression, newly diagnoses CHF, DM, IDA, Advanced NICM, and morbid obesity.   Assessment / Plan / Recommendation Clinical Impression  Pt participated in  cognitive-linguistic assessment revealing resolved dysarthria; clear, fluent speech and no focal CN deficits.  Attention, working memory, and verbal problem solving were WNL. Language intact. No SLP needs identified - our service will sign off.    SLP Assessment  SLP Recommendation/Assessment: Patient does not need any further Speech Lanaguage Pathology Services SLP Visit Diagnosis: Cognitive communication deficit (R41.841)    Recommendations for follow up therapy are one component of a multi-disciplinary discharge planning process, led by the attending physician.  Recommendations may be updated based on patient status, additional functional criteria and insurance authorization.    Follow Up Recommendations  No SLP follow up                        SLP Evaluation Cognition  Overall Cognitive Status: Within Functional Limits for tasks assessed Arousal/Alertness: Awake/alert Orientation Level: Oriented X4 Memory: Appears intact Awareness: Appears intact Problem Solving: Appears intact Safety/Judgment: Appears intact       Comprehension  Auditory Comprehension Overall Auditory Comprehension: Appears within functional limits for tasks assessed Reading Comprehension Reading Status: Within funtional limits    Expression Expression Primary Mode of Expression: Verbal Verbal Expression Overall Verbal Expression: Appears within functional limits for tasks assessed Written Expression Dominant Hand: Right   Oral / Motor  Oral Motor/Sensory Function Overall Oral Motor/Sensory Function: Within functional limits Motor Speech Overall Motor Speech: Appears within functional limits for tasks assessed            Blenda Mounts Laurice 02/17/2023, 9:43 AM Marchelle Folks L. Samson Frederic, MA CCC/SLP Clinical Specialist - Acute Care SLP Acute Rehabilitation  Services Office number [phone]

## 2023-02-17 NOTE — Progress Notes (Signed)
NAME:  Stephanie Reilly, MRN:  [id number], DOB:  [birth year], LOS: 2 ADMISSION DATE:  02/15/2023, CONSULTATION DATE:  02/05/2023 REFERRING MD:  Lizabeth Leyden of stroke service, CHIEF COMPLAINT:  code stroke -> Caridac arrest    BRIEF   Stephanie Reilly =52 year old female with past medical history of hypertension, hyperlipidemia, diabetes obesity anxiety, depression new diagnosis of systolic heart failure [facility] May, 2024 with ejection fraction 12%].  She was discharged 02/12/2023 on aspirin, torsemide Entresto Aldactone and Farxiga.  She presented acutely on 02/15/2023 with slurred speech and facial droop that resolved prior to coming to the ED and within 1 minute..  Code stroke showed occlusion of proximal right M2.  Diagnosis of acute right MCA ischemic infarct was made.  Referred to interventional radiology and patient intubated for procedure. Immediate post intubation had PEA arest with 2 min CPR and epix x 1 with ROSC. Post ROSC has needed epi gtt (reported not responding to other vasopressors].  And multiple pushes of epinephrine at least 4-5 by the time of transfer to the ICU.  While on epinephrine drip, and underwent diagnostic cerebral angiogram and mechanical thrombectomy for right M2 occlusion with right radial artery access.  She is status post complete recanalization after 3 passes.  Resultant trace subarachnoid hemorrhage in the right sylvian fissure seen.  Systolic blood pressure goal 120-160 and sent to for not ICU.  Critical care medicine consulted.  No thrombolysis given Epinephrine going to peripheral line. On propofol on the ventilator.  Past Medical History:    has a past medical history of Anemia, Anxiety, Arthritis, Depression, Hypertension, and Prediabetes.   has a past surgical history that includes Abdominal hysterectomy; Tonsillectomy; Tubal ligation; RIGHT/LEFT HEART CATH AND CORONARY ANGIOGRAPHY (N/A, 02/10/2023); and Radiology with anesthesia (N/A, 02/15/2023).   Significant  Hospital Events:  02/15/2023 - admit  Post Arrest and mechanical thrombectomy . Since then pressor dependent. S/p R IJ and IO left tibia briefly  Interim History / Subjective:  Off vent Pressor needs improving Denies pain No residual neuro issues  Objective   Blood pressure 105/63, pulse (!) 108, temperature 98.4 F (36.9 C), temperature source Oral, resp. rate 17, height 5\' 2"  (1.575 m), weight 111.4 kg, SpO2 96 %. CVP:  [2 mmHg-18 mmHg] 15 mmHg  Vent Mode: PSV;CPAP FiO2 (%):  [50 %] 50 % PEEP:  [5 cmH20] 5 cmH20 Pressure Support:  [5 cmH20] 5 cmH20   Intake/Output Summary (Last 24 hours) at 02/17/2023 0825 Last data filed at 02/17/2023 0600 Gross per 24 hour  Intake 2096.21 ml  Output 960 ml  Net 1136.21 ml    Filed Weights   02/15/23 1333  Weight: 111.4 kg   No distress Lungs clear Moves ext to command Abd soft Ext no edema RASS 0  BMP normal Improving white count CBC slightly lower Echo reviewed  Resolved Hospital Problem list   AKI Resp failure  Assessment & Plan:   R MCA stroke- complicated by IHCA presumably due to induction in setting of advanced NICM with little reserve.  No issues other than some post arrest vasoplegia Shock- in context of post arrest period and baseline EF around 10-15%; improving Advanced NICM Obesity Diabetes Hypertension Hyperlipidemia Metabolic syndrome  - Wean levophed for MAP 65 - Remove foley - Progressive mobility - GDMT post stroke per neuro and aHF discussion: I think plan is to transition for full dose aspirin to NoAC - Will follow until stable off pressors  31 min cc time [name]  MD PCCM
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
## 2023-02-17 NOTE — Evaluation (Signed)
Occupational Therapy Evaluation Patient Details Name: Stephanie Reilly MRN: [id number] DOB: Feb 24, 1971 Today's Date: 02/17/2023   History of Present Illness 52 yo female presenting to ED on 5/11 with slurred speech and L facial droop. CT on 5/11 neg for acute changes and showing small chronic lacunar infarct. Awaiting MRI. PMH including HTN, HLD, anxiety and depression, newly diagnoses CHF, DM, IDA, and morbid obesity.   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 Currently, pt performing at Supervision-Mod I level for ADLs and functional mobility. Pt presenting near baseline functional mobility and very happy to participate in OOB activity. Provided education on BEFAST for stroke signs and symptoms; pt able to recall 4/5 symptoms. Answered all pt questions. Recommend dc home once medically stable per physician. All acute OT needs met and will sign off. Thank you.      Recommendations for follow up therapy are one component of a multi-disciplinary discharge planning process, led by the attending physician.  Recommendations may be updated based on patient status, additional functional criteria and insurance authorization.   Assistance Recommended at Discharge PRN  Patient can return home with the following      Functional Status Assessment  Patient has had a recent decline in their functional status and demonstrates the ability to make significant improvements in function in a reasonable and predictable amount of time.  Equipment Recommendations  None recommended by OT    Recommendations for Other Services       Precautions / Restrictions Precautions Precautions: Fall      Mobility Bed Mobility Overal bed mobility: Modified Independent                  Transfers Overall transfer level: Needs assistance Equipment used: None Transfers: Sit to/from Stand Sit to Stand: Supervision            General transfer comment: Supervision for initial safety      Balance Overall balance assessment: No apparent balance deficits (not formally assessed)                                         ADL either performed or assessed with clinical judgement   ADL Overall ADL's : Needs assistance/impaired                                       General ADL Comments: Pt performing at Supervision level for ADLs for initial safety     Vision Baseline Vision/History: 1 Wears glasses (all the time)       Perception     Praxis Praxis Praxis tested?: Not tested    Pertinent Vitals/Pain Pain Assessment Pain Assessment: No/denies pain (Simultaneous filing. User may not have seen previous data.)     Hand Dominance Right (Simultaneous filing. User may not have seen previous data.)   Extremity/Trunk Assessment Upper Extremity Assessment Upper Extremity Assessment: Overall WFL for tasks assessed   Lower Extremity Assessment Lower Extremity Assessment: Overall WFL for tasks assessed   Cervical / Trunk Assessment Cervical / Trunk Assessment: Other exceptions Cervical / Trunk Exceptions: increased body habitus   Communication Communication Communication: No difficulties   Cognition Arousal/Alertness: Awake/alert Behavior During Therapy: WFL for tasks assessed/performed Overall Cognitive Status: Within Functional Limits for  tasks assessed (Simultaneous filing. User may not have seen previous data.)                                       General Comments  HR 120s with activity. Daughter present. Reviewed BEFAST. Pt able to recall "EFAST" after education.    Exercises     Shoulder Instructions      Home Living Family/patient expects to be discharged to:: Private residence Living Arrangements: Parent Available Help at Discharge: Family;Available PRN/intermittently Type of Home: Apartment (Simultaneous filing. 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        OT Problem List: Decreased activity tolerance;Impaired balance (sitting and/or standing);Decreased knowledge of use of DME or AE;Decreased knowledge of precautions      OT Treatment/Interventions:      OT Goals(Current goals can be found in the care plan section) Acute Rehab OT Goals Patient Stated Goal: Go home OT Goal Formulation: All assessment and education complete, DC therapy  OT Frequency:      Co-evaluation PT/OT/SLP Co-Evaluation/Treatment: Yes Reason for Co-Treatment: To address functional/ADL transfers   OT goals addressed during session: ADL's and self-care      AM-PAC OT "6 Clicks" Daily Activity     Outcome Measure Help from another person eating meals?: None Help from another person taking care of personal grooming?: None Help from another person toileting, which includes using toliet, bedpan, or urinal?: None Help from another person bathing (including washing, rinsing, drying)?: None Help from another person to put on and taking off regular upper body clothing?: None Help from another person to put on and taking off regular lower body clothing?: None 6 Click Score: 24   End of Session Nurse Communication: Mobility status  Activity Tolerance: Patient tolerated treatment well Patient left: in chair;with call bell/phone within reach;with family/visitor present                   Time: 2956-2130 OT Time Calculation (min): 24 min Charges:  OT General Charges $OT Visit: 1 Visit OT  Evaluation $OT Eval Moderate Complexity: 1 Mod  Antwoine Zorn MSOT, OTR/L Acute Rehab Office: [phone]  Theodoro Grist Cadynce Garrette 02/17/2023, 10:07 AM

## 2023-02-17 NOTE — Progress Notes (Signed)
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 She reports she has no symptoms or problems with movement, cognition, vision, or memory.   Allergies: Paroxetine hcl, Prednisone, Chocolate, Ibuprofen, Iron, Latex, Lisinopril, Naproxen, Tramadol, Tylenol [acetaminophen], Penicillins, and Serotonin  Medications: Prior to Admission medications   Medication Sig Start Date End Date Taking? Authorizing Provider  acetaminophen (TYLENOL) 500 MG tablet Take 1,000 mg by mouth as needed for moderate pain.   Yes [provider]  Ascorbic Acid (VITAMIN C) 100 MG tablet Take 100 mg by mouth daily.   Yes [provider]  aspirin 81 MG chewable tablet Chew 1 tablet (81 mg total) by mouth daily. 02/13/23  Yes Zannie Cove, MD  Biotin (BIOTIN MAXIMUM STRENGTH) 10 MG TABS Take 10 mg by mouth daily. 08/04/17  Yes [provider]  cetirizine (ZYRTEC) 10 MG tablet Take 10 mg by mouth daily.   Yes [provider]  dapagliflozin propanediol (FARXIGA) 10 MG TABS tablet Take 1 tablet (10 mg total) by mouth daily. 02/13/23  Yes Zannie Cove, MD  metoprolol succinate (TOPROL-XL) 50 MG 24 hr tablet Take 1 tablet (50 mg total) by mouth daily. Take with or immediately following a meal. 02/13/23  Yes Zannie Cove, MD  Multiple Vitamins-Minerals (HAIR SKIN & NAILS PO) Take 2 tablets by mouth daily.   Yes [provider]  sacubitril-valsartan (ENTRESTO) 24-26 MG Take 1 tablet by mouth 2 (two) times daily. 02/12/23  Yes Zannie Cove, MD  spironolactone (ALDACTONE) 25 MG tablet Take 1 tablet (25 mg total) by mouth daily. 02/13/23  Yes Zannie Cove, MD  torsemide (DEMADEX) 20 MG tablet Take 2 tablets (40 mg total) by mouth  daily. 02/13/23  Yes Zannie Cove, MD  Vitamin E 45 MG (100 UNIT) CAPS Take 100 Units by mouth daily. 08/04/17  Yes [provider]     Vital Signs: BP 105/63   Pulse (!) 108   Temp 98.4 F (36.9 C) (Oral)   Resp 17   Ht 5\' 2"  (1.575 m)   Wt 245 lb 9.5 oz (111.4 kg)   SpO2 96%   BMI 44.92 kg/m   Physical Exam Vitals reviewed.  Constitutional:      General: She is not in acute distress. Cardiovascular:     Pulses: Normal pulses.  Skin:    General: Skin is warm and dry.  Neurological:     Mental Status: She is alert.  Psychiatric:        Mood and Affect: Mood normal.        Behavior: Behavior normal.        Thought Content: Thought content normal.        Judgment: Judgment normal.   Alert, awake, and oriented x4 Speech and comprehension intact PERRL  EOMs intact, no nystagmus or subjective diplopia. Visual fields intact No facial asymmetry. Tongue midline  Motor power 5/5 in all extremities No pronator drift. Fine motor and coordination intact Gait not assessed Distal pulses palpable Right radial puncture site soft, non-tender.    Imaging: DG CHEST PORT 1 VIEW  Result Date: 02/17/2023 CLINICAL DATA:  Acute stroke. EXAM: PORTABLE CHEST 1 VIEW COMPARISON:  02/16/2023 FINDINGS: Stable cardiac enlargement. Interval extubation. Interval placement of right upper extremity PICC line with  the catheter tip in the SVC. Improved pulmonary aeration bilaterally with bibasilar atelectasis remaining, right greater than left. No overt pulmonary edema, pleural effusions or pneumothorax. IMPRESSION: Improved pulmonary aeration bilaterally with bibasilar atelectasis remaining, right greater than left. Interval extubation. PICC line tip in SVC. Electronically Signed   By: Irish Lack M.D.   On: 02/17/2023 08:18   ECHOCARDIOGRAM COMPLETE  Result Date: 02/16/2023    ECHOCARDIOGRAM REPORT   Patient Name:   Stephanie Reilly Date of Exam: 02/16/2023 Medical Rec #:  [id number]      Height:       62.0 in Accession #:    [id number]    Weight:       245.6 lb Date of Birth:  1971/03/23     BSA:          2.086 m Patient Age:    51 years      BP:           112/86 mmHg Patient Gender: F             HR:           94 bpm. Exam Location:  Inpatient Procedure: 2D Echo, Cardiac Doppler and Color Doppler Indications:    CHF-Acute Systolic I50.21  History:        Patient has prior history of Echocardiogram examinations, most                 recent 02/07/2023. CHF, Stroke and Shock; Risk                 Factors:Hypertension, Diabetes and Former Smoker.  Sonographer:    Celesta Gentile RCS Referring Phys: [id number] [facility]  Sonographer Comments: Patient on mechanical ventilator during echo exam. IMPRESSIONS  1. Left ventricular ejection fraction, by estimation, is <20%. The left ventricle has severely decreased function. The left ventricle demonstrates global hypokinesis. The left ventricular internal cavity size was severely dilated.  2. Right ventricular systolic function is mildly reduced. The right ventricular size is normal.  3. Left atrial size was moderately dilated.  4. Mild mitral valve regurgitation.  5. The aortic valve is tricuspid. Aortic valve regurgitation is not visualized.  6. The inferior vena cava is normal in size with greater than 50% respiratory variability, suggesting right atrial pressure of 3 mmHg. Comparison(s): The left ventricular function is unchanged. FINDINGS  Left Ventricle: Left ventricular ejection fraction, by estimation, is <20%. The left ventricle has severely decreased function. The left ventricle demonstrates global hypokinesis. The left ventricular internal cavity size was severely dilated. There is no left ventricular hypertrophy. Right Ventricle: The right ventricular size is normal. Right vetricular wall thickness was not assessed. Right ventricular systolic function is mildly reduced. Left Atrium: Left atrial size was moderately dilated. Right Atrium: Right atrial size  was normal in size. Pericardium: Trivial pericardial effusion is present. Mitral Valve: There is mild thickening of the mitral valve leaflet(s). There is mild calcification of the mitral valve leaflet(s). Mild mitral valve regurgitation. Tricuspid Valve: The tricuspid valve is normal in structure. Tricuspid valve regurgitation is trivial. Aortic Valve: The aortic valve is tricuspid. Aortic valve regurgitation is not visualized. Pulmonic Valve: The pulmonic valve was normal in structure. Pulmonic valve regurgitation is trivial. Aorta: The aortic root is normal in size and structure. Venous: The inferior vena cava is normal in size with greater than 50% respiratory variability, suggesting right atrial pressure of 3 mmHg. IAS/Shunts: No atrial level shunt detected by color flow Doppler.  LEFT VENTRICLE  PLAX 2D LVIDd:         6.70 cm LVIDs:         5.90 cm LV PW:         1.00 cm LV IVS:        0.90 cm LVOT diam:     1.90 cm LV SV:         41 LV SV Index:   20 LVOT Area:     2.84 cm  LV Volumes (MOD) LV vol d, MOD A2C: 196.0 ml LV vol d, MOD A4C: 237.0 ml LV vol s, MOD A2C: 127.0 ml LV vol s, MOD A4C: 175.0 ml LV SV MOD A2C:     69.0 ml LV SV MOD A4C:     237.0 ml LV SV MOD BP:      69.2 ml RIGHT VENTRICLE TAPSE (M-mode): 2.2 cm LEFT ATRIUM              Index        RIGHT ATRIUM           Index LA diam:        3.90 cm  1.87 cm/m   RA Area:     23.10 cm LA Vol (A2C):   72.5 ml  34.75 ml/m  RA Volume:   70.40 ml  33.75 ml/m LA Vol (A4C):   115.0 ml 55.13 ml/m LA Biplane Vol: 91.7 ml  43.96 ml/m  AORTIC VALVE LVOT Vmax:   105.30 cm/s LVOT Vmean:  76.667 cm/s LVOT VTI:    0.144 m  AORTA Ao Root diam: 3.20 cm MITRAL VALVE MV Area (PHT): 6.96 cm     SHUNTS MV Decel Time: 109 msec     Systemic VTI:  0.14 m MV E velocity: 103.00 cm/s  Systemic Diam: 1.90 cm Dietrich Pates MD Electronically signed by Dietrich Pates MD Signature Date/Time: 02/16/2023/4:05:08 PM    Final    DG CHEST PORT 1 VIEW  Result Date: 02/16/2023 CLINICAL  DATA:  ETT EXAM: PORTABLE CHEST 1 VIEW COMPARISON:  Feb 15, 2023 FINDINGS: The ETT is in good position. No pneumothorax. The lungs remain clear. Stable cardiomegaly. The hila and mediastinum are unchanged. No other acute abnormalities are identified. IMPRESSION: The ETT is in good position. No other interval changes. Electronically Signed   By: Gerome Sam III M.D.   On: 02/16/2023 08:56   [place] EKG SITE RITE  Result Date: 02/16/2023 If Site Rite image not attached, placement could not be confirmed due to current cardiac rhythm.  DG CHEST PORT 1 VIEW  Result Date: 02/15/2023 CLINICAL DATA:  Check central line placement EXAM: PORTABLE CHEST 1 VIEW COMPARISON:  02/07/2023 FINDINGS: New right jugular central line is noted with the catheter tip in the region of the right innominate vein. Endotracheal tube is noted in satisfactory position. Cardiac shadow is enlarged but stable. The lungs are well aerated. Mild central vascular congestion is seen. No bony abnormality is noted. No pneumothorax is seen. IMPRESSION: No pneumothorax following central line placement. The central line tip is in the region of the right innominate vein. Mild vascular congestion. Electronically Signed   By: Alcide Clever M.D.   On: 02/15/2023 19:28   CT ANGIO HEAD NECK W WO CM  Result Date: 02/15/2023 CLINICAL DATA:  Provided history: Transient ischemic attack. Syncope, left-sided facial droop. EXAM: CT ANGIOGRAPHY HEAD AND NECK WITH AND WITHOUT CONTRAST TECHNIQUE: Multidetector CT imaging of the head and neck was performed using the standard protocol during bolus  administration of intravenous contrast. Multiplanar CT image reconstructions and MIPs were obtained to evaluate the vascular anatomy. Carotid stenosis measurements (when applicable) are obtained utilizing NASCET criteria, using the distal internal carotid diameter as the denominator. RADIATION DOSE REDUCTION: This exam was performed according to the departmental  dose-optimization program which includes automated exposure control, adjustment of the mA and/or kV according to patient size and/or use of iterative reconstruction technique. CONTRAST:  Administered contrast not known at this time. COMPARISON:  Head CT performed earlier today 02/15/2023. FINDINGS: CTA NECK FINDINGS Aortic arch: Standard aortic branching. Mild sclerotic plaque within the visualized aortic arch. No hemodynamically significant innominate or proximal subclavian artery stenosis. Right carotid system: CCA and ICA patent within the neck without stenosis or significant atherosclerotic disease. Tortuosity of the cervical ICA. Left carotid system: CCA and ICA patent within the neck without stenosis or significant atherosclerotic disease. Tortuosity of the cervical ICA. Vertebral arteries: Patent within the neck without stenosis or significant atherosclerotic disease. The left vertebral artery is slightly dominant. Skeleton: No acute fracture. Other neck: No neck mass or cervical lymphadenopathy. Upper chest: No consolidation within the imaged lung apices. 10 mm cyst within the right upper lobe. Review of the MIP images confirms the above findings CTA HEAD FINDINGS Anterior circulation: The intracranial internal carotid arteries are patent. Nonstenotic atherosclerotic plaque within the left cavernous segment. The M1 middle cerebral arteries are patent. Abrupt occlusion of a proximal M2 right MCA vessel (series 12, image 44) (series 11, image 17). No left M2 proximal branch occlusion or high-grade proximal stenosis is identified. The anterior cerebral arteries are patent. Posterior circulation: The intracranial vertebral arteries are patent. The basilar artery is patent. The posterior cerebral arteries are patent. A right posterior communicating artery is present. The left posterior communicating artery is diminutive or absent. Venous sinuses: Within the limitations of contrast timing, no convincing thrombus.  Anatomic variants: As described. Review of the MIP images confirms the above findings CTA head impression #1 called by telephone at the time of interpretation on 02/15/2023 at 1:29 pm to provider Gloris Manchester , who verbally acknowledged these results. IMPRESSION: CTA neck: 1. The common carotid, internal carotid and vertebral arteries are patent within the neck without stenosis or significant atherosclerotic disease. 2.  Aortic Atherosclerosis (ICD10-I70.0). CTA head: 1. Abrupt occlusion of a proximal M2 right middle cerebral artery vessel. Neuro-interventional consultation recommended. 2. Mild non-stenotic atherosclerotic plaque within the cavernous left eye Electronically Signed   By: Jackey Loge D.O.   On: 02/15/2023 13:33   CT HEAD CODE STROKE WO CONTRAST  Result Date: 02/15/2023 CLINICAL DATA:  Code stroke. Neuro deficit, acute, stroke suspected. Syncope. Left-sided facial droop. EXAM: CT HEAD WITHOUT CONTRAST TECHNIQUE: Contiguous axial images were obtained from the base of the skull through the vertex without intravenous contrast. RADIATION DOSE REDUCTION: This exam was performed according to the departmental dose-optimization program which includes automated exposure control, adjustment of the mA and/or kV according to patient size and/or use of iterative reconstruction technique. COMPARISON:  Report from head CT 05/11/2001 (images unavailable) FINDINGS: Brain: Cerebral volume is normal. A small chronic lacunar infarct is questioned within the left corona radiata (for instance as seen on series 3, image 19) (series 5, image 32). There is no acute intracranial hemorrhage. No demarcated cortical infarct. No extra-axial fluid collection. No evidence of an intracranial mass. No midline shift. Vascular: No hyperdense vessel. Skull: No fracture or aggressive osseous lesion. Sinuses/Orbits: No mass or acute finding within the imaged orbits. No significant  paranasal sinus disease at the imaged levels. ASPECTS  [facility] Stroke Program Early CT Score) - Ganglionic level infarction (caudate, lentiform nuclei, internal capsule, insula, M1-M3 cortex): 7 - Supraganglionic infarction (M4-M6 cortex): 3 Total score (0-10 with 10 being normal): 10 These results were communicated to Dr. Roda Shutters At 1:04 pmon 5/11/2024by text page via the [facility] messaging system. IMPRESSION: 1. No evidence of an acute intracranial abnormality. ASPECTS is 10. 2. Possible small chronic lacunar infarct within the left corona radiata. Electronically Signed   By: Jackey Loge D.O.   On: 02/15/2023 13:15    Labs:  CBC: Recent Labs    02/15/23 1245 02/15/23 1251 02/15/23 2020 02/16/23 0646 02/16/23 0945 02/17/23 0601  WBC 11.0*  --  21.1*  --  17.6* 12.8*  HGB 14.9   < > 13.7 14.3 12.6 11.6*  HCT 46.9*   < > 44.1 42.0 40.2 37.5  PLT 337  --  310  --  246 207   < > = values in this interval not displayed.    COAGS: Recent Labs    02/07/23 1738 02/07/23 2001 02/15/23 1245 02/15/23 2020  INR 1.2 1.3* 1.1 1.2  APTT  --  41*  --   --     BMP: Recent Labs    02/15/23 1245 02/15/23 1251 02/15/23 1540 02/15/23 2020 02/16/23 0646 02/16/23 0945 02/17/23 0601  NA 134* 136   < > 140 143 140 133*  K 3.9 3.9   < > 3.4* 3.4* 3.3* 3.8  CL 98 103  --  101  --  102 102  CO2 22  --   --  24  --  25 26  GLUCOSE 134* 137*  --  318*  --  181* 116*  BUN 21* 23*  --  21*  --  20 11  CALCIUM 9.7  --   --  8.8*  --  9.6 9.5  CREATININE 1.07* 1.00  --  1.46*  --  1.17* 0.85  GFRNONAA >60  --   --  43*  --  56* >60   < > = values in this interval not displayed.    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 She was extubated on 02/16/23 without issue. She reports that she has Cardiology follow-up scheduled for later this month for her CHF. Patient current on ASA 325 mg. NIR will continue to follow, please contact NIR team with any questions or concerns. Further management per Neurology, Cardiology, and Critical Care teams.  Electronically Signed: Kennieth Francois, PA-C 02/17/2023, 11:50 AM   I spent a total of 25 Minutes at the the patient's bedside AND on the patient's hospital floor or unit, greater than 50% of which was counseling/coordinating care for Occlusion of right M2/MCA s/p revascularization 02/15/23.

## 2023-02-17 NOTE — Evaluation (Signed)
Physical Therapy Evaluation and Discharge Patient Details Name: Stephanie Reilly MRN: [id number] DOB: 1971-06-24 Today's Date: 02/17/2023  History of Present Illness  52 yo female presenting to ED on 5/11 with slurred speech and L facial droop. CT on 5/11 neg for acute changes and showing small chronic lacunar infarct. Awaiting MRI. PMH including HTN, HLD, anxiety and depression, newly diagnoses CHF, DM, IDA, and morbid obesity.  Clinical Impression   Patient evaluated by Physical Therapy with no further acute PT needs identified. All education has been completed and the patient has no further questions. Patient is at baseline for mobility. PT is signing off. Thank you for this referral.        Recommendations for follow up therapy are one component of a multi-disciplinary discharge planning process, led by the attending physician.  Recommendations may be updated based on patient status, additional functional criteria and insurance authorization.  Follow Up Recommendations       Assistance Recommended at Discharge PRN  Patient can return home with the following  Assist for transportation    Equipment Recommendations None recommended by PT  Recommendations for Other Services       Functional Status Assessment Patient has not had a recent decline in their functional status     Precautions / Restrictions Precautions Precautions: Fall Precaution Comments: watch HR Restrictions Weight Bearing Restrictions: No      Mobility  Bed Mobility Overal bed mobility: Modified Independent                  Transfers Overall transfer level: Needs assistance Equipment used: None Transfers: Sit to/from Stand Sit to Stand: Supervision, Independent           General transfer comment: Supervision for initial safety; progressed to independent from toilet    Ambulation/Gait Ambulation/Gait assistance: Independent Gait Distance (Feet): 30 Feet Assistive device: None Gait  Pattern/deviations: WFL(Within Functional Limits) Gait velocity: WNL     General Gait Details: no LOB noted  Stairs            Wheelchair Mobility    Modified Rankin (Stroke Patients Only) Modified Rankin (Stroke Patients Only) Pre-Morbid Rankin Score: No symptoms Modified Rankin: No symptoms     Balance Overall balance assessment: No apparent balance deficits (not formally assessed)                                           Pertinent Vitals/Pain Pain Assessment Pain Assessment: No/denies pain    Home Living Family/patient expects to be discharged to:: Private residence Living Arrangements: Parent Available Help at Discharge: Family;Available PRN/intermittently Type of Home: Apartment Home Access: Level entry       Home Layout: One level Home Equipment: BSC/3in1;Shower seat;Grab bars - toilet;Grab bars - tub/shower;Rolling Walker (2 wheels) Additional Comments: Majority of DME is her mothers    Prior Function Prior Level of Function : Independent/Modified Independent;Driving;Working/employed             Mobility Comments: No AD ADLs Comments: works as a Loss adjuster, 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     Hand Dominance   Dominant Hand: Right    Extremity/Trunk Assessment   Upper Extremity Assessment Upper Extremity Assessment: Overall WFL for tasks assessed    Lower Extremity Assessment Lower Extremity Assessment: Overall WFL for tasks assessed    Cervical / Trunk Assessment Cervical / Trunk Assessment: Other exceptions Cervical / Trunk Exceptions: increased body habitus  Communication   Communication: No difficulties  Cognition Arousal/Alertness: Awake/alert Behavior During Therapy: WFL for tasks assessed/performed Overall Cognitive Status: Within Functional Limits for tasks assessed                                          General Comments General comments (skin integrity, edema, etc.): HR 120s with activity    Exercises      Assessment/Plan    PT Assessment Patient does not need any further PT services  PT Problem List         PT Treatment Interventions      PT Goals (Current goals can be found in the Care Plan section)  Acute Rehab PT Goals PT Goal Formulation: All assessment and education complete, DC therapy    Frequency       Co-evaluation PT/OT/SLP Co-Evaluation/Treatment: Yes Reason for Co-Treatment: To address functional/ADL transfers   OT goals addressed during session: ADL's and self-care       AM-PAC PT "6 Clicks" Mobility  Outcome Measure Help needed turning from your back to your side while in a flat bed without using bedrails?: None Help needed moving from lying on your back to sitting on the side of a flat bed without using bedrails?: None Help needed moving to and from a bed to a chair (including a wheelchair)?: None Help needed standing up from a chair using your arms (e.g., wheelchair or bedside chair)?: None Help needed to walk in hospital room?: None Help needed climbing 3-5 steps with a railing? : None 6 Click Score: 24    End of Session   Activity Tolerance: Patient tolerated treatment well Patient left: with call bell/phone within reach;in chair;with nursing/sitter in room;with family/visitor present Nurse Communication: Mobility status PT Visit Diagnosis: Other abnormalities of gait and mobility (R26.89)    Time: 1610-9604 PT Time Calculation (min) (ACUTE ONLY): 22 min   Charges:   PT Evaluation $PT Eval Low Complexity: 1 Low           Jerolyn Center, PT Acute Rehabilitation Services  Office [phone]   Zena Amos 02/17/2023, 10:09 AM

## 2023-02-18 ENCOUNTER — Other Ambulatory Visit (HOSPITAL_COMMUNITY): Payer: Self-pay

## 2023-02-18 ENCOUNTER — Encounter (HOSPITAL_COMMUNITY): Payer: Self-pay | Admitting: Neurology

## 2023-02-18 ENCOUNTER — Other Ambulatory Visit: Payer: Self-pay

## 2023-02-18 ENCOUNTER — Telehealth (HOSPITAL_COMMUNITY): Payer: Self-pay | Admitting: Pharmacy Technician

## 2023-02-18 DIAGNOSIS — I63413 Cerebral infarction due to embolism of bilateral middle cerebral arteries: Secondary | ICD-10-CM | POA: Diagnosis not present

## 2023-02-18 DIAGNOSIS — I6340 Cerebral infarction due to embolism of unspecified cerebral artery: Secondary | ICD-10-CM | POA: Diagnosis not present

## 2023-02-18 DIAGNOSIS — E119 Type 2 diabetes mellitus without complications: Secondary | ICD-10-CM | POA: Diagnosis not present

## 2023-02-18 DIAGNOSIS — I63411 Cerebral infarction due to embolism of right middle cerebral artery: Secondary | ICD-10-CM | POA: Diagnosis not present

## 2023-02-18 DIAGNOSIS — I5021 Acute systolic (congestive) heart failure: Secondary | ICD-10-CM | POA: Diagnosis not present

## 2023-02-18 DIAGNOSIS — I5022 Chronic systolic (congestive) heart failure: Secondary | ICD-10-CM | POA: Diagnosis not present

## 2023-02-18 DIAGNOSIS — Z1152 Encounter for screening for COVID-19: Secondary | ICD-10-CM | POA: Diagnosis not present

## 2023-02-18 DIAGNOSIS — I5043 Acute on chronic combined systolic (congestive) and diastolic (congestive) heart failure: Secondary | ICD-10-CM | POA: Diagnosis not present

## 2023-02-18 DIAGNOSIS — I462 Cardiac arrest due to underlying cardiac condition: Secondary | ICD-10-CM | POA: Diagnosis not present

## 2023-02-18 DIAGNOSIS — N170 Acute kidney failure with tubular necrosis: Secondary | ICD-10-CM | POA: Diagnosis not present

## 2023-02-18 DIAGNOSIS — D72829 Elevated white blood cell count, unspecified: Secondary | ICD-10-CM | POA: Diagnosis not present

## 2023-02-18 DIAGNOSIS — I609 Nontraumatic subarachnoid hemorrhage, unspecified: Secondary | ICD-10-CM | POA: Diagnosis not present

## 2023-02-18 DIAGNOSIS — J9601 Acute respiratory failure with hypoxia: Secondary | ICD-10-CM | POA: Diagnosis not present

## 2023-02-18 DIAGNOSIS — I4901 Ventricular fibrillation: Secondary | ICD-10-CM | POA: Diagnosis not present

## 2023-02-18 LAB — BASIC METABOLIC PANEL
Anion gap: 7 (ref 5–15)
BUN: 13 mg/dL (ref 6–20)
CO2: 25 mmol/L (ref 22–32)
Calcium: 9.8 mg/dL (ref 8.9–10.3)
Chloride: 103 mmol/L (ref 98–111)
Creatinine, Ser: 0.85 mg/dL (ref 0.44–1.00)
GFR, Estimated: >60 mL/min (ref >60)
Glucose, Bld: 103 mg/dL — ABNORMAL HIGH (ref 70–99)
Potassium: 3.3 mmol/L — ABNORMAL LOW (ref 3.5–5.1)
Sodium: 135 mmol/L (ref 135–145)

## 2023-02-18 LAB — COOXEMETRY PANEL
Carboxyhemoglobin: 2.7 % — ABNORMAL HIGH (ref 0.5–1.5)
Methemoglobin: 0.9 % (ref 0.0–1.5)
O2 Saturation: 73.4 %
Total hemoglobin: 11.9 g/dL — ABNORMAL LOW (ref 12.0–16.0)

## 2023-02-18 LAB — GLUCOSE, CAPILLARY
Glucose-Capillary: 91 mg/dL (ref 70–99)
Glucose-Capillary: 104 mg/dL — ABNORMAL HIGH (ref 70–99)
Glucose-Capillary: 91 mg/dL (ref 70–99)
Glucose-Capillary: 103 mg/dL — ABNORMAL HIGH (ref 70–99)

## 2023-02-18 MED ORDER — DAPAGLIFLOZIN PROPANEDIOL 10 MG PO TABS
10.0000 mg | ORAL_TABLET | Freq: Every day | ORAL | Status: DC
  Administered 2023-02-18 – 2023-02-20 (×3): 10 mg via ORAL
  Filled 2023-02-18 (×4): qty 1

## 2023-02-18 MED ORDER — APIXABAN 5 MG PO TABS
5.0000 mg | ORAL_TABLET | Freq: Two times a day (BID) | ORAL | Status: DC
  Administered 2023-02-18 – 2023-02-20 (×5): 5 mg via ORAL
  Filled 2023-02-18 (×5): qty 1

## 2023-02-18 MED ORDER — POTASSIUM CHLORIDE CRYS ER 20 MEQ PO TBCR
40.0000 meq | EXTENDED_RELEASE_TABLET | Freq: Once | ORAL | Status: AC
  Administered 2023-02-18: 40 meq via ORAL
  Filled 2023-02-18: qty 2

## 2023-02-18 NOTE — TOC Initial Note (Addendum)
Transition of Care [facility]) - Initial/Assessment Note    Patient Details  Name: Stephanie Reilly MRN: [id number] Date of Birth: 30-Jul-1971  Transition of Care [facility]) CM/SW Contact:    Elliot Cousin, RN Phone Number: [phone] 02/18/2023, 12:46 PM  Clinical Narrative:     1117 am HF TOC CM contacted [employer] rep, [place] Citizen with order for [employer] for home. Faxed orders, progress notes, test rests and facesheet to [employer].   Spoke to pt and states she will need RW and bedside commode at home. She was using her mother's DME at home. She has shower chair and bars in her bathroom. She has a scale at home and does her daily weights. Contacted [employer] for DME to be delivered to room.        Patient approved for Life Vest, will be fitted today around 630 pm. Updated HF team.          Expected Discharge Plan: Home/Self Care Barriers to Discharge: Continued Medical Work up   Patient Goals and CMS Choice Patient states their goals for this hospitalization and ongoing recovery are:: wants to remain independent CMS Medicare.gov Compare Post Acute Care list provided to:: Patient        Expected Discharge Plan and Services   Discharge Planning Services: CM Consult   Living arrangements for the past 2 months: Apartment                 DME Arranged: Life vest, Walker rolling, 3-N-1 DME Agency: Barrie Dunker Date DME Agency Contacted: 02/18/23 Time DME Agency Contacted: 1218 Representative spoke with at DME Agency: Luberta Robertson            Prior Living Arrangements/Services Living arrangements for the past 2 months: Apartment Lives with:: Parents, Self Patient language and need for interpreter reviewed:: Yes Do you feel safe going back to the place where you live?: Yes      Need for Family Participation in Patient Care: No (Comment) Care giver support system in place?: Yes (comment) Current home services: DME (scale, shower chair) Criminal Activity/Legal Involvement Pertinent to  Current Situation/Hospitalization: No - Comment as needed  Activities of Daily Living Home Assistive Devices/Equipment: None ADL Screening (condition at time of admission) Patient's cognitive ability adequate to safely complete daily activities?: Yes Is the patient deaf or have difficulty hearing?: No Does the patient have difficulty seeing, even when wearing glasses/contacts?: No Does the patient have difficulty concentrating, remembering, or making decisions?: No Patient able to express need for assistance with ADLs?: Yes Does the patient have difficulty dressing or bathing?: No Independently performs ADLs?: No Communication: Independent Dressing (OT): Needs assistance Is this a change from baseline?: Change from baseline, expected to last >3 days Grooming: Needs assistance Is this a change from baseline?: Change from baseline, expected to last >3 days Feeding: Needs assistance Is this a change from baseline?: Change from baseline, expected to last >3 days Bathing: Needs assistance Is this a change from baseline?: Change from baseline, expected to last >3 days Toileting: Needs assistance Is this a change from baseline?: Change from baseline, expected to last >3days In/Out Bed: Needs assistance Is this a change from baseline?: Change from baseline, expected to last >3 days Walks in Home: Needs assistance Is this a change from baseline?: Change from baseline, expected to last >3 days Does the patient have difficulty walking or climbing stairs?: Yes Weakness of Legs: Left Weakness of Arms/Hands: Left  Permission Sought/Granted Permission sought to share information with :  Case Manager, Family Supports, PCP Permission granted to share information with : Yes, Verbal Permission Granted  Share Information with NAME: Stav Delaroca     Permission granted to share info w Relationship: mother  Permission granted to share info w Contact Information: [phone]  Emotional Assessment        Orientation: : Oriented to Self, Oriented to Place, Oriented to  Time, Oriented to Situation   Psych Involvement: No (comment)  Admission diagnosis:  Stroke [facility]) [I63.9] Acute right MCA stroke (HCC) [I63.511] Patient Active Problem List   Diagnosis Date Noted   Stroke (HCC) 02/15/2023   Shock circulatory (HCC) 02/15/2023   Iron deficiency anemia 02/09/2023   Heart failure (HCC) 02/08/2023   Hypokalemia 02/08/2023   Acute on chronic systolic CHF (congestive heart failure) (HCC) 02/07/2023   Essential hypertension 02/07/2023   Diabetes mellitus type 2, diet-controlled (HCC) 02/07/2023   Elevated troponin 02/07/2023   Obesity, Class III, BMI 40-49.9 (morbid obesity) (HCC) 02/07/2023   PCP:  Inc, [facility]:   [facility] [phone] - [place], [place] - [address] [place] [address] Phone: [phone] Fax: [phone]  [facility] [address] [place] [address] Phone: [phone] Fax: [phone]     Social Determinants of Health (SDOH) Social History: SDOH Screenings   Food Insecurity: No Food Insecurity (02/15/2023)  Housing: Low Risk  (02/15/2023)  Transportation Needs: No Transportation Needs (02/15/2023)  Utilities: Not At Risk (02/15/2023)  Alcohol Screen: Low Risk  (02/11/2023)  Financial Resource Strain: Low Risk  (02/11/2023)  Tobacco Use: Medium Risk (02/17/2023)   SDOH Interventions:     Readmission Risk Interventions     No data to display

## 2023-02-18 NOTE — Telephone Encounter (Signed)
Pharmacy Patient Advocate Encounter  Insurance verification completed.    The patient is insured through [employer]   The patient is currently admitted and ran test claims for the following: Eliquis.  Copays and coinsurance results were relayed to Inpatient clinical team.  

## 2023-02-18 NOTE — Progress Notes (Addendum)
ANTICOAGULATION CONSULT NOTE  Pharmacy Consult for apixaban Indication: atrial fibrillation  Labs: Recent Labs    02/15/23 1245 02/15/23 1251 02/15/23 2020 02/16/23 0646 02/16/23 0945 02/17/23 0601  HGB 14.9   < > 13.7 14.3 12.6 11.6*  HCT 46.9*   < > 44.1 42.0 40.2 37.5  PLT 337  --  310  --  246 207  LABPROT 14.0  --  15.2  --   --   --   INR 1.1  --  1.2  --   --   --   CREATININE 1.07*   < > 1.46*  --  1.17* 0.85   < > = values in this interval not displayed.    Assessment: 9 yof presenting with multifocal embolic stroke with R M2 occlusion s/p IR with small SAH, etiology likely due to cardiomyopathy with low EF. Patient initially started on VTE prophylaxis with Lovenox on 5/12 (last dose 5/13) and aspirin 325mg  daily on 5/13. Now Pharmacy consulted to transition to therapeutic anticoagulation with apixaban today and d/c aspirin per stroke team. Hg down to 11.6, plt WNL. AKI resolving - SCr down to <1 (last labs 5/13). No active bleed issues reported.  Goal of Therapy:  Stroke prevention Monitor platelets by anticoagulation protocol: Yes   Plan:  D/c Lovenox prophylaxis and aspirin per stroke team Start apixaban 5mg  PO BID Monitor CBC, s/sx bleeding   Leia Alf, PharmD, BCPS Clinical Pharmacist 02/18/2023 8:47 AM

## 2023-02-18 NOTE — Progress Notes (Signed)
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 Tommie Sams   Subjective:  Patient sitting in bed, NAD, family members at bedside.  The only complaint is pain in right forehead, she thinks it was caused by the head holder from MRI.  She has small abrasion in her right forehead.   Allergies: Paroxetine hcl, Prednisone, Chocolate, Ibuprofen, Iron, Latex, Lisinopril, Naproxen, Tramadol, Tylenol [acetaminophen], Penicillins, and Serotonin  Medications: Prior to Admission medications   Medication Sig Start Date End Date Taking? Authorizing Provider  acetaminophen (TYLENOL) 500 MG tablet Take 1,000 mg by mouth as needed for moderate pain.   Yes [provider]  Ascorbic Acid (VITAMIN C) 100 MG tablet Take 100 mg by mouth daily.   Yes [provider]  aspirin 81 MG chewable tablet Chew 1 tablet (81 mg total) by mouth daily. 02/13/23  Yes Zannie Cove, MD  Biotin (BIOTIN MAXIMUM STRENGTH) 10 MG TABS Take 10 mg by mouth daily. 08/04/17  Yes [provider]  cetirizine (ZYRTEC) 10 MG tablet Take 10 mg by mouth daily.   Yes [provider]  dapagliflozin propanediol (FARXIGA) 10 MG TABS tablet Take 1 tablet (10 mg total) by mouth daily. 02/13/23  Yes Zannie Cove, MD  metoprolol succinate (TOPROL-XL) 50 MG 24 hr tablet Take 1 tablet (50 mg total) by mouth daily. Take with or immediately following a meal. 02/13/23  Yes Zannie Cove, MD  Multiple Vitamins-Minerals (HAIR SKIN & NAILS PO) Take 2 tablets by mouth daily.   Yes [provider]  sacubitril-valsartan (ENTRESTO) 24-26 MG Take 1 tablet by mouth 2 (two) times daily. 02/12/23  Yes Zannie Cove, MD   spironolactone (ALDACTONE) 25 MG tablet Take 1 tablet (25 mg total) by mouth daily. 02/13/23  Yes Zannie Cove, MD  torsemide (DEMADEX) 20 MG tablet Take 2 tablets (40 mg total) by mouth daily. 02/13/23  Yes Zannie Cove, MD  Vitamin E 45 MG (100 UNIT) CAPS Take 100 Units by mouth daily. 08/04/17  Yes [provider]     Vital Signs: BP 118/86   Pulse (!) 106   Temp 98 F (36.7 C) (Oral)   Resp (!) 23   Ht 5\' 2"  (1.575 m)   Wt 245 lb 9.5 oz (111.4 kg)   SpO2 95%   BMI 44.92 kg/m   Physical Exam Vitals reviewed.  Constitutional:      General: She is not in acute distress. Cardiovascular:     Pulses: Normal pulses.  Pulmonary:     Effort: Pulmonary effort is normal.  Abdominal:     General: Abdomen is flat.     Palpations: Abdomen is soft.  Skin:    General: Skin is warm and dry.     Coloration: Skin is not jaundiced.     Findings: No bruising.  Neurological:     Mental Status: She is alert.  Psychiatric:        Mood and Affect: Mood normal.        Behavior: Behavior normal.        Thought Content: Thought content normal.        Judgment: Judgment normal.   Alert, awake, and oriented x4 Speech and comprehension intact Distal pulses palpable  Right radial puncture site soft, non-tender.    Imaging: IR PERCUTANEOUS ART THROMBECTOMY/INFUSION INTRACRANIAL INC DIAG ANGIO  Result Date: 02/17/2023 INDICATION: 52 year old female who presented to ED with slurred speech and left-sided facial droop, NIHSS 2. Her last known well was 12:30 p.m. on 02/15/2023. Patient had complete resolution of symptoms while evaluated by neurology. However, fluctuating left-sided weakness was noted, improved while lying flat. Head CT showed no evidence of an acute territorial infarct or hemorrhage. CT angiogram of the head and neck showed a right M2/MCA occlusion. Due to recurrence of symptoms, risks and benefits of proceeding with intervention were discussed with the patient and her  family. They elected to proceed with mechanical thrombectomy. EXAM: ULTRASOUND-GUIDED VASCULAR ACCESS DIAGNOSTIC CEREBRAL ANGIOGRAM MECHANICAL THROMBECTOMY FLAT PANEL HEAD CT COMPARISON:  CT/CT angiogram of the head and Feb 15, 2023. MEDICATIONS: Refer to anesthesia documentation. ANESTHESIA/SEDATION: The procedure was performed under general anesthesia. CONTRAST:  75 mL of Omnipaque 300 mg/mL FLUOROSCOPY: Radiation Exposure Index (as provided by the fluoroscopic device): Peak skin dose 1542 mGy COMPLICATIONS: None immediate. TECHNIQUE: Informed written consent was obtained from the patient after a thorough discussion of the procedural risks, benefits and alternatives. All questions were addressed. Maximal Sterile Barrier Technique was utilized including caps, mask, sterile gowns, sterile gloves, sterile drape, hand hygiene and skin antiseptic. A timeout was performed prior to the initiation of the procedure. After anesthesia induction, patient developed profound hypotension requiring CPR. Normal pulse palpated after 2 minutes. Using the modified Seldinger technique and a micropuncture kit, access was gained to the right radial artery at the wrist and a 7 French sheath was placed. Real-time ultrasound guidance was utilized for vascular access including the acquisition of a permanent ultrasound image documenting patency of the accessed vessel. Then, a right radial artery angiogram was obtained via sheath side port. Normal brachial artery branching pattern seen. No significant anatomical variation. The right radial artery caliber is adequate for vascular access. Next, a 7 [place] Rist catheter was navigated over a 6 [place] Berenstein 2 catheter and a 0.035 "Terumo Glidewire into the right subclavian artery under fluoroscopic guidance. The catheter was then placed into the right common carotid artery and then advanced into the right internal carotid artery. Frontal and lateral angiograms of the head were obtained.  FINDINGS: 1. Patent right radial artery with standard anatomy. 2. There is an occlusion of the right M2/MCA dominant superior division branch. PROCEDURE: Using biplane roadmap guidance, a FreeClimb 54 aspiration catheter was navigated over Tenzing delivery catheter into the cavernous segment of the right ICA. The aspiration catheter was then advanced to the level of occlusion and connected to an aspiration pump. Continuous aspiration was performed for 2 minutes. The guide catheter was connected to a VacLok syringe. The aspiration catheter was subsequently removed under constant aspiration. The guide catheter was aspirated for debris. Right internal carotid artery angiograms with frontal and lateral views of the head showed recanalization of the M2 segment with occlusion of 2 M3 segments. Using biplane roadmap guidance, a FreeClimb 54 aspiration catheter was navigated over a phenom 21 microcatheter and an Aristotle 14 microguidewire into the cavernous segment of the right ICA. The microcatheter was then navigated over the wire into the right M3/MCA. Then, a 3 mm solitaire stent retriever was deployed spanning the M2-M3 segment. The device was allowed to intercalated with the clot for 4 minutes. The microcatheter was removed. The aspiration catheter was advanced to the level of occlusion and connected to an aspiration pump. The thrombectomy device  and aspiration catheter were removed under constant aspiration. The guide catheter was aspirated for debris. Right internal carotid artery angiograms with frontal lateral views of head showed recanalization of the M3 segment with nonocclusive residual filling defect. Using biplane roadmap guidance, a FreeClimb 54 aspiration catheter was navigated over a phenom 21 microcatheter and an Aristotle 14 microguidewire into the cavernous segment of the right ICA. The microcatheter was then navigated over the wire into the right M3/MCA. Then, a 3 mm solitaire stent retriever was  deployed spanning the M2-M3 segment. The device was allowed to intercalated with the clot for 4 minutes. The microcatheter was removed. The aspiration catheter was advanced to the level of occlusion and connected to an aspiration pump. The thrombectomy device and aspiration catheter were removed under constant aspiration. The guide catheter was aspirated for debris. Right internal carotid artery angiograms with frontal lateral views of head showed recanalization of the addressed M3 segment with persistent occlusion of a distal M3/MCA in the right frontal region with adequate retrograde collateral flow. The catheter was subsequently withdrawn. Flat panel CT of the head was obtained and post processed in a separate workstation with concurrent attending physician supervision. Selected images were sent to PACS. Trace hyperdensity in the right sylvian fissure suggesting minor contrast staining versus subarachnoid hemorrhage. An inflatable band was placed and inflated over the right wrist access site. The vascular sheath was withdrawn and the band was slowly deflated until brisk flow was noted through the arteriotomy site. At this point, the band was reinflated with additional 4 cc of air to obtain patent hemostasis. IMPRESSION: Successful mechanical thrombectomy for treatment of a proximal and dominant right M2/MCA superior division branch achieving TICI 2B recanalization. PLAN: Patient transferred to ICU for continued management. Electronically Signed   By: Baldemar Lenis M.D.   On: 02/17/2023 15:54   IR CT Head Ltd  Result Date: 02/17/2023 INDICATION: 52 year old female who presented to ED with slurred speech and left-sided facial droop, NIHSS 2. Her last known well was 12:30 p.m. on 02/15/2023. Patient had complete resolution of symptoms while evaluated by neurology. However, fluctuating left-sided weakness was noted, improved while lying flat. Head CT showed no evidence of an acute territorial  infarct or hemorrhage. CT angiogram of the head and neck showed a right M2/MCA occlusion. Due to recurrence of symptoms, risks and benefits of proceeding with intervention were discussed with the patient and her family. They elected to proceed with mechanical thrombectomy. EXAM: ULTRASOUND-GUIDED VASCULAR ACCESS DIAGNOSTIC CEREBRAL ANGIOGRAM MECHANICAL THROMBECTOMY FLAT PANEL HEAD CT COMPARISON:  CT/CT angiogram of the head and Feb 15, 2023. MEDICATIONS: Refer to anesthesia documentation. ANESTHESIA/SEDATION: The procedure was performed under general anesthesia. CONTRAST:  75 mL of Omnipaque 300 mg/mL FLUOROSCOPY: Radiation Exposure Index (as provided by the fluoroscopic device): Peak skin dose 1542 mGy COMPLICATIONS: None immediate. TECHNIQUE: Informed written consent was obtained from the patient after a thorough discussion of the procedural risks, benefits and alternatives. All questions were addressed. Maximal Sterile Barrier Technique was utilized including caps, mask, sterile gowns, sterile gloves, sterile drape, hand hygiene and skin antiseptic. A timeout was performed prior to the initiation of the procedure. After anesthesia induction, patient developed profound hypotension requiring CPR. Normal pulse palpated after 2 minutes. Using the modified Seldinger technique and a micropuncture kit, access was gained to the right radial artery at the wrist and a 7 French sheath was placed. Real-time ultrasound guidance was utilized for vascular access including the acquisition of a permanent ultrasound image documenting  patency of the accessed vessel. Then, a right radial artery angiogram was obtained via sheath side port. Normal brachial artery branching pattern seen. No significant anatomical variation. The right radial artery caliber is adequate for vascular access. Next, a 7 [place] Rist catheter was navigated over a 6 [place] Berenstein 2 catheter and a 0.035 "Terumo Glidewire into the right subclavian artery  under fluoroscopic guidance. The catheter was then placed into the right common carotid artery and then advanced into the right internal carotid artery. Frontal and lateral angiograms of the head were obtained. FINDINGS: 1. Patent right radial artery with standard anatomy. 2. There is an occlusion of the right M2/MCA dominant superior division branch. PROCEDURE: Using biplane roadmap guidance, a FreeClimb 54 aspiration catheter was navigated over Tenzing delivery catheter into the cavernous segment of the right ICA. The aspiration catheter was then advanced to the level of occlusion and connected to an aspiration pump. Continuous aspiration was performed for 2 minutes. The guide catheter was connected to a VacLok syringe. The aspiration catheter was subsequently removed under constant aspiration. The guide catheter was aspirated for debris. Right internal carotid artery angiograms with frontal and lateral views of the head showed recanalization of the M2 segment with occlusion of 2 M3 segments. Using biplane roadmap guidance, a FreeClimb 54 aspiration catheter was navigated over a phenom 21 microcatheter and an Aristotle 14 microguidewire into the cavernous segment of the right ICA. The microcatheter was then navigated over the wire into the right M3/MCA. Then, a 3 mm solitaire stent retriever was deployed spanning the M2-M3 segment. The device was allowed to intercalated with the clot for 4 minutes. The microcatheter was removed. The aspiration catheter was advanced to the level of occlusion and connected to an aspiration pump. The thrombectomy device and aspiration catheter were removed under constant aspiration. The guide catheter was aspirated for debris. Right internal carotid artery angiograms with frontal lateral views of head showed recanalization of the M3 segment with nonocclusive residual filling defect. Using biplane roadmap guidance, a FreeClimb 54 aspiration catheter was navigated over a phenom 21  microcatheter and an Aristotle 14 microguidewire into the cavernous segment of the right ICA. The microcatheter was then navigated over the wire into the right M3/MCA. Then, a 3 mm solitaire stent retriever was deployed spanning the M2-M3 segment. The device was allowed to intercalated with the clot for 4 minutes. The microcatheter was removed. The aspiration catheter was advanced to the level of occlusion and connected to an aspiration pump. The thrombectomy device and aspiration catheter were removed under constant aspiration. The guide catheter was aspirated for debris. Right internal carotid artery angiograms with frontal lateral views of head showed recanalization of the addressed M3 segment with persistent occlusion of a distal M3/MCA in the right frontal region with adequate retrograde collateral flow. The catheter was subsequently withdrawn. Flat panel CT of the head was obtained and post processed in a separate workstation with concurrent attending physician supervision. Selected images were sent to PACS. Trace hyperdensity in the right sylvian fissure suggesting minor contrast staining versus subarachnoid hemorrhage. An inflatable band was placed and inflated over the right wrist access site. The vascular sheath was withdrawn and the band was slowly deflated until brisk flow was noted through the arteriotomy site. At this point, the band was reinflated with additional 4 cc of air to obtain patent hemostasis. IMPRESSION: Successful mechanical thrombectomy for treatment of a proximal and dominant right M2/MCA superior division branch achieving TICI 2B recanalization. PLAN: Patient transferred to  ICU for continued management. Electronically Signed   By: Baldemar Lenis M.D.   On: 02/17/2023 15:54   IR US Guide Vasc Access Right  Result Date: 02/17/2023 INDICATION: 52 year old female who presented to ED with slurred speech and left-sided facial droop, NIHSS 2. Her last known well was 12:30  p.m. on 02/15/2023. Patient had complete resolution of symptoms while evaluated by neurology. However, fluctuating left-sided weakness was noted, improved while lying flat. Head CT showed no evidence of an acute territorial infarct or hemorrhage. CT angiogram of the head and neck showed a right M2/MCA occlusion. Due to recurrence of symptoms, risks and benefits of proceeding with intervention were discussed with the patient and her family. They elected to proceed with mechanical thrombectomy. EXAM: ULTRASOUND-GUIDED VASCULAR ACCESS DIAGNOSTIC CEREBRAL ANGIOGRAM MECHANICAL THROMBECTOMY FLAT PANEL HEAD CT COMPARISON:  CT/CT angiogram of the head and Feb 15, 2023. MEDICATIONS: Refer to anesthesia documentation. ANESTHESIA/SEDATION: The procedure was performed under general anesthesia. CONTRAST:  75 mL of Omnipaque 300 mg/mL FLUOROSCOPY: Radiation Exposure Index (as provided by the fluoroscopic device): Peak skin dose 1542 mGy COMPLICATIONS: None immediate. TECHNIQUE: Informed written consent was obtained from the patient after a thorough discussion of the procedural risks, benefits and alternatives. All questions were addressed. Maximal Sterile Barrier Technique was utilized including caps, mask, sterile gowns, sterile gloves, sterile drape, hand hygiene and skin antiseptic. A timeout was performed prior to the initiation of the procedure. After anesthesia induction, patient developed profound hypotension requiring CPR. Normal pulse palpated after 2 minutes. Using the modified Seldinger technique and a micropuncture kit, access was gained to the right radial artery at the wrist and a 7 French sheath was placed. Real-time ultrasound guidance was utilized for vascular access including the acquisition of a permanent ultrasound image documenting patency of the accessed vessel. Then, a right radial artery angiogram was obtained via sheath side port. Normal brachial artery branching pattern seen. No significant anatomical  variation. The right radial artery caliber is adequate for vascular access. Next, a 7 [place] Rist catheter was navigated over a 6 [place] Berenstein 2 catheter and a 0.035 "Terumo Glidewire into the right subclavian artery under fluoroscopic guidance. The catheter was then placed into the right common carotid artery and then advanced into the right internal carotid artery. Frontal and lateral angiograms of the head were obtained. FINDINGS: 1. Patent right radial artery with standard anatomy. 2. There is an occlusion of the right M2/MCA dominant superior division branch. PROCEDURE: Using biplane roadmap guidance, a FreeClimb 54 aspiration catheter was navigated over Tenzing delivery catheter into the cavernous segment of the right ICA. The aspiration catheter was then advanced to the level of occlusion and connected to an aspiration pump. Continuous aspiration was performed for 2 minutes. The guide catheter was connected to a VacLok syringe. The aspiration catheter was subsequently removed under constant aspiration. The guide catheter was aspirated for debris. Right internal carotid artery angiograms with frontal and lateral views of the head showed recanalization of the M2 segment with occlusion of 2 M3 segments. Using biplane roadmap guidance, a FreeClimb 54 aspiration catheter was navigated over a phenom 21 microcatheter and an Aristotle 14 microguidewire into the cavernous segment of the right ICA. The microcatheter was then navigated over the wire into the right M3/MCA. Then, a 3 mm solitaire stent retriever was deployed spanning the M2-M3 segment. The device was allowed to intercalated with the clot for 4 minutes. The microcatheter was removed. The aspiration catheter was advanced to the level of  occlusion and connected to an aspiration pump. The thrombectomy device and aspiration catheter were removed under constant aspiration. The guide catheter was aspirated for debris. Right internal carotid artery  angiograms with frontal lateral views of head showed recanalization of the M3 segment with nonocclusive residual filling defect. Using biplane roadmap guidance, a FreeClimb 54 aspiration catheter was navigated over a phenom 21 microcatheter and an Aristotle 14 microguidewire into the cavernous segment of the right ICA. The microcatheter was then navigated over the wire into the right M3/MCA. Then, a 3 mm solitaire stent retriever was deployed spanning the M2-M3 segment. The device was allowed to intercalated with the clot for 4 minutes. The microcatheter was removed. The aspiration catheter was advanced to the level of occlusion and connected to an aspiration pump. The thrombectomy device and aspiration catheter were removed under constant aspiration. The guide catheter was aspirated for debris. Right internal carotid artery angiograms with frontal lateral views of head showed recanalization of the addressed M3 segment with persistent occlusion of a distal M3/MCA in the right frontal region with adequate retrograde collateral flow. The catheter was subsequently withdrawn. Flat panel CT of the head was obtained and post processed in a separate workstation with concurrent attending physician supervision. Selected images were sent to PACS. Trace hyperdensity in the right sylvian fissure suggesting minor contrast staining versus subarachnoid hemorrhage. An inflatable band was placed and inflated over the right wrist access site. The vascular sheath was withdrawn and the band was slowly deflated until brisk flow was noted through the arteriotomy site. At this point, the band was reinflated with additional 4 cc of air to obtain patent hemostasis. IMPRESSION: Successful mechanical thrombectomy for treatment of a proximal and dominant right M2/MCA superior division branch achieving TICI 2B recanalization. PLAN: Patient transferred to ICU for continued management. Electronically Signed   By: Baldemar Lenis  M.D.   On: 02/17/2023 15:54   MR BRAIN WO CONTRAST  Result Date: 02/17/2023 CLINICAL DATA:  Stroke, follow up EXAM: MRI HEAD WITHOUT CONTRAST MRA HEAD WITHOUT CONTRAST TECHNIQUE: Multiplanar, multi-echo pulse sequences of the brain and surrounding structures were acquired without intravenous contrast. Angiographic images of the Circle of Willis were acquired using MRA technique without intravenous contrast. COMPARISON:  None Available. FINDINGS: MRI HEAD FINDINGS Brain: Small acute infarcts in the right frontal and parietal lobes, largest in the right frontal lobe laterally. A few acute punctate infarcts in the occipital lobes bilaterally and right cerebellum. No mass occupying acute hemorrhage, mass lesion or midline shift. No hydrocephalus. Vascular: See below. Skull and upper cervical spine: Normal marrow signal. Sinuses/Orbits: No acute or significant finding. Other: No mastoid effusions. MRA HEAD FINDINGS Anterior circulation: Bilateral intracranial ICAs, MCAs, and ACAs are patent without proximal hemodynamically significant stenosis. Posterior circulation: Bilateral intradural vertebral arteries, basilar artery and bilateral posterior cerebral arteries IMPRESSION: 1. Small acute infarcts in the right frontal and parietal lobes. 2. A few additional acute punctate infarcts in the occipital lobes bilaterally and right cerebellum. 3. No emergent large vessel occlusion or proximal hemodynamically significant stenosis. Electronically Signed   By: Feliberto Harts M.D.   On: 02/17/2023 15:21   MR ANGIO HEAD WO CONTRAST  Result Date: 02/17/2023 CLINICAL DATA:  Stroke, follow up EXAM: MRI HEAD WITHOUT CONTRAST MRA HEAD WITHOUT CONTRAST TECHNIQUE: Multiplanar, multi-echo pulse sequences of the brain and surrounding structures were acquired without intravenous contrast. Angiographic images of the Circle of Willis were acquired using MRA technique without intravenous contrast. COMPARISON:  None Available.  FINDINGS: MRI HEAD FINDINGS Brain: Small acute infarcts in the right frontal and parietal lobes, largest in the right frontal lobe laterally. A few acute punctate infarcts in the occipital lobes bilaterally and right cerebellum. No mass occupying acute hemorrhage, mass lesion or midline shift. No hydrocephalus. Vascular: See below. Skull and upper cervical spine: Normal marrow signal. Sinuses/Orbits: No acute or significant finding. Other: No mastoid effusions. MRA HEAD FINDINGS Anterior circulation: Bilateral intracranial ICAs, MCAs, and ACAs are patent without proximal hemodynamically significant stenosis. Posterior circulation: Bilateral intradural vertebral arteries, basilar artery and bilateral posterior cerebral arteries IMPRESSION: 1. Small acute infarcts in the right frontal and parietal lobes. 2. A few additional acute punctate infarcts in the occipital lobes bilaterally and right cerebellum. 3. No emergent large vessel occlusion or proximal hemodynamically significant stenosis. Electronically Signed   By: Feliberto Harts M.D.   On: 02/17/2023 15:21   DG CHEST PORT 1 VIEW  Result Date: 02/17/2023 CLINICAL DATA:  Acute stroke. EXAM: PORTABLE CHEST 1 VIEW COMPARISON:  02/16/2023 FINDINGS: Stable cardiac enlargement. Interval extubation. Interval placement of right upper extremity PICC line with the catheter tip in the SVC. Improved pulmonary aeration bilaterally with bibasilar atelectasis remaining, right greater than left. No overt pulmonary edema, pleural effusions or pneumothorax. IMPRESSION: Improved pulmonary aeration bilaterally with bibasilar atelectasis remaining, right greater than left. Interval extubation. PICC line tip in SVC. Electronically Signed   By: Irish Lack M.D.   On: 02/17/2023 08:18   ECHOCARDIOGRAM COMPLETE  Result Date: 02/16/2023    ECHOCARDIOGRAM REPORT   Patient Name:   Stephanie Reilly Date of Exam: 02/16/2023 Medical Rec #:  [id number]     Height:       62.0 in Accession  #:    [id number]    Weight:       245.6 lb Date of Birth:  [birth year]     BSA:          2.086 m Patient Age:    51 years      BP:           112/86 mmHg Patient Gender: F             HR:           94 bpm. Exam Location:  Inpatient Procedure: 2D Echo, Cardiac Doppler and Color Doppler Indications:    CHF-Acute Systolic I50.21  History:        Patient has prior history of Echocardiogram examinations, most                 recent 02/07/2023. CHF, Stroke and Shock; Risk                 Factors:Hypertension, Diabetes and Former Smoker.  Sonographer:    Celesta Gentile RCS Referring Phys: [id number] [facility]  Sonographer Comments: Patient on mechanical ventilator during echo exam. IMPRESSIONS  1. Left ventricular ejection fraction, by estimation, is <20%. The left ventricle has severely decreased function. The left ventricle demonstrates global hypokinesis. The left ventricular internal cavity size was severely dilated.  2. Right ventricular systolic function is mildly reduced. The right ventricular size is normal.  3. Left atrial size was moderately dilated.  4. Mild mitral valve regurgitation.  5. The aortic valve is tricuspid. Aortic valve regurgitation is not visualized.  6. The inferior vena cava is normal in size with greater than 50% respiratory variability, suggesting right atrial pressure of 3 mmHg. Comparison(s): The left ventricular function is unchanged. FINDINGS  Left Ventricle: Left ventricular  ejection fraction, by estimation, is <20%. The left ventricle has severely decreased function. The left ventricle demonstrates global hypokinesis. The left ventricular internal cavity size was severely dilated. There is no left ventricular hypertrophy. Right Ventricle: The right ventricular size is normal. Right vetricular wall thickness was not assessed. Right ventricular systolic function is mildly reduced. Left Atrium: Left atrial size was moderately dilated. Right Atrium: Right atrial size was normal in size.  Pericardium: Trivial pericardial effusion is present. Mitral Valve: There is mild thickening of the mitral valve leaflet(s). There is mild calcification of the mitral valve leaflet(s). Mild mitral valve regurgitation. Tricuspid Valve: The tricuspid valve is normal in structure. Tricuspid valve regurgitation is trivial. Aortic Valve: The aortic valve is tricuspid. Aortic valve regurgitation is not visualized. Pulmonic Valve: The pulmonic valve was normal in structure. Pulmonic valve regurgitation is trivial. Aorta: The aortic root is normal in size and structure. Venous: The inferior vena cava is normal in size with greater than 50% respiratory variability, suggesting right atrial pressure of 3 mmHg. IAS/Shunts: No atrial level shunt detected by color flow Doppler.  LEFT VENTRICLE PLAX 2D LVIDd:         6.70 cm LVIDs:         5.90 cm LV PW:         1.00 cm LV IVS:        0.90 cm LVOT diam:     1.90 cm LV SV:         41 LV SV Index:   20 LVOT Area:     2.84 cm  LV Volumes (MOD) LV vol d, MOD A2C: 196.0 ml LV vol d, MOD A4C: 237.0 ml LV vol s, MOD A2C: 127.0 ml LV vol s, MOD A4C: 175.0 ml LV SV MOD A2C:     69.0 ml LV SV MOD A4C:     237.0 ml LV SV MOD BP:      69.2 ml RIGHT VENTRICLE TAPSE (M-mode): 2.2 cm LEFT ATRIUM              Index        RIGHT ATRIUM           Index LA diam:        3.90 cm  1.87 cm/m   RA Area:     23.10 cm LA Vol (A2C):   72.5 ml  34.75 ml/m  RA Volume:   70.40 ml  33.75 ml/m LA Vol (A4C):   115.0 ml 55.13 ml/m LA Biplane Vol: 91.7 ml  43.96 ml/m  AORTIC VALVE LVOT Vmax:   105.30 cm/s LVOT Vmean:  76.667 cm/s LVOT VTI:    0.144 m  AORTA Ao Root diam: 3.20 cm MITRAL VALVE MV Area (PHT): 6.96 cm     SHUNTS MV Decel Time: 109 msec     Systemic VTI:  0.14 m MV E velocity: 103.00 cm/s  Systemic Diam: 1.90 cm Dietrich Pates MD Electronically signed by Dietrich Pates MD Signature Date/Time: 02/16/2023/4:05:08 PM    Final    DG CHEST PORT 1 VIEW  Result Date: 02/16/2023 CLINICAL DATA:  ETT EXAM:  PORTABLE CHEST 1 VIEW COMPARISON:  Feb 15, 2023 FINDINGS: The ETT is in good position. No pneumothorax. The lungs remain clear. Stable cardiomegaly. The hila and mediastinum are unchanged. No other acute abnormalities are identified. IMPRESSION: The ETT is in good position. No other interval changes. Electronically Signed   By: Gerome Sam III M.D.   On: 02/16/2023 08:56   [place]  EKG SITE RITE  Result Date: 02/16/2023 If Site Rite image not attached, placement could not be confirmed due to current cardiac rhythm.  DG CHEST PORT 1 VIEW  Result Date: 02/15/2023 CLINICAL DATA:  Check central line placement EXAM: PORTABLE CHEST 1 VIEW COMPARISON:  02/07/2023 FINDINGS: New right jugular central line is noted with the catheter tip in the region of the right innominate vein. Endotracheal tube is noted in satisfactory position. Cardiac shadow is enlarged but stable. The lungs are well aerated. Mild central vascular congestion is seen. No bony abnormality is noted. No pneumothorax is seen. IMPRESSION: No pneumothorax following central line placement. The central line tip is in the region of the right innominate vein. Mild vascular congestion. Electronically Signed   By: Alcide Clever M.D.   On: 02/15/2023 19:28   CT ANGIO HEAD NECK W WO CM  Result Date: 02/15/2023 CLINICAL DATA:  Provided history: Transient ischemic attack. Syncope, left-sided facial droop. EXAM: CT ANGIOGRAPHY HEAD AND NECK WITH AND WITHOUT CONTRAST TECHNIQUE: Multidetector CT imaging of the head and neck was performed using the standard protocol during bolus administration of intravenous contrast. Multiplanar CT image reconstructions and MIPs were obtained to evaluate the vascular anatomy. Carotid stenosis measurements (when applicable) are obtained utilizing NASCET criteria, using the distal internal carotid diameter as the denominator. RADIATION DOSE REDUCTION: This exam was performed according to the departmental dose-optimization program  which includes automated exposure control, adjustment of the mA and/or kV according to patient size and/or use of iterative reconstruction technique. CONTRAST:  Administered contrast not known at this time. COMPARISON:  Head CT performed earlier today 02/15/2023. FINDINGS: CTA NECK FINDINGS Aortic arch: Standard aortic branching. Mild sclerotic plaque within the visualized aortic arch. No hemodynamically significant innominate or proximal subclavian artery stenosis. Right carotid system: CCA and ICA patent within the neck without stenosis or significant atherosclerotic disease. Tortuosity of the cervical ICA. Left carotid system: CCA and ICA patent within the neck without stenosis or significant atherosclerotic disease. Tortuosity of the cervical ICA. Vertebral arteries: Patent within the neck without stenosis or significant atherosclerotic disease. The left vertebral artery is slightly dominant. Skeleton: No acute fracture. Other neck: No neck mass or cervical lymphadenopathy. Upper chest: No consolidation within the imaged lung apices. 10 mm cyst within the right upper lobe. Review of the MIP images confirms the above findings CTA HEAD FINDINGS Anterior circulation: The intracranial internal carotid arteries are patent. Nonstenotic atherosclerotic plaque within the left cavernous segment. The M1 middle cerebral arteries are patent. Abrupt occlusion of a proximal M2 right MCA vessel (series 12, image 44) (series 11, image 17). No left M2 proximal branch occlusion or high-grade proximal stenosis is identified. The anterior cerebral arteries are patent. Posterior circulation: The intracranial vertebral arteries are patent. The basilar artery is patent. The posterior cerebral arteries are patent. A right posterior communicating artery is present. The left posterior communicating artery is diminutive or absent. Venous sinuses: Within the limitations of contrast timing, no convincing thrombus. Anatomic variants: As  described. Review of the MIP images confirms the above findings CTA head impression #1 called by telephone at the time of interpretation on 02/15/2023 at 1:29 pm to provider Gloris Manchester , who verbally acknowledged these results. IMPRESSION: CTA neck: 1. The common carotid, internal carotid and vertebral arteries are patent within the neck without stenosis or significant atherosclerotic disease. 2.  Aortic Atherosclerosis (ICD10-I70.0). CTA head: 1. Abrupt occlusion of a proximal M2 right middle cerebral artery vessel. Neuro-interventional consultation recommended. 2. Mild non-stenotic  atherosclerotic plaque within the cavernous left eye Electronically Signed   By: Jackey Loge D.O.   On: 02/15/2023 13:33   CT HEAD CODE STROKE WO CONTRAST  Result Date: 02/15/2023 CLINICAL DATA:  Code stroke. Neuro deficit, acute, stroke suspected. Syncope. Left-sided facial droop. EXAM: CT HEAD WITHOUT CONTRAST TECHNIQUE: Contiguous axial images were obtained from the base of the skull through the vertex without intravenous contrast. RADIATION DOSE REDUCTION: This exam was performed according to the departmental dose-optimization program which includes automated exposure control, adjustment of the mA and/or kV according to patient size and/or use of iterative reconstruction technique. COMPARISON:  Report from head CT 05/11/2001 (images unavailable) FINDINGS: Brain: Cerebral volume is normal. A small chronic lacunar infarct is questioned within the left corona radiata (for instance as seen on series 3, image 19) (series 5, image 32). There is no acute intracranial hemorrhage. No demarcated cortical infarct. No extra-axial fluid collection. No evidence of an intracranial mass. No midline shift. Vascular: No hyperdense vessel. Skull: No fracture or aggressive osseous lesion. Sinuses/Orbits: No mass or acute finding within the imaged orbits. No significant paranasal sinus disease at the imaged levels. ASPECTS [facility] Stroke Program  Early CT Score) - Ganglionic level infarction (caudate, lentiform nuclei, internal capsule, insula, M1-M3 cortex): 7 - Supraganglionic infarction (M4-M6 cortex): 3 Total score (0-10 with 10 being normal): 10 These results were communicated to Dr. Roda Shutters At 1:04 pmon 5/11/2024by text page via the [facility] messaging system. IMPRESSION: 1. No evidence of an acute intracranial abnormality. ASPECTS is 10. 2. Possible small chronic lacunar infarct within the left corona radiata. Electronically Signed   By: Jackey Loge D.O.   On: 02/15/2023 13:15    Labs:  CBC: Recent Labs    02/15/23 1245 02/15/23 1251 02/15/23 2020 02/16/23 0646 02/16/23 0945 02/17/23 0601  WBC 11.0*  --  21.1*  --  17.6* 12.8*  HGB 14.9   < > 13.7 14.3 12.6 11.6*  HCT 46.9*   < > 44.1 42.0 40.2 37.5  PLT 337  --  310  --  246 207   < > = values in this interval not displayed.     COAGS: Recent Labs    02/07/23 1738 02/07/23 2001 02/15/23 1245 02/15/23 2020  INR 1.2 1.3* 1.1 1.2  APTT  --  41*  --   --      BMP: Recent Labs    02/15/23 1245 02/15/23 1251 02/15/23 1540 02/15/23 2020 02/16/23 0646 02/16/23 0945 02/17/23 0601  NA 134* 136   < > 140 143 140 133*  K 3.9 3.9   < > 3.4* 3.4* 3.3* 3.8  CL 98 103  --  101  --  102 102  CO2 22  --   --  24  --  25 26  GLUCOSE 134* 137*  --  318*  --  181* 116*  BUN 21* 23*  --  21*  --  20 11  CALCIUM 9.7  --   --  8.8*  --  9.6 9.5  CREATININE 1.07* 1.00  --  1.46*  --  1.17* 0.85  GFRNONAA >60  --   --  43*  --  56* >60   < > = values in this interval not displayed.     LIVER FUNCTION TESTS: Recent Labs    02/15/23 1245 02/15/23 2020 02/16/23 0945 02/17/23 0601  BILITOT 0.5 0.5 0.6 1.0  AST 20 29 21 17   ALT 24 30 24 21   ALKPHOS 81  75 66 65  PROT 8.8* 8.1 7.7 7.0  ALBUMIN 3.6 3.6 3.3* 3.1*     Assessment and Plan:  Occlusion of right M2/MCA s/p revascularization TICI 3 on 02/15/23 Patient is progressing well without presence of any neurological  symptoms on today's exam. She was extubated on 02/16/23 without issue.   MR/ MRA yesterday: 1. Small acute infarcts in the right frontal and parietal lobes. 2. A few additional acute punctate infarcts in the occipital lobes bilaterally and right cerebellum. 3. No emergent large vessel occlusion or proximal hemodynamically significant stenosis.  Pt been tachycardic during this admission, on digoxin due to times of hypotension.  Most recent VS BP 118/86, HR 106, RR 23, O2 95 % RA  HF team following.  Pt switched from ASA 325 mg qd to Eliquis 5 mg BID per neuro.   Patient doing well from NIR stand point.  NIR will sign off but will be available for consult.  Please call NIR for questions and concerns.    Electronically Signed: Willette Brace, PA-C-C 02/18/2023, 10:11 AM   I spent a total of 15 Minutes at the the patient's bedside AND on the patient's hospital floor or unit, greater than 50% of which was counseling/coordinating care for Occlusion of right M2/MCA s/p revascularization 02/15/23.

## 2023-02-18 NOTE — Discharge Instructions (Addendum)
You are being discharged from [facility] after being admitted for stroke.  You underwent a thrombectomy.  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  During your stay heart failure was consulted and changed some of your medications.  Please make sure to review the discharge paperwork to ensure that you are taking your medications as prescribed.  You are also started on Eliquis for secondary stroke prevention.  Please continue to monitor your blood pressure, blood sugar, cholesterol level as these are all stroke risk factors.  Try to limit stress as possible, stay hydrated, eat a healthy diet, exercise daily.  You will need to follow-up with your primary care provider.  You also need to follow-up with neurology within 8 weeks.  Heart failure will contact you for follow-up appointment as well.   Information on my medicine - ELIQUIS (apixaban)  This medication education was reviewed with me or my healthcare representative as part of my discharge preparation.  Why was Eliquis prescribed for you? Eliquis was prescribed for you to reduce the risk of a blood clot forming that can cause a stroke.  What do You need to know about Eliquis ? Take your Eliquis TWICE DAILY - one tablet in the morning and one tablet in the evening with or without food. If you have difficulty swallowing the tablet whole please discuss with your pharmacist how to take the medication safely.  Take Eliquis exactly as prescribed by your doctor and DO NOT stop taking Eliquis without talking to the doctor who prescribed the medication.  Stopping may increase your risk of developing a stroke.  Refill your prescription before you run out.  After discharge, you should have regular check-up appointments with your healthcare provider that is prescribing your Eliquis.  In the future your dose may need to be changed if your kidney function or weight changes by a  significant amount or as you get older.  What do you do if you miss a dose? If you miss a dose, take it as soon as you remember on the same day and resume taking twice daily.  Do not take more than one dose of ELIQUIS at the same time to make up a missed dose.  Important Safety Information A possible side effect of Eliquis is bleeding. You should call your healthcare provider right away if you experience any of the following: Bleeding from an injury or your nose that does not stop. Unusual colored urine (red or dark brown) or unusual colored stools (red or black). Unusual bruising for unknown reasons. A serious fall or if you hit your head (even if there is no bleeding).  Some medicines may interact with Eliquis and might increase your risk of bleeding or clotting while on Eliquis. To help avoid this, consult your healthcare provider or pharmacist prior to using any new prescription or non-prescription medications, including herbals, vitamins, non-steroidal anti-inflammatory drugs (NSAIDs) and supplements.  This website has more information on Eliquis (apixaban): http://www.eliquis.com/eliquis/home

## 2023-02-18 NOTE — TOC Benefit Eligibility Note (Addendum)
Patient Product/process development scientist completed.    The patient is currently admitted and upon discharge could be taking Eliquis 5 mg.  The current 30 day co-pay is $4.00.   The patient is currently admitted and upon discharge could be taking Corlanor 5 mg.  The current 30 day co-pay is $4.00.   The patient is insured through [employer]  [employer]   This test claim was processed through [facility]- copay amounts may vary at other pharmacies due to pharmacy/plan contracts, or as the patient moves through the different stages of their insurance plan.  Roland Earl, CPHT Pharmacy Patient Advocate Specialist [facility] Patient Advocate Team Direct Number: [phone]  Fax: [phone]

## 2023-02-18 NOTE — Progress Notes (Signed)
NAME:  Stephanie Reilly, MRN:  [id number], DOB:  September 28, 1971, LOS: 3 ADMISSION DATE:  02/15/2023, CONSULTATION DATE:  02/05/2023 REFERRING MD:  Lizabeth Leyden of stroke service, CHIEF COMPLAINT:  code stroke -> Caridac arrest    BRIEF   Neviah Tobey =52 year old female with past medical history of hypertension, hyperlipidemia, diabetes obesity anxiety, depression new diagnosis of systolic heart failure [facility] May, 2024 with ejection fraction 12%].  She was discharged 02/12/2023 on aspirin, torsemide Entresto Aldactone and Farxiga.  She presented acutely on 02/15/2023 with slurred speech and facial droop that resolved prior to coming to the ED and within 1 minute..  Code stroke showed occlusion of proximal right M2.  Diagnosis of acute right MCA ischemic infarct was made.  Referred to interventional radiology and patient intubated for procedure. Immediate post intubation had PEA arest with 2 min CPR and epix x 1 with ROSC. Post ROSC has needed epi gtt (reported not responding to other vasopressors].  And multiple pushes of epinephrine at least 4-5 by the time of transfer to the ICU.  While on epinephrine drip, and underwent diagnostic cerebral angiogram and mechanical thrombectomy for right M2 occlusion with right radial artery access.  She is status post complete recanalization after 3 passes.  Resultant trace subarachnoid hemorrhage in the right sylvian fissure seen.  Systolic blood pressure goal 120-160 and sent to for not ICU.  Critical care medicine consulted.  No thrombolysis given Epinephrine going to peripheral line. On propofol on the ventilator.  Past Medical History:    has a past medical history of Anemia, Anxiety, Arthritis, Depression, Hypertension, and Prediabetes.   has a past surgical history that includes Abdominal hysterectomy; Tonsillectomy; Tubal ligation; RIGHT/LEFT HEART CATH AND CORONARY ANGIOGRAPHY (N/A, 02/10/2023); Radiology with anesthesia (N/A, 02/15/2023); IR PERCUTANEOUS ART  THROMBECTOMY/INFUSION INTRACRANIAL INC DIAG ANGIO (02/15/2023); IR US Guide Vasc Access Right (02/15/2023); and IR CT Head Ltd (02/15/2023).   Significant Hospital Events:  02/15/2023 - admit  Post Arrest and mechanical thrombectomy . Since then pressor dependent. S/p R IJ and IO left tibia briefly 5/12 Cardiology consulted, extubated  Interim History / Subjective:   Off pressors.  Looks stable  Objective   Blood pressure 118/86, pulse (!) 106, temperature 98 F (36.7 C), temperature source Oral, resp. rate (!) 23, height 5\' 2"  (1.575 m), weight 111.4 kg, SpO2 95 %. CVP:  [11 mmHg-16 mmHg] 12 mmHg      Intake/Output Summary (Last 24 hours) at 02/18/2023 1011 Last data filed at 02/17/2023 2120 Gross per 24 hour  Intake 59.92 ml  Output 100 ml  Net -40.08 ml   Filed Weights   02/15/23 1333  Weight: 111.4 kg   Blood pressure 118/86, pulse (!) 106, temperature 98 F (36.7 C), temperature source Oral, resp. rate (!) 23, height 5\' 2"  (1.575 m), weight 111.4 kg, SpO2 95 %. Gen:      No acute distress HEENT:  EOMI, sclera anicteric Neck:     No masses; no thyromegaly Lungs:    Clear to auscultation bilaterally; normal respiratory effort CV:         Regular rate and rhythm; no murmurs Abd:      + bowel sounds; soft, non-tender; no palpable masses, no distension Ext:    No edema; adequate peripheral perfusion Skin:      Warm and dry; no rash Neuro: alert and oriented x 3 Psych: normal mood and affect   Labs/imaging reviewed No new labs today  Resolved Hospital Problem list  AKI Resp failure  Assessment & Plan:   R MCA stroke- complicated by IHCA presumably due to induction in setting of advanced NICM with little reserve.  No issues other than some post arrest vasoplegia Shock- in context of post arrest period and baseline EF around 10-15%; improving Advanced NICM Obesity Diabetes Hypertension Hyperlipidemia Metabolic syndrome  -Off pressors - Progressive mobility - GDMT  post stroke per neuro and aHF discussion - Noted plans for transfer out of ICU  PCCM will sign off and be available as needed.  Please call with any questions.  Chilton Greathouse MD [facility] See Amion for pager  If no response to pager , please call [phone] until 7pm After 7:00 pm call Elink  [phone] 02/18/2023, 10:17 AM

## 2023-02-18 NOTE — TOC CM/SW Note (Signed)
Patient has Heart Failure and having a bedside commode in the home with help reduce risk of falls due emergent need to use bathroom due to diuretics used to treat Heart Failure.

## 2023-02-18 NOTE — Progress Notes (Addendum)
Advanced Heart Failure Rounding Note  PCP-Cardiologist: Rollene Rotunda, MD   Subjective:   Stephanie Reilly stopped. Started on digoxin 0.125 mcg.    Denies SOB.   Objective:   Weight Range: 111.4 kg Body mass index is 44.92 kg/m.   Vital Signs:   Temp:  [98 F (36.7 C)-99.4 F (37.4 C)] 98 F (36.7 C) (05/14 0800) Pulse Rate:  [74-123] 106 (05/14 0900) Resp:  [14-27] 23 (05/14 0900) BP: (91-140)/(41-95) 118/86 (05/14 0900) SpO2:  [89 %-95 %] 95 % (05/14 0900) Last BM Date :  (PTA)  Weight change: Filed Weights   02/15/23 1333  Weight: 111.4 kg    Intake/Output:   Intake/Output Summary (Last 24 hours) at 02/18/2023 0922 Last data filed at 02/17/2023 2120 Gross per 24 hour  Intake 89.37 ml  Output 225 ml  Net -135.63 ml      Physical Exam   General:   No resp difficulty HEENT: normal Neck: supple. JVP 7-8 . Carotids 2+ bilat; no bruits. No lymphadenopathy or thryomegaly appreciated. Cor: PMI nondisplaced. Tachy Regular rate & rhythm. No rubs, gallops or murmurs. Lungs: clear Abdomen: soft, nontender, nondistended. No hepatosplenomegaly. No bruits or masses. Good bowel sounds. Extremities: no cyanosis, clubbing, rash, edema. RUE PICC  Neuro: alert & orientedx3, cranial nerves grossly intact. moves all 4 extremities w/o difficulty. Affect pleasant   Telemetry   ST with occasional PVCs.  EKG    N/A   Labs    CBC Recent Labs    02/15/23 1245 02/15/23 1251 02/16/23 0945 02/17/23 0601  WBC 11.0*   < > 17.6* 12.8*  NEUTROABS 6.7  --   --   --   HGB 14.9   < > 12.6 11.6*  HCT 46.9*   < > 40.2 37.5  MCV 82.4   < > 85.4 85.2  PLT 337   < > 246 207   < > = values in this interval not displayed.   Basic Metabolic Panel Recent Labs    16/10/96 0945 02/17/23 0601  NA 140 133*  K 3.3* 3.8  CL 102 102  CO2 25 26  GLUCOSE 181* 116*  BUN 20 11  CREATININE 1.17* 0.85  CALCIUM 9.6 9.5  MG 2.3 2.2  PHOS 3.0 2.6   Liver Function  Tests Recent Labs    02/16/23 0945 02/17/23 0601  AST 21 17  ALT 24 21  ALKPHOS 66 65  BILITOT 0.6 1.0  PROT 7.7 7.0  ALBUMIN 3.3* 3.1*   No results for input(s): "LIPASE", "AMYLASE" in the last 72 hours. Cardiac Enzymes No results for input(s): "CKTOTAL", "CKMB", "CKMBINDEX", "TROPONINI" in the last 72 hours.  BNP: BNP (last 3 results) Recent Labs    02/07/23 0947 02/15/23 2020  BNP 1,470.5* 1,305.5*    ProBNP (last 3 results) No results for input(s): "PROBNP" in the last 8760 hours.   D-Dimer No results for input(s): "DDIMER" in the last 72 hours. Hemoglobin A1C Recent Labs    02/15/23 2018  HGBA1C 6.1*   Fasting Lipid Panel Recent Labs    02/16/23 0945  CHOL 155  HDL 45  LDLCALC 98  TRIG 62  CHOLHDL 3.4   Thyroid Function Tests No results for input(s): "TSH", "T4TOTAL", "T3FREE", "THYROIDAB" in the last 72 hours.  Invalid input(s): "FREET3"  Other results:   Imaging    MR BRAIN WO CONTRAST  Result Date: 02/17/2023 CLINICAL DATA:  Stroke, follow up EXAM: MRI HEAD WITHOUT CONTRAST MRA HEAD WITHOUT CONTRAST TECHNIQUE:  Multiplanar, multi-echo pulse sequences of the brain and surrounding structures were acquired without intravenous contrast. Angiographic images of the Circle of Willis were acquired using MRA technique without intravenous contrast. COMPARISON:  None Available. FINDINGS: MRI HEAD FINDINGS Brain: Small acute infarcts in the right frontal and parietal lobes, largest in the right frontal lobe laterally. A few acute punctate infarcts in the occipital lobes bilaterally and right cerebellum. No mass occupying acute hemorrhage, mass lesion or midline shift. No hydrocephalus. Vascular: See below. Skull and upper cervical spine: Normal marrow signal. Sinuses/Orbits: No acute or significant finding. Other: No mastoid effusions. MRA HEAD FINDINGS Anterior circulation: Bilateral intracranial ICAs, MCAs, and ACAs are patent without proximal hemodynamically  significant stenosis. Posterior circulation: Bilateral intradural vertebral arteries, basilar artery and bilateral posterior cerebral arteries IMPRESSION: 1. Small acute infarcts in the right frontal and parietal lobes. 2. A few additional acute punctate infarcts in the occipital lobes bilaterally and right cerebellum. 3. No emergent large vessel occlusion or proximal hemodynamically significant stenosis. Electronically Signed   By: Feliberto Harts M.D.   On: 02/17/2023 15:21   MR ANGIO HEAD WO CONTRAST  Result Date: 02/17/2023 CLINICAL DATA:  Stroke, follow up EXAM: MRI HEAD WITHOUT CONTRAST MRA HEAD WITHOUT CONTRAST TECHNIQUE: Multiplanar, multi-echo pulse sequences of the brain and surrounding structures were acquired without intravenous contrast. Angiographic images of the Circle of Willis were acquired using MRA technique without intravenous contrast. COMPARISON:  None Available. FINDINGS: MRI HEAD FINDINGS Brain: Small acute infarcts in the right frontal and parietal lobes, largest in the right frontal lobe laterally. A few acute punctate infarcts in the occipital lobes bilaterally and right cerebellum. No mass occupying acute hemorrhage, mass lesion or midline shift. No hydrocephalus. Vascular: See below. Skull and upper cervical spine: Normal marrow signal. Sinuses/Orbits: No acute or significant finding. Other: No mastoid effusions. MRA HEAD FINDINGS Anterior circulation: Bilateral intracranial ICAs, MCAs, and ACAs are patent without proximal hemodynamically significant stenosis. Posterior circulation: Bilateral intradural vertebral arteries, basilar artery and bilateral posterior cerebral arteries IMPRESSION: 1. Small acute infarcts in the right frontal and parietal lobes. 2. A few additional acute punctate infarcts in the occipital lobes bilaterally and right cerebellum. 3. No emergent large vessel occlusion or proximal hemodynamically significant stenosis. Electronically Signed   By: Feliberto Harts M.D.   On: 02/17/2023 15:21     Medications:     Scheduled Medications:  apixaban  5 mg Oral BID   Chlorhexidine Gluconate Cloth  6 each Topical Daily   digoxin  0.125 mg Oral Daily   docusate sodium  100 mg Oral BID   insulin aspart  0-15 Units Subcutaneous TID WC   insulin aspart  0-5 Units Subcutaneous QHS   polyethylene glycol  17 g Oral Daily   rosuvastatin  20 mg Oral Daily   sodium chloride flush  10-40 mL Intracatheter Q12H   spironolactone  12.5 mg Oral Daily    Infusions:  sodium chloride Stopped (02/16/23 0138)   norepinephrine (LEVOPHED) Adult infusion Stopped (02/17/23 1405)    PRN Medications: sodium chloride, acetaminophen **OR** acetaminophen (TYLENOL) oral liquid 160 mg/5 mL **OR** acetaminophen, mouth rinse, senna-docusate, sodium chloride flush    Patient Profile   Stephanie Reilly is a 52 y.o. female with hypertension, hyperlipidemia, type 2 diabetes, obesity and recently diagnosed systolic heart failure currently admitted with stroke. On arrival code stroke was called with imaging demonstrating occlusion of the proximal right M2. She was sent to interventional radiology and intubated. Immediately postintubation she had PEA  arrest requiring 2 minutes of CPR and epinephrine prior to ROSC. Since that time she has undergone diagnostic cerebral angiogram and thrombectomy for the right M2 occlusion now status post complete recanalization.   Assessment/Plan   PEA arrest w/ recently diagnosed systolic heart failure - PEA arrest likely after induction for intubation. Extubated yesterday. Sats stable.   - Newly diagnosed HFrEF earlier this month. ECHO preserved RV function, LVEF 15%. LHC /RHC with normal cors. MRI- LV EF 12% RV moderate dysfunction. Subendocardial LGE - ? Small infarct possible sarcoid. Will need PET scan . NICM ? PVCs ?  Yesterday Reilly stopped.  CO-OX 73%  - Volume status stable.  - Hold off on bb.  - Continue digoxin 0.125 mcg.  - Add 12.5  mg spironolactone today - Add farxiga 10 mg daily. She was on farxiga prior to admit.  - Consider entresto tomorrow.  - Hold off on GDMT until off pressors.  - Check BMET    2. Right M2 stroke - s/p mechanical thrombectomy with small SAH - Neuro started on eliquis.  - On statin.  - No deficits.    3. Acute Renal failure - Resolved.   4. DMII Hgb A1C 6.1  On SSI   5. H/O NSVT /PVCs  During recent hospitalization had > 40 beats NSVT, asymptomatic.  Dr Elberta Fortis EP saw plan for GDMT  - Life Vest ordered for d/c . Discussed.   Length of Stay: 3  Amy Clegg, NP  02/18/2023, 9:22 AM  Advanced Heart Failure Team Pager [phone] (M-F; 7a - 5p)  Please contact [facility] for night-coverage after hours (5p -7a ) and weekends on [email or web address]  Agree with the above NP note.   No dyspnea, BP stable.   General: NAD Neck: No JVD, no thyromegaly or thyroid nodule.  Lungs: Clear to auscultation bilaterally with normal respiratory effort. CV: Nondisplaced PMI.  Heart regular S1/S2, no S3/S4, no murmur.  No peripheral edema.  Abdomen: Soft, nontender, no hepatosplenomegaly, no distention.  Skin: Intact without lesions or rashes.  Neurologic: Alert and oriented x 3.  Psych: Normal affect. Extremities: No clubbing or cyanosis.  HEENT: Normal.   Nonischemic cardiomyopathy, LGE pattern on cMRI consistent with small prior infarct vs cardiac sarcoidosis.  Not volume overloaded on exam, now off NE.  - Continue digoxin 0.125.  - Add spironolactone 12.5  - Add Farxiga 10 daily  - Gradual uptitration of GDMT.  - Cardiac PET as outpatient.    PVCs/long NSVT run last admission.  Will place Lifevest.  Not enough PVCs currently to warrant amiodarone.    S/p CVA, likely cardioembolic. Start Eliquis today.    Marca Ancona 02/18/2023

## 2023-02-18 NOTE — Progress Notes (Signed)
Mobility Specialist Progress Note   02/18/23 1700  Mobility  Activity Ambulated with assistance in hallway  Level of Assistance Contact guard assist, steadying assist  Assistive Device Front wheel walker  Distance Ambulated (ft) 420 ft  Activity Response Tolerated well  Mobility Referral Yes  $Mobility charge 1 Mobility  Mobility Specialist Start Time (ACUTE ONLY) 1735  Mobility Specialist Stop Time (ACUTE ONLY) 1755  Mobility Specialist Time Calculation (min) (ACUTE ONLY) 20 min   Pre Mobility: 120 HR, 98% SpO2 on RA During Mobility: 157 HR, 93% SpO2 on RA Post Mobility: 131 HR, 96% SpO2 on RA  Pt received at EOB having no complaints and agreeable to mobility. X1 seated rest break during ambulation d/t fatigue and DOE. Able to recover w/ PLB after ~79mins. Returned back to EOB w/o fault all needs met and call bell in reach.  Stephanie Reilly Mobility Specialist Please contact via SecureChat or  Rehab office at [phone]
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
## 2023-02-19 DIAGNOSIS — I5021 Acute systolic (congestive) heart failure: Secondary | ICD-10-CM | POA: Diagnosis not present

## 2023-02-19 DIAGNOSIS — I5022 Chronic systolic (congestive) heart failure: Secondary | ICD-10-CM

## 2023-02-19 DIAGNOSIS — I63411 Cerebral infarction due to embolism of right middle cerebral artery: Secondary | ICD-10-CM | POA: Diagnosis not present

## 2023-02-19 DIAGNOSIS — I5043 Acute on chronic combined systolic (congestive) and diastolic (congestive) heart failure: Secondary | ICD-10-CM | POA: Diagnosis not present

## 2023-02-19 LAB — BASIC METABOLIC PANEL
Anion gap: 8 (ref 5–15)
BUN: 12 mg/dL (ref 6–20)
CO2: 26 mmol/L (ref 22–32)
Calcium: 9.6 mg/dL (ref 8.9–10.3)
Chloride: 105 mmol/L (ref 98–111)
Creatinine, Ser: 0.85 mg/dL (ref 0.44–1.00)
GFR, Estimated: >60 mL/min (ref >60)
Glucose, Bld: 93 mg/dL (ref 70–99)
Potassium: 4 mmol/L (ref 3.5–5.1)
Sodium: 139 mmol/L (ref 135–145)

## 2023-02-19 LAB — CBC
HCT: 36.1 % (ref 36.0–46.0)
Hemoglobin: 11.5 g/dL — ABNORMAL LOW (ref 12.0–15.0)
MCH: 26.5 pg (ref 26.0–34.0)
MCHC: 31.9 g/dL (ref 30.0–36.0)
MCV: 83.2 fL (ref 80.0–100.0)
Platelets: 189 10*3/uL (ref 150–400)
RBC: 4.34 MIL/uL (ref 3.87–5.11)
RDW: 15.4 % (ref 11.5–15.5)
WBC: 8.7 10*3/uL (ref 4.0–10.5)
nRBC: 0 % (ref 0.0–0.2)

## 2023-02-19 LAB — GLUCOSE, CAPILLARY
Glucose-Capillary: 92 mg/dL (ref 70–99)
Glucose-Capillary: 119 mg/dL — ABNORMAL HIGH (ref 70–99)
Glucose-Capillary: 96 mg/dL (ref 70–99)
Glucose-Capillary: 104 mg/dL — ABNORMAL HIGH (ref 70–99)

## 2023-02-19 MED ORDER — SACUBITRIL-VALSARTAN 24-26 MG PO TABS
1.0000 | ORAL_TABLET | Freq: Two times a day (BID) | ORAL | Status: DC
  Administered 2023-02-19 – 2023-02-20 (×3): 1 via ORAL
  Filled 2023-02-19 (×3): qty 1

## 2023-02-19 MED ORDER — SPIRONOLACTONE 25 MG PO TABS
25.0000 mg | ORAL_TABLET | Freq: Every day | ORAL | Status: DC
  Administered 2023-02-19 – 2023-02-20 (×2): 25 mg via ORAL
  Filled 2023-02-19 (×2): qty 1

## 2023-02-19 MED ORDER — SPIRONOLACTONE 25 MG PO TABS: 25.0000 mg | ORAL_TABLET | Freq: Every day | ORAL | Status: DC

## 2023-02-19 NOTE — Plan of Care (Signed)
  Problem: Education: Goal: Understanding of CV disease, CV risk reduction, and recovery process will improve Outcome: Progressing Goal: Individualized Educational Video(s) Outcome: Progressing   Problem: Activity: Goal: Ability to return to baseline activity level will improve Outcome: Progressing   Problem: Cardiovascular: Goal: Ability to achieve and maintain adequate cardiovascular perfusion will improve Outcome: Progressing Goal: Vascular access site(s) Level 0-1 will be maintained Outcome: Progressing   Problem: Education: Goal: Knowledge of disease or condition will improve Outcome: Progressing Goal: Knowledge of secondary prevention will improve (MUST DOCUMENT ALL) Outcome: Progressing Goal: Knowledge of patient specific risk factors will improve Loraine Leriche N/A or DELETE if not current risk factor) Outcome: Progressing   Problem: Ischemic Stroke/TIA Tissue Perfusion: Goal: Complications of ischemic stroke/TIA will be minimized Outcome: Progressing   Problem: Coping: Goal: Will verbalize positive feelings about self Outcome: Progressing Goal: Will identify appropriate support needs Outcome: Progressing
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
## 2023-02-19 NOTE — Discharge Summary (Incomplete)
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 Pt clarifies had constipation, nausea.  Stool softeners did not help.    Latex Other (See Comments)   Unknown    Lisinopril Hives   Naproxen Other (See Comments)   Unknown    Tramadol Hives   Tylenol [acetaminophen] Other (See Comments)   With Codeine; GI    Penicillins Rash   Serotonin Nausea Only, Swelling, Anxiety, Rash, Other (See Comments)   GI intolerence        Medication List     STOP taking these medications    acetaminophen 500 MG tablet Commonly known as: TYLENOL   Aspirin Low Dose 81 MG chewable tablet Generic drug: aspirin   metoprolol succinate 50 MG 24 hr tablet Commonly known as: TOPROL-XL   Vitamin E 45 MG (100 UNIT) Caps       TAKE these medications    apixaban 5 MG Tabs tablet Commonly known as: ELIQUIS Take 1 tablet (5 mg total) by mouth 2 (two) times daily.   Biotin Maximum Strength 10 MG Tabs Generic drug: Biotin Take 10 mg by mouth daily.   carvedilol 3.125  MG tablet Commonly known as: COREG Take 1 tablet (3.125 mg total) by mouth 2 (two) times daily with a meal.   cetirizine 10 MG tablet Commonly known as: ZYRTEC Take 10 mg by mouth daily.   digoxin 0.125 MG tablet Commonly known as: LANOXIN Take 1 tablet (0.125 mg total) by mouth daily. Start taking on: Feb 21, 2023   Farxiga 10 MG Tabs tablet Generic drug: dapagliflozin propanediol Take 1 tablet (10 mg total) by mouth daily.   HAIR SKIN & NAILS PO Take 2 tablets by mouth daily.   polyethylene glycol 17 g packet Commonly known as: MIRALAX / GLYCOLAX Take 17 g by mouth daily. Start taking on: Feb 21, 2023   rosuvastatin 20 MG tablet Commonly known as: CRESTOR Take 1 tablet (20 mg total) by mouth daily. Start taking on: Feb 21, 2023   sacubitril-valsartan 24-26 MG Commonly known as: ENTRESTO Take 1 tablet by mouth 2 (two) times daily.   senna-docusate 8.6-50 MG tablet Commonly known as: Senokot-S Take 1 tablet by mouth at bedtime as needed for mild constipation.   spironolactone 25 MG tablet Commonly known as: ALDACTONE Take 1 tablet (25 mg total) by mouth daily.   torsemide 20 MG tablet Commonly known as: DEMADEX Take 1 tablet (20 mg total) by mouth daily as needed (for increased edema). What changed:  how much to take when  to take this reasons to take this   vitamin C 100 MG tablet Take 100 mg by mouth daily.               Durable Medical Equipment  (From admission, onward)           Start     Ordered   02/18/23 1235  For home use only DME Walker rolling  Once       Question Answer Comment  Walker: With 5 Inch Wheels   Patient needs a walker to treat with the following condition Heart failure (HCC)   Patient needs a walker to treat with the following condition Acute right MCA stroke (HCC)      02/18/23 1236   02/18/23 1227  For home use only DME Bedside commode  Once       Question Answer Comment  Patient needs a bedside commode to treat  with the following condition Heart failure [facility])   Patient needs a bedside commode to treat with the following condition Acute right MCA stroke (HCC)      02/18/23 1236   02/17/23 1805  For home use only DME Vest life vest  Once       Comments: ZOLL Life Vest   Identify indication. Type yes or no  Cardiac Arrest due to VF or sustained VT:  Familial or inherited condition with SCA risk: MI with an EF < 35% or DCM with an EF < 35%: Yes  ICD Explanation:  Other condition with high risk of VT/VF:   Life Vest Setting  VT 150 bpm VF 200 BPM 150Jx5 Length of need: 3 months  Start Date: 02/18/23   02/17/23 1805            LABORATORY STUDIES CBC    Component Value Date/Time   WBC 8.7 02/19/2023 0605   RBC 4.34 02/19/2023 0605   HGB 11.5 (L) 02/19/2023 0605   HCT 36.1 02/19/2023 0605   PLT 189 02/19/2023 0605   MCV 83.2 02/19/2023 0605   MCH 26.5 02/19/2023 0605   MCHC 31.9 02/19/2023 0605   RDW 15.4 02/19/2023 0605   LYMPHSABS 3.3 02/15/2023 1245   MONOABS 0.9 02/15/2023 1245   EOSABS 0.1 02/15/2023 1245   BASOSABS 0.1 02/15/2023 1245   CMP    Component Value Date/Time   NA 137 02/20/2023 0445   K 3.5 02/20/2023 0445   CL 104 02/20/2023 0445   CO2 24 02/20/2023 0445   GLUCOSE 113 (H) 02/20/2023 0445   BUN 10 02/20/2023 0445   CREATININE 0.85 02/20/2023 0445   CALCIUM 9.4 02/20/2023 0445   PROT 7.0 02/17/2023 0601   ALBUMIN 3.1 (L) 02/17/2023 0601   AST 17 02/17/2023 0601   ALT 21 02/17/2023 0601   ALKPHOS 65 02/17/2023 0601   BILITOT 1.0 02/17/2023 0601   GFRNONAA >60 02/20/2023 0445   COAGS Lab Results  Component Value Date   INR 1.2 02/15/2023   INR 1.1 02/15/2023   INR 1.3 (H) 02/07/2023   Lipid Panel    Component Value Date/Time   CHOL 155 02/16/2023 0945   TRIG 62 02/16/2023 0945   HDL 45 02/16/2023 0945   CHOLHDL 3.4 02/16/2023 0945   VLDL 12 02/16/2023 0945   LDLCALC 98 02/16/2023 0945   HgbA1C  Lab Results  Component Value Date    HGBA1C 6.1 (H) 02/15/2023   Urinalysis    Component Value Date/Time   COLORURINE YELLOW 02/15/2023 1227   APPEARANCEUR CLOUDY (A) 02/15/2023 1227  LABSPEC 1.008 02/15/2023 1227   PHURINE 7.0 02/15/2023 1227   GLUCOSEU 150 (A) 02/15/2023 1227   HGBUR LARGE (A) 02/15/2023 1227   BILIRUBINUR NEGATIVE 02/15/2023 1227   KETONESUR NEGATIVE 02/15/2023 1227   PROTEINUR NEGATIVE 02/15/2023 1227   NITRITE NEGATIVE 02/15/2023 1227   LEUKOCYTESUR LARGE (A) 02/15/2023 1227   Urine Drug Screen     Component Value Date/Time   LABOPIA NONE DETECTED 02/15/2023 1227   COCAINSCRNUR NONE DETECTED 02/15/2023 1227   LABBENZ NONE DETECTED 02/15/2023 1227   AMPHETMU NONE DETECTED 02/15/2023 1227   THCU NONE DETECTED 02/15/2023 1227   LABBARB NONE DETECTED 02/15/2023 1227    Alcohol Level    Component Value Date/Time   ETH <10 02/15/2023 1245     SIGNIFICANT DIAGNOSTIC STUDIES IR PERCUTANEOUS ART THROMBECTOMY/INFUSION INTRACRANIAL INC DIAG ANGIO  Result Date: 02/17/2023 INDICATION: 52 year old female who presented to ED with slurred speech and left-sided facial droop, NIHSS 2. Her last known well was 12:30 p.m. on 02/15/2023. Patient had complete resolution of symptoms while evaluated by neurology. However, fluctuating left-sided weakness was noted, improved while lying flat. Head CT showed no evidence of an acute territorial infarct or hemorrhage. CT angiogram of the head and neck showed a right M2/MCA occlusion. Due to recurrence of symptoms, risks and benefits of proceeding with intervention were discussed with the patient and her family. They elected to proceed with mechanical thrombectomy. EXAM: ULTRASOUND-GUIDED VASCULAR ACCESS DIAGNOSTIC CEREBRAL ANGIOGRAM MECHANICAL THROMBECTOMY FLAT PANEL HEAD CT COMPARISON:  CT/CT angiogram of the head and Feb 15, 2023. MEDICATIONS: Refer to anesthesia documentation. ANESTHESIA/SEDATION: The procedure was performed under general anesthesia. CONTRAST:  75 mL of  Omnipaque 300 mg/mL FLUOROSCOPY: Radiation Exposure Index (as provided by the fluoroscopic device): Peak skin dose 1542 mGy COMPLICATIONS: None immediate. TECHNIQUE: Informed written consent was obtained from the patient after a thorough discussion of the procedural risks, benefits and alternatives. All questions were addressed. Maximal Sterile Barrier Technique was utilized including caps, mask, sterile gowns, sterile gloves, sterile drape, hand hygiene and skin antiseptic. A timeout was performed prior to the initiation of the procedure. After anesthesia induction, patient developed profound hypotension requiring CPR. Normal pulse palpated after 2 minutes. Using the modified Seldinger technique and a micropuncture kit, access was gained to the right radial artery at the wrist and a 7 French sheath was placed. Real-time ultrasound guidance was utilized for vascular access including the acquisition of a permanent ultrasound image documenting patency of the accessed vessel. Then, a right radial artery angiogram was obtained via sheath side port. Normal brachial artery branching pattern seen. No significant anatomical variation. The right radial artery caliber is adequate for vascular access. Next, a 7 [place] Rist catheter was navigated over a 6 [place] Berenstein 2 catheter and a 0.035 "Terumo Glidewire into the right subclavian artery under fluoroscopic guidance. The catheter was then placed into the right common carotid artery and then advanced into the right internal carotid artery. Frontal and lateral angiograms of the head were obtained. FINDINGS: 1. Patent right radial artery with standard anatomy. 2. There is an occlusion of the right M2/MCA dominant superior division branch. PROCEDURE: Using biplane roadmap guidance, a FreeClimb 54 aspiration catheter was navigated over Tenzing delivery catheter into the cavernous segment of the right ICA. The aspiration catheter was then advanced to the level of occlusion and  connected to an aspiration pump. Continuous aspiration was performed for 2 minutes. The guide catheter was connected to a VacLok syringe. The aspiration catheter was subsequently removed under  constant aspiration. The guide catheter was aspirated for debris. Right internal carotid artery angiograms with frontal and lateral views of the head showed recanalization of the M2 segment with occlusion of 2 M3 segments. Using biplane roadmap guidance, a FreeClimb 54 aspiration catheter was navigated over a phenom 21 microcatheter and an Aristotle 14 microguidewire into the cavernous segment of the right ICA. The microcatheter was then navigated over the wire into the right M3/MCA. Then, a 3 mm solitaire stent retriever was deployed spanning the M2-M3 segment. The device was allowed to intercalated with the clot for 4 minutes. The microcatheter was removed. The aspiration catheter was advanced to the level of occlusion and connected to an aspiration pump. The thrombectomy device and aspiration catheter were removed under constant aspiration. The guide catheter was aspirated for debris. Right internal carotid artery angiograms with frontal lateral views of head showed recanalization of the M3 segment with nonocclusive residual filling defect. Using biplane roadmap guidance, a FreeClimb 54 aspiration catheter was navigated over a phenom 21 microcatheter and an Aristotle 14 microguidewire into the cavernous segment of the right ICA. The microcatheter was then navigated over the wire into the right M3/MCA. Then, a 3 mm solitaire stent retriever was deployed spanning the M2-M3 segment. The device was allowed to intercalated with the clot for 4 minutes. The microcatheter was removed. The aspiration catheter was advanced to the level of occlusion and connected to an aspiration pump. The thrombectomy device and aspiration catheter were removed under constant aspiration. The guide catheter was aspirated for debris. Right internal  carotid artery angiograms with frontal lateral views of head showed recanalization of the addressed M3 segment with persistent occlusion of a distal M3/MCA in the right frontal region with adequate retrograde collateral flow. The catheter was subsequently withdrawn. Flat panel CT of the head was obtained and post processed in a separate workstation with concurrent attending physician supervision. Selected images were sent to PACS. Trace hyperdensity in the right sylvian fissure suggesting minor contrast staining versus subarachnoid hemorrhage. An inflatable band was placed and inflated over the right wrist access site. The vascular sheath was withdrawn and the band was slowly deflated until brisk flow was noted through the arteriotomy site. At this point, the band was reinflated with additional 4 cc of air to obtain patent hemostasis. IMPRESSION: Successful mechanical thrombectomy for treatment of a proximal and dominant right M2/MCA superior division branch achieving TICI 2B recanalization. PLAN: Patient transferred to ICU for continued management. Electronically Signed   By: Baldemar Lenis M.D.   On: 02/17/2023 15:54   IR CT Head Ltd  Result Date: 02/17/2023 INDICATION: 52 year old female who presented to ED with slurred speech and left-sided facial droop, NIHSS 2. Her last known well was 12:30 p.m. on 02/15/2023. Patient had complete resolution of symptoms while evaluated by neurology. However, fluctuating left-sided weakness was noted, improved while lying flat. Head CT showed no evidence of an acute territorial infarct or hemorrhage. CT angiogram of the head and neck showed a right M2/MCA occlusion. Due to recurrence of symptoms, risks and benefits of proceeding with intervention were discussed with the patient and her family. They elected to proceed with mechanical thrombectomy. EXAM: ULTRASOUND-GUIDED VASCULAR ACCESS DIAGNOSTIC CEREBRAL ANGIOGRAM MECHANICAL THROMBECTOMY FLAT PANEL HEAD CT  COMPARISON:  CT/CT angiogram of the head and Feb 15, 2023. MEDICATIONS: Refer to anesthesia documentation. ANESTHESIA/SEDATION: The procedure was performed under general anesthesia. CONTRAST:  75 mL of Omnipaque 300 mg/mL FLUOROSCOPY: Radiation Exposure Index (as provided by the fluoroscopic  device): Peak skin dose 1542 mGy COMPLICATIONS: None immediate. TECHNIQUE: Informed written consent was obtained from the patient after a thorough discussion of the procedural risks, benefits and alternatives. All questions were addressed. Maximal Sterile Barrier Technique was utilized including caps, mask, sterile gowns, sterile gloves, sterile drape, hand hygiene and skin antiseptic. A timeout was performed prior to the initiation of the procedure. After anesthesia induction, patient developed profound hypotension requiring CPR. Normal pulse palpated after 2 minutes. Using the modified Seldinger technique and a micropuncture kit, access was gained to the right radial artery at the wrist and a 7 French sheath was placed. Real-time ultrasound guidance was utilized for vascular access including the acquisition of a permanent ultrasound image documenting patency of the accessed vessel. Then, a right radial artery angiogram was obtained via sheath side port. Normal brachial artery branching pattern seen. No significant anatomical variation. The right radial artery caliber is adequate for vascular access. Next, a 7 [place] Rist catheter was navigated over a 6 [place] Berenstein 2 catheter and a 0.035 "Terumo Glidewire into the right subclavian artery under fluoroscopic guidance. The catheter was then placed into the right common carotid artery and then advanced into the right internal carotid artery. Frontal and lateral angiograms of the head were obtained. FINDINGS: 1. Patent right radial artery with standard anatomy. 2. There is an occlusion of the right M2/MCA dominant superior division branch. PROCEDURE: Using biplane roadmap  guidance, a FreeClimb 54 aspiration catheter was navigated over Tenzing delivery catheter into the cavernous segment of the right ICA. The aspiration catheter was then advanced to the level of occlusion and connected to an aspiration pump. Continuous aspiration was performed for 2 minutes. The guide catheter was connected to a VacLok syringe. The aspiration catheter was subsequently removed under constant aspiration. The guide catheter was aspirated for debris. Right internal carotid artery angiograms with frontal and lateral views of the head showed recanalization of the M2 segment with occlusion of 2 M3 segments. Using biplane roadmap guidance, a FreeClimb 54 aspiration catheter was navigated over a phenom 21 microcatheter and an Aristotle 14 microguidewire into the cavernous segment of the right ICA. The microcatheter was then navigated over the wire into the right M3/MCA. Then, a 3 mm solitaire stent retriever was deployed spanning the M2-M3 segment. The device was allowed to intercalated with the clot for 4 minutes. The microcatheter was removed. The aspiration catheter was advanced to the level of occlusion and connected to an aspiration pump. The thrombectomy device and aspiration catheter were removed under constant aspiration. The guide catheter was aspirated for debris. Right internal carotid artery angiograms with frontal lateral views of head showed recanalization of the M3 segment with nonocclusive residual filling defect. Using biplane roadmap guidance, a FreeClimb 54 aspiration catheter was navigated over a phenom 21 microcatheter and an Aristotle 14 microguidewire into the cavernous segment of the right ICA. The microcatheter was then navigated over the wire into the right M3/MCA. Then, a 3 mm solitaire stent retriever was deployed spanning the M2-M3 segment. The device was allowed to intercalated with the clot for 4 minutes. The microcatheter was removed. The aspiration catheter was advanced to the  level of occlusion and connected to an aspiration pump. The thrombectomy device and aspiration catheter were removed under constant aspiration. The guide catheter was aspirated for debris. Right internal carotid artery angiograms with frontal lateral views of head showed recanalization of the addressed M3 segment with persistent occlusion of a distal M3/MCA in the right frontal region with  adequate retrograde collateral flow. The catheter was subsequently withdrawn. Flat panel CT of the head was obtained and post processed in a separate workstation with concurrent attending physician supervision. Selected images were sent to PACS. Trace hyperdensity in the right sylvian fissure suggesting minor contrast staining versus subarachnoid hemorrhage. An inflatable band was placed and inflated over the right wrist access site. The vascular sheath was withdrawn and the band was slowly deflated until brisk flow was noted through the arteriotomy site. At this point, the band was reinflated with additional 4 cc of air to obtain patent hemostasis. IMPRESSION: Successful mechanical thrombectomy for treatment of a proximal and dominant right M2/MCA superior division branch achieving TICI 2B recanalization. PLAN: Patient transferred to ICU for continued management. Electronically Signed   By: Baldemar Lenis M.D.   On: 02/17/2023 15:54   IR US Guide Vasc Access Right  Result Date: 02/17/2023 INDICATION: 52 year old female who presented to ED with slurred speech and left-sided facial droop, NIHSS 2. Her last known well was 12:30 p.m. on 02/15/2023. Patient had complete resolution of symptoms while evaluated by neurology. However, fluctuating left-sided weakness was noted, improved while lying flat. Head CT showed no evidence of an acute territorial infarct or hemorrhage. CT angiogram of the head and neck showed a right M2/MCA occlusion. Due to recurrence of symptoms, risks and benefits of proceeding with  intervention were discussed with the patient and her family. They elected to proceed with mechanical thrombectomy. EXAM: ULTRASOUND-GUIDED VASCULAR ACCESS DIAGNOSTIC CEREBRAL ANGIOGRAM MECHANICAL THROMBECTOMY FLAT PANEL HEAD CT COMPARISON:  CT/CT angiogram of the head and Feb 15, 2023. MEDICATIONS: Refer to anesthesia documentation. ANESTHESIA/SEDATION: The procedure was performed under general anesthesia. CONTRAST:  75 mL of Omnipaque 300 mg/mL FLUOROSCOPY: Radiation Exposure Index (as provided by the fluoroscopic device): Peak skin dose 1542 mGy COMPLICATIONS: None immediate. TECHNIQUE: Informed written consent was obtained from the patient after a thorough discussion of the procedural risks, benefits and alternatives. All questions were addressed. Maximal Sterile Barrier Technique was utilized including caps, mask, sterile gowns, sterile gloves, sterile drape, hand hygiene and skin antiseptic. A timeout was performed prior to the initiation of the procedure. After anesthesia induction, patient developed profound hypotension requiring CPR. Normal pulse palpated after 2 minutes. Using the modified Seldinger technique and a micropuncture kit, access was gained to the right radial artery at the wrist and a 7 French sheath was placed. Real-time ultrasound guidance was utilized for vascular access including the acquisition of a permanent ultrasound image documenting patency of the accessed vessel. Then, a right radial artery angiogram was obtained via sheath side port. Normal brachial artery branching pattern seen. No significant anatomical variation. The right radial artery caliber is adequate for vascular access. Next, a 7 [place] Rist catheter was navigated over a 6 [place] Berenstein 2 catheter and a 0.035 "Terumo Glidewire into the right subclavian artery under fluoroscopic guidance. The catheter was then placed into the right common carotid artery and then advanced into the right internal carotid artery. Frontal  and lateral angiograms of the head were obtained. FINDINGS: 1. Patent right radial artery with standard anatomy. 2. There is an occlusion of the right M2/MCA dominant superior division branch. PROCEDURE: Using biplane roadmap guidance, a FreeClimb 54 aspiration catheter was navigated over Tenzing delivery catheter into the cavernous segment of the right ICA. The aspiration catheter was then advanced to the level of occlusion and connected to an aspiration pump. Continuous aspiration was performed for 2 minutes. The guide catheter was connected to  a VacLok syringe. The aspiration catheter was subsequently removed under constant aspiration. The guide catheter was aspirated for debris. Right internal carotid artery angiograms with frontal and lateral views of the head showed recanalization of the M2 segment with occlusion of 2 M3 segments. Using biplane roadmap guidance, a FreeClimb 54 aspiration catheter was navigated over a phenom 21 microcatheter and an Aristotle 14 microguidewire into the cavernous segment of the right ICA. The microcatheter was then navigated over the wire into the right M3/MCA. Then, a 3 mm solitaire stent retriever was deployed spanning the M2-M3 segment. The device was allowed to intercalated with the clot for 4 minutes. The microcatheter was removed. The aspiration catheter was advanced to the level of occlusion and connected to an aspiration pump. The thrombectomy device and aspiration catheter were removed under constant aspiration. The guide catheter was aspirated for debris. Right internal carotid artery angiograms with frontal lateral views of head showed recanalization of the M3 segment with nonocclusive residual filling defect. Using biplane roadmap guidance, a FreeClimb 54 aspiration catheter was navigated over a phenom 21 microcatheter and an Aristotle 14 microguidewire into the cavernous segment of the right ICA. The microcatheter was then navigated over the wire into the right  M3/MCA. Then, a 3 mm solitaire stent retriever was deployed spanning the M2-M3 segment. The device was allowed to intercalated with the clot for 4 minutes. The microcatheter was removed. The aspiration catheter was advanced to the level of occlusion and connected to an aspiration pump. The thrombectomy device and aspiration catheter were removed under constant aspiration. The guide catheter was aspirated for debris. Right internal carotid artery angiograms with frontal lateral views of head showed recanalization of the addressed M3 segment with persistent occlusion of a distal M3/MCA in the right frontal region with adequate retrograde collateral flow. The catheter was subsequently withdrawn. Flat panel CT of the head was obtained and post processed in a separate workstation with concurrent attending physician supervision. Selected images were sent to PACS. Trace hyperdensity in the right sylvian fissure suggesting minor contrast staining versus subarachnoid hemorrhage. An inflatable band was placed and inflated over the right wrist access site. The vascular sheath was withdrawn and the band was slowly deflated until brisk flow was noted through the arteriotomy site. At this point, the band was reinflated with additional 4 cc of air to obtain patent hemostasis. IMPRESSION: Successful mechanical thrombectomy for treatment of a proximal and dominant right M2/MCA superior division branch achieving TICI 2B recanalization. PLAN: Patient transferred to ICU for continued management. Electronically Signed   By: Baldemar Lenis M.D.   On: 02/17/2023 15:54   MR BRAIN WO CONTRAST  Result Date: 02/17/2023 CLINICAL DATA:  Stroke, follow up EXAM: MRI HEAD WITHOUT CONTRAST MRA HEAD WITHOUT CONTRAST TECHNIQUE: Multiplanar, multi-echo pulse sequences of the brain and surrounding structures were acquired without intravenous contrast. Angiographic images of the Circle of Willis were acquired using MRA technique  without intravenous contrast. COMPARISON:  None Available. FINDINGS: MRI HEAD FINDINGS Brain: Small acute infarcts in the right frontal and parietal lobes, largest in the right frontal lobe laterally. A few acute punctate infarcts in the occipital lobes bilaterally and right cerebellum. No mass occupying acute hemorrhage, mass lesion or midline shift. No hydrocephalus. Vascular: See below. Skull and upper cervical spine: Normal marrow signal. Sinuses/Orbits: No acute or significant finding. Other: No mastoid effusions. MRA HEAD FINDINGS Anterior circulation: Bilateral intracranial ICAs, MCAs, and ACAs are patent without proximal hemodynamically significant stenosis. Posterior circulation: Bilateral intradural  vertebral arteries, basilar artery and bilateral posterior cerebral arteries IMPRESSION: 1. Small acute infarcts in the right frontal and parietal lobes. 2. A few additional acute punctate infarcts in the occipital lobes bilaterally and right cerebellum. 3. No emergent large vessel occlusion or proximal hemodynamically significant stenosis. Electronically Signed   By: Feliberto Harts M.D.   On: 02/17/2023 15:21   MR ANGIO HEAD WO CONTRAST  Result Date: 02/17/2023 CLINICAL DATA:  Stroke, follow up EXAM: MRI HEAD WITHOUT CONTRAST MRA HEAD WITHOUT CONTRAST TECHNIQUE: Multiplanar, multi-echo pulse sequences of the brain and surrounding structures were acquired without intravenous contrast. Angiographic images of the Circle of Willis were acquired using MRA technique without intravenous contrast. COMPARISON:  None Available. FINDINGS: MRI HEAD FINDINGS Brain: Small acute infarcts in the right frontal and parietal lobes, largest in the right frontal lobe laterally. A few acute punctate infarcts in the occipital lobes bilaterally and right cerebellum. No mass occupying acute hemorrhage, mass lesion or midline shift. No hydrocephalus. Vascular: See below. Skull and upper cervical spine: Normal marrow signal.  Sinuses/Orbits: No acute or significant finding. Other: No mastoid effusions. MRA HEAD FINDINGS Anterior circulation: Bilateral intracranial ICAs, MCAs, and ACAs are patent without proximal hemodynamically significant stenosis. Posterior circulation: Bilateral intradural vertebral arteries, basilar artery and bilateral posterior cerebral arteries IMPRESSION: 1. Small acute infarcts in the right frontal and parietal lobes. 2. A few additional acute punctate infarcts in the occipital lobes bilaterally and right cerebellum. 3. No emergent large vessel occlusion or proximal hemodynamically significant stenosis. Electronically Signed   By: Feliberto Harts M.D.   On: 02/17/2023 15:21   DG CHEST PORT 1 VIEW  Result Date: 02/17/2023 CLINICAL DATA:  Acute stroke. EXAM: PORTABLE CHEST 1 VIEW COMPARISON:  02/16/2023 FINDINGS: Stable cardiac enlargement. Interval extubation. Interval placement of right upper extremity PICC line with the catheter tip in the SVC. Improved pulmonary aeration bilaterally with bibasilar atelectasis remaining, right greater than left. No overt pulmonary edema, pleural effusions or pneumothorax. IMPRESSION: Improved pulmonary aeration bilaterally with bibasilar atelectasis remaining, right greater than left. Interval extubation. PICC line tip in SVC. Electronically Signed   By: Irish Lack M.D.   On: 02/17/2023 08:18   ECHOCARDIOGRAM COMPLETE  Result Date: 02/16/2023    ECHOCARDIOGRAM REPORT   Patient Name:   LIZANDRA KITCHEL Date of Exam: 02/16/2023 Medical Rec #:  [id number]     Height:       62.0 in Accession #:    [id number]    Weight:       245.6 lb Date of Birth:  09-16-1971     BSA:          2.086 m Patient Age:    51 years      BP:           112/86 mmHg Patient Gender: F             HR:           94 bpm. Exam Location:  Inpatient Procedure: 2D Echo, Cardiac Doppler and Color Doppler Indications:    CHF-Acute Systolic I50.21  History:        Patient has prior history of Echocardiogram  examinations, most                 recent 02/07/2023. CHF, Stroke and Shock; Risk                 Factors:Hypertension, Diabetes and Former Smoker.  Sonographer:    Celesta Gentile RCS Referring Phys: [id number]  JINDONG XU  Sonographer Comments: Patient on mechanical ventilator during echo exam. IMPRESSIONS  1. Left ventricular ejection fraction, by estimation, is <20%. The left ventricle has severely decreased function. The left ventricle demonstrates global hypokinesis. The left ventricular internal cavity size was severely dilated.  2. Right ventricular systolic function is mildly reduced. The right ventricular size is normal.  3. Left atrial size was moderately dilated.  4. Mild mitral valve regurgitation.  5. The aortic valve is tricuspid. Aortic valve regurgitation is not visualized.  6. The inferior vena cava is normal in size with greater than 50% respiratory variability, suggesting right atrial pressure of 3 mmHg. Comparison(s): The left ventricular function is unchanged. FINDINGS  Left Ventricle: Left ventricular ejection fraction, by estimation, is <20%. The left ventricle has severely decreased function. The left ventricle demonstrates global hypokinesis. The left ventricular internal cavity size was severely dilated. There is no left ventricular hypertrophy. Right Ventricle: The right ventricular size is normal. Right vetricular wall thickness was not assessed. Right ventricular systolic function is mildly reduced. Left Atrium: Left atrial size was moderately dilated. Right Atrium: Right atrial size was normal in size. Pericardium: Trivial pericardial effusion is present. Mitral Valve: There is mild thickening of the mitral valve leaflet(s). There is mild calcification of the mitral valve leaflet(s). Mild mitral valve regurgitation. Tricuspid Valve: The tricuspid valve is normal in structure. Tricuspid valve regurgitation is trivial. Aortic Valve: The aortic valve is tricuspid. Aortic valve regurgitation is  not visualized. Pulmonic Valve: The pulmonic valve was normal in structure. Pulmonic valve regurgitation is trivial. Aorta: The aortic root is normal in size and structure. Venous: The inferior vena cava is normal in size with greater than 50% respiratory variability, suggesting right atrial pressure of 3 mmHg. IAS/Shunts: No atrial level shunt detected by color flow Doppler.  LEFT VENTRICLE PLAX 2D LVIDd:         6.70 cm LVIDs:         5.90 cm LV PW:         1.00 cm LV IVS:        0.90 cm LVOT diam:     1.90 cm LV SV:         41 LV SV Index:   20 LVOT Area:     2.84 cm  LV Volumes (MOD) LV vol d, MOD A2C: 196.0 ml LV vol d, MOD A4C: 237.0 ml LV vol s, MOD A2C: 127.0 ml LV vol s, MOD A4C: 175.0 ml LV SV MOD A2C:     69.0 ml LV SV MOD A4C:     237.0 ml LV SV MOD BP:      69.2 ml RIGHT VENTRICLE TAPSE (M-mode): 2.2 cm LEFT ATRIUM              Index        RIGHT ATRIUM           Index LA diam:        3.90 cm  1.87 cm/m   RA Area:     23.10 cm LA Vol (A2C):   72.5 ml  34.75 ml/m  RA Volume:   70.40 ml  33.75 ml/m LA Vol (A4C):   115.0 ml 55.13 ml/m LA Biplane Vol: 91.7 ml  43.96 ml/m  AORTIC VALVE LVOT Vmax:   105.30 cm/s LVOT Vmean:  76.667 cm/s LVOT VTI:    0.144 m  AORTA Ao Root diam: 3.20 cm MITRAL VALVE MV Area (PHT): 6.96 cm     SHUNTS MV  Decel Time: 109 msec     Systemic VTI:  0.14 m MV E velocity: 103.00 cm/s  Systemic Diam: 1.90 cm Dietrich Pates MD Electronically signed by Dietrich Pates MD Signature Date/Time: 02/16/2023/4:05:08 PM    Final    DG CHEST PORT 1 VIEW  Result Date: 02/16/2023 CLINICAL DATA:  ETT EXAM: PORTABLE CHEST 1 VIEW COMPARISON:  Feb 15, 2023 FINDINGS: The ETT is in good position. No pneumothorax. The lungs remain clear. Stable cardiomegaly. The hila and mediastinum are unchanged. No other acute abnormalities are identified. IMPRESSION: The ETT is in good position. No other interval changes. Electronically Signed   By: Gerome Sam III M.D.   On: 02/16/2023 08:56   [place] EKG SITE  RITE  Result Date: 02/16/2023 If Site Rite image not attached, placement could not be confirmed due to current cardiac rhythm.  DG CHEST PORT 1 VIEW  Result Date: 02/15/2023 CLINICAL DATA:  Check central line placement EXAM: PORTABLE CHEST 1 VIEW COMPARISON:  02/07/2023 FINDINGS: New right jugular central line is noted with the catheter tip in the region of the right innominate vein. Endotracheal tube is noted in satisfactory position. Cardiac shadow is enlarged but stable. The lungs are well aerated. Mild central vascular congestion is seen. No bony abnormality is noted. No pneumothorax is seen. IMPRESSION: No pneumothorax following central line placement. The central line tip is in the region of the right innominate vein. Mild vascular congestion. Electronically Signed   By: Alcide Clever M.D.   On: 02/15/2023 19:28   CT ANGIO HEAD NECK W WO CM  Result Date: 02/15/2023 CLINICAL DATA:  Provided history: Transient ischemic attack. Syncope, left-sided facial droop. EXAM: CT ANGIOGRAPHY HEAD AND NECK WITH AND WITHOUT CONTRAST TECHNIQUE: Multidetector CT imaging of the head and neck was performed using the standard protocol during bolus administration of intravenous contrast. Multiplanar CT image reconstructions and MIPs were obtained to evaluate the vascular anatomy. Carotid stenosis measurements (when applicable) are obtained utilizing NASCET criteria, using the distal internal carotid diameter as the denominator. RADIATION DOSE REDUCTION: This exam was performed according to the departmental dose-optimization program which includes automated exposure control, adjustment of the mA and/or kV according to patient size and/or use of iterative reconstruction technique. CONTRAST:  Administered contrast not known at this time. COMPARISON:  Head CT performed earlier today 02/15/2023. FINDINGS: CTA NECK FINDINGS Aortic arch: Standard aortic branching. Mild sclerotic plaque within the visualized aortic arch. No  hemodynamically significant innominate or proximal subclavian artery stenosis. Right carotid system: CCA and ICA patent within the neck without stenosis or significant atherosclerotic disease. Tortuosity of the cervical ICA. Left carotid system: CCA and ICA patent within the neck without stenosis or significant atherosclerotic disease. Tortuosity of the cervical ICA. Vertebral arteries: Patent within the neck without stenosis or significant atherosclerotic disease. The left vertebral artery is slightly dominant. Skeleton: No acute fracture. Other neck: No neck mass or cervical lymphadenopathy. Upper chest: No consolidation within the imaged lung apices. 10 mm cyst within the right upper lobe. Review of the MIP images confirms the above findings CTA HEAD FINDINGS Anterior circulation: The intracranial internal carotid arteries are patent. Nonstenotic atherosclerotic plaque within the left cavernous segment. The M1 middle cerebral arteries are patent. Abrupt occlusion of a proximal M2 right MCA vessel (series 12, image 44) (series 11, image 17). No left M2 proximal branch occlusion or high-grade proximal stenosis is identified. The anterior cerebral arteries are patent. Posterior circulation: The intracranial vertebral arteries are patent. The basilar artery is  patent. The posterior cerebral arteries are patent. A right posterior communicating artery is present. The left posterior communicating artery is diminutive or absent. Venous sinuses: Within the limitations of contrast timing, no convincing thrombus. Anatomic variants: As described. Review of the MIP images confirms the above findings CTA head impression #1 called by telephone at the time of interpretation on 02/15/2023 at 1:29 pm to provider Gloris Manchester , who verbally acknowledged these results. IMPRESSION: CTA neck: 1. The common carotid, internal carotid and vertebral arteries are patent within the neck without stenosis or significant atherosclerotic disease.  2.  Aortic Atherosclerosis (ICD10-I70.0). CTA head: 1. Abrupt occlusion of a proximal M2 right middle cerebral artery vessel. Neuro-interventional consultation recommended. 2. Mild non-stenotic atherosclerotic plaque within the cavernous left eye Electronically Signed   By: Jackey Loge D.O.   On: 02/15/2023 13:33   CT HEAD CODE STROKE WO CONTRAST  Result Date: 02/15/2023 CLINICAL DATA:  Code stroke. Neuro deficit, acute, stroke suspected. Syncope. Left-sided facial droop. EXAM: CT HEAD WITHOUT CONTRAST TECHNIQUE: Contiguous axial images were obtained from the base of the skull through the vertex without intravenous contrast. RADIATION DOSE REDUCTION: This exam was performed according to the departmental dose-optimization program which includes automated exposure control, adjustment of the mA and/or kV according to patient size and/or use of iterative reconstruction technique. COMPARISON:  Report from head CT 05/11/2001 (images unavailable) FINDINGS: Brain: Cerebral volume is normal. A small chronic lacunar infarct is questioned within the left corona radiata (for instance as seen on series 3, image 19) (series 5, image 32). There is no acute intracranial hemorrhage. No demarcated cortical infarct. No extra-axial fluid collection. No evidence of an intracranial mass. No midline shift. Vascular: No hyperdense vessel. Skull: No fracture or aggressive osseous lesion. Sinuses/Orbits: No mass or acute finding within the imaged orbits. No significant paranasal sinus disease at the imaged levels. ASPECTS [facility] Stroke Program Early CT Score) - Ganglionic level infarction (caudate, lentiform nuclei, internal capsule, insula, M1-M3 cortex): 7 - Supraganglionic infarction (M4-M6 cortex): 3 Total score (0-10 with 10 being normal): 10 These results were communicated to Dr. Roda Shutters At 1:04 pmon 5/11/2024by text page via the [facility] messaging system. IMPRESSION: 1. No evidence of an acute intracranial abnormality. ASPECTS is 10.  2. Possible small chronic lacunar infarct within the left corona radiata. Electronically Signed   By: Jackey Loge D.O.   On: 02/15/2023 13:15   MR CARDIAC MORPHOLOGY W WO CONTRAST  Result Date: 02/11/2023 CLINICAL DATA:  62F p/w acute heart failure. Echo shows EF <20%, mild RV dysfunction. LHC shows normal coronary arteries EXAM: CARDIAC MRI TECHNIQUE: The patient was scanned on a 1.5 Tesla Siemens magnet. A dedicated cardiac coil was used. Functional imaging was done using Fiesta sequences. 2,3, and 4 chamber views were done to assess for RWMA's. Modified Simpson's rule using a short axis stack was used to calculate an ejection fraction on a dedicated work [employer]. The patient received 10 cc of Gadavist. After 10 minutes inversion recovery sequences were used to assess for infiltration and scar tissue. Phase contrast velocity mapping was performed above the aortic and pulmonic valves CONTRAST:  10 cc  of Gadavist FINDINGS: Left ventricle: -Normal wall thickness -Severe dilatation -Severe systolic dysfunction -Normal ECV (28%) -RV insertion site LGE -Subendocardial LGE in focal region of mid inferior wall LV EF:  12% (Normal 52-79%) Absolute volumes: LV EDV: (Normal 78-167 mL) LV ESV: (Normal 21-64 mL) LV SV: 39mL (Normal 52-114 mL) CO: 4.4L/min (Normal  2.7-6.3 L/min) Indexed volumes: LV EDV: 177mL/sq-m (Normal 50-96 mL/sq-m) LV ESV: 127mL/sq-m (Normal 10-40 mL/sq-m) LV SV: 68mL/sq-m (Normal 33-64 mL/sq-m) CI: 2.0L/min/sq-m (Normal 1.9-3.9 L/min/sq-m) Right ventricle: Normal size with moderate systolic dysfunction RV EF: 31% (Normal 52-80%) Absolute volumes: RV EDV: (Normal 79-175 mL) RV ESV: 91mL (Normal 13-75 mL) RV SV: 40mL (Normal 56-110 mL) CO: 4.5L/min (Normal 2.7-6 L/min) Indexed volumes: RV EDV: 78mL/sq-m (Normal 51-97 mL/sq-m) RV ESV: 13mL/sq-m (Normal 9-42 mL/sq-m) RV SV: 86mL/sq-m (Normal 35-61 mL/sq-m) CI: 2.0L/min/sq-m (Normal 1.8-3.8 L/min/sq-m) Left atrium:  Mild enlargement Right atrium: Normal size Mitral valve: Mild regurgitation Aortic valve: Trivial regurgitation Tricuspid valve: Mild regurgitation Pulmonic valve: Trivial regurgitation Aorta: Normal proximal ascending aorta Pericardium: Small effusion IMPRESSION: 1. Severe LV dilatation, normal wall thickness, and severe systolic dysfunction (EF 12%) 2.  Normal RV size with moderate systolic dysfunction (EF 31%) 3. Subendocardial LGE in focal region of mid inferior wall. Subendocardial LGE is typically ischemic in etiology and this could represent a small infarct. However, sarcoidosis can also cause subendocardial LGE. Recommend cardiac FDG PET to evaluate for sarcoid. 4. RV insertion site LGE, which is a nonspecific scar pattern often seen in setting of elevated pulmonary pressures Electronically Signed   By: Epifanio Lesches M.D.   On: 02/11/2023 22:21   MR CARDIAC VELOCITY FLOW MAP  Result Date: 02/11/2023 CLINICAL DATA:  73F p/w acute heart failure. Echo shows EF <20%, mild RV dysfunction. LHC shows normal coronary arteries EXAM: CARDIAC MRI TECHNIQUE: The patient was scanned on a 1.5 Tesla Siemens magnet. A dedicated cardiac coil was used. Functional imaging was done using Fiesta sequences. 2,3, and 4 chamber views were done to assess for RWMA's. Modified Simpson's rule using a short axis stack was used to calculate an ejection fraction on a dedicated work [employer], [employer]. The patient received 10 cc of Gadavist. After 10 minutes inversion recovery sequences were used to assess for infiltration and scar tissue. Phase contrast velocity mapping was performed above the aortic and pulmonic valves CONTRAST:  10 cc  of Gadavist FINDINGS: Left ventricle: -Normal wall thickness -Severe dilatation -Severe systolic dysfunction -Normal ECV (28%) -RV insertion site LGE -Subendocardial LGE in focal region of mid inferior wall LV EF:  12% (Normal 52-79%) Absolute volumes: LV EDV: (Normal 78-167  mL) LV ESV: (Normal 21-64 mL) LV SV: 39mL (Normal 52-114 mL) CO: 4.4L/min (Normal 2.7-6.3 L/min) Indexed volumes: LV EDV: 114mL/sq-m (Normal 50-96 mL/sq-m) LV ESV: 164mL/sq-m (Normal 10-40 mL/sq-m) LV SV: 50mL/sq-m (Normal 33-64 mL/sq-m) CI: 2.0L/min/sq-m (Normal 1.9-3.9 L/min/sq-m) Right ventricle: Normal size with moderate systolic dysfunction RV EF: 31% (Normal 52-80%) Absolute volumes: RV EDV: (Normal 79-175 mL) RV ESV: 91mL (Normal 13-75 mL) RV SV: 40mL (Normal 56-110 mL) CO: 4.5L/min (Normal 2.7-6 L/min) Indexed volumes: RV EDV: 75mL/sq-m (Normal 51-97 mL/sq-m) RV ESV: 60mL/sq-m (Normal 9-42 mL/sq-m) RV SV: 26mL/sq-m (Normal 35-61 mL/sq-m) CI: 2.0L/min/sq-m (Normal 1.8-3.8 L/min/sq-m) Left atrium: Mild enlargement Right atrium: Normal size Mitral valve: Mild regurgitation Aortic valve: Trivial regurgitation Tricuspid valve: Mild regurgitation Pulmonic valve: Trivial regurgitation Aorta: Normal proximal ascending aorta Pericardium: Small effusion IMPRESSION: 1. Severe LV dilatation, normal wall thickness, and severe systolic dysfunction (EF 12%) 2.  Normal RV size with moderate systolic dysfunction (EF 31%) 3. Subendocardial LGE in focal region of mid inferior wall. Subendocardial LGE is typically ischemic in etiology and this could represent a small infarct. However, sarcoidosis can also cause subendocardial LGE. Recommend cardiac FDG PET to evaluate for sarcoid. 4. RV insertion site  LGE, which is a nonspecific scar pattern often seen in setting of elevated pulmonary pressures Electronically Signed   By: Epifanio Lesches M.D.   On: 02/11/2023 22:21   MR CARDIAC VELOCITY FLOW MAP  Result Date: 02/11/2023 CLINICAL DATA:  41F p/w acute heart failure. Echo shows EF <20%, mild RV dysfunction. LHC shows normal coronary arteries EXAM: CARDIAC MRI TECHNIQUE: The patient was scanned on a 1.5 Tesla Siemens magnet. A dedicated cardiac coil was used. Functional imaging was done using Fiesta sequences.  2,3, and 4 chamber views were done to assess for RWMA's. Modified Simpson's rule using a short axis stack was used to calculate an ejection fraction on a dedicated work [employer], [employer]. The patient received 10 cc of Gadavist. After 10 minutes inversion recovery sequences were used to assess for infiltration and scar tissue. Phase contrast velocity mapping was performed above the aortic and pulmonic valves CONTRAST:  10 cc  of Gadavist FINDINGS: Left ventricle: -Normal wall thickness -Severe dilatation -Severe systolic dysfunction -Normal ECV (28%) -RV insertion site LGE -Subendocardial LGE in focal region of mid inferior wall LV EF:  12% (Normal 52-79%) Absolute volumes: LV EDV: (Normal 78-167 mL) LV ESV: (Normal 21-64 mL) LV SV: 39mL (Normal 52-114 mL) CO: 4.4L/min (Normal 2.7-6.3 L/min) Indexed volumes: LV EDV: 139mL/sq-m (Normal 50-96 mL/sq-m) LV ESV: 164mL/sq-m (Normal 10-40 mL/sq-m) LV SV: 60mL/sq-m (Normal 33-64 mL/sq-m) CI: 2.0L/min/sq-m (Normal 1.9-3.9 L/min/sq-m) Right ventricle: Normal size with moderate systolic dysfunction RV EF: 31% (Normal 52-80%) Absolute volumes: RV EDV: (Normal 79-175 mL) RV ESV: 91mL (Normal 13-75 mL) RV SV: 40mL (Normal 56-110 mL) CO: 4.5L/min (Normal 2.7-6 L/min) Indexed volumes: RV EDV: 52mL/sq-m (Normal 51-97 mL/sq-m) RV ESV: 46mL/sq-m (Normal 9-42 mL/sq-m) RV SV: 60mL/sq-m (Normal 35-61 mL/sq-m) CI: 2.0L/min/sq-m (Normal 1.8-3.8 L/min/sq-m) Left atrium: Mild enlargement Right atrium: Normal size Mitral valve: Mild regurgitation Aortic valve: Trivial regurgitation Tricuspid valve: Mild regurgitation Pulmonic valve: Trivial regurgitation Aorta: Normal proximal ascending aorta Pericardium: Small effusion IMPRESSION: 1. Severe LV dilatation, normal wall thickness, and severe systolic dysfunction (EF 12%) 2.  Normal RV size with moderate systolic dysfunction (EF 31%) 3. Subendocardial LGE in focal region of mid inferior wall. Subendocardial LGE is  typically ischemic in etiology and this could represent a small infarct. However, sarcoidosis can also cause subendocardial LGE. Recommend cardiac FDG PET to evaluate for sarcoid. 4. RV insertion site LGE, which is a nonspecific scar pattern often seen in setting of elevated pulmonary pressures Electronically Signed   By: Epifanio Lesches M.D.   On: 02/11/2023 22:21   CARDIAC CATHETERIZATION  Result Date: 02/10/2023   LV end diastolic pressure is mildly elevated.   Hemodynamic findings consistent with mild pulmonary hypertension. Normal coronary anatomy Mild LV filling pressures. PCWP 23/21 with mean 20 mm Hg. LVEDP 21 mm Hg Mild pulmonary HTN PAP 42/22 with mean 30 mm Hg Cardiac output 4.81 L/min with index 2.26. Plan: optimize CHF therapy   ECHOCARDIOGRAM COMPLETE  Result Date: 02/07/2023    ECHOCARDIOGRAM REPORT   Patient Name:   GENEIVEVE DEUS Date of Exam: 02/07/2023 Medical Rec #:  [id number]     Height:       62.5 in Accession #:    [id number]    Weight:       257.7 lb Date of Birth:  06/24/1971     BSA:          2.142 m Patient Age:    51 years      BP:  141/113 mmHg Patient Gender: F             HR:           114 bpm. Exam Location:  Inpatient Procedure: 2D Echo, 3D Echo, Cardiac Doppler, Color Doppler and Strain Analysis Indications:    Congestive Heart Failure I50.9  History:        Patient has no prior history of Echocardiogram examinations.                 CHF; Risk Factors:Former Smoker, Hypertension and Diabetes.  Sonographer:    Dondra Prader RVT RCS Referring Phys: [id number] JENNIFER YATES IMPRESSIONS  1. Left ventricular ejection fraction, by estimation, is <20%. The left ventricle has severely decreased function. The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

## 2023-02-19 NOTE — Progress Notes (Addendum)
Advanced Heart Failure Rounding Note  PCP-Cardiologist: Rollene Rotunda, MD   Subjective:    Transferred out of ICU yesterday. Feels "great". Pleased w/ her progress. Working w/ PT ambulating well but still w/ some mild dyspnea w/ exertion.   Tolerating GDMT. SPBs soft yesterday, low 100s-110s. 130/93 on AM vitals check.  Scr 0.85, K 4.0   No current CVP set up.    Objective:   Weight Range: 111.4 kg Body mass index is 44.92 kg/m.   Vital Signs:   Temp:  [97.9 F (36.6 C)-99.2 F (37.3 C)] 97.9 F (36.6 C) (05/15 0313) Pulse Rate:  [60-112] 101 (05/15 0313) Resp:  [16-30] 18 (05/15 0837) BP: (101-138)/(63-96) 138/93 (05/15 0837) SpO2:  [91 %-97 %] 97 % (05/15 0837) Last BM Date : 02/18/23  Weight change: Filed Weights   02/15/23 1333  Weight: 111.4 kg    Intake/Output:   Intake/Output Summary (Last 24 hours) at 02/19/2023 0936 Last data filed at 02/19/2023 0107 Gross per 24 hour  Intake 446 ml  Output 1450 ml  Net -1004 ml      Physical Exam   General:  Well appearing obese, middle aged AAF. No respiratory difficulty HEENT: normal Neck: supple. JVD ~7 cm. Carotids 2+ bilat; no bruits. No lymphadenopathy or thyromegaly appreciated. Cor: PMI nondisplaced. Regular rate & rhythm. No rubs, gallops or murmurs. Lungs: clear Abdomen: obese, soft, nontender, nondistended. No hepatosplenomegaly. No bruits or masses. Good bowel sounds. Extremities: no cyanosis, clubbing, rash, edema Neuro: alert & oriented x 3, cranial nerves grossly intact. moves all 4 extremities w/o difficulty. Affect pleasant.   Telemetry   NSR 90s   EKG    N/A   Labs    CBC Recent Labs    02/17/23 0601 02/19/23 0605  WBC 12.8* 8.7  HGB 11.6* 11.5*  HCT 37.5 36.1  MCV 85.2 83.2  PLT 207 189   Basic Metabolic Panel Recent Labs    40/98/11 0945 02/17/23 0601 02/18/23 0951 02/19/23 0605  NA 140 133* 135 139  K 3.3* 3.8 3.3* 4.0  CL 102 102 103 105  CO2 25 26 25 26    GLUCOSE 181* 116* 103* 93  BUN 20 11 13 12   CREATININE 1.17* 0.85 0.85 0.85  CALCIUM 9.6 9.5 9.8 9.6  MG 2.3 2.2  --   --   PHOS 3.0 2.6  --   --    Liver Function Tests Recent Labs    02/16/23 0945 02/17/23 0601  AST 21 17  ALT 24 21  ALKPHOS 66 65  BILITOT 0.6 1.0  PROT 7.7 7.0  ALBUMIN 3.3* 3.1*   No results for input(s): "LIPASE", "AMYLASE" in the last 72 hours. Cardiac Enzymes No results for input(s): "CKTOTAL", "CKMB", "CKMBINDEX", "TROPONINI" in the last 72 hours.  BNP: BNP (last 3 results) Recent Labs    02/07/23 0947 02/15/23 2020  BNP 1,470.5* 1,305.5*    ProBNP (last 3 results) No results for input(s): "PROBNP" in the last 8760 hours.   D-Dimer No results for input(s): "DDIMER" in the last 72 hours. Hemoglobin A1C No results for input(s): "HGBA1C" in the last 72 hours.  Fasting Lipid Panel Recent Labs    02/16/23 0945  CHOL 155  HDL 45  LDLCALC 98  TRIG 62  CHOLHDL 3.4   Thyroid Function Tests No results for input(s): "TSH", "T4TOTAL", "T3FREE", "THYROIDAB" in the last 72 hours.  Invalid input(s): "FREET3"  Other results:   Imaging    No results found.  Medications:     Scheduled Medications:  apixaban  5 mg Oral BID   Chlorhexidine Gluconate Cloth  6 each Topical Daily   dapagliflozin propanediol  10 mg Oral Daily   digoxin  0.125 mg Oral Daily   docusate sodium  100 mg Oral BID   insulin aspart  0-15 Units Subcutaneous TID WC   insulin aspart  0-5 Units Subcutaneous QHS   polyethylene glycol  17 g Oral Daily   rosuvastatin  20 mg Oral Daily   spironolactone  12.5 mg Oral Daily    Infusions:  sodium chloride Stopped (02/16/23 0138)    PRN Medications: sodium chloride, acetaminophen **OR** acetaminophen (TYLENOL) oral liquid 160 mg/5 mL **OR** acetaminophen, mouth rinse, senna-docusate    Patient Profile   Stephanie Reilly is a 52 y.o. female with hypertension, hyperlipidemia, type 2 diabetes, obesity and recently  diagnosed systolic heart failure currently admitted with stroke. On arrival code stroke was called with imaging demonstrating occlusion of the proximal right M2. She was sent to interventional radiology and intubated. Immediately postintubation she had PEA arrest requiring 2 minutes of CPR and epinephrine prior to ROSC. Since that time she has undergone diagnostic cerebral angiogram and thrombectomy for the right M2 occlusion now status post complete recanalization.   Assessment/Plan   PEA arrest w/ recently diagnosed systolic heart failure - PEA arrest likely after induction for intubation. Now extubated. Sats stable.   - Newly diagnosed HFrEF earlier this month. ECHO preserved RV function, LVEF 15%. LHC /RHC with normal cors. MRI- LV EF 12% RV moderate dysfunction. Subendocardial LGE - ? Small infarct possible sarcoid. Will need eventual PET scan.  - BP stable off NE.  - Volume status stable on exam - Hold off on bb.  - Continue digoxin 0.125 mcg.  - Increase Spiro to 25 mg daily  - Continue farxiga 10 mg daily.  - Consider restarting Entresto today, will defer to Dr. Shirlee Latch     2. Right M2 stroke - s/p mechanical thrombectomy with small SAH - suspect cardio embolic  - Neuro started on eliquis.  - On statin.  - No residual deficits.    3. Acute Renal failure - Resolved.   4. DMII - Hgb A1C 6.1  - On SSI  - on Farxiga  - Body mass index is 44.92 kg/m., Consider future GLP1   5. H/O NSVT /PVCs  - During recent hospitalization had > 40 beats NSVT, asymptomatic.  Dr Elberta Fortis EP saw plan for GDMT  - Life Vest ordered and has been delivered.  - Not enough PVCs currently to warrant amiodarone.   Length of Stay: [address], [place]  02/19/2023, 9:36 AM  Advanced Heart Failure Team Pager [phone] (M-F; 7a - 5p)  Please contact [facility] for night-coverage after hours (5p -7a ) and weekends on [email or web address]  Patient seen with PA, agree with the above note.   She feels  good today, no complaints.  Says she has no obvious residual effects from stroke.   General: NAD Neck: No JVD, no thyromegaly or thyroid nodule.  Lungs: Clear to auscultation bilaterally with normal respiratory effort. CV: Nondisplaced PMI.  Heart regular S1/S2, no S3/S4, no murmur.  No peripheral edema.   Abdomen: Soft, nontender, no hepatosplenomegaly, no distention.  Skin: Intact without lesions or rashes.  Neurologic: Alert and oriented x 3.  Psych: Normal affect. Extremities: No clubbing or cyanosis.  HEENT: Normal.   Continue Eliquis for presumed cardio-embolic CVA.   She had a  rash but not angioedema with lisinopril.  She took Entresto prior to admission with no development of rash.  I will restart her on Entresto 24/26 bid.   She will wear Lifevest at home while we adjust GDMT to see if she needs ICD.  She had long NSVT run in the past.   Should be ready for home soon.   Marca Ancona 02/19/2023 11:59 AM

## 2023-02-20 DIAGNOSIS — I63411 Cerebral infarction due to embolism of right middle cerebral artery: Secondary | ICD-10-CM

## 2023-02-20 DIAGNOSIS — I5021 Acute systolic (congestive) heart failure: Secondary | ICD-10-CM | POA: Diagnosis not present

## 2023-02-20 DIAGNOSIS — I5043 Acute on chronic combined systolic (congestive) and diastolic (congestive) heart failure: Secondary | ICD-10-CM | POA: Diagnosis not present

## 2023-02-20 DIAGNOSIS — I5022 Chronic systolic (congestive) heart failure: Secondary | ICD-10-CM | POA: Diagnosis not present

## 2023-02-20 LAB — GLUCOSE, CAPILLARY
Glucose-Capillary: 104 mg/dL — ABNORMAL HIGH (ref 70–99)
Glucose-Capillary: 117 mg/dL — ABNORMAL HIGH (ref 70–99)

## 2023-02-20 LAB — BASIC METABOLIC PANEL
Anion gap: 9 (ref 5–15)
BUN: 10 mg/dL (ref 6–20)
CO2: 24 mmol/L (ref 22–32)
Calcium: 9.4 mg/dL (ref 8.9–10.3)
Chloride: 104 mmol/L (ref 98–111)
Creatinine, Ser: 0.85 mg/dL (ref 0.44–1.00)
GFR, Estimated: >60 mL/min (ref >60)
Glucose, Bld: 113 mg/dL — ABNORMAL HIGH (ref 70–99)
Potassium: 3.5 mmol/L (ref 3.5–5.1)
Sodium: 137 mmol/L (ref 135–145)

## 2023-02-20 MED ORDER — TORSEMIDE 20 MG PO TABS: 20.0000 mg | ORAL_TABLET | Freq: Every day | ORAL | Status: DC | PRN

## 2023-02-20 MED ORDER — POLYETHYLENE GLYCOL 3350 17 G PO PACK: 17.0000 g | PACK | Freq: Every day | ORAL | 0 refills | Status: DC

## 2023-02-20 MED ORDER — DIGOXIN 125 MCG PO TABS: 0.1250 mg | ORAL_TABLET | Freq: Every day | ORAL | 0 refills | Status: DC

## 2023-02-20 MED ORDER — ROSUVASTATIN CALCIUM 20 MG PO TABS: 20.0000 mg | ORAL_TABLET | Freq: Every day | ORAL | 0 refills | Status: DC

## 2023-02-20 MED ORDER — SACUBITRIL-VALSARTAN 24-26 MG PO TABS: 1.0000 | ORAL_TABLET | Freq: Two times a day (BID) | ORAL | 0 refills | Status: DC

## 2023-02-20 MED ORDER — POTASSIUM CHLORIDE CRYS ER 20 MEQ PO TBCR
40.0000 meq | EXTENDED_RELEASE_TABLET | Freq: Once | ORAL | Status: AC
  Administered 2023-02-20: 40 meq via ORAL
  Filled 2023-02-20: qty 2

## 2023-02-20 MED ORDER — CARVEDILOL 3.125 MG PO TABS: 3.1250 mg | ORAL_TABLET | Freq: Two times a day (BID) | ORAL | Status: DC

## 2023-02-20 MED ORDER — SENNOSIDES-DOCUSATE SODIUM 8.6-50 MG PO TABS: 1.0000 | ORAL_TABLET | Freq: Every evening | ORAL | 0 refills | Status: AC | PRN

## 2023-02-20 MED ORDER — CARVEDILOL 3.125 MG PO TABS: 3.1250 mg | ORAL_TABLET | Freq: Two times a day (BID) | ORAL | 0 refills | Status: DC

## 2023-02-20 MED ORDER — APIXABAN 5 MG PO TABS: 5.0000 mg | ORAL_TABLET | Freq: Two times a day (BID) | ORAL | 0 refills | Status: DC

## 2023-02-20 MED ORDER — TORSEMIDE 20 MG PO TABS: 20.0000 mg | ORAL_TABLET | Freq: Every day | ORAL | 0 refills | Status: DC | PRN

## 2023-02-20 NOTE — Plan of Care (Signed)
Problem: Education: Goal: Understanding of CV disease, CV risk reduction, and recovery process will improve Outcome: Adequate for Discharge Goal: Individualized Educational Video(s) Outcome: Adequate for Discharge   Problem: Activity: Goal: Ability to return to baseline activity level will improve Outcome: Adequate for Discharge   Problem: Cardiovascular: Goal: Ability to achieve and maintain adequate cardiovascular perfusion will improve Outcome: Adequate for Discharge Goal: Vascular access site(s) Level 0-1 will be maintained Outcome: Adequate for Discharge   Problem: Health Behavior/Discharge Planning: Goal: Ability to safely manage health-related needs after discharge will improve Outcome: Adequate for Discharge   Problem: Education: Goal: Knowledge of disease or condition will improve Outcome: Adequate for Discharge Goal: Knowledge of secondary prevention will improve (MUST DOCUMENT ALL) Outcome: Adequate for Discharge Goal: Knowledge of patient specific risk factors will improve (Mark N/A or DELETE if not current risk factor) Outcome: Adequate for Discharge   Problem: Ischemic Stroke/TIA Tissue Perfusion: Goal: Complications of ischemic stroke/TIA will be minimized Outcome: Adequate for Discharge   Problem: Coping: Goal: Will verbalize positive feelings about self Outcome: Adequate for Discharge Goal: Will identify appropriate support needs Outcome: Adequate for Discharge   Problem: Health Behavior/Discharge Planning: Goal: Ability to manage health-related needs will improve Outcome: Adequate for Discharge Goal: Goals will be collaboratively established with patient/family Outcome: Adequate for Discharge   Problem: Self-Care: Goal: Ability to participate in self-care as condition permits will improve Outcome: Adequate for Discharge Goal: Verbalization of feelings and concerns over difficulty with self-care will improve Outcome: Adequate for Discharge Goal:  Ability to communicate needs accurately will improve Outcome: Adequate for Discharge   Problem: Nutrition: Goal: Risk of aspiration will decrease Outcome: Adequate for Discharge Goal: Dietary intake will improve Outcome: Adequate for Discharge   Problem: Education: Goal: Ability to describe self-care measures that may prevent or decrease complications (Diabetes Survival Skills Education) will improve Outcome: Adequate for Discharge Goal: Individualized Educational Video(s) Outcome: Adequate for Discharge   Problem: Coping: Goal: Ability to adjust to condition or change in health will improve Outcome: Adequate for Discharge   Problem: Fluid Volume: Goal: Ability to maintain a balanced intake and output will improve Outcome: Adequate for Discharge   Problem: Health Behavior/Discharge Planning: Goal: Ability to identify and utilize available resources and services will improve Outcome: Adequate for Discharge Goal: Ability to manage health-related needs will improve Outcome: Adequate for Discharge   Problem: Metabolic: Goal: Ability to maintain appropriate glucose levels will improve Outcome: Adequate for Discharge   Problem: Nutritional: Goal: Maintenance of adequate nutrition will improve Outcome: Adequate for Discharge Goal: Progress toward achieving an optimal weight will improve Outcome: Adequate for Discharge   Problem: Skin Integrity: Goal: Risk for impaired skin integrity will decrease Outcome: Adequate for Discharge   Problem: Tissue Perfusion: Goal: Adequacy of tissue perfusion will improve Outcome: Adequate for Discharge   Problem: Education: Goal: Knowledge of General Education information will improve Description: Including pain rating scale, medication(s)/side effects and non-pharmacologic comfort measures Outcome: Adequate for Discharge   Problem: Health Behavior/Discharge Planning: Goal: Ability to manage health-related needs will improve Outcome:  Adequate for Discharge   Problem: Clinical Measurements: Goal: Ability to maintain clinical measurements within normal limits will improve Outcome: Adequate for Discharge Goal: Will remain free from infection Outcome: Adequate for Discharge Goal: Diagnostic test results will improve Outcome: Adequate for Discharge Goal: Respiratory complications will improve Outcome: Adequate for Discharge Goal: Cardiovascular complication will be avoided Outcome: Adequate for Discharge   Problem: Activity: Goal: Risk for activity intolerance will decrease Outcome: Adequate for   Discharge   Problem: Nutrition: Goal: Adequate nutrition will be maintained Outcome: Adequate for Discharge   Problem: Coping: Goal: Level of anxiety will decrease Outcome: Adequate for Discharge   Problem: Elimination: Goal: Will not experience complications related to bowel motility Outcome: Adequate for Discharge Goal: Will not experience complications related to urinary retention Outcome: Adequate for Discharge   Problem: Pain Managment: Goal: General experience of comfort will improve Outcome: Adequate for Discharge   Problem: Safety: Goal: Ability to remain free from injury will improve Outcome: Adequate for Discharge   Problem: Skin Integrity: Goal: Risk for impaired skin integrity will decrease Outcome: Adequate for Discharge   

## 2023-02-20 NOTE — Progress Notes (Signed)
Nursing Discharge Note   Admit Date: 02/15/2023  Discharge date: 02/20/2023  Stephanie Reilly is to be discharged home per MD order.  AVS completed. Reviewed with patient at bedside. Highlighted copy provided for patient to take home.  Patient able to verbalize understanding of discharge instructions. PIV removed. Patient stable upon discharge.   Patient discharging home with LifeVest on and in place. Discharged home with bedside commode and rolling walker.   Discharge Instructions     Ambulatory referral to Neurology   Complete by: As directed    An appointment is requested in approximately: 8 weeks   Call MD for:   Complete by: As directed    Call 911 for sudden headache, onset of trouble speaking, facial droop, weakness to one side of the body, dizziness, extreme nausea/vomiting, chest pain, shortness of breath.   Call MD for:  extreme fatigue   Complete by: As directed    Call MD for:  persistant dizziness or light-headedness   Complete by: As directed    Call MD for:  persistant nausea and vomiting   Complete by: As directed    Call MD for:  severe uncontrolled pain   Complete by: As directed    Call MD for:  temperature >100.4   Complete by: As directed    Diet - low sodium heart healthy   Complete by: As directed    Discharge instructions   Complete by: As directed    You are being discharged from [facility] after being admitted for stroke. You underwent a thrombectomy. 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 During your stay heart failure was consulted and changed some of your medications.. Please make sure to review the discharge paperwork to ensure that you are taking your medications as prescribed. You are also started on Eliquis for secondary stroke prevention. Please continue to monitor your blood pressure, blood sugar, cholesterol level as these are all stroke risk factors. Try to limit stress as  possible, stay hydrated, eat a healthy diet, exercise daily. You will need to follow-up with your primary care provider. You also need to follow-up with neurology within 8 weeks. Heart failure will contact you for follow-up appointment as well.   Increase activity slowly   Complete by: As directed    Increase activity slowly   Complete by: As directed    Increase activity slowly   Complete by: As directed        Allergies as of 02/20/2023       Reactions   Paroxetine Hcl Anaphylaxis   Prednisone Shortness Of Breath, Other (See Comments)   Confusion, "messes with mental," muscles feel weird, fatigue, insomnia   Chocolate Other (See Comments)   Unknown    Ibuprofen Other (See Comments)   Unknown    Iron Other (See Comments)   GI upset (reported 08/04/2017). Pt clarifies had constipation, nausea.  Stool softeners did not help.    Latex Other (See Comments)   Unknown    Lisinopril Hives   Naproxen Other (See Comments)   Unknown    Tramadol Hives   Tylenol [acetaminophen] Other (See Comments)   With Codeine; GI    Penicillins Rash   Serotonin Nausea Only, Swelling, Anxiety, Rash, Other (See Comments)   GI intolerence        Medication List     STOP taking these medications    acetaminophen 500 MG tablet Commonly known as: TYLENOL   Aspirin  Low Dose 81 MG chewable tablet Generic drug: aspirin   metoprolol succinate 50 MG 24 hr tablet Commonly known as: TOPROL-XL   Vitamin E 45 MG (100 UNIT) Caps       TAKE these medications    apixaban 5 MG Tabs tablet Commonly known as: ELIQUIS Take 1 tablet (5 mg total) by mouth 2 (two) times daily.   Biotin Maximum Strength 10 MG Tabs Generic drug: Biotin Take 10 mg by mouth daily.   carvedilol 3.125 MG tablet Commonly known as: COREG Take 1 tablet (3.125 mg total) by mouth 2 (two) times daily with a meal.   cetirizine 10 MG tablet Commonly known as: ZYRTEC Take 10 mg by mouth daily.   digoxin 0.125 MG  tablet Commonly known as: LANOXIN Take 1 tablet (0.125 mg total) by mouth daily. Start taking on: Feb 21, 2023   Farxiga 10 MG Tabs tablet Generic drug: dapagliflozin propanediol Take 1 tablet (10 mg total) by mouth daily.   HAIR SKIN & NAILS PO Take 2 tablets by mouth daily.   polyethylene glycol 17 g packet Commonly known as: MIRALAX / GLYCOLAX Take 17 g by mouth daily. Start taking on: Feb 21, 2023   rosuvastatin 20 MG tablet Commonly known as: CRESTOR Take 1 tablet (20 mg total) by mouth daily. Start taking on: Feb 21, 2023   sacubitril-valsartan 24-26 MG Commonly known as: ENTRESTO Take 1 tablet by mouth 2 (two) times daily.   senna-docusate 8.6-50 MG tablet Commonly known as: Senokot-S Take 1 tablet by mouth at bedtime as needed for mild constipation.   spironolactone 25 MG tablet Commonly known as: ALDACTONE Take 1 tablet (25 mg total) by mouth daily.   torsemide 20 MG tablet Commonly known as: DEMADEX Take 1 tablet (20 mg total) by mouth daily as needed (for increased edema). What changed:  how much to take when to take this reasons to take this   vitamin C 100 MG tablet Take 100 mg by mouth daily.               Durable Medical Equipment  (From admission, onward)           Start     Ordered   02/18/23 1235  For home use only DME Walker rolling  Once       Question Answer Comment  Walker: With 5 Inch Wheels   Patient needs a walker to treat with the following condition Heart failure (HCC)   Patient needs a walker to treat with the following condition Acute right MCA stroke (HCC)      02/18/23 1236   02/18/23 1227  For home use only DME Bedside commode  Once       Question Answer Comment  Patient needs a bedside commode to treat with the following condition Heart failure [facility])   Patient needs a bedside commode to treat with the following condition Acute right MCA stroke (HCC)      02/18/23 1236   02/17/23 1805  For home use only DME Vest  life vest  Once       Comments: ZOLL Life Vest   Identify indication. Type yes or no  Cardiac Arrest due to VF or sustained VT:  Familial or inherited condition with SCA risk: MI with an EF < 35% or DCM with an EF < 35%: Yes  ICD Explanation:  Other condition with high risk of VT/VF:   Life Vest Setting  VT 150 bpm VF 200 BPM 150Jx5 Length  of need: 3 months  Start Date: 02/18/23   02/17/23 1805             Discharge Instructions/ Education: Discharge instructions given to patient with verbalized understanding. Discharge education completed with patient including: follow up instructions, medication list, discharge activities, and limitations if indicated.  Patient able to verbalize understanding, all questions fully answered. Patient instructed to return to Emergency Department, call 911, or call MD for any changes in condition.  Patient escorted via wheelchair to lobby and discharged home via private automobile.

## 2023-02-20 NOTE — TOC Transition Note (Addendum)
Transition of Care [facility]) - CM/SW Discharge Note   Patient Details  Name: Stephanie Reilly MRN: [id number] Date of Birth: 1971/07/29  [facility] CM/SW Contact:  Kermit Balo, RN Phone Number: 02/20/2023, 2:24 PM   Clinical Narrative:    Pt is discharging home with self care. Per prior CM note walker and BSC ordered through [employer] and should have been delivered to the room.  Pt has [employer] life vest at the bedside.  CM provided her with 30 day free Eliquis coupon. Pt has transportation home.   Final next level of care: Home/Self Care Barriers to Discharge: No Barriers Identified   Patient Goals and CMS Choice CMS Medicare.gov Compare Post Acute Care list provided to:: Patient    Discharge Placement                         Discharge Plan and Services Additional resources added to the After Visit Summary for     Discharge Planning Services: CM Consult            DME Arranged: Life vest, Walker rolling, 3-N-1 DME Agency: Rol[facility] G[facility], [employer] Date DME Agency Contacted: 02/18/23 Time DME Agency Contacted: 1218 Representative spoke with at DME Agency: Luberta Robertson            Social Determinants of Health (SDOH) Interventions SDOH Screenings   Food Insecurity: No Food Insecurity (02/15/2023)  Housing: Low Risk  (02/15/2023)  Transportation Needs: No Transportation Needs (02/15/2023)  Utilities: Not At Risk (02/15/2023)  Alcohol Screen: Low Risk  (02/11/2023)  Financial Resource Strain: Low Risk  (02/11/2023)  Tobacco Use: Medium Risk (02/18/2023)     Readmission Risk Interventions     No data to display

## 2023-02-20 NOTE — Progress Notes (Addendum)
Advanced Heart Failure Rounding Note  PCP-Cardiologist: Rollene Rotunda, MD   Subjective:    Tolerating GDMT titration. Entresto restarted yesterday. Scr and K stable. SBPs 120s-140s. Diastolic BP high at times, 90s-low 100s.   Had 4 beats of NSVT on tele overnight, asymptomatic. LifeVest for home has been delivered.   K 3.5   Feels well. Denies resting dyspnea. No residual deficits from stroke.    Objective:   Weight Range: 111.4 kg Body mass index is 44.92 kg/m.   Vital Signs:   Temp:  [97.9 F (36.6 C)-99.2 F (37.3 C)] 97.9 F (36.6 C) (05/15 0313) Pulse Rate:  [60-112] 101 (05/15 0313) Resp:  [16-30] 18 (05/15 0837) BP: (101-138)/(63-96) 138/93 (05/15 0837) SpO2:  [91 %-97 %] 97 % (05/15 0837) Last BM Date : 02/18/23  Weight change: Filed Weights   02/15/23 1333  Weight: 111.4 kg    Intake/Output:   Intake/Output Summary (Last 24 hours) at 02/19/2023 0936 Last data filed at 02/19/2023 0107 Gross per 24 hour  Intake 446 ml  Output 1450 ml  Net -1004 ml      Physical Exam   General:  Well appearing, obese. No respiratory difficulty HEENT: normal Neck: supple. no JVD. Carotids 2+ bilat; no bruits. No lymphadenopathy or thyromegaly appreciated. Cor: PMI nondisplaced. Regular rhythm, mildly tachy rate. No rubs, gallops or murmurs. Lungs: clear Abdomen: obese, soft, nontender, nondistended. No hepatosplenomegaly. No bruits or masses. Good bowel sounds. Extremities: no cyanosis, clubbing, rash, edema Neuro: alert & oriented x 3, cranial nerves grossly intact. moves all 4 extremities w/o difficulty. Affect pleasant.  Telemetry   Sinus tach low 100s   EKG    N/A   Labs    CBC Recent Labs    02/17/23 0601 02/19/23 0605  WBC 12.8* 8.7  HGB 11.6* 11.5*  HCT 37.5 36.1  MCV 85.2 83.2  PLT 207 189   Basic Metabolic Panel Recent Labs    10/93/23 0945 02/17/23 0601 02/18/23 0951 02/19/23 0605  NA 140 133* 135 139  K 3.3* 3.8 3.3* 4.0   CL 102 102 103 105  CO2 25 26 25 26   GLUCOSE 181* 116* 103* 93  BUN 20 11 13 12   CREATININE 1.17* 0.85 0.85 0.85  CALCIUM 9.6 9.5 9.8 9.6  MG 2.3 2.2  --   --   PHOS 3.0 2.6  --   --    Liver Function Tests Recent Labs    02/16/23 0945 02/17/23 0601  AST 21 17  ALT 24 21  ALKPHOS 66 65  BILITOT 0.6 1.0  PROT 7.7 7.0  ALBUMIN 3.3* 3.1*   No results for input(s): "LIPASE", "AMYLASE" in the last 72 hours. Cardiac Enzymes No results for input(s): "CKTOTAL", "CKMB", "CKMBINDEX", "TROPONINI" in the last 72 hours.  BNP: BNP (last 3 results) Recent Labs    02/07/23 0947 02/15/23 2020  BNP 1,470.5* 1,305.5*    ProBNP (last 3 results) No results for input(s): "PROBNP" in the last 8760 hours.   D-Dimer No results for input(s): "DDIMER" in the last 72 hours. Hemoglobin A1C No results for input(s): "HGBA1C" in the last 72 hours.  Fasting Lipid Panel Recent Labs    02/16/23 0945  CHOL 155  HDL 45  LDLCALC 98  TRIG 62  CHOLHDL 3.4   Thyroid Function Tests No results for input(s): "TSH", "T4TOTAL", "T3FREE", "THYROIDAB" in the last 72 hours.  Invalid input(s): "FREET3"  Other results:   Imaging    No results found.  Medications:     Scheduled Medications:  apixaban  5 mg Oral BID   Chlorhexidine Gluconate Cloth  6 each Topical Daily   dapagliflozin propanediol  10 mg Oral Daily   digoxin  0.125 mg Oral Daily   docusate sodium  100 mg Oral BID   insulin aspart  0-15 Units Subcutaneous TID WC   insulin aspart  0-5 Units Subcutaneous QHS   polyethylene glycol  17 g Oral Daily   rosuvastatin  20 mg Oral Daily   spironolactone  12.5 mg Oral Daily    Infusions:  sodium chloride Stopped (02/16/23 0138)    PRN Medications: sodium chloride, acetaminophen **OR** acetaminophen (TYLENOL) oral liquid 160 mg/5 mL **OR** acetaminophen, mouth rinse, senna-docusate    Patient Profile   Stephanie Reilly is a 52 y.o. female with hypertension, hyperlipidemia,  type 2 diabetes, obesity and recently diagnosed systolic heart failure currently admitted with stroke. On arrival code stroke was called with imaging demonstrating occlusion of the proximal right M2. She was sent to interventional radiology and intubated. Immediately postintubation she had PEA arrest requiring 2 minutes of CPR and epinephrine prior to ROSC. Since that time she has undergone diagnostic cerebral angiogram and thrombectomy for the right M2 occlusion now status post complete recanalization.   Assessment/Plan   PEA arrest w/ recently diagnosed systolic heart failure - PEA arrest likely after induction for intubation. Now extubated. Sats stable.   - Newly diagnosed HFrEF earlier this month. ECHO preserved RV function, LVEF 15%. LHC /RHC with normal cors. MRI- LV EF 12% RV moderate dysfunction. Subendocardial LGE - ? Small infarct possible sarcoid. Will need eventual PET scan.  - BP stable off NE. Now mild-moderately hypertensive  - Volume status stable on exam - Continue Entresto to 24-26 mg bid  - Continue Spiro 25 mg daily  - Continue Farxiga 10 mg daily  - Continue digoxin 0.125 mcg  - Start Coreg 3.125 mg bid  - d/c home w/ LifeVest  - will need PRN loop diuretic at discharge, torsemide 20 mg daily PRN     2. Right M2 stroke - s/p mechanical thrombectomy with small SAH - suspect cardio embolic  - Neuro started on eliquis.  - On statin.  - No residual deficits.    3. Acute Renal failure - Resolved.   4. DMII - Hgb A1C 6.1  - On SSI  - on Farxiga  - Body mass index is 44.92 kg/m., Consider future GLP1   5. H/O NSVT /PVCs  - During recent hospitalization had > 40 beats NSVT, asymptomatic.  Dr Elberta Fortis EP saw plan for GDMT  - had another 4 beat run overnight  - LifeVest ordered and has been delivered.  - Not enough PVCs currently to warrant amiodarone.   We will arrange post hospital f/u in the [facility].   Length of Stay: [address], [place]  02/19/2023, 9:36  AM  Advanced Heart Failure Team Pager [phone] (M-F; 7a - 5p)  Please contact [facility] for night-coverage after hours (5p -7a ) and weekends on [email or web address]  Patient seen with PA, agree with the above note.   She is stable clinically today, no complaints.   General: NAD Neck: No JVD, no thyromegaly or thyroid nodule.  Lungs: Clear to auscultation bilaterally with normal respiratory effort. CV: Nondisplaced PMI.  Heart regular S1/S2, no S3/S4, no murmur.  No peripheral edema.   Abdomen: Soft, nontender, no hepatosplenomegaly, no distention.  Skin: Intact without lesions or rashes.  Neurologic: Alert  and oriented x 3.  Psych: Normal affect. Extremities: No clubbing or cyanosis.  HEENT: Normal.   Add Coreg today as above.  No other med changes.  Home with Lifevest, we will arrange followup.   Marca Ancona 02/20/2023 3:49 PM

## 2023-02-20 NOTE — Significant Event (Signed)
The patient had 4 beats of ventricular tachycardia in the early AM during overnight shift. She was asymptomatic. Will need Cardiology to see again in the morning to determine if any changes to her cardiac management are needed.   Electronically signed: Dr. Caryl Pina

## 2023-02-21 ENCOUNTER — Encounter: Payer: Self-pay | Admitting: Internal Medicine

## 2023-02-21 ENCOUNTER — Ambulatory Visit: Payer: Medicaid Other | Attending: Family Medicine | Admitting: Internal Medicine

## 2023-02-21 VITALS — BP 130/75 | HR 108 | Temp 98.3°F | Ht 62.0 in | Wt 248.0 lb

## 2023-02-21 DIAGNOSIS — I5042 Chronic combined systolic (congestive) and diastolic (congestive) heart failure: Secondary | ICD-10-CM | POA: Diagnosis not present

## 2023-02-21 DIAGNOSIS — Z7984 Long term (current) use of oral hypoglycemic drugs: Secondary | ICD-10-CM

## 2023-02-21 DIAGNOSIS — I10 Essential (primary) hypertension: Secondary | ICD-10-CM | POA: Diagnosis not present

## 2023-02-21 DIAGNOSIS — Z6841 Body Mass Index (BMI) 40.0 and over, adult: Secondary | ICD-10-CM | POA: Diagnosis not present

## 2023-02-21 DIAGNOSIS — R7303 Prediabetes: Secondary | ICD-10-CM

## 2023-02-21 DIAGNOSIS — Z8673 Personal history of transient ischemic attack (TIA), and cerebral infarction without residual deficits: Secondary | ICD-10-CM | POA: Diagnosis not present

## 2023-02-21 DIAGNOSIS — Z7689 Persons encountering health services in other specified circumstances: Secondary | ICD-10-CM | POA: Diagnosis not present

## 2023-02-21 DIAGNOSIS — E782 Mixed hyperlipidemia: Secondary | ICD-10-CM

## 2023-02-21 MED ORDER — SACUBITRIL-VALSARTAN 24-26 MG PO TABS: 1.0000 | ORAL_TABLET | Freq: Two times a day (BID) | ORAL | 1 refills | Status: DC

## 2023-02-21 MED ORDER — CARVEDILOL 3.125 MG PO TABS: 3.1250 mg | ORAL_TABLET | Freq: Two times a day (BID) | ORAL | 1 refills | Status: DC

## 2023-02-21 MED ORDER — SPIRONOLACTONE 25 MG PO TABS: 25.0000 mg | ORAL_TABLET | Freq: Every day | ORAL | 1 refills | Status: DC

## 2023-02-21 MED ORDER — APIXABAN 5 MG PO TABS: 5.0000 mg | ORAL_TABLET | Freq: Two times a day (BID) | ORAL | 2 refills | Status: DC

## 2023-02-21 MED ORDER — DIGOXIN 125 MCG PO TABS: 0.1250 mg | ORAL_TABLET | Freq: Every day | ORAL | 1 refills | Status: DC

## 2023-02-21 MED ORDER — DAPAGLIFLOZIN PROPANEDIOL 10 MG PO TABS: 10.0000 mg | ORAL_TABLET | Freq: Every day | ORAL | 1 refills | Status: DC

## 2023-02-21 MED ORDER — ROSUVASTATIN CALCIUM 20 MG PO TABS: 20.0000 mg | ORAL_TABLET | Freq: Every day | ORAL | 1 refills | Status: DC

## 2023-02-21 NOTE — Progress Notes (Signed)
Patient ID: Stephanie Reilly, female    DOB: 03-May-1971  MRN: [id number]  CC: Hospitalization Follow-up (Hospitalization f/u. Franchot Erichsen medications - Pt instructed to stop taking Nsaids & tylenol./Requesting FMLA paperwork for daughter.)   Subjective: Stephanie Reilly is a 52 y.o. female who presents for new pt visit and hosp f/u.  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  Decided to come her since all of her other doctors are here at [facility].  Patient hospitalized 5/11-16/2024 with slurred speech and left facial droop.  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  Had mechanical thrombectomy done by IR.  However during anesthesia induction, she coded with ROSC after 2 minutes.  Patient discharged on guideline directed therapy for CHF/cardiomyopathy along with Eliquis and a LifeVest. 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 ASPECTS 10.    CTA head & neck occlusion of proximal right M2 Post IR with TICI3 reperfusion CT contrast vs. SAH in right sylvian fissure MRI small acute infarcts in the right frontal, parietal, bilateral occipital lobes and right cerebellum MRA right M2 patent now 2D Echo EF < 20% LDL 98 HgbA1c 6.1 VTE prophylaxis -Lovenox aspirin 81 mg daily prior to admission, now on Eliquis.  Therapy recommendations: None  Today: Embolic CVA: Patient discharged from hospital yesterday.  She has all of her medications.  Denies any bruising or bleeding with Eliquis.  She has called neurology office to schedule an appointment. -Speech is back to normal.  Facial droop has  resolved.  She has a standard walker with front wheels with her today.  States that it was given to her as a precautionary thing so that she does not overexert herself especially with her wearing a LifeVest. -Daughter is requesting that I complete an FMLA form for her.  She was out of work from 5/11-16/2024 while her mother was in hospital.  She will return to work 02/24/2023.  She would like intermittent leave at least 3-4 times a month over the next several months to assist with her mother's care and to bring her to her doctors appointments.  CHF/cardiomyopathy/HTN: Has follow-up appointment scheduled with cardiology next week and on June 7.  She limits salt in the foods.  No lower extremity edema, shortness of breath or chest pain at this time.  Prediabetes/obesity: States that her daughter has been diligent in helping her to try to eat healthier.  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) 02/15/2023   Shock circulatory (HCC) 02/15/2023   Iron deficiency anemia 02/09/2023   Heart failure (HCC) 02/08/2023   Hypokalemia 02/08/2023   Acute on chronic combined systolic and diastolic CHF (congestive heart failure) (HCC) 02/07/2023   Essential hypertension 02/07/2023   Diabetes mellitus type 2, diet-controlled (HCC) 02/07/2023   Elevated troponin 02/07/2023   Obesity, Class III, BMI 40-49.9 (morbid obesity) (HCC) 02/07/2023     Current Outpatient Medications on File Prior to Visit  Medication Sig Dispense Refill   apixaban (ELIQUIS) 5 MG TABS tablet Take 1 tablet (5  mg total) by mouth 2 (two) times daily. 60 tablet 0   Ascorbic Acid (VITAMIN C) 100 MG tablet Take 100 mg by mouth daily.     Biotin (BIOTIN MAXIMUM STRENGTH) 10 MG TABS Take 10 mg by mouth daily.     carvedilol (COREG) 3.125 MG tablet Take 1 tablet (3.125 mg total) by mouth 2 (two) times daily with a meal. 60 tablet 0   cetirizine (ZYRTEC) 10 MG tablet Take 10  mg by mouth daily.     dapagliflozin propanediol (FARXIGA) 10 MG TABS tablet Take 1 tablet (10 mg total) by mouth daily. 30 tablet 0   digoxin (LANOXIN) 0.125 MG tablet Take 1 tablet (0.125 mg total) by mouth daily. 30 tablet 0   Multiple Vitamins-Minerals (HAIR SKIN & NAILS PO) Take 2 tablets by mouth daily.     polyethylene glycol (MIRALAX / GLYCOLAX) 17 g packet Take 17 g by mouth daily. 14 each 0   rosuvastatin (CRESTOR) 20 MG tablet Take 1 tablet (20 mg total) by mouth daily. 30 tablet 0   sacubitril-valsartan (ENTRESTO) 24-26 MG Take 1 tablet by mouth 2 (two) times daily. 60 tablet 0   senna-docusate (SENOKOT-S) 8.6-50 MG tablet Take 1 tablet by mouth at bedtime as needed for mild constipation. 30 tablet 0   spironolactone (ALDACTONE) 25 MG tablet Take 1 tablet (25 mg total) by mouth daily. 30 tablet 0   torsemide (DEMADEX) 20 MG tablet Take 1 tablet (20 mg total) by mouth daily as needed (for increased edema). 30 tablet 0   No current facility-administered medications on file prior to visit.    Allergies  Allergen Reactions   Paroxetine Hcl Anaphylaxis   Prednisone Shortness Of Breath and Other (See Comments)    Confusion, "messes with mental," muscles feel weird, fatigue, insomnia   Chocolate Other (See Comments)    Unknown    Ibuprofen Other (See Comments)    Unknown    Iron Other (See Comments)    GI upset (reported 08/04/2017). Pt clarifies had constipation, nausea.  Stool softeners did not help.    Latex Other (See Comments)    Unknown    Lisinopril Hives   Naproxen Other (See Comments)    Unknown    Tramadol Hives   Tylenol [Acetaminophen] Other (See Comments)    With Codeine; GI    Penicillins Rash   Serotonin Nausea Only, Swelling, Anxiety, Rash and Other (See Comments)    GI intolerence    Social History   Socioeconomic History   Marital status: Single    Spouse name: Not on file   Number of children: 3   Years of education: Not on file   Highest education  level: Bachelor's degree (e.g., BA, AB, BS)  Occupational History   Occupation: self employed  Tobacco Use   Smoking status: Former    Packs/day: 0.50    Years: 11.00    Additional pack years: 0.00    Total pack years: 5.50    Types: Cigarettes    Quit date: 10/2021    Years since quitting: 1.3    Passive exposure: Past   Smokeless tobacco: Never  Vaping Use   Vaping Use: Never used  Substance and Sexual Activity   Alcohol use: Never   Drug use: Yes    Types: Marijuana    Comment: routine use of CBD edibles   Sexual activity: Not on file  Other Topics Concern   Not on file  Social History Narrative   Not on  file   Social Determinants of Health   Financial Resource Strain: Low Risk  (02/20/2023)   Overall Financial Resource Strain (CARDIA)    Difficulty of Paying Living Expenses: Not hard at all  Food Insecurity: No Food Insecurity (02/20/2023)   Hunger Vital Sign    Worried About Running Out of Food in the Last Year: Never true    Ran Out of Food in the Last Year: Never true  Transportation Needs: No Transportation Needs (02/20/2023)   PRAPARE - Administrator, Civil Service (Medical): No    Lack of Transportation (Non-Medical): No  Physical Activity: Insufficiently Active (02/20/2023)   Exercise Vital Sign    Days of Exercise per Week: 4 days    Minutes of Exercise per Session: 20 min  Stress: No Stress Concern Present (02/20/2023)   [employer] of Occupational Health - Occupational Stress Questionnaire    Feeling of Stress : Only a little  Social Connections: Moderately Isolated (02/20/2023)   Social Connection and Isolation Panel [NHANES]    Frequency of Communication with Friends and Family: More than three times a week    Frequency of Social Gatherings with Friends and Family: Once a week    Attends Religious Services: More than 4 times per year    Active Member of [employer] or Organizations: No    Attends Banker Meetings: Not on file     Marital Status: Never married  Intimate Partner Violence: Not At Risk (02/15/2023)   Humiliation, Afraid, Rape, and Kick questionnaire    Fear of Current or Ex-Partner: No    Emotionally Abused: No    Physically Abused: No    Sexually Abused: No    Family History  Problem Relation Age of Onset   Hypertension Mother    Heart failure Mother    COPD Mother    Diabetes Paternal Grandfather     Past Surgical History:  Procedure Laterality Date   ABDOMINAL HYSTERECTOMY     IR CT HEAD LTD  02/15/2023   IR PERCUTANEOUS ART THROMBECTOMY/INFUSION INTRACRANIAL INC DIAG ANGIO  02/15/2023   IR US GUIDE VASC ACCESS RIGHT  02/15/2023   RADIOLOGY WITH ANESTHESIA N/A 02/15/2023   Procedure: IR WITH ANESTHESIA;  Surgeon: Radiologist, Medication, MD;  Location: [facility] OR;  Service: Radiology;  Laterality: N/A;   RIGHT/LEFT HEART CATH AND CORONARY ANGIOGRAPHY N/A 02/10/2023   Procedure: RIGHT/LEFT HEART CATH AND CORONARY ANGIOGRAPHY;  Surgeon: [place], Peter M, MD;  Location: [facility] INVASIVE CV LAB;  Service: Cardiovascular;  Laterality: N/A;   TONSILLECTOMY     TUBAL LIGATION      ROS: Review of Systems Negative except as stated above  PHYSICAL EXAM: BP 130/75   Pulse (!) 108   Temp 98.3 F (36.8 C) (Oral)   Ht 5\' 2"  (1.575 m)   Wt 248 lb (112.5 kg)   SpO2 97%   BMI 45.36 kg/m   Physical Exam  General appearance - alert, well appearing, middle-aged African-American female and in no distress Mental status - normal mood, behavior, speech, dress, motor activity, and thought processes Neck - supple, no significant adenopathy Chest - clear to auscultation, no wheezes, rales or rhonchi, symmetric air entry Heart - normal rate, regular rhythm, normal S1, S2, no murmurs, rubs, clicks or gallops Extremities -no lower extremity edema.      Latest Ref Rng & Units 02/20/2023    4:45 AM 02/19/2023    6:05 AM 02/18/2023    9:51 AM  CMP  Glucose  70 - 99 mg/dL 161  93  096   BUN 6 - 20 mg/dL 10  12  13     Creatinine 0.44 - 1.00 mg/dL 0.45  4.09  8.11   Sodium 135 - 145 mmol/L 137  139  135   Potassium 3.5 - 5.1 mmol/L 3.5  4.0  3.3   Chloride 98 - 111 mmol/L 104  105  103   CO2 22 - 32 mmol/L 24  26  25    Calcium 8.9 - 10.3 mg/dL 9.4  9.6  9.8    Lipid Panel     Component Value Date/Time   CHOL 155 02/16/2023 0945   TRIG 62 02/16/2023 0945   HDL 45 02/16/2023 0945   CHOLHDL 3.4 02/16/2023 0945   VLDL 12 02/16/2023 0945   LDLCALC 98 02/16/2023 0945    CBC    Component Value Date/Time   WBC 8.7 02/19/2023 0605   RBC 4.34 02/19/2023 0605   HGB 11.5 (L) 02/19/2023 0605   HCT 36.1 02/19/2023 0605   PLT 189 02/19/2023 0605   MCV 83.2 02/19/2023 0605   MCH 26.5 02/19/2023 0605   MCHC 31.9 02/19/2023 0605   RDW 15.4 02/19/2023 0605   LYMPHSABS 3.3 02/15/2023 1245   MONOABS 0.9 02/15/2023 1245   EOSABS 0.1 02/15/2023 1245   BASOSABS 0.1 02/15/2023 1245    ASSESSMENT AND PLAN: 1. Establishing care with new doctor, encounter for   2. History of CVA (cerebrovascular accident) without residual deficits Multi embolic thought to be due to low cardiac output.  Currently on Eliquis and Crestor.  3. Chronic combined systolic and diastolic CHF, NYHA class 2 (HCC) Patient on guideline directed therapy including Entresto 24/26 mg twice a day, Farxiga 10 mg daily, spironolactone 25 mg daily, carvedilol 3.125 mg twice a day, digoxin 0.125 mg daily and torsemide 20 mg as needed. Keep upcoming appointments with cardiology.  4. Essential hypertension Blood pressure today at goal.  She will continue blood pressure medications listed above in #3.  5. Morbid obesity (HCC) 6. Prediabetes Patient advised to eliminate sugary drinks from the diet, cut back on portion sizes especially of white carbohydrates, eat more white lean meat like chicken [place] and seafood instead of beef or pork and incorporate fresh fruits and vegetables into the diet daily.   7. Mixed hyperlipidemia Continue  Crestor.    Patient was given the opportunity to ask questions.  Patient verbalized understanding of the plan and was able to repeat key elements of the plan.   This documentation was completed using Paediatric nurse.  Any transcriptional errors are unintentional.  No orders of the defined types were placed in this encounter.    Requested Prescriptions    No prescriptions requested or ordered in this encounter    Return in about 3 months (around 05/24/2023) for Sign release to get med records from previous PCP.  Jonah Blue, MD, FACP

## 2023-02-27 NOTE — Progress Notes (Signed)
ADVANCED HF CLINIC CONSULT NOTE  Referring Physician: Marcine Matar, MD Primary Care: Marcine Matar, MD Primary Cardiologist: Rollene Rotunda, MD HF Cardiologist: Dr. Gasper Lloyd  HPI: Stephanie Reilly is a 52 y.o. female with hypertension, hyperlipidemia, type 2 diabetes, obesity and recently diagnosed systolic heart failure.   Her cardiac history dates back to 02/07/2023 when she presented to the ED with chest pain and SOB that started 3/24.  During that admission patient had an echo with EF of 20% with GHK but preserved RV function.  LHC was nonsignificant and RHC with  preserved cardiac index.  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  She was discharged home on Entresto, Farxiga, and spironolactone and Toprol.  Recently admitted after presenting with acute slurred speech and facial droop on Feb 15, 2023.  Code stroke was called with imaging demonstrating occlusion of the proximal right M2.  Sent to IR and intubated.  Immediately postintubation she had PEA arrest.  Underwent diagnostic cerebral angiogram and thrombectomy for the right M2 occlusion now status post complete recanalization. Discharged 5/16.  Today she returns for post hospital follow up. Overall feeling ok just dealing with anxiety. Tearful about mom having to help her a lot around the house, requesting aid. Mom cooks for her and helps her get into the shower. Able to walk around the house with her walker, takes breaks to catch her breath. Denies palpitations, CP, dizziness, edema, or PND/Orthopnea. No SOB. Appetite good. No fever or chills. Weight at home 246 pounds. Taking all medications. Denies ETOH and smoking. BP at home 130s-70s takes about 3x's day.   Lifevest interrogation: wear time 21.7 hrs/day, avg HR 95  EKG ST PVCs 107 bpm (Personally reviewed)     Past Medical History:  Diagnosis Date   Anemia    Anxiety    Arthritis    Depression    Hypertension    Prediabetes      Current Outpatient Medications  Medication Sig Dispense Refill   apixaban (ELIQUIS) 5 MG TABS tablet Take 1 tablet (5 mg total) by mouth 2 (two) times daily. 60 tablet 2   Ascorbic Acid (VITAMIN C) 100 MG tablet Take 100 mg by mouth daily.     Biotin (BIOTIN MAXIMUM STRENGTH) 10 MG TABS Take 10 mg by mouth daily.     carvedilol (COREG) 3.125 MG tablet Take 1 tablet (3.125 mg total) by mouth 2 (two) times daily with a meal. 180 tablet 1   cetirizine (ZYRTEC) 10 MG tablet Take 10 mg by mouth daily.     dapagliflozin propanediol (FARXIGA) 10 MG TABS tablet Take 1 tablet (10 mg total) by mouth daily. 90 tablet 1   digoxin (LANOXIN) 0.125 MG tablet Take 1 tablet (0.125 mg total) by mouth daily. 90 tablet 1   Multiple Vitamins-Minerals (HAIR SKIN & NAILS PO) Take 2 tablets by mouth daily.     polyethylene glycol (MIRALAX / GLYCOLAX) 17 g packet Take 17 g by mouth daily. (Patient taking differently: Take 17 g by mouth daily as needed.) 14 each 0   rosuvastatin (CRESTOR) 20 MG tablet Take 1 tablet (20 mg total) by mouth daily. 90 tablet 1   sacubitril-valsartan (ENTRESTO) 24-26 MG Take 1 tablet by mouth 2 (two) times daily. 180 tablet 1   senna-docusate (SENOKOT-S) 8.6-50 MG tablet Take 1 tablet by mouth at bedtime as needed for mild constipation. 30 tablet 0   spironolactone (ALDACTONE) 25 MG tablet Take 1 tablet (25 mg total)  by mouth daily. 90 tablet 1   torsemide (DEMADEX) 20 MG tablet Take 1 tablet (20 mg total) by mouth daily as needed (for increased edema). 30 tablet 0   No current facility-administered medications for this encounter.    Allergies  Allergen Reactions   Paroxetine Hcl Anaphylaxis   Prednisone Shortness Of Breath and Other (See Comments)    Confusion, "messes with mental," muscles feel weird, fatigue, insomnia   Chocolate Other (See Comments)    Unknown    Ibuprofen Other (See Comments)    Unknown    Iron Other (See Comments)    GI upset (reported 08/04/2017). Pt  clarifies had constipation, nausea.  Stool softeners did not help.    Latex Other (See Comments)    Unknown    Lisinopril Hives   Naproxen Other (See Comments)    Unknown    Tramadol Hives   Tylenol [Acetaminophen] Other (See Comments)    With Codeine; GI    Penicillins Rash   Serotonin Nausea Only, Swelling, Anxiety, Rash and Other (See Comments)    GI intolerence   Social History   Socioeconomic History   Marital status: Single    Spouse name: Not on file   Number of children: 3   Years of education: Not on file   Highest education level: Bachelor's degree (e.g., BA, AB, BS)  Occupational History   Occupation: self employed  Tobacco Use   Smoking status: Former    Packs/day: 0.50    Years: 11.00    Additional pack years: 0.00    Total pack years: 5.50    Types: Cigarettes    Quit date: 10/2021    Years since quitting: 1.3    Passive exposure: Past   Smokeless tobacco: Never  Vaping Use   Vaping Use: Never used  Substance and Sexual Activity   Alcohol use: Never   Drug use: Yes    Types: Marijuana    Comment: routine use of CBD edibles   Sexual activity: Not on file  Other Topics Concern   Not on file  Social History Narrative   Not on file   Social Determinants of Health   Financial Resource Strain: Low Risk  (02/20/2023)   Overall Financial Resource Strain (CARDIA)    Difficulty of Paying Living Expenses: Not hard at all  Food Insecurity: No Food Insecurity (02/20/2023)   Hunger Vital Sign    Worried About Running Out of Food in the Last Year: Never true    Ran Out of Food in the Last Year: Never true  Transportation Needs: No Transportation Needs (02/20/2023)   PRAPARE - Administrator, Civil Service (Medical): No    Lack of Transportation (Non-Medical): No  Physical Activity: Insufficiently Active (02/20/2023)   Exercise Vital Sign    Days of Exercise per Week: 4 days    Minutes of Exercise per Session: 20 min  Stress: No Stress Concern  Present (02/20/2023)   [employer] of Occupational Health - Occupational Stress Questionnaire    Feeling of Stress : Only a little  Social Connections: Moderately Isolated (02/20/2023)   Social Connection and Isolation Panel [NHANES]    Frequency of Communication with Friends and Family: More than three times a week    Frequency of Social Gatherings with Friends and Family: Once a week    Attends Religious Services: More than 4 times per year    Active Member of [employer] or Organizations: No    Attends Banker Meetings:  Not on file    Marital Status: Never married  Intimate Partner Violence: Not At Risk (02/15/2023)   Humiliation, Afraid, Rape, and Kick questionnaire    Fear of Current or Ex-Partner: No    Emotionally Abused: No    Physically Abused: No    Sexually Abused: No   Family History  Problem Relation Age of Onset   Hypertension Mother    Heart failure Mother    COPD Mother    Diabetes Paternal Grandfather    Vitals:   02/28/23 0920  BP: 120/86  Pulse: (!) 110  SpO2: 97%  Weight: 112.4 kg (247 lb 12.8 oz)   PHYSICAL EXAM: General:  well appearing.  No respiratory difficulty. Walked in with walker HEENT: normal Neck: supple. JVD flat. Carotids 2+ bilat; no bruits. No lymphadenopathy or thyromegaly appreciated. Cor: PMI nondisplaced. Tachy-reg rate & rhythm. No rubs, gallops or murmurs. Lungs: clear Abdomen: obese, soft, nontender, nondistended. No hepatosplenomegaly. No bruits or masses. Good bowel sounds. Extremities: no cyanosis, clubbing, rash, edema  Neuro: alert & oriented x 3, cranial nerves grossly intact. moves all 4 extremities w/o difficulty. Affect pleasant.   Wt Readings from Last 3 Encounters:  02/28/23 112.4 kg (247 lb 12.8 oz)  02/21/23 112.5 kg (248 lb)  02/15/23 111.4 kg (245 lb 9.5 oz)    ASSESSMENT & PLAN: Chronic systolic heart failure, recent PEA arrest - PEA arrest likely after induction for intubation.  - Newly diagnosed  HFrEF 5/24. Echo 5/24 preserved RV function, LVEF 15%. [facility] 5/24 with normal cors. MRI 5/24 LV EF 12% RV moderate dysfunction. Subendocardial LGE - ? Small infarct possible sarcoid. Will order PET scan.  - NYHA II, Volume status stable on exam. Continue torsemide 20 mg PRN - Continue Entresto 24-26 mg bid  - Continue Spiro 25 mg daily  - Continue Farxiga 10 mg daily  - Continue digoxin 0.125 mcg, check level at next visit, already took dig this morning.  - Increase Coreg 3.125>6.25 mg bid  - Wearing LifeVest  - BMET today   2. Right M2 stroke - s/p mechanical thrombectomy with small SAH - suspect cardio embolic  - On eliquis + statin.  - No residual deficits.  - CBC today, denies bleeding   3. DMII - Hgb A1C 6.1  - on Farxiga  - Body mass index is 45.32 kg/m. ,  - Refer to PharmD for GLP1    4. H/O NSVT /PVCs  - During recent hospitalization had > 40 beats NSVT, asymptomatic.  Dr Elberta Fortis EP saw plan for GDMT  - Lifevest - Not enough PVCs currently to warrant amiodarone.   5. SDOH - HFSW to see for possible HH aid, tearful as her 65+ y.o. mom has to help her around the house.   Follow up 4-6 weeks with APP for further GDMT optimization and 2-3 months with Dr. Gasper Lloyd +echo  Brynda Peon, AGACNP-BC  02/28/23 9:45 AM  [facility] [address] [facility] [place] [address] [phone] (office) [phone] (fax)

## 2023-02-28 ENCOUNTER — Encounter (HOSPITAL_COMMUNITY): Payer: Medicaid Other

## 2023-02-28 ENCOUNTER — Ambulatory Visit (HOSPITAL_COMMUNITY)
Admit: 2023-02-28 | Discharge: 2023-02-28 | Disposition: A | Discharge status: Home / Self Care | Payer: Medicaid Other | Attending: Family Medicine | Admitting: Family Medicine

## 2023-02-28 ENCOUNTER — Encounter (HOSPITAL_COMMUNITY): Payer: Self-pay

## 2023-02-28 VITALS — BP 120/86 | HR 110 | Wt 247.8 lb

## 2023-02-28 DIAGNOSIS — Z7901 Long term (current) use of anticoagulants: Secondary | ICD-10-CM | POA: Diagnosis not present

## 2023-02-28 DIAGNOSIS — I639 Cerebral infarction, unspecified: Secondary | ICD-10-CM | POA: Diagnosis not present

## 2023-02-28 DIAGNOSIS — I493 Ventricular premature depolarization: Secondary | ICD-10-CM | POA: Diagnosis not present

## 2023-02-28 DIAGNOSIS — Z6841 Body Mass Index (BMI) 40.0 and over, adult: Secondary | ICD-10-CM | POA: Diagnosis not present

## 2023-02-28 DIAGNOSIS — E119 Type 2 diabetes mellitus without complications: Secondary | ICD-10-CM | POA: Diagnosis not present

## 2023-02-28 DIAGNOSIS — I469 Cardiac arrest, cause unspecified: Secondary | ICD-10-CM | POA: Diagnosis not present

## 2023-02-28 DIAGNOSIS — I5022 Chronic systolic (congestive) heart failure: Secondary | ICD-10-CM | POA: Diagnosis not present

## 2023-02-28 DIAGNOSIS — Z8679 Personal history of other diseases of the circulatory system: Secondary | ICD-10-CM | POA: Diagnosis not present

## 2023-02-28 DIAGNOSIS — Z79899 Other long term (current) drug therapy: Secondary | ICD-10-CM | POA: Insufficient documentation

## 2023-02-28 DIAGNOSIS — Z139 Encounter for screening, unspecified: Secondary | ICD-10-CM | POA: Diagnosis not present

## 2023-02-28 DIAGNOSIS — I4729 Other ventricular tachycardia: Secondary | ICD-10-CM | POA: Diagnosis not present

## 2023-02-28 DIAGNOSIS — I63411 Cerebral infarction due to embolism of right middle cerebral artery: Secondary | ICD-10-CM

## 2023-02-28 DIAGNOSIS — I110 Hypertensive heart disease with heart failure: Secondary | ICD-10-CM | POA: Insufficient documentation

## 2023-02-28 DIAGNOSIS — E785 Hyperlipidemia, unspecified: Secondary | ICD-10-CM | POA: Insufficient documentation

## 2023-02-28 DIAGNOSIS — E669 Obesity, unspecified: Secondary | ICD-10-CM | POA: Insufficient documentation

## 2023-02-28 DIAGNOSIS — Z7689 Persons encountering health services in other specified circumstances: Secondary | ICD-10-CM | POA: Diagnosis not present

## 2023-02-28 LAB — BASIC METABOLIC PANEL
Anion gap: 9 (ref 5–15)
BUN: 16 mg/dL (ref 6–20)
CO2: 22 mmol/L (ref 22–32)
Calcium: 10.3 mg/dL (ref 8.9–10.3)
Chloride: 106 mmol/L (ref 98–111)
Creatinine, Ser: 0.84 mg/dL (ref 0.44–1.00)
GFR, Estimated: >60 mL/min (ref >60)
Glucose, Bld: 101 mg/dL — ABNORMAL HIGH (ref 70–99)
Potassium: 4.5 mmol/L (ref 3.5–5.1)
Sodium: 137 mmol/L (ref 135–145)

## 2023-02-28 LAB — CBC
HCT: 43.5 % (ref 36.0–46.0)
Hemoglobin: 13.7 g/dL (ref 12.0–15.0)
MCH: 26.7 pg (ref 26.0–34.0)
MCHC: 31.5 g/dL (ref 30.0–36.0)
MCV: 84.6 fL (ref 80.0–100.0)
Platelets: 333 10*3/uL (ref 150–400)
RBC: 5.14 MIL/uL — ABNORMAL HIGH (ref 3.87–5.11)
RDW: 15.3 % (ref 11.5–15.5)
WBC: 10.4 10*3/uL (ref 4.0–10.5)
nRBC: 0 % (ref 0.0–0.2)

## 2023-02-28 MED ORDER — TORSEMIDE 20 MG PO TABS: 20.0000 mg | ORAL_TABLET | Freq: Every day | ORAL | 5 refills | Status: DC | PRN

## 2023-02-28 MED ORDER — CARVEDILOL 6.25 MG PO TABS: 6.2500 mg | ORAL_TABLET | Freq: Two times a day (BID) | ORAL | 11 refills | Status: DC

## 2023-02-28 NOTE — Patient Instructions (Addendum)
EKG done today.  Labs done today. We will contact you only if your labs are abnormal.  INCREASE Carvedilol to 6.25mg  (1 tablet) by mouth 2 times daily.  No other medication changes were made. Please continue all current medications as prescribed.  Your provider has requested that you have a Cardiac PET Scan done. This has to be approved through your insurance company prior to scheduling, once approved we will contact you to schedule an appointment.   You have been referred to the Pharmacy Clinic. They will contact you to schedule an appointment.   Your physician recommends that you schedule a follow-up appointment in: 4-6 weeks with our NP/PA Clinic and in 2-3 months with Dr. Gasper Lloyd with an echo prior to your appointment. Both appointments here in our office.   Your physician has requested that you have an echocardiogram. Echocardiography is a painless test that uses sound waves to create images of your heart. It provides your doctor with information about the size and shape of your heart and how well your heart's chambers and valves are working. This procedure takes approximately one hour. There are no restrictions for this procedure. Please do NOT wear cologne, perfume, aftershave, or lotions (deodorant is allowed). Please arrive 15 minutes prior to your appointment time.   If you have any questions or concerns before your next appointment please send [place] a message through [place] or call our office at [phone].    TO LEAVE A MESSAGE FOR THE NURSE SELECT OPTION 2, PLEASE LEAVE A MESSAGE INCLUDING: YOUR NAME DATE OF BIRTH CALL BACK NUMBER REASON FOR CALL**this is important as we prioritize the call backs  YOU WILL RECEIVE A CALL BACK THE SAME DAY AS LONG AS YOU CALL BEFORE 4:00 PM   Do the following things EVERYDAY: Weigh yourself in the morning before breakfast. Write it down and keep it in a log. Take your medicines as prescribed Eat low salt foods--Limit salt (sodium) to  2000 mg per day.  Stay as active as you can everyday Limit all fluids for the day to less than 2 liters   At the [facility], you and your health needs are our priority. As part of our continuing mission to provide you with exceptional heart care, we have created designated Provider Care Teams. These Care Teams include your primary Cardiologist (physician) and Advanced Practice Providers (APPs- Physician Assistants and Nurse Practitioners) who all work together to provide you with the care you need, when you need it.   You may see any of the following providers on your designated Care Team at your next follow up: Dr Arvilla Meres Dr Marca Ancona Dr. Marcos Eke, NP Robbie Lis, [place] [facility] [place], [place] Brynda Peon, NP Karle Plumber, PharmD   Please be sure to bring in all your medications bottles to every appointment.    Thank you for choosing [facility]

## 2023-02-28 NOTE — Progress Notes (Signed)
H&V Care Navigation CSW Progress Note  Clinical Social Worker consulted to assist pt in obtaining PCS services.  Pt reports new deficits following recent stroke and now requiring assistive devices at home and lower endurance and safety with  mobility.  Pt mom lives with her and pt was supposed to be caring for her mom but now the mom is assisting with household chores and cooking more than she should because of the patients condition.  Pt has Medicaid so discussed referral to Medicaid for PCS services- pt in agreement.  CSW called [facility] Medicaid and placed a referral over the phone- they will reach out to the patient to do further assessment.  Pt had become tearful during conversation- struggling to adjust to lower mobility and feeling like she is a burden.  CSW provided active listening and offered to assist with connecting to mental health resources.  Pt reports she has seen a therapist 2x a month for a long time and can call them if she has any immediate concerns- does not need further support at this time.  No other CSW needs identified at this time.  Patient is participating in a Managed Medicaid Plan:  Yes  SDOH Screenings   Food Insecurity: No Food Insecurity (02/20/2023)  Housing: Low Risk  (02/20/2023)  Transportation Needs: No Transportation Needs (02/20/2023)  Utilities: Not At Risk (02/15/2023)  Alcohol Screen: Low Risk  (02/20/2023)  Depression (PHQ2-9): Medium Risk (02/21/2023)  Financial Resource Strain: Low Risk  (02/20/2023)  Physical Activity: Insufficiently Active (02/20/2023)  Social Connections: Moderately Isolated (02/20/2023)  Stress: No Stress Concern Present (02/20/2023)  Tobacco Use: Medium Risk (02/28/2023)   Burna Sis, LCSW Clinical Social Worker [facility] Desk#: [phone] Cell#: [phone]

## 2023-03-03 ENCOUNTER — Encounter: Payer: Self-pay | Admitting: Internal Medicine

## 2023-03-03 DIAGNOSIS — M170 Bilateral primary osteoarthritis of knee: Secondary | ICD-10-CM | POA: Insufficient documentation

## 2023-03-03 NOTE — Progress Notes (Signed)
Received records from [facility]. Only one note received dated 11/13/2022.  Dx OA BL knees, Anxiety.

## 2023-03-04 ENCOUNTER — Telehealth: Payer: Self-pay | Admitting: Internal Medicine

## 2023-03-04 NOTE — Telephone Encounter (Signed)
Copied from CRM [id number]. Topic: General - Other >> Mar 04, 2023  3:25 PM Epimenio Foot F wrote: Reason for CRM: Pt's daughter is calling in because she was told to come pick up FMLA paperwork for pt on Thursday. Pt's daughter says she couldn't make it and wants to know if the paperwork could be sent to the pt's MyChart. Please advise.

## 2023-03-05 ENCOUNTER — Ambulatory Visit: Payer: Medicaid Other | Admitting: Physical Therapy

## 2023-03-05 NOTE — Telephone Encounter (Signed)
Pt's daughter is calling in requesting the status of the FMLA paperwork being sent to MyChart. Pt's daughter says she needs this sent to pt's MyChart immediately. Please advise.

## 2023-03-05 NOTE — Telephone Encounter (Signed)
Pt's mother called back for an update on request, wants to know if she will receive these today?

## 2023-03-05 NOTE — Telephone Encounter (Signed)
Called & spoke to [name]. Verified name & DOB. Informed patient that forms have been misplaced and I am unable to locate them and apologized to patient for the inconvenience. Patient expressed verbal understanding and requested to email forms to me. Gave patient my email and will print to have Dr.Johnson complete.

## 2023-03-05 NOTE — Telephone Encounter (Signed)
Called & spoke to Stephanie Reilly. I was able to locate paperwork. Patient expressed understanding and was grateful. Patient is requesting if forms can be uploaded to Mychart.

## 2023-03-05 NOTE — Telephone Encounter (Signed)
Ms.Koegel's daughter called back. Spoke with the daughter and informed her that I was unable to find documentation. Informed that I will call her tomorrow morning after speaking with Dr.Johnson. Daughter expressed verbal understanding.

## 2023-03-06 NOTE — Telephone Encounter (Signed)
Called & spoke to the patient. Informed that we are unable to upload forms to Mychart. Patient expressed verbal understanding and requested for the original copy to be left at the front desk and a copy mailed to the daughters home. No further questions at this time.

## 2023-03-08 DIAGNOSIS — Z419 Encounter for procedure for purposes other than remedying health state, unspecified: Secondary | ICD-10-CM | POA: Diagnosis not present

## 2023-03-10 ENCOUNTER — Other Ambulatory Visit: Payer: Self-pay | Admitting: Internal Medicine

## 2023-03-10 NOTE — Telephone Encounter (Signed)
Medication Refill - Medication: dapagliflozin propanediol (FARXIGA) 10 MG TABS tablet sacubitril-valsartan (ENTRESTO) 24-26 MG apixaban (ELIQUIS) 5 MG TABS tablet   Has the patient contacted their pharmacy? No.  Preferred Pharmacy (with phone number or street name): [id number] - [place], [place] - [address]  Phone: [phone] Fax: [phone]  Has the patient been seen for an appointment in the last year OR does the patient have an upcoming appointment? Yes.    Agent: Please be advised that RX refills may take up to 3 business days. We ask that you follow-up with your pharmacy.

## 2023-03-11 NOTE — Telephone Encounter (Signed)
Call to patient- Rx were sent 02/21/23 for all medications requested- she will contact the pharmacy Requested Prescriptions  Pending Prescriptions Disp Refills   dapagliflozin propanediol (FARXIGA) 10 MG TABS tablet 90 tablet 1    Sig: Take 1 tablet (10 mg total) by mouth daily.     Endocrinology:  Diabetes - SGLT2 Inhibitors Passed - 03/10/2023  2:07 PM      Passed - Cr in normal range and within 360 days    Creatinine, Ser  Date Value Ref Range Status  02/28/2023 0.84 0.44 - 1.00 mg/dL Final         Passed - HBA1C is between 0 and 7.9 and within 180 days    Hgb A1c MFr Bld  Date Value Ref Range Status  02/15/2023 6.1 (H) 4.8 - 5.6 % Final    Comment:    (NOTE) Pre diabetes:          5.7%-6.4%  Diabetes:              >6.4%  Glycemic control for   <7.0% adults with diabetes          Passed - eGFR in normal range and within 360 days    GFR, Estimated  Date Value Ref Range Status  02/28/2023 >60 >60 mL/min Final    Comment:    (NOTE) Calculated using the CKD-EPI Creatinine Equation (2021)          Passed - Valid encounter within last 6 months    Recent Outpatient Visits           2 weeks ago Establishing care with new doctor, encounter for   [facility] Marcine Matar, MD       Future Appointments             In 3 days Rollene Rotunda, MD [facility]   In 1 month [place], Andree Coss, MD [facility]   In 2 months Micki Riley, MD [facility]   In 2 months Marcine Matar, MD [facility]             sacubitril-valsartan (ENTRESTO) 24-26 MG 180 tablet 1    Sig: Take 1 tablet by mouth 2 (two) times daily.     Off-Protocol Failed - 03/10/2023  2:07 PM      Failed - Medication not assigned to a protocol, review manually.      Passed - Valid encounter within last 12 months    Recent  Outpatient Visits           2 weeks ago Establishing care with new doctor, encounter for   [facility] Marcine Matar, MD       Future Appointments             In 3 days Rollene Rotunda, MD [facility] at [facility]   In 1 month [place], Andree Coss, MD [facility]   In 2 months Micki Riley, MD [facility]   In 2 months Marcine Matar, MD [facility]             apixaban (ELIQUIS) 5 MG TABS tablet 60 tablet 2    Sig: Take 1 tablet (5 mg total) by mouth 2 (two) times daily.  Hematology:  Anticoagulants - apixaban Passed - 03/10/2023  2:07 PM      Passed - PLT in normal range and within 360 days    Platelets  Date Value Ref Range Status  02/28/2023 333 150 - 400 K/uL Final         Passed - HGB in normal range and within 360 days    Hemoglobin  Date Value Ref Range Status  02/28/2023 13.7 12.0 - 15.0 g/dL Final   Total hemoglobin  Date Value Ref Range Status  02/18/2023 11.9 (L) 12.0 - 16.0 g/dL Final         Passed - HCT in normal range and within 360 days    HCT  Date Value Ref Range Status  02/28/2023 43.5 36.0 - 46.0 % Final         Passed - Cr in normal range and within 360 days    Creatinine, Ser  Date Value Ref Range Status  02/28/2023 0.84 0.44 - 1.00 mg/dL Final         Passed - AST in normal range and within 360 days    AST  Date Value Ref Range Status  02/17/2023 17 15 - 41 U/L Final         Passed - ALT in normal range and within 360 days    ALT  Date Value Ref Range Status  02/17/2023 21 0 - 44 U/L Final         Passed - Valid encounter within last 12 months    Recent Outpatient Visits           2 weeks ago Establishing care with new doctor, encounter for   [facility] Marcine Matar, MD       Future Appointments              In 3 days Rollene Rotunda, MD [facility]   In 1 month [place], Andree Coss, MD [facility]   In 2 months Pearlean Brownie, Jason Fila, MD [facility]   In 2 months Marcine Matar, MD [facility]

## 2023-03-12 ENCOUNTER — Other Ambulatory Visit: Payer: Self-pay

## 2023-03-12 ENCOUNTER — Telehealth: Payer: Self-pay

## 2023-03-12 ENCOUNTER — Telehealth: Payer: Self-pay | Admitting: Internal Medicine

## 2023-03-12 ENCOUNTER — Ambulatory Visit: Payer: Medicaid Other | Attending: Internal Medicine

## 2023-03-12 DIAGNOSIS — R6889 Other general symptoms and signs: Secondary | ICD-10-CM | POA: Diagnosis not present

## 2023-03-12 DIAGNOSIS — I69352 Hemiplegia and hemiparesis following cerebral infarction affecting left dominant side: Secondary | ICD-10-CM | POA: Diagnosis not present

## 2023-03-12 DIAGNOSIS — Z7689 Persons encountering health services in other specified circumstances: Secondary | ICD-10-CM | POA: Diagnosis not present

## 2023-03-12 NOTE — Telephone Encounter (Signed)
Copied from CRM [id number]. Topic: General - Other >> Mar 11, 2023  5:24 PM Santiya F wrote: Reason for CRM: Pt's daughter is calling in because she received FMLA paperwork filled out by Dr. Laural Benes but her job says question 7 part b needs to be updated to reflect the duration and frequency. For example, if pt has a flare up how long will the daughter need to be out of work and how often are the flare ups. Per pt's daughter the form doesn't need to be redone, that section just needs an update. Pt's daughter says it also needs to have Dr. Henriette Combs initials and the date on it. Please advise.

## 2023-03-12 NOTE — Therapy (Signed)
OUTPATIENT PHYSICAL THERAPY NEURO EVALUATION   Patient Name: Stephanie Reilly MRN: [id number] DOB:1970-12-04, 52 y.o., female Today's Date: 03/12/2023   PCP: Jonah Blue, MD REFERRING PROVIDER: Brynda Peon, NP  END OF SESSION:  PT End of Session - 03/12/23 1258     Visit Number 1    Date for PT Re-Evaluation 06/04/23    PT Start Time 1300    PT Stop Time 1345    PT Time Calculation (min) 45 min    Activity Tolerance Patient limited by fatigue    Behavior During Therapy [facility] for tasks assessed/performed             Past Medical History:  Diagnosis Date   Anemia    Anxiety    Arthritis    Depression    Hypertension    Prediabetes    Past Surgical History:  Procedure Laterality Date   ABDOMINAL HYSTERECTOMY     IR CT HEAD LTD  02/15/2023   IR PERCUTANEOUS ART THROMBECTOMY/INFUSION INTRACRANIAL INC DIAG ANGIO  02/15/2023   IR US GUIDE VASC ACCESS RIGHT  02/15/2023   RADIOLOGY WITH ANESTHESIA N/A 02/15/2023   Procedure: IR WITH ANESTHESIA;  Surgeon: Radiologist, Medication, MD;  Location: [facility] OR;  Service: Radiology;  Laterality: N/A;   RIGHT/LEFT HEART CATH AND CORONARY ANGIOGRAPHY N/A 02/10/2023   Procedure: RIGHT/LEFT HEART CATH AND CORONARY ANGIOGRAPHY;  Surgeon: [place], Peter M, MD;  Location: [facility] INVASIVE CV LAB;  Service: Cardiovascular;  Laterality: N/A;   TONSILLECTOMY     TUBAL LIGATION     Patient Active Problem List   Diagnosis Date Noted   Osteoarthritis of both knees 03/03/2023   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) 02/15/2023   Shock circulatory (HCC) 02/15/2023   Iron deficiency anemia 02/09/2023   Heart failure (HCC) 02/08/2023   Hypokalemia 02/08/2023   Acute on chronic combined systolic and diastolic CHF (congestive heart failure) (HCC) 02/07/2023   Essential hypertension 02/07/2023   Diabetes mellitus type 2, diet-controlled (HCC) 02/07/2023   Elevated troponin 02/07/2023   Obesity, Class III, BMI 40-49.9 (morbid obesity) (HCC)  02/07/2023    ONSET DATE: 02/15/23  REFERRING DIAG: CVA with L hemi  THERAPY DIAG:  Hemiplegia and hemiparesis following cerebral infarction affecting left dominant side (HCC)  Impaired tolerance of activity  Rationale for Evaluation and Treatment: Rehabilitation  SUBJECTIVE:                                                                                                                                                                                             SUBJECTIVE STATEMENT: I get fatigued quickly, am doing the pursed  lip breathing like I was taught  Pt accompanied by: self  PERTINENT HISTORY: h/o CHF hospitalized 5/3 to 02/12/23, returned home and was  Rehospitalized with R thrombotic Middle Cerebral artery, CVA. Hospitalized for 5 days after and sent home.  PAIN:  Are you having pain? Yes: NPRS scale: 4/10 Pain location: R knee  Pain description: throbbing constantly Aggravating factors: weather Relieving factors: heat, rest  PRECAUTIONS: ICD/Pacemaker    WEIGHT BEARING RESTRICTIONS: No  FALLS: Has patient fallen in last 6 months? No  LIVING ENVIRONMENT: Lives with: lives with an adult companion Lives in: House/apartment Stairs: Yes: External: 1 steps; none Has following equipment at home: Walker - 2 wheeled  PLOF: Independent  PATIENT GOALS: I want to be able to walk and stand longer without having to sit down, I want to be able to get out of shower without relying on my 50 y.o mother to help  OBJECTIVE:   DIAGNOSTIC FINDINGS: na  COGNITION: Overall cognitive status: Within functional limits for tasks assessed   SENSATION: WFL  COORDINATION: Toe tapping, marching wfl B  EDEMA: none noted today   MUSCLE TONE: wnl   POSTURE: forward head and weight shift left  LOWER EXTREMITY ROM:   all wfl, UE Rom wfl    LOWER EXTREMITY MMT:    MMT Right Eval Left Eval  Hip flexion    Hip extension    Hip abduction    Hip adduction    Hip internal  rotation    Hip external rotation    Knee flexion    Knee extension 4+ 4+  Ankle dorsiflexion    Ankle plantarflexion    Ankle inversion    Ankle eversion    (Blank rows = not tested)  GAIT: Gait pattern: wide BOS Distance walked: 240' Assistive device utilized: Environmental consultant - 2 wheeled Level of assistance: Modified independence Comments: used walker to arrive to appt, functional tests performed without device  FUNCTIONAL TESTS:  30 seconds chair stand test 10 reps Timed up and go (TUG): 13.84 3 minute walk test: level of exertion 14   TODAY'S TREATMENT:                                                                                                                              DATE: 03/12/23: no home inst today    PATIENT EDUCATION: Education details: POC, goals Person educated: Patient Education method: Medical illustrator Education comprehension: verbalized understanding  HOME EXERCISE PROGRAM: na  GOALS: Goals reviewed with patient? Yes  SHORT TERM GOALS: Target date: 03/26/23  I HEP Baseline: to be established Goal status: INITIAL   LONG TERM GOALS: Target date: 06/04/23   Improve with 3 min walk test to perceived level of exertion of 9 Baseline: 14 Goal status: INITIAL  2.  Improve 30 sec sit to stand test to 15 Baseline: 10, ( below norm for her age ) Goal status: INITIAL  3.  Improve FGA to 28/30  Baseline: 20 Goal status: INITIAL  ASSESSMENT:  CLINICAL IMPRESSION: Patient is a 52 y.o. female who was seen today for physical therapy evaluation and treatment for R MCA CVA , along with CHF.  She is utilizing vest to maintain her heart rhythm.  She presents with minimal weakness/ deficits from the CVA.  Primary concern is for her endurance, tolerance to activities due to her CHF with ejection fraction of 20%.  She is having to rely on her daughters and her elderly mother to assist her with basic ADL's which she reports is frustrating and demoralizing.   Will be a good candidate for skilled PT to address her deficits and improve her activity tolerance as well as higher level balance activities.   OBJECTIVE IMPAIRMENTS: cardiopulmonary status limiting activity, decreased activity tolerance, decreased balance, decreased endurance, and pain.   ACTIVITY LIMITATIONS: carrying, lifting, bending, stairs, transfers, bathing, and locomotion level  PARTICIPATION LIMITATIONS: meal prep, cleaning, laundry, driving, shopping, and community activity  PERSONAL FACTORS: Fitness, Past/current experiences, Time since onset of injury/illness/exacerbation, and 1-2 comorbidities: pre diabetic, new onset CHF, CVA  are also affecting patient's functional outcome.   REHAB POTENTIAL: Good  CLINICAL DECISION MAKING: Evolving/moderate complexity  EVALUATION COMPLEXITY: Moderate  PLAN:  PT FREQUENCY: 2x/week  PT DURATION: 12 weeks  PLANNED INTERVENTIONS: Therapeutic exercises, Therapeutic activity, Neuromuscular re-education, Balance training, Gait training, Patient/Family education, Self Care, and Joint mobilization  PLAN FOR NEXT SESSION: initiate cardio training in gym on nustep, light strengthening to pts tolerance B LE's    Analycia Khokhar L Zebediah Beezley, PT 03/12/2023, 5:33 PM

## 2023-03-12 NOTE — Telephone Encounter (Signed)
Noted  

## 2023-03-12 NOTE — Progress Notes (Signed)
Cardiology Office Note:   Date:  03/14/2023  ID:  Stephanie Reilly, DOB 1970-11-06, MRN [id number] PCP: Marcine Matar, MD  [facility] Providers Cardiologist:  Rollene Rotunda, MD Electrophysiologist:  Will Jorja Loa, MD    History of Present Illness:   Stephanie Reilly is a 52 y.o. female with hypertension, hyperlipidemia, type 2 diabetes, obesity and recently diagnosed systolic heart failure.  She was in the hospital with stroke and occlusion of the proximal right M2.  She had PEA arrest and was intubated.    EF was 20%.  Left heart cath demonstrated normal coronaries.    She was treated with Eliquis.  She was managed with meds as below.  He is wearing a life vest.  She has a follow-up echo scheduled in August.  She has follow-up with EP scheduled.  She had an MRI and I reviewed these results.  The ejection fraction was said to be 12%.  The there was a small mid inferior wall subendocardial infarct possible and they could not exclude sarcoid.  A PET scan is ordered.  Remarkably she has done well.  She is not having any tachypalpitations.  She is not feeling any presyncope or syncope.  She has no shortness of breath, PND or orthopnea.  She has had no weight gain or edema.  She has to walk with a walker because she still has a little leg weakness but she says she has no significant residual from the stroke.   ROS: As stated in the HPI and negative for all other systems.  Studies Reviewed:    EKG: Sinus tachycardia, rate 103, left axis deviation, left anterior fascicular block, poor anterior R wave progression, QTc slightly prolonged, diffuse T wave flattening.   Risk Assessment/Calculations:              Physical Exam:   VS:  BP 112/78   Pulse (!) 108   Ht 5\' 2"  (1.575 m)   Wt 248 lb 6.4 oz (112.7 kg)   SpO2 97%   BMI 45.43 kg/m    Wt Readings from Last 3 Encounters:  03/14/23 248 lb 6.4 oz (112.7 kg)  02/28/23 247 lb 12.8 oz (112.4 kg)  02/21/23 248 lb (112.5 kg)      GEN: Well nourished, well developed in no acute distress NECK: No JVD; No carotid bruits CARDIAC: RRR, no murmurs, rubs, gallops RESPIRATORY:  Clear to auscultation without rales, wheezing or rhonchi  ABDOMEN: Soft, non-tender, non-distended EXTREMITIES:  No edema; No deformity   ASSESSMENT AND PLAN:   PEA arrest : After med titration she will be getting her ejection fraction reevaluated.  She is also following up with EP.  She may eventually need a defibrillator.  For now she will continue with the LifeVest.     Right M2 stroke: She is actually done quite well.  Suspect cardiac emboli.  She had mechanical thrombectomy.  She is tolerating anticoagulation.   Acute Renal failure: This resolved.  I will be following up and be met after med change below.   DMII: A1c was 6.1.  He is on [place].  He might try to get her on GLP-1 RA in the future but we have to run this through her pharmacy to see if she can qualify.  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  She does have a PET scan ordered.  Today manage titrate her Entresto to 49/51 twice daily and check a basic metabolic profile in  about 10 days.  Neck step would probably to go up on her carvedilol.  Unfortunately she is having some hair loss may be related to the beta-blocker although could be related to the trauma that she has had.        Signed, Rollene Rotunda, MD

## 2023-03-12 NOTE — Telephone Encounter (Signed)
Called [phone] but phone call went straight to voicemail. Per Stephanie Reilly's understanding on phone call received earlier today 03/12/2023 the patient planned to drop off paperwork this Friday 03/14/2023. Please refer to previous telephone encounter. Please clarify with patient whether paperwork has been returned to [facility].

## 2023-03-12 NOTE — Telephone Encounter (Signed)
Called & LVM informed Stephanie Reilly to return forms to office to make the changes.

## 2023-03-12 NOTE — Telephone Encounter (Signed)
Copied from CRM [id number]. Topic: General - Other >> Mar 12, 2023  3:37 PM Everette C wrote: Reason for CRM: The patient's daughter has called requesting direct contact with C. Alarcon to confirm receipt of FMLA paperwork   Please contact further when possible

## 2023-03-12 NOTE — Telephone Encounter (Signed)
Glynda Jaeger is calling in to say she drop the form off on Friday for the changes to be made.

## 2023-03-13 NOTE — Telephone Encounter (Signed)
Called & spoke to [facility]. Glynda Jaeger was able to email page 3 of the forms for additional information. Form placed on Dr.Johnson's desk.

## 2023-03-14 ENCOUNTER — Ambulatory Visit: Payer: Medicaid Other | Attending: Cardiology | Admitting: Cardiology

## 2023-03-14 ENCOUNTER — Encounter: Payer: Self-pay | Admitting: Cardiology

## 2023-03-14 VITALS — BP 112/78 | HR 108 | Ht 62.0 in | Wt 248.4 lb

## 2023-03-14 DIAGNOSIS — I5022 Chronic systolic (congestive) heart failure: Secondary | ICD-10-CM

## 2023-03-14 DIAGNOSIS — Z7689 Persons encountering health services in other specified circumstances: Secondary | ICD-10-CM | POA: Diagnosis not present

## 2023-03-14 MED ORDER — SACUBITRIL-VALSARTAN 49-51 MG PO TABS
1.0000 | ORAL_TABLET | Freq: Two times a day (BID) | ORAL | 3 refills | Status: DC
Start: 2023-03-14 — End: 2023-07-22

## 2023-03-14 NOTE — Patient Instructions (Signed)
Medication Instructions:   INCREASE ENTRESTO TO 49/51 MG ONE TABLET TWICE DAILY= 2 OF THE 24/26 MG TABLETS TWICE DAILY  *If you need a refill on your cardiac medications before your next appointment, please call your pharmacy*   Lab Work:  Your physician recommends that you return for lab work in:10 DAYS=03/24/23  If you have labs (blood work) drawn today and your tests are completely normal, you will receive your results only by: MyChart Message (if you have MyChart) OR A paper copy in the mail If you have any lab test that is abnormal or we need to change your treatment, we will call you to review the results.   Follow-Up: At [facility], you and your health needs are our priority.  As part of our continuing mission to provide you with exceptional heart care, we have created designated Provider Care Teams.  These Care Teams include your primary Cardiologist (physician) and Advanced Practice Providers (APPs -  Physician Assistants and Nurse Practitioners) who all work together to provide you with the care you need, when you need it.  We recommend signing up for the patient portal called "MyChart".  Sign up information is provided on this After Visit Summary.  MyChart is used to connect with patients for Virtual Visits (Telemedicine).  Patients are able to view lab/test results, encounter notes, upcoming appointments, etc.  Non-urgent messages can be sent to your provider as well.   To learn more about what you can do with MyChart, go to [email or web address].    Your next appointment:   1 month(s)  Provider:   Rollene Rotunda, MD

## 2023-03-17 ENCOUNTER — Telehealth: Payer: Self-pay | Admitting: Internal Medicine

## 2023-03-17 NOTE — Telephone Encounter (Signed)
Medication Refill - Medication: senna-docusate (SENOKOT-S) 8.6-50 MG tablet   Pt stated she needs assistance with paying for her medication. Mentioned [employer] helps her a lot but being on multiple medications and not working she can't afford her medication.   Has the patient contacted their pharmacy? Yes.   No, more refills.   (Agent: If yes, when and what did the pharmacy advise?)  Preferred Pharmacy (with phone number or street name):  [id number] - [place], [place] - [address]  [address] [place] [address]  Phone: [phone] Fax: [phone]  Hours: Not open 24 hours   Has the patient been seen for an appointment in the last year OR does the patient have an upcoming appointment? Yes.    Agent: Please be advised that RX refills may take up to 3 business days. We ask that you follow-up with your pharmacy.

## 2023-03-17 NOTE — Therapy (Signed)
OUTPATIENT PHYSICAL THERAPY NEURO TREATMENT   Patient Name: Stephanie Reilly MRN: [id number] DOB:11-Oct-1970, 52 y.o., female Today's Date: 03/17/2023   PCP: Jonah Blue, MD REFERRING PROVIDER: Brynda Peon, NP  END OF SESSION:    Past Medical History:  Diagnosis Date   Anemia    Anxiety    Arthritis    Depression    Hypertension    Prediabetes    Past Surgical History:  Procedure Laterality Date   ABDOMINAL HYSTERECTOMY     IR CT HEAD LTD  02/15/2023   IR PERCUTANEOUS ART THROMBECTOMY/INFUSION INTRACRANIAL INC DIAG ANGIO  02/15/2023   IR US GUIDE VASC ACCESS RIGHT  02/15/2023   RADIOLOGY WITH ANESTHESIA N/A 02/15/2023   Procedure: IR WITH ANESTHESIA;  Surgeon: Radiologist, Medication, MD;  Location: [facility] OR;  Service: Radiology;  Laterality: N/A;   RIGHT/LEFT HEART CATH AND CORONARY ANGIOGRAPHY N/A 02/10/2023   Procedure: RIGHT/LEFT HEART CATH AND CORONARY ANGIOGRAPHY;  Surgeon: Swazil[place], Peter M, MD;  Location: [facility] INVASIVE CV LAB;  Service: Cardiovascular;  Laterality: N/A;   TONSILLECTOMY     TUBAL LIGATION     Patient Active Problem List   Diagnosis Date Noted   Osteoarthritis of both knees 03/03/2023   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) 02/15/2023   Shock circulatory (HCC) 02/15/2023   Iron deficiency anemia 02/09/2023   Heart failure (HCC) 02/08/2023   Hypokalemia 02/08/2023   Acute on chronic combined systolic and diastolic CHF (congestive heart failure) (HCC) 02/07/2023   Essential hypertension 02/07/2023   Diabetes mellitus type 2, diet-controlled (HCC) 02/07/2023   Elevated troponin 02/07/2023   Obesity, Class III, BMI 40-49.9 (morbid obesity) (HCC) 02/07/2023    ONSET DATE: 02/15/23  REFERRING DIAG: CVA with L hemi  THERAPY DIAG:  No diagnosis found.  Rationale for Evaluation and Treatment: Rehabilitation  SUBJECTIVE:                                                                                                                                                                                              SUBJECTIVE STATEMENT: My arthritis is acting up a little bit today. Fatigue is not as bad as it was when I first got home.   Pt accompanied by: self  PERTINENT HISTORY: h/o CHF hospitalized 5/3 to 02/12/23, returned home and was  Rehospitalized with R thrombotic Middle Cerebral artery, CVA. Hospitalized for 5 days after and sent home.  PAIN:  Are you having pain? Yes: NPRS scale: 7/10 Pain location: R knee  Pain description: throbbing constantly Aggravating factors: weather Relieving factors: heat, rest  PRECAUTIONS: ICD/Pacemaker    WEIGHT BEARING RESTRICTIONS: No  FALLS: Has patient fallen in last 6 months? No  LIVING ENVIRONMENT: Lives with: lives with an adult companion Lives in: House/apartment Stairs: Yes: External: 1 steps; none Has following equipment at home: Walker - 2 wheeled  PLOF: Independent  PATIENT GOALS: I want to be able to walk and stand longer without having to sit down, I want to be able to get out of shower without relying on my 34 y.o mother to help  OBJECTIVE:   DIAGNOSTIC FINDINGS: na  COGNITION: Overall cognitive status: Within functional limits for tasks assessed   SENSATION: WFL  COORDINATION: Toe tapping, marching wfl B  EDEMA: none noted today   MUSCLE TONE: wnl   POSTURE: forward head and weight shift left  LOWER EXTREMITY ROM:   all wfl, UE Rom wfl    LOWER EXTREMITY MMT:    MMT Right Eval Left Eval  Hip flexion    Hip extension    Hip abduction    Hip adduction    Hip internal rotation    Hip external rotation    Knee flexion    Knee extension 4+ 4+  Ankle dorsiflexion    Ankle plantarflexion    Ankle inversion    Ankle eversion    (Blank rows = not tested)  GAIT: Gait pattern: wide BOS Distance walked: 240' Assistive device utilized: Environmental consultant - 2 wheeled Level of assistance: Modified independence Comments: used walker to arrive to appt,  functional tests performed without device  FUNCTIONAL TESTS:  30 seconds chair stand test 10 reps Timed up and go (TUG): 13.84 3 minute walk test: level of exertion 14   TODAY'S TREATMENT:                                                                                                                              DATE:  03/18/23 NuStep L5 x11mins  STS 2x10  Walking laps in gym 5 laps without walker  Standing marches 2# 20 reps alt  Standing hip abd 2# 2x10  LAQ 2x10 2#  Step ups 6" Walking on beam   03/12/23: no home inst today    PATIENT EDUCATION: Education details: POC, goals Person educated: Patient Education method: Medical illustrator Education comprehension: verbalized understanding  HOME EXERCISE PROGRAM: na  GOALS: Goals reviewed with patient? Yes  SHORT TERM GOALS: Target date: 03/26/23  I HEP Baseline: to be established Goal status: INITIAL   LONG TERM GOALS: Target date: 06/04/23   Improve with 3 min walk test to perceived level of exertion of 9 Baseline: 14 Goal status: INITIAL  2.  Improve 30 sec sit to stand test to 15 Baseline: 10, ( below norm for her age ) Goal status: INITIAL  3.  Improve FGA to 28/30 Baseline: 20 Goal status: INITIAL  ASSESSMENT:  CLINICAL IMPRESSION: Patient is a 52 y.o. female who was seen today for physical therapy treatment for R MCA CVA , along with CHF.  She is utilizing  vest to maintain her heart rhythm. She is able to demonstrate good endurance and stability with gait by being able to walk 5 laps around the gym without her walker. Does well throughout session today, some fatigue. Will be a good candidate for skilled PT to address her deficits and improve her activity tolerance as well as higher level balance activities.   OBJECTIVE IMPAIRMENTS: cardiopulmonary status limiting activity, decreased activity tolerance, decreased balance, decreased endurance, and pain.   ACTIVITY LIMITATIONS: carrying, lifting,  bending, stairs, transfers, bathing, and locomotion level  PARTICIPATION LIMITATIONS: meal prep, cleaning, laundry, driving, shopping, and community activity  PERSONAL FACTORS: Fitness, Past/current experiences, Time since onset of injury/illness/exacerbation, and 1-2 comorbidities: pre diabetic, new onset CHF, CVA  are also affecting patient's functional outcome.   REHAB POTENTIAL: Good  CLINICAL DECISION MAKING: Evolving/moderate complexity  EVALUATION COMPLEXITY: Moderate  PLAN:  PT FREQUENCY: 2x/week  PT DURATION: 12 weeks  PLANNED INTERVENTIONS: Therapeutic exercises, Therapeutic activity, Neuromuscular re-education, Balance training, Gait training, Patient/Family education, Self Care, and Joint mobilization  PLAN FOR NEXT SESSION: initiate cardio training in gym on nustep, light strengthening to pts tolerance B LE's    Cassie Freer, PT 03/17/2023, 5:21 PM

## 2023-03-17 NOTE — Telephone Encounter (Signed)
Called & LVM for Mikaela to inform that paperwork is ready for pick-up and will be left at the front desk.

## 2023-03-18 ENCOUNTER — Ambulatory Visit: Payer: Medicaid Other

## 2023-03-18 ENCOUNTER — Other Ambulatory Visit: Payer: Self-pay

## 2023-03-18 DIAGNOSIS — R6889 Other general symptoms and signs: Secondary | ICD-10-CM

## 2023-03-18 DIAGNOSIS — I69352 Hemiplegia and hemiparesis following cerebral infarction affecting left dominant side: Secondary | ICD-10-CM

## 2023-03-18 DIAGNOSIS — Z7689 Persons encountering health services in other specified circumstances: Secondary | ICD-10-CM | POA: Diagnosis not present

## 2023-03-19 ENCOUNTER — Telehealth: Payer: Self-pay | Admitting: Internal Medicine

## 2023-03-19 NOTE — Telephone Encounter (Signed)
Copied from CRM [id number]. Topic: General - Other >> Mar 19, 2023  9:23 AM Turkey B wrote: Reason for CRM: pt called in about baseline form for weight watchers program with Rolene Arbour, aslos says she doesn't need another mammogram because she already had one February of this year

## 2023-03-19 NOTE — Telephone Encounter (Signed)
Pt called in again about senokot. I let her know that another alternative is being looked into.

## 2023-03-20 MED ORDER — POLYETHYLENE GLYCOL 3350 17 G PO PACK: 17.0000 g | PACK | Freq: Every day | ORAL | 3 refills | Status: DC | PRN

## 2023-03-20 MED FILL — Medication: Qty: 1 | Status: AC

## 2023-03-20 NOTE — Telephone Encounter (Addendum)
Spoke with patient . Verified name & DOB   Patient aware that medication was sent to her pharmary and was advised of the additional recommendations given by her PCP

## 2023-03-20 NOTE — Telephone Encounter (Signed)
Called & spoke to the patient. Verified name & DOB. Informed patient to come in to sign ROI to obtain the mammogram report. Patient expressed verbal understanding. Patient dropped off forms for [facility] Weight Watchers Program. Forms placed in provider basket for completion.

## 2023-03-21 ENCOUNTER — Encounter: Payer: Self-pay | Admitting: Physical Therapy

## 2023-03-21 ENCOUNTER — Ambulatory Visit: Payer: Medicaid Other | Admitting: Physical Therapy

## 2023-03-21 DIAGNOSIS — I69352 Hemiplegia and hemiparesis following cerebral infarction affecting left dominant side: Secondary | ICD-10-CM

## 2023-03-21 DIAGNOSIS — R6889 Other general symptoms and signs: Secondary | ICD-10-CM

## 2023-03-21 DIAGNOSIS — Z7689 Persons encountering health services in other specified circumstances: Secondary | ICD-10-CM | POA: Diagnosis not present

## 2023-03-21 NOTE — Therapy (Signed)
OUTPATIENT PHYSICAL THERAPY NEURO TREATMENT   Patient Name: Stephanie Reilly MRN: [id number] DOB:[birth year], 52 y.o., female Today's Date: 03/21/2023   PCP: Jonah Blue, MD REFERRING PROVIDER: Brynda Peon, NP  END OF SESSION:  PT End of Session - 03/21/23 0955     Visit Number 3    Date for PT Re-Evaluation 06/04/23    PT Start Time 0955    PT Stop Time 1048    PT Time Calculation (min) 53 min    Activity Tolerance Patient limited by fatigue    Behavior During Therapy [facility] for tasks assessed/performed              Past Medical History:  Diagnosis Date   Anemia    Anxiety    Arthritis    Depression    Hypertension    Prediabetes    Past Surgical History:  Procedure Laterality Date   ABDOMINAL HYSTERECTOMY     IR CT HEAD LTD  02/15/2023   IR PERCUTANEOUS ART THROMBECTOMY/INFUSION INTRACRANIAL INC DIAG ANGIO  02/15/2023   IR US GUIDE VASC ACCESS RIGHT  02/15/2023   RADIOLOGY WITH ANESTHESIA N/A 02/15/2023   Procedure: IR WITH ANESTHESIA;  Surgeon: Radiologist, Medication, MD;  Location: [facility] OR;  Service: Radiology;  Laterality: N/A;   RIGHT/LEFT HEART CATH AND CORONARY ANGIOGRAPHY N/A 02/10/2023   Procedure: RIGHT/LEFT HEART CATH AND CORONARY ANGIOGRAPHY;  Surgeon: Swazil[place], Peter M, MD;  Location: [facility] INVASIVE CV LAB;  Service: Cardiovascular;  Laterality: N/A;   TONSILLECTOMY     TUBAL LIGATION     Patient Active Problem List   Diagnosis Date Noted   Osteoarthritis of both knees 03/03/2023   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) 02/15/2023   Shock circulatory (HCC) 02/15/2023   Iron deficiency anemia 02/09/2023   Heart failure (HCC) 02/08/2023   Hypokalemia 02/08/2023   Acute on chronic combined systolic and diastolic CHF (congestive heart failure) (HCC) 02/07/2023   Essential hypertension 02/07/2023   Diabetes mellitus type 2, diet-controlled (HCC) 02/07/2023   Elevated troponin 02/07/2023   Obesity, Class III, BMI 40-49.9 (morbid obesity)  (HCC) 02/07/2023    ONSET DATE: 02/15/23  REFERRING DIAG: CVA with L hemi  THERAPY DIAG:  Hemiplegia and hemiparesis following cerebral infarction affecting left dominant side (HCC)  Impaired tolerance of activity  Rationale for Evaluation and Treatment: Rehabilitation  SUBJECTIVE:                                                                                                                                                                                             SUBJECTIVE STATEMENT: Got a catch in my hip today,  I am tired and a little sore after PT  Pt accompanied by: self  PERTINENT HISTORY: h/o CHF hospitalized 5/3 to 02/12/23, returned home and was  Rehospitalized with R thrombotic Middle Cerebral artery, CVA. Hospitalized for 5 days after and sent home.  PAIN:  Are you having pain? Yes: NPRS scale: 5/10 Pain location: R knee  Pain description: throbbing constantly Aggravating factors: weather Relieving factors: heat, rest  PRECAUTIONS: ICD/Pacemaker    WEIGHT BEARING RESTRICTIONS: No  FALLS: Has patient fallen in last 6 months? No  LIVING ENVIRONMENT: Lives with: lives with an adult companion Lives in: House/apartment Stairs: Yes: External: 1 steps; none Has following equipment at home: Walker - 2 wheeled  PLOF: Independent  PATIENT GOALS: I want to be able to walk and stand longer without having to sit down, I want to be able to get out of shower without relying on my 27 y.o mother to help  OBJECTIVE:   DIAGNOSTIC FINDINGS: na  COGNITION: Overall cognitive status: Within functional limits for tasks assessed   SENSATION: WFL  COORDINATION: Toe tapping, marching wfl B  EDEMA: none noted today   MUSCLE TONE: wnl   POSTURE: forward head and weight shift left  LOWER EXTREMITY ROM:   all wfl, UE Rom wfl    LOWER EXTREMITY MMT:    MMT Right Eval Left Eval  Hip flexion    Hip extension    Hip abduction    Hip adduction    Hip internal  rotation    Hip external rotation    Knee flexion    Knee extension 4+ 4+  Ankle dorsiflexion    Ankle plantarflexion    Ankle inversion    Ankle eversion    (Blank rows = not tested)  GAIT: Gait pattern: wide BOS Distance walked: 240' Assistive device utilized: Environmental consultant - 2 wheeled Level of assistance: Modified independence Comments: used walker to arrive to appt, functional tests performed without device  FUNCTIONAL TESTS:  30 seconds chair stand test 10 reps Timed up and go (TUG): 13.84 3 minute walk test: level of exertion 14   TODAY'S TREATMENT:                                                                                                                              DATE:  03/21/23 Nustep level 5 x 6 minutes HR up to 140 bpm after Nustep Sit 2 minutes HR 105 bpm, she does report a normally fast HR 3 minute walk 500 feet HR up to 138 bpm 2 minute rest down to 93 2# marches, hip abduction and extension 2 x10 each leg Leg curls 20# 2 x10 Leg extenison 5# 2x10 Volley ball with some steps Ball kicks Walking direction changes  03/18/23 NuStep L5 x17mins  STS 2x10  Walking laps in gym 5 laps without walker  Standing marches 2# 20 reps alt  Standing hip abd 2# 2x10  LAQ 2x10 2#  Step ups 6"  Walking on beam   03/12/23: no home inst today    PATIENT EDUCATION: Education details: POC, goals Person educated: Patient Education method: Medical illustrator Education comprehension: verbalized understanding  HOME EXERCISE PROGRAM: na  GOALS: Goals reviewed with patient? Yes  SHORT TERM GOALS: Target date: 03/26/23  I HEP Baseline: to be established Goal status: met 03/21/23   LONG TERM GOALS: Target date: 06/04/23   Improve with 3 min walk test to perceived level of exertion of 9 Baseline: 14 Goal status: INITIAL  2.  Improve 30 sec sit to stand test to 15 Baseline: 10, ( below norm for her age ) Goal status: INITIAL  3.  Improve FGA to  28/30 Baseline: 20 Goal status: INITIAL  ASSESSMENT:  CLINICAL IMPRESSION: Patient is a 52 y.o. female who was seen today for physical therapy treatment for R MCA CVA , along with CHF.  She is utilizing vest to maintain her heart rhythm. Patient improving with over all ability, she has a good revoery in 2 minutes once her HR is up, ablke to do some balance activities without difficulty.   good candidate for skilled PT to address her deficits and improve her activity tolerance as well as higher level balance activities.   OBJECTIVE IMPAIRMENTS: cardiopulmonary status limiting activity, decreased activity tolerance, decreased balance, decreased endurance, and pain.   ACTIVITY LIMITATIONS: carrying, lifting, bending, stairs, transfers, bathing, and locomotion level  PARTICIPATION LIMITATIONS: meal prep, cleaning, laundry, driving, shopping, and community activity  PERSONAL FACTORS: Fitness, Past/current experiences, Time since onset of injury/illness/exacerbation, and 1-2 comorbidities: pre diabetic, new onset CHF, CVA  are also affecting patient's functional outcome.   REHAB POTENTIAL: Good  CLINICAL DECISION MAKING: Evolving/moderate complexity  EVALUATION COMPLEXITY: Moderate  PLAN:  PT FREQUENCY: 2x/week  PT DURATION: 12 weeks  PLANNED INTERVENTIONS: Therapeutic exercises, Therapeutic activity, Neuromuscular re-education, Balance training, Gait training, Patient/Family education, Self Care, and Joint mobilization  PLAN FOR NEXT SESSION: continue to push her abilities   Jearld Lesch, PT 03/21/2023, 9:56 AM

## 2023-03-22 ENCOUNTER — Other Ambulatory Visit: Payer: Self-pay

## 2023-03-22 ENCOUNTER — Emergency Department (HOSPITAL_BASED_OUTPATIENT_CLINIC_OR_DEPARTMENT_OTHER)
Admission: EM | Admit: 2023-03-22 | Discharge: 2023-03-22 | Disposition: A | Discharge status: Home / Self Care | Payer: Medicaid Other | Attending: Emergency Medicine | Admitting: Emergency Medicine

## 2023-03-22 ENCOUNTER — Encounter (HOSPITAL_BASED_OUTPATIENT_CLINIC_OR_DEPARTMENT_OTHER): Payer: Self-pay | Admitting: Emergency Medicine

## 2023-03-22 DIAGNOSIS — Z79899 Other long term (current) drug therapy: Secondary | ICD-10-CM | POA: Insufficient documentation

## 2023-03-22 DIAGNOSIS — R21 Rash and other nonspecific skin eruption: Secondary | ICD-10-CM | POA: Diagnosis not present

## 2023-03-22 DIAGNOSIS — I10 Essential (primary) hypertension: Secondary | ICD-10-CM | POA: Diagnosis not present

## 2023-03-22 DIAGNOSIS — Z7901 Long term (current) use of anticoagulants: Secondary | ICD-10-CM | POA: Insufficient documentation

## 2023-03-22 DIAGNOSIS — Z91040 Latex allergy status: Secondary | ICD-10-CM | POA: Diagnosis not present

## 2023-03-22 DIAGNOSIS — I420 Dilated cardiomyopathy: Secondary | ICD-10-CM | POA: Diagnosis not present

## 2023-03-22 DIAGNOSIS — Z7689 Persons encountering health services in other specified circumstances: Secondary | ICD-10-CM | POA: Diagnosis not present

## 2023-03-22 MED ORDER — TRIAMCINOLONE ACETONIDE 0.1 % EX CREA: 1.0000 | TOPICAL_CREAM | Freq: Two times a day (BID) | CUTANEOUS | 0 refills | Status: DC

## 2023-03-22 NOTE — Discharge Instructions (Addendum)
It was a pleasure taking care of you today!   Ensure to keep skin moisturized, you may use over-the-counter Aveeno or Eucerin cream. Use the Triamcinolone cream to the body for no more than 5 days. You may continue with your zyrtec as directed. It is important that you do not scratch/pick at the area. Follow up with your care team as needed. Return to the ED if you are experiencing increasing/worsening symptoms.

## 2023-03-22 NOTE — ED Triage Notes (Signed)
Pt has rash under her medical device.  Started itching May 30, has worsened and noted rash 2 weeks later.

## 2023-03-22 NOTE — ED Provider Notes (Signed)
[place] [facility] AT [facility] Provider Note   CSN: [id number] Arrival date & time: 03/22/23  4098     History  Chief Complaint  Patient presents with   Rash    Stephanie Reilly is a 52 y.o. female with a PMHx of HTN, pre-DM who presents to the ED with concerns for rash onset 03/06/2023. She reports that she was placed on a cardiac LifeVest following her recent admission.  Notes that she has sensitive skin and since having the cardiac LifeVest on she has developed a rash underneath her left breast and now the right breast.  Patient has a follow-up appointment with her cardiologist on July 17.  Has not tried any medications at home.  Denies chest pain, shortness of breath, fever, drainage.   The history is provided by the patient. No language interpreter was used.       Home Medications Prior to Admission medications   Medication Sig Start Date End Date Taking? Authorizing Provider  triamcinolone cream (KENALOG) 0.1 % Apply 1 Application topically 2 (two) times daily. 03/22/23  Yes Eva Vallee A, PA-C  apixaban (ELIQUIS) 5 MG TABS tablet Take 1 tablet (5 mg total) by mouth 2 (two) times daily. 02/21/23   Marcine Matar, MD  Ascorbic Acid (VITAMIN C) 100 MG tablet Take 100 mg by mouth daily.    [provider]  Biotin (BIOTIN MAXIMUM STRENGTH) 10 MG TABS Take 10 mg by mouth daily. 08/04/17   [provider]  carvedilol (COREG) 6.25 MG tablet Take 1 tablet (6.25 mg total) by mouth 2 (two) times daily with a meal. 02/28/23   Alen Bleacher, NP  cetirizine (ZYRTEC) 10 MG tablet Take 10 mg by mouth daily.    [provider]  dapagliflozin propanediol (FARXIGA) 10 MG TABS tablet Take 1 tablet (10 mg total) by mouth daily. 02/21/23   Marcine Matar, MD  digoxin (LANOXIN) 0.125 MG tablet Take 1 tablet (0.125 mg total) by mouth daily. 02/21/23   Marcine Matar, MD  Multiple Vitamins-Minerals (HAIR SKIN & NAILS PO) Take 2 tablets by  mouth daily. Patient not taking: Reported on 03/14/2023    [provider]  polyethylene glycol (MIRALAX / GLYCOLAX) 17 g packet Take 17 g by mouth daily as needed. 03/20/23   Marcine Matar, MD  rosuvastatin (CRESTOR) 20 MG tablet Take 1 tablet (20 mg total) by mouth daily. 02/21/23   Marcine Matar, MD  sacubitril-valsartan (ENTRESTO) 49-51 MG Take 1 tablet by mouth 2 (two) times daily. 03/14/23   Rollene Rotunda, MD  senna-docusate (SENOKOT-S) 8.6-50 MG tablet Take 1 tablet by mouth at bedtime as needed for mild constipation. 02/20/23   Lynnae January, NP  spironolactone (ALDACTONE) 25 MG tablet Take 1 tablet (25 mg total) by mouth daily. 02/21/23   Marcine Matar, MD  torsemide (DEMADEX) 20 MG tablet Take 1 tablet (20 mg total) by mouth daily as needed (for increased edema). 02/28/23   Alen Bleacher, NP      Allergies    Paroxetine hcl, Prednisone, Chocolate, Ibuprofen, Iron, Latex, Lisinopril, Naproxen, Tramadol, Codeine, Penicillins, and Serotonin    Review of Systems   Review of Systems  Skin:  Positive for rash.  All other systems reviewed and are negative.   Physical Exam Updated Vital Signs BP (!) 144/113 (BP Location: Left Arm)   Pulse (!) 116   Temp 99.1 F (37.3 C) (Oral)   Resp 18   Ht 5'  2" (1.575 m)   Wt 112.7 kg   SpO2 95%   BMI 45.43 kg/m  Physical Exam Vitals and nursing note reviewed.  Constitutional:      General: She is not in acute distress.    Appearance: Normal appearance.  Eyes:     General: No scleral icterus.    Extraocular Movements: Extraocular movements intact.  Cardiovascular:     Rate and Rhythm: Normal rate.  Pulmonary:     Effort: Pulmonary effort is normal. No respiratory distress.  Chest:     Comments: Life vest in place. Area of skin breakdown and excoriation marks noted to inframammary fold of bilateral breast, more so to left breast. No TTP noted to the area.  Abdominal:     Palpations: Abdomen is soft. There is no mass.      Tenderness: There is no abdominal tenderness.  Musculoskeletal:        General: Normal range of motion.     Cervical back: Neck supple.  Skin:    General: Skin is warm and dry.     Findings: No rash.  Neurological:     Mental Status: She is alert.     Sensory: Sensation is intact.     Motor: Motor function is intact.  Psychiatric:        Behavior: Behavior normal.     ED Results / Procedures / Treatments   Labs (all labs ordered are listed, but only abnormal results are displayed) Labs Reviewed - No data to display  EKG None  Radiology No results found.  Procedures Procedures    Medications Ordered in ED Medications - No data to display  ED Course/ Medical Decision Making/ A&P                             Medical Decision Making Risk Prescription drug management.   Pt presents with concerns for rash onset 03/06/2023.  Patient has been manage began after she was placed on a cardiac LifeVest.  Patient afebrile. On exam, pt with Life vest in place. Area of skin breakdown and excoriation marks noted to inframammary fold of bilateral breast, more so to left breast. No TTP noted to the area. No acute cardiovascular, respiratory, abdominal exam findings. Differential diagnosis includes cellulitis, abscess, contact dermatitis.    Co morbidities that complicate the patient evaluation: Hypertension, prediabetes   Disposition: Presentation suspicious for likely contact dermatitis as cause of patient's rash.  Doubt concerns at this time for cellulitis or abscess. After consideration of the diagnostic results and the patients response to treatment, I feel that the patient would benefit from Discharge home.  Patient will be discharged home with a prescription for Kenalog cream.  Also instructed to follow-up with her cardiologist regarding use of the LifeVest.  Instructed patient to continue taking her Zyrtec as prescribed.  Supportive care measures and strict return  precautions discussed with patient at bedside. Pt acknowledges and verbalizes understanding. Pt appears safe for discharge. Follow up as indicated in discharge paperwork.    This chart was dictated using voice recognition software, Dragon. Despite the best efforts of this provider to proofread and correct errors, errors may still occur which can change documentation meaning.  Final Clinical Impression(s) / ED Diagnoses Final diagnoses:  Rash    Rx / DC Orders ED Discharge Orders          Ordered    triamcinolone cream (KENALOG) 0.1 %  2 times daily  03/22/23 1029              Raivyn Kabler A, PA-C 03/22/23 1049    Ernie Avena, MD 03/22/23 1215

## 2023-03-24 ENCOUNTER — Ambulatory Visit: Payer: Medicaid Other

## 2023-03-24 ENCOUNTER — Telehealth: Payer: Self-pay | Admitting: Internal Medicine

## 2023-03-24 DIAGNOSIS — Z7689 Persons encountering health services in other specified circumstances: Secondary | ICD-10-CM | POA: Diagnosis not present

## 2023-03-24 DIAGNOSIS — R6889 Other general symptoms and signs: Secondary | ICD-10-CM

## 2023-03-24 DIAGNOSIS — I5022 Chronic systolic (congestive) heart failure: Secondary | ICD-10-CM | POA: Diagnosis not present

## 2023-03-24 DIAGNOSIS — I69352 Hemiplegia and hemiparesis following cerebral infarction affecting left dominant side: Secondary | ICD-10-CM | POA: Diagnosis not present

## 2023-03-24 NOTE — Therapy (Signed)
OUTPATIENT PHYSICAL THERAPY NEURO TREATMENT   Patient Name: Stephanie Reilly MRN: [id number] DOB:[birth year], 52 y.o., female Today's Date: 03/24/2023   PCP: Jonah Blue, MD REFERRING PROVIDER: Brynda Peon, NP  END OF SESSION:     Past Medical History:  Diagnosis Date   Anemia    Anxiety    Arthritis    Depression    Hypertension    Prediabetes    Past Surgical History:  Procedure Laterality Date   ABDOMINAL HYSTERECTOMY     IR CT HEAD LTD  02/15/2023   IR PERCUTANEOUS ART THROMBECTOMY/INFUSION INTRACRANIAL INC DIAG ANGIO  02/15/2023   IR US GUIDE VASC ACCESS RIGHT  02/15/2023   RADIOLOGY WITH ANESTHESIA N/A 02/15/2023   Procedure: IR WITH ANESTHESIA;  Surgeon: Radiologist, Medication, MD;  Location: [facility] OR;  Service: Radiology;  Laterality: N/A;   RIGHT/LEFT HEART CATH AND CORONARY ANGIOGRAPHY N/A 02/10/2023   Procedure: RIGHT/LEFT HEART CATH AND CORONARY ANGIOGRAPHY;  Surgeon: [place], Peter M, MD;  Location: [facility] INVASIVE CV LAB;  Service: Cardiovascular;  Laterality: N/A;   TONSILLECTOMY     TUBAL LIGATION     Patient Active Problem List   Diagnosis Date Noted   Osteoarthritis of both knees 03/03/2023   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) 02/15/2023   Shock circulatory (HCC) 02/15/2023   Iron deficiency anemia 02/09/2023   Heart failure (HCC) 02/08/2023   Hypokalemia 02/08/2023   Acute on chronic combined systolic and diastolic CHF (congestive heart failure) (HCC) 02/07/2023   Essential hypertension 02/07/2023   Diabetes mellitus type 2, diet-controlled (HCC) 02/07/2023   Elevated troponin 02/07/2023   Obesity, Class III, BMI 40-49.9 (morbid obesity) (HCC) 02/07/2023    ONSET DATE: 02/15/23  REFERRING DIAG: CVA with L hemi  THERAPY DIAG:  No diagnosis found.  Rationale for Evaluation and Treatment: Rehabilitation  SUBJECTIVE:                                                                                                                                                                                              SUBJECTIVE STATEMENT: I am alright both of my knees are hurting today. I'm at about a 13/10.   Pt accompanied by: self  PERTINENT HISTORY: h/o CHF hospitalized 5/3 to 02/12/23, returned home and was  Rehospitalized with R thrombotic Middle Cerebral artery, CVA. Hospitalized for 5 days after and sent home.  PAIN:  Are you having pain? Yes: NPRS scale: 5/10 Pain location: R knee  Pain description: throbbing constantly Aggravating factors: weather Relieving factors: heat, rest  PRECAUTIONS: ICD/Pacemaker    WEIGHT BEARING RESTRICTIONS: No  FALLS: Has patient fallen in  last 6 months? No  LIVING ENVIRONMENT: Lives with: lives with an adult companion Lives in: House/apartment Stairs: Yes: External: 1 steps; none Has following equipment at home: Walker - 2 wheeled  PLOF: Independent  PATIENT GOALS: I want to be able to walk and stand longer without having to sit down, I want to be able to get out of shower without relying on my 63 y.o mother to help  OBJECTIVE:   DIAGNOSTIC FINDINGS: na  COGNITION: Overall cognitive status: Within functional limits for tasks assessed   SENSATION: WFL  COORDINATION: Toe tapping, marching wfl B  EDEMA: none noted today   MUSCLE TONE: wnl   POSTURE: forward head and weight shift left  LOWER EXTREMITY ROM:   all wfl, UE Rom wfl    LOWER EXTREMITY MMT:    MMT Right Eval Left Eval  Hip flexion    Hip extension    Hip abduction    Hip adduction    Hip internal rotation    Hip external rotation    Knee flexion    Knee extension 4+ 4+  Ankle dorsiflexion    Ankle plantarflexion    Ankle inversion    Ankle eversion    (Blank rows = not tested)  GAIT: Gait pattern: wide BOS Distance walked: 240' Assistive device utilized: Environmental consultant - 2 wheeled Level of assistance: Modified independence Comments: used walker to arrive to appt, functional tests performed  without device  FUNCTIONAL TESTS:  30 seconds chair stand test 10 reps Timed up and go (TUG): 13.84 3 minute walk test: level of exertion 14   TODAY'S TREATMENT:                                                                                                                              DATE:  03/24/23 STS on airex 2x10  Side steps over obstacles  NuStep L5 x64mins  Leg ext 5# 2x10 HS curls 20# 2x10 Walking 2 laps in gym    03/21/23 Nustep level 5 x 6 minutes HR up to 140 bpm after Nustep Sit 2 minutes HR 105 bpm, she does report a normally fast HR 3 minute walk 500 feet HR up to 138 bpm 2 minute rest down to 93 2# marches, hip abduction and extension 2 x10 each leg Leg curls 20# 2 x10 Leg extenison 5# 2x10 Volley ball with some steps Ball kicks Walking direction changes  03/18/23 NuStep L5 x43mins  STS 2x10  Walking laps in gym 5 laps without walker  Standing marches 2# 20 reps alt  Standing hip abd 2# 2x10  LAQ 2x10 2#  Step ups 6" Walking on beam   03/12/23: no home inst today    PATIENT EDUCATION: Education details: POC, goals Person educated: Patient Education method: Medical illustrator Education comprehension: verbalized understanding  HOME EXERCISE PROGRAM: na  GOALS: Goals reviewed with patient? Yes  SHORT TERM GOALS: Target date: 03/26/23  I HEP Baseline: to be  established Goal status: met 03/21/23   LONG TERM GOALS: Target date: 06/04/23   Improve with 3 min walk test to perceived level of exertion of 9 Baseline: 14 Goal status: INITIAL  2.  Improve 30 sec sit to stand test to 15 Baseline: 10, ( below norm for her age ) Goal status: INITIAL  3.  Improve FGA to 28/30 Baseline: 20 Goal status: INITIAL  ASSESSMENT:  CLINICAL IMPRESSION: Patient is a 52 y.o. female who was seen today for physical therapy treatment for R MCA CVA , along with CHF.  Patient returns with high pain levels in knees. Reports it is limiting her ability to  do activity today. We did as much as she could tolerate. Continue to address her deficits and improve her activity tolerance as well as higher level balance activities.    OBJECTIVE IMPAIRMENTS: cardiopulmonary status limiting activity, decreased activity tolerance, decreased balance, decreased endurance, and pain.   ACTIVITY LIMITATIONS: carrying, lifting, bending, stairs, transfers, bathing, and locomotion level  PARTICIPATION LIMITATIONS: meal prep, cleaning, laundry, driving, shopping, and community activity  PERSONAL FACTORS: Fitness, Past/current experiences, Time since onset of injury/illness/exacerbation, and 1-2 comorbidities: pre diabetic, new onset CHF, CVA  are also affecting patient's functional outcome.   REHAB POTENTIAL: Good  CLINICAL DECISION MAKING: Evolving/moderate complexity  EVALUATION COMPLEXITY: Moderate  PLAN:  PT FREQUENCY: 2x/week  PT DURATION: 12 weeks  PLANNED INTERVENTIONS: Therapeutic exercises, Therapeutic activity, Neuromuscular re-education, Balance training, Gait training, Patient/Family education, Self Care, and Joint mobilization  PLAN FOR NEXT SESSION: continue to push her abilities   Cassie Freer, PT 03/24/2023, 1:58 PM

## 2023-03-24 NOTE — Telephone Encounter (Signed)
Copied from CRM [id number]. Topic: General - Other >> Mar 24, 2023  2:14 PM Franchot Heidelberg wrote: Reason for CRM: Pt's daughter called regarding FMLA paperwork and is hoping to retrieve them in office this Friday. She says she apologizes that her job is being so difficult but she appreciates everyone's efforts.

## 2023-03-25 DIAGNOSIS — Z7689 Persons encountering health services in other specified circumstances: Secondary | ICD-10-CM | POA: Diagnosis not present

## 2023-03-25 LAB — BASIC METABOLIC PANEL
BUN/Creatinine Ratio: 17 (ref 9–23)
BUN: 17 mg/dL (ref 6–24)
CO2: 22 mmol/L (ref 20–29)
Calcium: 10.7 mg/dL — ABNORMAL HIGH (ref 8.7–10.2)
Chloride: 101 mmol/L (ref 96–106)
Creatinine, Ser: 1.01 mg/dL — ABNORMAL HIGH (ref 0.57–1.00)
Glucose: 112 mg/dL — ABNORMAL HIGH (ref 70–99)
Potassium: 4.7 mmol/L (ref 3.5–5.2)
Sodium: 144 mmol/L (ref 134–144)
eGFR: 67 mL/min/{1.73_m2} (ref >59)

## 2023-03-26 ENCOUNTER — Other Ambulatory Visit: Payer: Self-pay

## 2023-03-26 ENCOUNTER — Telehealth: Payer: Self-pay | Admitting: Cardiology

## 2023-03-26 ENCOUNTER — Telehealth: Payer: Self-pay | Admitting: Internal Medicine

## 2023-03-26 ENCOUNTER — Telehealth: Payer: Self-pay

## 2023-03-26 ENCOUNTER — Ambulatory Visit: Payer: Medicaid Other

## 2023-03-26 DIAGNOSIS — I69352 Hemiplegia and hemiparesis following cerebral infarction affecting left dominant side: Secondary | ICD-10-CM | POA: Diagnosis not present

## 2023-03-26 DIAGNOSIS — R6889 Other general symptoms and signs: Secondary | ICD-10-CM

## 2023-03-26 NOTE — Telephone Encounter (Signed)
Spoke to patient she was calling to ask if ok to take Covid,shingles,tetanus vaccines at same time.I will send message to our PharmD for advice.

## 2023-03-26 NOTE — Telephone Encounter (Signed)
Pt would like a callback regarding whether she is able to get COVID, Shingles booster shots together or separately. Please advise. Marland Kitchen

## 2023-03-26 NOTE — Telephone Encounter (Signed)
Called & spoke to the patient's daughter Glynda Jaeger. Forms were sent for additional information to be added.

## 2023-03-26 NOTE — Telephone Encounter (Signed)
Error

## 2023-03-26 NOTE — Telephone Encounter (Signed)
Attempt to call the pt, unable to leave VM message .

## 2023-03-26 NOTE — Telephone Encounter (Signed)
Patient was returning call. Please advise ?

## 2023-03-26 NOTE — Telephone Encounter (Signed)
Pt requests that a nurse return her call to discuss options on where she can go to have her foot evaluated, get a pap smear, and have a kidney urinalysis for diabetes since Dr. Laural Benes is out of the office on vacation. Cb# [phone]

## 2023-03-26 NOTE — Telephone Encounter (Signed)
Yes, is safe. She should be aware however that tetanus and Shingrix tend to lead to muscle soreness

## 2023-03-26 NOTE — Telephone Encounter (Signed)
Called, spoke with patient in regards to her concerns about her overdue health maintenance taken care of.Getting a pap smear, foot exam, cologuard, and Hep C. She has a up coming OV with Dr. Laural Benes 05/26/23 and would like to discuss having these things done at that OV.

## 2023-03-26 NOTE — Therapy (Signed)
OUTPATIENT PHYSICAL THERAPY NEURO TREATMENT   Patient Name: Stephanie Reilly MRN: [id number] DOB:1971-06-04, 52 y.o., female Today's Date: 03/26/2023   PCP: Jonah Blue, MD REFERRING PROVIDER: Brynda Peon, NP  END OF SESSION:  PT End of Session - 03/26/23 1403     Visit Number 5    Date for PT Re-Evaluation 06/04/23    Progress Note Due on Visit 10    PT Start Time 1356    PT Stop Time 1435    PT Time Calculation (min) 39 min    Activity Tolerance Patient limited by fatigue    Behavior During Therapy [facility] for tasks assessed/performed               Past Medical History:  Diagnosis Date   Anemia    Anxiety    Arthritis    Depression    Hypertension    Prediabetes    Past Surgical History:  Procedure Laterality Date   ABDOMINAL HYSTERECTOMY     IR CT HEAD LTD  02/15/2023   IR PERCUTANEOUS ART THROMBECTOMY/INFUSION INTRACRANIAL INC DIAG ANGIO  02/15/2023   IR US GUIDE VASC ACCESS RIGHT  02/15/2023   RADIOLOGY WITH ANESTHESIA N/A 02/15/2023   Procedure: IR WITH ANESTHESIA;  Surgeon: Radiologist, Medication, MD;  Location: [facility] OR;  Service: Radiology;  Laterality: N/A;   RIGHT/LEFT HEART CATH AND CORONARY ANGIOGRAPHY N/A 02/10/2023   Procedure: RIGHT/LEFT HEART CATH AND CORONARY ANGIOGRAPHY;  Surgeon: [place], Peter M, MD;  Location: [facility] INVASIVE CV LAB;  Service: Cardiovascular;  Laterality: N/A;   TONSILLECTOMY     TUBAL LIGATION     Patient Active Problem List   Diagnosis Date Noted   Osteoarthritis of both knees 03/03/2023   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) 02/15/2023   Shock circulatory (HCC) 02/15/2023   Iron deficiency anemia 02/09/2023   Heart failure (HCC) 02/08/2023   Hypokalemia 02/08/2023   Acute on chronic combined systolic and diastolic CHF (congestive heart failure) (HCC) 02/07/2023   Essential hypertension 02/07/2023   Diabetes mellitus type 2, diet-controlled (HCC) 02/07/2023   Elevated troponin 02/07/2023   Obesity, Class  III, BMI 40-49.9 (morbid obesity) (HCC) 02/07/2023    ONSET DATE: 02/15/23  REFERRING DIAG: CVA with L hemi  THERAPY DIAG:  Hemiplegia and hemiparesis following cerebral infarction affecting left dominant side (HCC)  Impaired tolerance of activity  Rationale for Evaluation and Treatment: Rehabilitation  SUBJECTIVE:  SUBJECTIVE STATEMENT: I am alright both of my knees are hurting today. I'm using my cane to manage.     Pt accompanied by: self  PERTINENT HISTORY: h/o CHF hospitalized 5/3 to 02/12/23, returned home and was  Rehospitalized with R thrombotic Middle Cerebral artery, CVA. Hospitalized for 5 days after and sent home.  PAIN:  Are you having pain? Yes: NPRS scale: 3/10 Pain location: R knee  Pain description: throbbing constantly Aggravating factors: weather Relieving factors: heat, rest  PRECAUTIONS: ICD/Pacemaker    WEIGHT BEARING RESTRICTIONS: No  FALLS: Has patient fallen in last 6 months? No  LIVING ENVIRONMENT: Lives with: lives with an adult companion Lives in: House/apartment Stairs: Yes: External: 1 steps; none Has following equipment at home: Walker - 2 wheeled  PLOF: Independent  PATIENT GOALS: I want to be able to walk and stand longer without having to sit down, I want to be able to get out of shower without relying on my 73 y.o mother to help  OBJECTIVE:   DIAGNOSTIC FINDINGS: na  COGNITION: Overall cognitive status: Within functional limits for tasks assessed   SENSATION: WFL  COORDINATION: Toe tapping, marching wfl B  EDEMA: none noted today   MUSCLE TONE: wnl   POSTURE: forward head and weight shift left  LOWER EXTREMITY ROM:   all wfl, UE Rom wfl    LOWER EXTREMITY MMT:    MMT Right Eval Left Eval  Hip flexion    Hip extension    Hip  abduction    Hip adduction    Hip internal rotation    Hip external rotation    Knee flexion    Knee extension 4+ 4+  Ankle dorsiflexion    Ankle plantarflexion    Ankle inversion    Ankle eversion    (Blank rows = not tested)  GAIT: Gait pattern: wide BOS Distance walked: 240' Assistive device utilized: Environmental consultant - 2 wheeled Level of assistance: Modified independence Comments: used walker to arrive to appt, functional tests performed without device  FUNCTIONAL TESTS:  30 seconds chair stand test 10 reps Timed up and go (TUG): 13.84 3 minute walk test: level of exertion 14   TODAY'S TREATMENT:                                                                                                                              DATE:  03/26/23:slightly abbreviated session a pt arrived late, her transportation was late. Nustep level 3, B Ue's and B LE's 7 min Leg ext B 5#, 2 x 10, 10#, 2 x 10 B hamstring curls 20#, 3 x 10 Sit to stand feet on airex 2x 10, hi low table  In ll bars for green t band alt hip abd, alt hip ext, alt SLR 10 x each Gait in clinic, 6 laps, 80' each lap without device, with SBA to tolerance Frequent cues to engage in pursed lip breathing in session.   03/24/23 STS on  airex 2x10  Side steps over obstacles  NuStep L5 x33mins  Leg ext 5# 2x10 HS curls 20# 2x10 Walking 2 laps in gym    03/21/23 Nustep level 5 x 6 minutes HR up to 140 bpm after Nustep Sit 2 minutes HR 105 bpm, she does report a normally fast HR 3 minute walk 500 feet HR up to 138 bpm 2 minute rest down to 93 2# marches, hip abduction and extension 2 x10 each leg Leg curls 20# 2 x10 Leg extenison 5# 2x10 Volley ball with some steps Ball kicks Walking direction changes  03/18/23 NuStep L5 x1mins  STS 2x10  Walking laps in gym 5 laps without walker  Standing marches 2# 20 reps alt  Standing hip abd 2# 2x10  LAQ 2x10 2#  Step ups 6" Walking on beam   03/12/23: no home inst today    PATIENT  EDUCATION: Education details: POC, goals Person educated: Patient Education method: Medical illustrator Education comprehension: verbalized understanding  HOME EXERCISE PROGRAM: na  GOALS: Goals reviewed with patient? Yes  SHORT TERM GOALS: Target date: 03/26/23  I HEP Baseline: to be established Goal status: met 03/21/23   LONG TERM GOALS: Target date: 06/04/23   Improve with 3 min walk test to perceived level of exertion of 9 Baseline: 14 Goal status: INITIAL  2.  Improve 30 sec sit to stand test to 15 Baseline: 10, ( below norm for her age ) Goal status: INITIAL  3.  Improve FGA to 28/30 Baseline: 20 Goal status: INITIAL  ASSESSMENT:  CLINICAL IMPRESSION: Patient is a 52 y.o. female who was seen today for physical therapy treatment for R MCA CVA , along with CHF.  Patient returns with high pain levels in knees. Reports it is limiting her ability to do activity today. We did as much as she could tolerate. Continue to address her deficits and improve her activity tolerance as well as higher level balance activities.    OBJECTIVE IMPAIRMENTS: cardiopulmonary status limiting activity, decreased activity tolerance, decreased balance, decreased endurance, and pain.   ACTIVITY LIMITATIONS: carrying, lifting, bending, stairs, transfers, bathing, and locomotion level  PARTICIPATION LIMITATIONS: meal prep, cleaning, laundry, driving, shopping, and community activity  PERSONAL FACTORS: Fitness, Past/current experiences, Time since onset of injury/illness/exacerbation, and 1-2 comorbidities: pre diabetic, new onset CHF, CVA  are also affecting patient's functional outcome.   REHAB POTENTIAL: Good  CLINICAL DECISION MAKING: Evolving/moderate complexity  EVALUATION COMPLEXITY: Moderate  PLAN:  PT FREQUENCY: 2x/week  PT DURATION: 12 weeks  PLANNED INTERVENTIONS: Therapeutic exercises, Therapeutic activity, Neuromuscular re-education, Balance training, Gait  training, Patient/Family education, Self Care, and Joint mobilization  PLAN FOR NEXT SESSION: continue to gradually increase activity tolerance and endurance   Yael Angerer L Barnell Shieh, PT 03/26/2023, 2:57 PM

## 2023-03-26 NOTE — Telephone Encounter (Signed)
Spoke to patient PharmD's advice given. °

## 2023-03-26 NOTE — Telephone Encounter (Signed)
Noted  

## 2023-03-28 ENCOUNTER — Telehealth: Payer: Self-pay

## 2023-03-28 DIAGNOSIS — H538 Other visual disturbances: Secondary | ICD-10-CM

## 2023-03-28 NOTE — Telephone Encounter (Signed)
Copied from CRM [id number]. Topic: General - Inquiry >> Mar 28, 2023  2:32 PM De Blanch wrote: Reason for CRM: Pt stated she is having difficulty finding an eye doctor that accepts [place] MEDICAID [facility].  Stated has been without her glasses for 3 months now, and her headaches are getting worse. Pt asked for Dr. Henriette Combs opinion and/or a referral, if possible, to someone who accepts her insrance.  Suggested that she reach out to [facility] MEDICAID [facility] directly as well.

## 2023-03-31 ENCOUNTER — Ambulatory Visit: Payer: Medicaid Other

## 2023-03-31 NOTE — Telephone Encounter (Signed)
Called & spoke to [name]. Per Dr.Johnson a video visit will be needed to complete forms. Video visit scheduled for 04/01/2023 at 1:50 p.m. Patient expressed verbal understanding.

## 2023-04-01 ENCOUNTER — Telehealth: Payer: Self-pay | Admitting: Internal Medicine

## 2023-04-01 ENCOUNTER — Telehealth: Payer: Medicaid Other | Admitting: Internal Medicine

## 2023-04-01 DIAGNOSIS — I5042 Chronic combined systolic (congestive) and diastolic (congestive) heart failure: Secondary | ICD-10-CM

## 2023-04-01 DIAGNOSIS — Z029 Encounter for administrative examinations, unspecified: Secondary | ICD-10-CM | POA: Diagnosis not present

## 2023-04-01 DIAGNOSIS — Z8673 Personal history of transient ischemic attack (TIA), and cerebral infarction without residual deficits: Secondary | ICD-10-CM | POA: Diagnosis not present

## 2023-04-01 NOTE — Progress Notes (Signed)
Virtual Visit via Video Note  I connected with Stephanie Reilly on 04/01/2023 at 1:39 PM by a video enabled telemedicine application and verified that I am speaking with the correct person using two identifiers.  Location: Patient: home Provider: Office   I discussed the limitations of evaluation and management by telemedicine and the availability of in person appointments. The patient expressed understanding and agreed to proceed.  History of Present Illness: Patient with history of HTN, HL, preDM, combined CHF with EF less than 20%, embolic CVA,  morbid obesity, IDA, anx/dep.  Daughter [name] was suppose to join [place] as this was a forms visit to clarify what she needs in terms of FMLA.  pt states daughter is at work in a meeting and has given her the info that I need.  I have completed FMLA form for her in May.  Since then she had called back requesting some revisions.  She then sent in a new FMLA form from [place] from [place].   On previous visit, daughter had requested to be off for 3 to 4 days a month for a full 8-hour day.  She is now requesting 5 to 6 days a month for 8 hours.  Patient does not drive.  She relies on her daughter to take her to appointments including cardiology and neurology.  She is also doing physical therapy 2 times a week.  Daughter runs errands and help her around the house.  Patient states that she has to sit in a chair to cook.  She can bathe herself but needs help and washing her back and then getting in and out of the tub.  HM: Reports having shingles vaccine recently at [facility].   Outpatient Encounter Medications as of 04/01/2023  Medication Sig   apixaban (ELIQUIS) 5 MG TABS tablet Take 1 tablet (5 mg total) by mouth 2 (two) times daily.   Ascorbic Acid (VITAMIN C) 100 MG tablet Take 100 mg by mouth daily.   Biotin (BIOTIN MAXIMUM STRENGTH) 10 MG TABS Take 10 mg by mouth daily.   carvedilol (COREG) 6.25 MG tablet Take 1 tablet (6.25 mg total) by mouth 2  (two) times daily with a meal.   cetirizine (ZYRTEC) 10 MG tablet Take 10 mg by mouth daily.   dapagliflozin propanediol (FARXIGA) 10 MG TABS tablet Take 1 tablet (10 mg total) by mouth daily.   digoxin (LANOXIN) 0.125 MG tablet Take 1 tablet (0.125 mg total) by mouth daily.   Multiple Vitamins-Minerals (HAIR SKIN & NAILS PO) Take 2 tablets by mouth daily. (Patient not taking: Reported on 03/14/2023)   polyethylene glycol (MIRALAX / GLYCOLAX) 17 g packet Take 17 g by mouth daily as needed.   rosuvastatin (CRESTOR) 20 MG tablet Take 1 tablet (20 mg total) by mouth daily.   sacubitril-valsartan (ENTRESTO) 49-51 MG Take 1 tablet by mouth 2 (two) times daily.   senna-docusate (SENOKOT-S) 8.6-50 MG tablet Take 1 tablet by mouth at bedtime as needed for mild constipation.   spironolactone (ALDACTONE) 25 MG tablet Take 1 tablet (25 mg total) by mouth daily.   torsemide (DEMADEX) 20 MG tablet Take 1 tablet (20 mg total) by mouth daily as needed (for increased edema).   triamcinolone cream (KENALOG) 0.1 % Apply 1 Application topically 2 (two) times daily.   No facility-administered encounter medications on file as of 04/01/2023.      Observations/Objective: Middle-age African-American female in NAD  Assessment and Plan: 1. Administrative encounter 2. History of CVA (cerebrovascular accident) without  residual deficits 3. Chronic combined systolic and diastolic CHF, NYHA class 2 (HCC) -FMLA form will be completed.  I will call her when ready to pick up.   Follow Up Instructions: As previously scheduled.   I discussed the assessment and treatment plan with the patient. The patient was provided an opportunity to ask questions and all were answered. The patient agreed with the plan and demonstrated an understanding of the instructions.   The patient was advised to call back or seek an in-person evaluation if the symptoms worsen or if the condition fails to improve as anticipated.  I spent 10 minutes  minutes dedicated to the care of this patient on the date of this encounter to include previsit review of previous FMLA form and current FMLA form, face-to-face time with patient.  This note has been created with Education officer, environmental. Any transcriptional errors are unintentional.  Jonah Blue, MD

## 2023-04-01 NOTE — Telephone Encounter (Signed)
Copied from CRM [id number]. Topic: General - Other >> Apr 01, 2023  4:02 PM Turkey B wrote: Reason for CRM: pt's mother called in checking on if FMLA  paperwork is ready for her daugher,  to pick up. She wants to pick it up tomorrow

## 2023-04-02 ENCOUNTER — Ambulatory Visit: Payer: Medicaid Other

## 2023-04-02 ENCOUNTER — Other Ambulatory Visit: Payer: Self-pay

## 2023-04-02 ENCOUNTER — Telehealth: Payer: Self-pay | Admitting: Internal Medicine

## 2023-04-02 DIAGNOSIS — R6889 Other general symptoms and signs: Secondary | ICD-10-CM

## 2023-04-02 DIAGNOSIS — I69352 Hemiplegia and hemiparesis following cerebral infarction affecting left dominant side: Secondary | ICD-10-CM | POA: Diagnosis not present

## 2023-04-02 DIAGNOSIS — Z7689 Persons encountering health services in other specified circumstances: Secondary | ICD-10-CM | POA: Diagnosis not present

## 2023-04-02 NOTE — Addendum Note (Signed)
Addended by: Jonah Blue B on: 04/02/2023 10:33 AM   Modules accepted: Orders

## 2023-04-02 NOTE — Telephone Encounter (Signed)
Copied from CRM [id number]. Topic: General - Other >> Apr 02, 2023  2:35 PM Santiya F wrote: Reason for CRM: Pt is calling in returning a call from the office. Pt wants to know was the call regarding her paperwork. Please follow up with pt.

## 2023-04-02 NOTE — Therapy (Signed)
OUTPATIENT PHYSICAL THERAPY NEURO TREATMENT   Patient Name: Stephanie Reilly MRN: [id number] DOB:October 22, 1970, 52 y.o., female Today's Date: 04/02/2023   PCP: Jonah Blue, MD REFERRING PROVIDER: Brynda Peon, NP  END OF SESSION:  PT End of Session - 04/02/23 1354     Visit Number 6    Date for PT Re-Evaluation 06/04/23    Progress Note Due on Visit 10    PT Start Time 1345    PT Stop Time 1430    PT Time Calculation (min) 45 min    Activity Tolerance Patient limited by fatigue    Behavior During Therapy [facility] for tasks assessed/performed                Past Medical History:  Diagnosis Date   Anemia    Anxiety    Arthritis    Depression    Hypertension    Prediabetes    Past Surgical History:  Procedure Laterality Date   ABDOMINAL HYSTERECTOMY     IR CT HEAD LTD  02/15/2023   IR PERCUTANEOUS ART THROMBECTOMY/INFUSION INTRACRANIAL INC DIAG ANGIO  02/15/2023   IR US GUIDE VASC ACCESS RIGHT  02/15/2023   RADIOLOGY WITH ANESTHESIA N/A 02/15/2023   Procedure: IR WITH ANESTHESIA;  Surgeon: Radiologist, Medication, MD;  Location: [facility] OR;  Service: Radiology;  Laterality: N/A;   RIGHT/LEFT HEART CATH AND CORONARY ANGIOGRAPHY N/A 02/10/2023   Procedure: RIGHT/LEFT HEART CATH AND CORONARY ANGIOGRAPHY;  Surgeon: [place], Peter M, MD;  Location: [facility] INVASIVE CV LAB;  Service: Cardiovascular;  Laterality: N/A;   TONSILLECTOMY     TUBAL LIGATION     Patient Active Problem List   Diagnosis Date Noted   Osteoarthritis of both knees 03/03/2023   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) 02/15/2023   Shock circulatory (HCC) 02/15/2023   Iron deficiency anemia 02/09/2023   Heart failure (HCC) 02/08/2023   Hypokalemia 02/08/2023   Acute on chronic combined systolic and diastolic CHF (congestive heart failure) (HCC) 02/07/2023   Essential hypertension 02/07/2023   Diabetes mellitus type 2, diet-controlled (HCC) 02/07/2023   Elevated troponin 02/07/2023   Obesity, Class  III, BMI 40-49.9 (morbid obesity) (HCC) 02/07/2023    ONSET DATE: 02/15/23  REFERRING DIAG: CVA with L hemi  THERAPY DIAG:  Hemiplegia and hemiparesis following cerebral infarction affecting left dominant side (HCC)  Impaired tolerance of activity  Rationale for Evaluation and Treatment: Rehabilitation  SUBJECTIVE:  SUBJECTIVE STATEMENT: I am good, was able to make it to the mailbox and back yesterday, which is the first time since I was in the hospital    Pt accompanied by: self  PERTINENT HISTORY: h/o CHF hospitalized 5/3 to 02/12/23, returned home and was  Rehospitalized with R thrombotic Middle Cerebral artery, CVA. Hospitalized for 5 days after and sent home.  PAIN:  Are you having pain? Yes: NPRS scale: 3/10 Pain location: R knee  Pain description: throbbing constantly Aggravating factors: weather Relieving factors: heat, rest  PRECAUTIONS: ICD/Pacemaker    WEIGHT BEARING RESTRICTIONS: No  FALLS: Has patient fallen in last 6 months? No  LIVING ENVIRONMENT: Lives with: lives with an adult companion Lives in: House/apartment Stairs: Yes: External: 1 steps; none Has following equipment at home: Walker - 2 wheeled  PLOF: Independent  PATIENT GOALS: I want to be able to walk and stand longer without having to sit down, I want to be able to get out of shower without relying on my 32 y.o mother to help  OBJECTIVE:   DIAGNOSTIC FINDINGS: na  COGNITION: Overall cognitive status: Within functional limits for tasks assessed   SENSATION: WFL  COORDINATION: Toe tapping, marching wfl B  EDEMA: none noted today   MUSCLE TONE: wnl   POSTURE: forward head and weight shift left  LOWER EXTREMITY ROM:   all wfl, UE Rom wfl    LOWER EXTREMITY MMT:    MMT Right Eval Left Eval   Hip flexion    Hip extension    Hip abduction    Hip adduction    Hip internal rotation    Hip external rotation    Knee flexion    Knee extension 4+ 4+  Ankle dorsiflexion    Ankle plantarflexion    Ankle inversion    Ankle eversion    (Blank rows = not tested)  GAIT: Gait pattern: wide BOS Distance walked: 240' Assistive device utilized: Environmental consultant - 2 wheeled Level of assistance: Modified independence Comments: used walker to arrive to appt, functional tests performed without device  FUNCTIONAL TESTS:  30 seconds chair stand test 10 reps Timed up and go (TUG): 13.84 3 minute walk test: level of exertion 14   TODAY'S TREATMENT:                                                                                                                              DATE:  04/02/23: Gait x 450' indoors, no device Seated long arc quads 5# cuff wt, alternating, 10 x 2 Recumbent bike level 2, 5 min In ll bars for red t band alt hip abd, alt hip ext, mass practice In ll bars heel toe rocks Sit to stand from standard chair airex under feet 10 x 2  Heel/toe gait on airex balance beam 6 laps ll bars   03/26/23:slightly abbreviated session a pt arrived late, her transportation was late. Nustep level 3, B Ue's and B LE's 7  min Leg ext B 5#, 2 x 10, 10#, 2 x 10 B hamstring curls 20#, 3 x 10 Sit to stand feet on airex 2x 10, hi low table  In ll bars for green t band alt hip abd, alt hip ext, alt SLR 10 x each Gait in clinic, 6 laps, 80' each lap without device, with SBA to tolerance Frequent cues to engage in pursed lip breathing in session.   03/24/23 STS on airex 2x10  Side steps over obstacles  NuStep L5 x51mins  Leg ext 5# 2x10 HS curls 20# 2x10 Walking 2 laps in gym    03/21/23 Nustep level 5 x 6 minutes HR up to 140 bpm after Nustep Sit 2 minutes HR 105 bpm, she does report a normally fast HR 3 minute walk 500 feet HR up to 138 bpm 2 minute rest down to 93 2# marches, hip abduction  and extension 2 x10 each leg Leg curls 20# 2 x10 Leg extenison 5# 2x10 Volley ball with some steps Ball kicks Walking direction changes  03/18/23 NuStep L5 x32mins  STS 2x10  Walking laps in gym 5 laps without walker  Standing marches 2# 20 reps alt  Standing hip abd 2# 2x10  LAQ 2x10 2#  Step ups 6" Walking on beam   03/12/23: no home inst today    PATIENT EDUCATION: Education details: POC, goals Person educated: Patient Education method: Medical illustrator Education comprehension: verbalized understanding  HOME EXERCISE PROGRAM: na  GOALS: Goals reviewed with patient? Yes  SHORT TERM GOALS: Target date: 03/26/23  I HEP Baseline: to be established Goal status: met 03/21/23   LONG TERM GOALS: Target date: 06/04/23   Improve with 3 min walk test to perceived level of exertion of 9 Baseline: 14 Goal status: INITIAL  2.  Improve 30 sec sit to stand test to 15 Baseline: 10, ( below norm for her age ) Goal status: INITIAL  3.  Improve FGA to 28/30 Baseline: 20 Goal status: INITIAL  ASSESSMENT:  CLINICAL IMPRESSION: Patient is a 52 y.o. female who was seen today for physical therapy treatment for R MCA CVA , along with CHF.  Progressed with cardiovascular/endurance challenges.tolerated well. Continue to address her deficits and improve her activity tolerance as well as higher level balance activities.    OBJECTIVE IMPAIRMENTS: cardiopulmonary status limiting activity, decreased activity tolerance, decreased balance, decreased endurance, and pain.   ACTIVITY LIMITATIONS: carrying, lifting, bending, stairs, transfers, bathing, and locomotion level  PARTICIPATION LIMITATIONS: meal prep, cleaning, laundry, driving, shopping, and community activity  PERSONAL FACTORS: Fitness, Past/current experiences, Time since onset of injury/illness/exacerbation, and 1-2 comorbidities: pre diabetic, new onset CHF, CVA  are also affecting patient's functional outcome.    REHAB POTENTIAL: Good  CLINICAL DECISION MAKING: Evolving/moderate complexity  EVALUATION COMPLEXITY: Moderate  PLAN:  PT FREQUENCY: 2x/week  PT DURATION: 12 weeks  PLANNED INTERVENTIONS: Therapeutic exercises, Therapeutic activity, Neuromuscular re-education, Balance training, Gait training, Patient/Family education, Self Care, and Joint mobilization  PLAN FOR NEXT SESSION: continue to gradually increase activity tolerance and endurance   Kolette Vey L Felica Chargois, PT 04/02/2023, 2:30 PM

## 2023-04-03 NOTE — Telephone Encounter (Signed)
Called & spoke to the patient and informed that forms are ready for pick-up. Contacted the patient's daughter, Lorelee Market, to inform her that forms are ready as well.

## 2023-04-03 NOTE — Telephone Encounter (Signed)
Called & spoke to the patient. Verified name & DOB. Informed that referral has been sent. Patient stated that she recently spoke to Dr.Johnson at the video visit and was able to have referral sent to the specialist of her preference and has an appointment scheduled for 04/05/2023. No further questions at this time.

## 2023-04-03 NOTE — Telephone Encounter (Signed)
Pt called to share the correct fax number  Fax: [phone]   This is the baseline form for the weight watchers program she wants to be in

## 2023-04-03 NOTE — Telephone Encounter (Signed)
Forms successfully faxed to [phone] and fax [phone] on 04/03/2023.

## 2023-04-04 ENCOUNTER — Other Ambulatory Visit: Payer: Self-pay | Admitting: *Deleted

## 2023-04-04 ENCOUNTER — Encounter: Payer: Self-pay | Admitting: *Deleted

## 2023-04-05 DIAGNOSIS — Z7689 Persons encountering health services in other specified circumstances: Secondary | ICD-10-CM | POA: Diagnosis not present

## 2023-04-05 LAB — HM DIABETES EYE EXAM

## 2023-04-07 DIAGNOSIS — Z419 Encounter for procedure for purposes other than remedying health state, unspecified: Secondary | ICD-10-CM | POA: Diagnosis not present

## 2023-04-08 ENCOUNTER — Telehealth: Payer: Self-pay

## 2023-04-08 DIAGNOSIS — I509 Heart failure, unspecified: Secondary | ICD-10-CM | POA: Diagnosis not present

## 2023-04-08 NOTE — Telephone Encounter (Signed)
Copied from CRM [id number]. Topic: General - Inquiry >> Apr 08, 2023 10:57 AM Patsy Lager T wrote: Reason for CRM: patient called to let someone know she faxed over paperwork from [facility] letting the provider know everything is well with her eye exam.

## 2023-04-09 DIAGNOSIS — I509 Heart failure, unspecified: Secondary | ICD-10-CM | POA: Diagnosis not present

## 2023-04-11 ENCOUNTER — Ambulatory Visit (HOSPITAL_COMMUNITY)
Admission: RE | Admit: 2023-04-11 | Discharge: 2023-04-11 | Disposition: A | Discharge status: Home / Self Care | Payer: Medicaid Other | Source: Ambulatory Visit | Attending: Family Medicine | Admitting: Family Medicine

## 2023-04-11 ENCOUNTER — Encounter (HOSPITAL_COMMUNITY): Payer: Self-pay

## 2023-04-11 VITALS — BP 114/86 | HR 104 | Wt 245.8 lb

## 2023-04-11 DIAGNOSIS — I639 Cerebral infarction, unspecified: Secondary | ICD-10-CM

## 2023-04-11 DIAGNOSIS — I428 Other cardiomyopathies: Secondary | ICD-10-CM | POA: Diagnosis not present

## 2023-04-11 DIAGNOSIS — I5022 Chronic systolic (congestive) heart failure: Secondary | ICD-10-CM | POA: Diagnosis not present

## 2023-04-11 DIAGNOSIS — E785 Hyperlipidemia, unspecified: Secondary | ICD-10-CM | POA: Insufficient documentation

## 2023-04-11 DIAGNOSIS — Z7689 Persons encountering health services in other specified circumstances: Secondary | ICD-10-CM | POA: Diagnosis not present

## 2023-04-11 DIAGNOSIS — Z8249 Family history of ischemic heart disease and other diseases of the circulatory system: Secondary | ICD-10-CM | POA: Insufficient documentation

## 2023-04-11 DIAGNOSIS — I472 Ventricular tachycardia, unspecified: Secondary | ICD-10-CM | POA: Diagnosis not present

## 2023-04-11 DIAGNOSIS — E669 Obesity, unspecified: Secondary | ICD-10-CM | POA: Insufficient documentation

## 2023-04-11 DIAGNOSIS — Z7901 Long term (current) use of anticoagulants: Secondary | ICD-10-CM | POA: Diagnosis not present

## 2023-04-11 DIAGNOSIS — I4729 Other ventricular tachycardia: Secondary | ICD-10-CM

## 2023-04-11 DIAGNOSIS — E119 Type 2 diabetes mellitus without complications: Secondary | ICD-10-CM | POA: Insufficient documentation

## 2023-04-11 DIAGNOSIS — Z79899 Other long term (current) drug therapy: Secondary | ICD-10-CM | POA: Insufficient documentation

## 2023-04-11 DIAGNOSIS — I110 Hypertensive heart disease with heart failure: Secondary | ICD-10-CM | POA: Insufficient documentation

## 2023-04-11 DIAGNOSIS — Z8679 Personal history of other diseases of the circulatory system: Secondary | ICD-10-CM | POA: Insufficient documentation

## 2023-04-11 DIAGNOSIS — I493 Ventricular premature depolarization: Secondary | ICD-10-CM | POA: Insufficient documentation

## 2023-04-11 LAB — BRAIN NATRIURETIC PEPTIDE: B Natriuretic Peptide: 158.8 pg/mL — ABNORMAL HIGH (ref 0.0–100.0)

## 2023-04-11 LAB — BASIC METABOLIC PANEL
Anion gap: 8 (ref 5–15)
BUN: 14 mg/dL (ref 6–20)
CO2: 26 mmol/L (ref 22–32)
Calcium: 10.1 mg/dL (ref 8.9–10.3)
Chloride: 104 mmol/L (ref 98–111)
Creatinine, Ser: 0.85 mg/dL (ref 0.44–1.00)
GFR, Estimated: >60 mL/min (ref >60)
Glucose, Bld: 106 mg/dL — ABNORMAL HIGH (ref 70–99)
Potassium: 4.3 mmol/L (ref 3.5–5.1)
Sodium: 138 mmol/L (ref 135–145)

## 2023-04-11 LAB — DIGOXIN LEVEL: Digoxin Level: 0.9 ng/mL (ref 0.8–2.0)

## 2023-04-11 MED ORDER — CARVEDILOL 6.25 MG PO TABS: 9.3750 mg | ORAL_TABLET | Freq: Two times a day (BID) | ORAL | 6 refills | Status: DC

## 2023-04-11 NOTE — Progress Notes (Signed)
ADVANCED HF CLINIC NOTE   Primary Care: Marcine Matar, MD Primary Cardiologist: Rollene Rotunda, MD HF Cardiologist: Dr. Gasper Lloyd  HPI: Stephanie Reilly is a 52 y.o. female with hypertension, hyperlipidemia, type 2 diabetes, obesity and newly diagnosed systolic heart failure.   Admitted 5/24 with new acute systolic heart failure. Echo showed EF of 20% with GHK ,but preserved RV function. General Cardiology consulted, [facility] showed normal cors, mildly elevated filling pressures, mild pulmonary HTN, preserved cardiac index.  EP consulted due to NSVT/PVCs, planned for GDMT titration and deferred LifeVest. 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 GDMT titrated and she was discharged home, weight 246 lbs.  Admitted 5/24 with acute CVA. Imaging showed occlusion of the proximal right M2.  Sent to IR and intubated.  Immediately postintubation she had PEA arrest, likely 2/2 to induction for intubation. Underwent diagnostic cerebral angiogram and thrombectomy for the right M2 occlusion now status post complete recanalization. AHF consulted. Hospitalization complicated by NSVT/PVCs, LifeVest placed at discharge, weight 245 lbs.  Today she returns for HF follow up. Overall feeling great. She has SOB when she works out with PT but otherwise no dyspnea.  Denies palpitations, abnormal bleeding, CP, dizziness, edema, or PND/Orthopnea. Appetite ok. No fever or chills. Weight at home 238 pounds. Taking all medications. No ETOH/tobacco use.  Cardiac Studies  - Echo (02/16/23): EF < 20%, RV  mildly reduced, mild mR  - cMRI (5/24): LVEF 12%, RVEF 31%, mid inferior wall LGE ? Sarcoid  - R/LHC (5/24): normal cors, RA 6, PA 42/22 (30), PCWP mean 20, CO/CI (Fick) 4.81/2.26  - Echo (02/07/23): EF < 20%, grade III DD, RV mildly decreased   Past Medical History:  Diagnosis Date   Anemia    Anxiety    Arthritis    Depression    Hypertension    Prediabetes    Current Outpatient  Medications  Medication Sig Dispense Refill   apixaban (ELIQUIS) 5 MG TABS tablet Take 1 tablet (5 mg total) by mouth 2 (two) times daily. 60 tablet 2   Ascorbic Acid (VITAMIN C) 100 MG tablet Take 100 mg by mouth daily.     Biotin (BIOTIN MAXIMUM STRENGTH) 10 MG TABS Take 10 mg by mouth daily.     carvedilol (COREG) 6.25 MG tablet Take 1 tablet (6.25 mg total) by mouth 2 (two) times daily with a meal. 60 tablet 11   cetirizine (ZYRTEC) 10 MG tablet Take 10 mg by mouth daily.     dapagliflozin propanediol (FARXIGA) 10 MG TABS tablet Take 1 tablet (10 mg total) by mouth daily. 90 tablet 1   digoxin (LANOXIN) 0.125 MG tablet Take 1 tablet (0.125 mg total) by mouth daily. 90 tablet 1   polyethylene glycol (MIRALAX / GLYCOLAX) 17 g packet Take 17 g by mouth daily as needed. 30 packet 3   rosuvastatin (CRESTOR) 20 MG tablet Take 1 tablet (20 mg total) by mouth daily. 90 tablet 1   sacubitril-valsartan (ENTRESTO) 49-51 MG Take 1 tablet by mouth 2 (two) times daily. 180 tablet 3   senna-docusate (SENOKOT-S) 8.6-50 MG tablet Take 1 tablet by mouth at bedtime as needed for mild constipation. 30 tablet 0   spironolactone (ALDACTONE) 25 MG tablet Take 1 tablet (25 mg total) by mouth daily. 90 tablet 1   torsemide (DEMADEX) 20 MG tablet Take 1 tablet (20 mg total) by mouth daily as needed (for increased edema). 30 tablet 5   No current facility-administered medications for this  encounter.   Allergies  Allergen Reactions   Paroxetine Hcl Anaphylaxis   Prednisone Shortness Of Breath and Other (See Comments)    Confusion, "messes with mental," muscles feel weird, fatigue, insomnia   Chocolate Other (See Comments)    Unknown    Ibuprofen Other (See Comments)    Unknown    Iron Other (See Comments)    GI upset (reported 08/04/2017). Pt clarifies had constipation, nausea.  Stool softeners did not help.    Latex Other (See Comments)    Unknown    Lisinopril Hives   Naproxen Other (See Comments)     Unknown    Tramadol Hives   Codeine Rash   Penicillins Rash   Serotonin Nausea Only, Swelling, Anxiety, Rash and Other (See Comments)    GI intolerence   Social History   Socioeconomic History   Marital status: Single    Spouse name: Not on file   Number of children: 3   Years of education: Not on file   Highest education level: Bachelor's degree (e.g., BA, AB, BS)  Occupational History   Occupation: self employed  Tobacco Use   Smoking status: Former    Packs/day: 0.50    Years: 11.00    Additional pack years: 0.00    Total pack years: 5.50    Types: Cigarettes    Quit date: 10/2021    Years since quitting: 1.5    Passive exposure: Past   Smokeless tobacco: Never  Vaping Use   Vaping Use: Never used  Substance and Sexual Activity   Alcohol use: Never   Drug use: Yes    Types: Marijuana    Comment: routine use of CBD edibles   Sexual activity: Not on file  Other Topics Concern   Not on file  Social History Narrative   Not on file   Social Determinants of Health   Financial Resource Strain: Low Risk  (02/20/2023)   Overall Financial Resource Strain (CARDIA)    Difficulty of Paying Living Expenses: Not hard at all  Food Insecurity: No Food Insecurity (02/20/2023)   Hunger Vital Sign    Worried About Running Out of Food in the Last Year: Never true    Ran Out of Food in the Last Year: Never true  Transportation Needs: No Transportation Needs (02/20/2023)   PRAPARE - Administrator, Civil Service (Medical): No    Lack of Transportation (Non-Medical): No  Physical Activity: Insufficiently Active (02/20/2023)   Exercise Vital Sign    Days of Exercise per Week: 4 days    Minutes of Exercise per Session: 20 min  Stress: No Stress Concern Present (02/20/2023)   [employer] of Occupational Health - Occupational Stress Questionnaire    Feeling of Stress : Only a little  Social Connections: Moderately Isolated (02/20/2023)   Social Connection and Isolation  Panel [NHANES]    Frequency of Communication with Friends and Family: More than three times a week    Frequency of Social Gatherings with Friends and Family: Once a week    Attends Religious Services: More than 4 times per year    Active Member of [employer] or Organizations: No    Attends Banker Meetings: Not on file    Marital Status: Never married  Intimate Partner Violence: Not At Risk (02/15/2023)   Humiliation, Afraid, Rape, and Kick questionnaire    Fear of Current or Ex-Partner: No    Emotionally Abused: No    Physically Abused: No  Sexually Abused: No   Family History  Problem Relation Age of Onset   Hypertension Mother    Heart failure Mother    COPD Mother    Diabetes Paternal Grandfather    Wt Readings from Last 3 Encounters:  04/11/23 111.5 kg (245 lb 12.8 oz)  03/22/23 112.7 kg (248 lb 6.4 oz)  03/14/23 112.7 kg (248 lb 6.4 oz)   BP 114/86   Pulse (!) 104   Wt 111.5 kg (245 lb 12.8 oz)   SpO2 97%   BMI 44.96 kg/m   PHYSICAL EXAM: General:  NAD. No resp difficulty, walked into clinic HEENT: Normal Neck: Supple. No JVD. Carotids 2+ bilat; no bruits. No lymphadenopathy or thryomegaly appreciated. Cor: PMI nondisplaced. Tachy rate & rhythm. No rubs, gallops or murmurs. Lungs: Clear, LifeVest on Abdomen: Soft, obese, nontender, nondistended. No hepatosplenomegaly. No bruits or masses. Good bowel sounds. Extremities: No cyanosis, clubbing, rash, edema Neuro: Alert & oriented x 3, cranial nerves grossly intact. Moves all 4 extremities w/o difficulty. Affect pleasant.  LifeVest interrogation (personally reviewed): no treatments, average HR 95 bpm, 2639 average daily steps  ReDs: 42%  ASSESSMENT & PLAN: Chronic systolic heart failure - Newly diagnosed HFrEF 5/24. NICM - s/p PEA arrest, likely after induction for intubation.   - Echo (5/24): preserved RV function, LVEF 15%.  - L/RHC (5/24): with normal cors, RA mean 6, PA 42/22 (30), PCWP 20, CO/CI  (Fick) 4.81/2.26 - cMRI (5/24): LVEF 12%, RV moderate dysfunction RVEF 31%. Subendocardial LGE - ? Small infarct possible sarcoid - Cardiac PET has been ordered will try to schedule today, of note blood glucose has been better controlled per patient report. - Improved NYHA II, Volume status stable on exam. ReDs elevated at 42%, discordant with exam findings (? Body habitus contributory) - Increase Coreg to 9.375 mg bid - Continue torsemide 20 mg PRN. - Continue Entresto 49/51 mg bid  - Continue spiro 25 mg daily  - Continue Farxiga 10 mg daily  - Continue digoxin 0.125 mcg. - Continue LifeVest  - BMET, BNP and dig level today - Repeat echo next month. If EF remains < 35% refer to EP for ICD.   2. Right M2 stroke - s/p mechanical thrombectomy with small SAH - Suspect cardio embolic  - On Eliquis + statin.  - No residual deficits.  - No bleeding issues.   3. DMII - Hgb A1C 6.1  - On Farxiga  - Referred to PharmD for GLP1    4. NSVT /PVCs  - EP saw during recent admit, PVC burden not high enough to warrant amiodarone. - Continue Lifevest - Labs today.  Follow up next month with Dr. Gasper Lloyd + echo.  Prince Rome, FNP-BC 04/11/23  8:51 AM   Advanced [facility] Failure [facility] [address] [facility] [place] [address] [phone] (office) [phone] (fax)

## 2023-04-11 NOTE — Patient Instructions (Addendum)
Thank you for coming in today  If you had labs drawn today, any labs that are abnormal the clinic will call you No news is good news  You will be called and scheduled for the cardiac Pet scan please be sure to answer and schedule this appointment   Medications: INCREASE Coreg to 9.375 mg 1 1/2 tablet twice daily Follow up appointments:  Your physician recommends that you schedule a follow-up appointment in:  Keep follow up with With Dr. Gasper Lloyd    Do the following things EVERYDAY: Weigh yourself in the morning before breakfast. Write it down and keep it in a log. Take your medicines as prescribed Eat low salt foods--Limit salt (sodium) to 2000 mg per day.  Stay as active as you can everyday Limit all fluids for the day to less than 2 liters   At the [facility], you and your health needs are our priority. As part of our continuing mission to provide you with exceptional heart care, we have created designated Provider Care Teams. These Care Teams include your primary Cardiologist (physician) and Advanced Practice Providers (APPs- Physician Assistants and Nurse Practitioners) who all work together to provide you with the care you need, when you need it.   You may see any of the following providers on your designated Care Team at your next follow up: Dr Arvilla Meres Dr Marca Ancona Dr. Marcos Eke, NP Robbie Lis, [place] [facility] [place], [place] Brynda Peon, NP Karle Plumber, PharmD   Please be sure to bring in all your medications bottles to every appointment.    Thank you for choosing [facility]  If you have any questions or concerns before your next appointment please send [place] a message through [employer] or call our office at [phone].    TO LEAVE A MESSAGE FOR THE NURSE SELECT OPTION 2, PLEASE LEAVE A MESSAGE INCLUDING: YOUR NAME DATE OF BIRTH CALL BACK NUMBER REASON FOR  CALL**this is important as we prioritize the call backs  YOU WILL RECEIVE A CALL BACK THE SAME DAY AS LONG AS YOU CALL BEFORE 4:00 PM

## 2023-04-11 NOTE — Progress Notes (Signed)
ReDS Vest / Clip - 04/11/23 0800       ReDS Vest / Clip   Station Marker B    Ruler Value 31.5    ReDS Value Range High volume overload    ReDS Actual Value 42

## 2023-04-14 ENCOUNTER — Encounter: Payer: Self-pay | Admitting: Physical Therapy

## 2023-04-14 ENCOUNTER — Other Ambulatory Visit (HOSPITAL_COMMUNITY)
Admission: RE | Admit: 2023-04-14 | Discharge: 2023-04-14 | Disposition: A | Discharge status: Home / Self Care | Payer: Medicaid Other | Source: Ambulatory Visit

## 2023-04-14 ENCOUNTER — Ambulatory Visit: Payer: Medicaid Other | Attending: Internal Medicine | Admitting: Internal Medicine

## 2023-04-14 ENCOUNTER — Encounter: Payer: Self-pay | Admitting: Internal Medicine

## 2023-04-14 ENCOUNTER — Ambulatory Visit: Payer: Medicaid Other | Attending: Internal Medicine | Admitting: Physical Therapy

## 2023-04-14 VITALS — BP 109/79 | HR 105 | Temp 98.6°F | Ht 62.0 in | Wt 245.0 lb

## 2023-04-14 DIAGNOSIS — I509 Heart failure, unspecified: Secondary | ICD-10-CM | POA: Diagnosis not present

## 2023-04-14 DIAGNOSIS — N898 Other specified noninflammatory disorders of vagina: Secondary | ICD-10-CM | POA: Insufficient documentation

## 2023-04-14 DIAGNOSIS — Z1211 Encounter for screening for malignant neoplasm of colon: Secondary | ICD-10-CM | POA: Diagnosis not present

## 2023-04-14 DIAGNOSIS — R6889 Other general symptoms and signs: Secondary | ICD-10-CM | POA: Insufficient documentation

## 2023-04-14 DIAGNOSIS — Z7689 Persons encountering health services in other specified circumstances: Secondary | ICD-10-CM | POA: Diagnosis not present

## 2023-04-14 DIAGNOSIS — I69352 Hemiplegia and hemiparesis following cerebral infarction affecting left dominant side: Secondary | ICD-10-CM | POA: Insufficient documentation

## 2023-04-14 MED ORDER — FLUCONAZOLE 150 MG PO TABS
150.0000 mg | ORAL_TABLET | Freq: Every day | ORAL | 0 refills | Status: DC
Start: 2023-04-14 — End: 2023-04-23

## 2023-04-14 NOTE — Therapy (Signed)
OUTPATIENT PHYSICAL THERAPY NEURO TREATMENT   Patient Name: Stephanie Reilly MRN: [id number] DOB:1970/10/11, 52 y.o., female Today's Date: 04/14/2023   PCP: Jonah Blue, MD REFERRING PROVIDER: Brynda Peon, NP  END OF SESSION:  PT End of Session - 04/14/23 1329     Visit Number 7    Date for PT Re-Evaluation 06/04/23    PT Start Time 1330    PT Stop Time 1415    PT Time Calculation (min) 45 min    Activity Tolerance Patient limited by fatigue    Behavior During Therapy [facility] for tasks assessed/performed                Past Medical History:  Diagnosis Date   Anemia    Anxiety    Arthritis    Depression    Hypertension    Prediabetes    Past Surgical History:  Procedure Laterality Date   ABDOMINAL HYSTERECTOMY     IR CT HEAD LTD  02/15/2023   IR PERCUTANEOUS ART THROMBECTOMY/INFUSION INTRACRANIAL INC DIAG ANGIO  02/15/2023   IR US GUIDE VASC ACCESS RIGHT  02/15/2023   RADIOLOGY WITH ANESTHESIA N/A 02/15/2023   Procedure: IR WITH ANESTHESIA;  Surgeon: Radiologist, Medication, MD;  Location: [facility] OR;  Service: Radiology;  Laterality: N/A;   RIGHT/LEFT HEART CATH AND CORONARY ANGIOGRAPHY N/A 02/10/2023   Procedure: RIGHT/LEFT HEART CATH AND CORONARY ANGIOGRAPHY;  Surgeon: [place], Peter M, MD;  Location: [facility] INVASIVE CV LAB;  Service: Cardiovascular;  Laterality: N/A;   TONSILLECTOMY     TUBAL LIGATION     Patient Active Problem List   Diagnosis Date Noted   Osteoarthritis of both knees 03/03/2023   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) 02/15/2023   Shock circulatory (HCC) 02/15/2023   Iron deficiency anemia 02/09/2023   Heart failure (HCC) 02/08/2023   Hypokalemia 02/08/2023   Acute on chronic combined systolic and diastolic CHF (congestive heart failure) (HCC) 02/07/2023   Essential hypertension 02/07/2023   Diabetes mellitus type 2, diet-controlled (HCC) 02/07/2023   Elevated troponin 02/07/2023   Obesity, Class III, BMI 40-49.9 (morbid obesity)  (HCC) 02/07/2023    ONSET DATE: 02/15/23  REFERRING DIAG: CVA with L hemi  THERAPY DIAG:  Hemiplegia and hemiparesis following cerebral infarction affecting left dominant side (HCC)  Impaired tolerance of activity  Rationale for Evaluation and Treatment: Rehabilitation  SUBJECTIVE:                                                                                                                                                                                             SUBJECTIVE STATEMENT: "So, so" hard to deal  with the osteoarthritis, depression, and anxiety. Physically knees are killing her and the back hurt. Could be due to the weather  Pt accompanied by: self  PERTINENT HISTORY: h/o CHF hospitalized 5/3 to 02/12/23, returned home and was  Rehospitalized with R thrombotic Middle Cerebral artery, CVA. Hospitalized for 5 days after and sent home.  PAIN:  Are you having pain? Yes: NPRS scale: 7/10 Pain location: R knee  Pain description: throbbing constantly Aggravating factors: weather Relieving factors: heat, rest  PRECAUTIONS: ICD/Pacemaker    WEIGHT BEARING RESTRICTIONS: No  FALLS: Has patient fallen in last 6 months? No  LIVING ENVIRONMENT: Lives with: lives with an adult companion Lives in: House/apartment Stairs: Yes: External: 1 steps; none Has following equipment at home: Walker - 2 wheeled  PLOF: Independent  PATIENT GOALS: I want to be able to walk and stand longer without having to sit down, I want to be able to get out of shower without relying on my 61 y.o mother to help  OBJECTIVE:   DIAGNOSTIC FINDINGS: na  COGNITION: Overall cognitive status: Within functional limits for tasks assessed   SENSATION: WFL  COORDINATION: Toe tapping, marching wfl B  EDEMA: none noted today   MUSCLE TONE: wnl   POSTURE: forward head and weight shift left  LOWER EXTREMITY ROM:   all wfl, UE Rom wfl    LOWER EXTREMITY MMT:    MMT Right Eval Left Eval  Hip  flexion    Hip extension    Hip abduction    Hip adduction    Hip internal rotation    Hip external rotation    Knee flexion    Knee extension 4+ 4+  Ankle dorsiflexion    Ankle plantarflexion    Ankle inversion    Ankle eversion    (Blank rows = not tested)  GAIT: Gait pattern: wide BOS Distance walked: 240' Assistive device utilized: Environmental consultant - 2 wheeled Level of assistance: Modified independence Comments: used walker to arrive to appt, functional tests performed without device  FUNCTIONAL TESTS:  30 seconds chair stand test 10 reps Timed up and go (TUG): 13.84 3 minute walk test: level of exertion 14   TODAY'S TREATMENT:                                                                                                                              DATE:   04/14/23 NuStep L5 x 7 min STS with OHP yellow ball 2x10 Standing shoulder ext/rows blue t-band 2x10 Step ups 6 in up/down with R x10; up/down with L x10 Black bar PF 2x10 Side steps airex to 6 in step both ways x15 HS curls 25 lb 2x10 Knee ext 5 lb 2x10  04/02/23: Gait x 450' indoors, no device Seated long arc quads 5# cuff wt, alternating, 10 x 2 Recumbent bike level 2, 5 min In ll bars for red t band alt hip abd, alt hip ext, mass practice In ll bars  heel toe rocks Sit to stand from standard chair airex under feet 10 x 2  Heel/toe gait on airex balance beam 6 laps ll bars   03/26/23:slightly abbreviated session a pt arrived late, her transportation was late. Nustep level 3, B Ue's and B LE's 7 min Leg ext B 5#, 2 x 10, 10#, 2 x 10 B hamstring curls 20#, 3 x 10 Sit to stand feet on airex 2x 10, hi low table  In ll bars for green t band alt hip abd, alt hip ext, alt SLR 10 x each Gait in clinic, 6 laps, 80' each lap without device, with SBA to tolerance Frequent cues to engage in pursed lip breathing in session.   03/24/23 STS on airex 2x10  Side steps over obstacles  NuStep L5 x63mins  Leg ext 5# 2x10 HS curls  20# 2x10 Walking 2 laps in gym    03/21/23 Nustep level 5 x 6 minutes HR up to 140 bpm after Nustep Sit 2 minutes HR 105 bpm, she does report a normally fast HR 3 minute walk 500 feet HR up to 138 bpm 2 minute rest down to 93 2# marches, hip abduction and extension 2 x10 each leg Leg curls 20# 2 x10 Leg extenison 5# 2x10 Volley ball with some steps Ball kicks Walking direction changes  03/18/23 NuStep L5 x35mins  STS 2x10  Walking laps in gym 5 laps without walker  Standing marches 2# 20 reps alt  Standing hip abd 2# 2x10  LAQ 2x10 2#  Step ups 6" Walking on beam   03/12/23: no home inst today    PATIENT EDUCATION: Education details: POC, goals Person educated: Patient Education method: Medical illustrator Education comprehension: verbalized understanding  HOME EXERCISE PROGRAM: na  GOALS: Goals reviewed with patient? Yes  SHORT TERM GOALS: Target date: 03/26/23  I HEP Baseline: to be established Goal status: met 03/21/23   LONG TERM GOALS: Target date: 06/04/23   Improve with 3 min walk test to perceived level of exertion of 9 Baseline: 14 Goal status: INITIAL  2.  Improve 30 sec sit to stand test to 15 Baseline: 10, ( below norm for her age ) Goal status: INITIAL  3.  Improve FGA to 28/30 Baseline: 20 Goal status: INITIAL  ASSESSMENT:  CLINICAL IMPRESSION: Patient is a 52 y.o. female who was seen today for physical therapy treatment for R MCA CVA, along with CHF. Pt reported feeling pain in her knees and back prior to the session, and she can walk to the mailbox without a lot of exertion. Pt is dealing with depression and anxiety, which is making her condition hard to manage. Pt does shoulder ext with t-band at home. Verbal cues needed during shoulder ext to straighten elbows, stand up tall, and control the eccentric part, along with keeping knees straight during plantarflexion on the black bar. Tactile cues needed during rows to emphasize  squeezing shoulder blades together. Pt tolerated treatment well and would benefit from continued PT in order to increase endurance and activity tolerance.   OBJECTIVE IMPAIRMENTS: cardiopulmonary status limiting activity, decreased activity tolerance, decreased balance, decreased endurance, and pain.   ACTIVITY LIMITATIONS: carrying, lifting, bending, stairs, transfers, bathing, and locomotion level  PARTICIPATION LIMITATIONS: meal prep, cleaning, laundry, driving, shopping, and community activity  PERSONAL FACTORS: Fitness, Past/current experiences, Time since onset of injury/illness/exacerbation, and 1-2 comorbidities: pre diabetic, new onset CHF, CVA  are also affecting patient's functional outcome.   REHAB POTENTIAL: Good  CLINICAL DECISION MAKING:  Evolving/moderate complexity  EVALUATION COMPLEXITY: Moderate  PLAN:  PT FREQUENCY: 2x/week  PT DURATION: 12 weeks  PLANNED INTERVENTIONS: Therapeutic exercises, Therapeutic activity, Neuromuscular re-education, Balance training, Gait training, Patient/Family education, Self Care, and Joint mobilization  PLAN FOR NEXT SESSION: continue to gradually increase activity tolerance and endurance   Grayce Sessions, PTA 04/14/2023, 1:30 PM

## 2023-04-14 NOTE — Progress Notes (Signed)
Patient ID: Stephanie Reilly, female    DOB: [birth year]  MRN: [id number]  CC: Gynecologic Exam (Pap.)   Subjective: Stephanie Reilly is a 52 y.o. female who presents for vaginal itching Her concerns today include:  Patient with history of HTN, HL, preDM, combined CHF with EF less than 20%, embolic CVA,  morbid obesity, IDA, anx/dep.   This visit was for Pap smear.  However patient tells me today that she had a hysterectomy in 2018 due to fibroids.   Thinks she currently has a yeast infection.   +vaginal itching that started today but no dischg. Not sure if from new detergent that her daughter uses or from [place] which she has been on now for 2 mths  HM:  had 1st dose of shingles and COVID vaccine at [facility] in June.  Had Tdapt this past Fridays Patient Active Problem List   Diagnosis Date Noted   Osteoarthritis of both knees 03/03/2023   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) 02/15/2023   Shock circulatory (HCC) 02/15/2023   Iron deficiency anemia 02/09/2023   Heart failure (HCC) 02/08/2023   Hypokalemia 02/08/2023   Acute on chronic combined systolic and diastolic CHF (congestive heart failure) (HCC) 02/07/2023   Essential hypertension 02/07/2023   Diabetes mellitus type 2, diet-controlled (HCC) 02/07/2023   Elevated troponin 02/07/2023   Obesity, Class III, BMI 40-49.9 (morbid obesity) (HCC) 02/07/2023     Current Outpatient Medications on File Prior to Visit  Medication Sig Dispense Refill   apixaban (ELIQUIS) 5 MG TABS tablet Take 1 tablet (5 mg total) by mouth 2 (two) times daily. 60 tablet 2   Ascorbic Acid (VITAMIN C) 100 MG tablet Take 100 mg by mouth daily.     Biotin (BIOTIN MAXIMUM STRENGTH) 10 MG TABS Take 10 mg by mouth daily.     carvedilol (COREG) 6.25 MG tablet Take 1.5 tablets (9.375 mg total) by mouth 2 (two) times daily with a meal. 90 tablet 6   cetirizine (ZYRTEC) 10 MG tablet Take 10 mg by mouth daily.     dapagliflozin propanediol  (FARXIGA) 10 MG TABS tablet Take 1 tablet (10 mg total) by mouth daily. 90 tablet 1   digoxin (LANOXIN) 0.125 MG tablet Take 1 tablet (0.125 mg total) by mouth daily. 90 tablet 1   polyethylene glycol (MIRALAX / GLYCOLAX) 17 g packet Take 17 g by mouth daily as needed. 30 packet 3   rosuvastatin (CRESTOR) 20 MG tablet Take 1 tablet (20 mg total) by mouth daily. 90 tablet 1   sacubitril-valsartan (ENTRESTO) 49-51 MG Take 1 tablet by mouth 2 (two) times daily. 180 tablet 3   senna-docusate (SENOKOT-S) 8.6-50 MG tablet Take 1 tablet by mouth at bedtime as needed for mild constipation. 30 tablet 0   spironolactone (ALDACTONE) 25 MG tablet Take 1 tablet (25 mg total) by mouth daily. 90 tablet 1   torsemide (DEMADEX) 20 MG tablet Take 1 tablet (20 mg total) by mouth daily as needed (for increased edema). 30 tablet 5   No current facility-administered medications on file prior to visit.    Allergies  Allergen Reactions   Paroxetine Hcl Anaphylaxis   Prednisone Shortness Of Breath and Other (See Comments)    Confusion, "messes with mental," muscles feel weird, fatigue, insomnia   Chocolate Other (See Comments)    Unknown    Ibuprofen Other (See Comments)    Unknown    Iron Other (See Comments)    GI upset (reported 08/04/2017). Pt  clarifies had constipation, nausea.  Stool softeners did not help.    Latex Other (See Comments)    Unknown    Lisinopril Hives   Naproxen Other (See Comments)    Unknown    Tramadol Hives   Codeine Rash   Penicillins Rash   Serotonin Nausea Only, Swelling, Anxiety, Rash and Other (See Comments)    GI intolerence    Social History   Socioeconomic History   Marital status: Single    Spouse name: Not on file   Number of children: 3   Years of education: Not on file   Highest education level: Bachelor's degree (e.g., BA, AB, BS)  Occupational History   Occupation: self employed  Tobacco Use   Smoking status: Former    Packs/day: 0.50    Years: 11.00     Additional pack years: 0.00    Total pack years: 5.50    Types: Cigarettes    Quit date: 10/2021    Years since quitting: 1.5    Passive exposure: Past   Smokeless tobacco: Never  Vaping Use   Vaping Use: Never used  Substance and Sexual Activity   Alcohol use: Never   Drug use: Yes    Types: Marijuana    Comment: routine use of CBD edibles   Sexual activity: Not on file  Other Topics Concern   Not on file  Social History Narrative   Not on file   Social Determinants of Health   Financial Resource Strain: Low Risk  (02/20/2023)   Overall Financial Resource Strain (CARDIA)    Difficulty of Paying Living Expenses: Not hard at all  Food Insecurity: No Food Insecurity (02/20/2023)   Hunger Vital Sign    Worried About Running Out of Food in the Last Year: Never true    Ran Out of Food in the Last Year: Never true  Transportation Needs: No Transportation Needs (02/20/2023)   PRAPARE - Administrator, Civil Service (Medical): No    Lack of Transportation (Non-Medical): No  Physical Activity: Insufficiently Active (02/20/2023)   Exercise Vital Sign    Days of Exercise per Week: 4 days    Minutes of Exercise per Session: 20 min  Stress: No Stress Concern Present (02/20/2023)   [employer] of Occupational Health - Occupational Stress Questionnaire    Feeling of Stress : Only a little  Social Connections: Moderately Isolated (02/20/2023)   Social Connection and Isolation Panel [NHANES]    Frequency of Communication with Friends and Family: More than three times a week    Frequency of Social Gatherings with Friends and Family: Once a week    Attends Religious Services: More than 4 times per year    Active Member of [employer] or Organizations: No    Attends Engineer, structural: Not on file    Marital Status: Never married  Intimate Partner Violence: Not At Risk (02/15/2023)   Humiliation, Afraid, Rape, and Kick questionnaire    Fear of Current or Ex-Partner: No     Emotionally Abused: No    Physically Abused: No    Sexually Abused: No    Family History  Problem Relation Age of Onset   Hypertension Mother    Heart failure Mother    COPD Mother    Diabetes Paternal Grandfather     Past Surgical History:  Procedure Laterality Date   ABDOMINAL HYSTERECTOMY     IR CT HEAD LTD  02/15/2023   IR PERCUTANEOUS ART THROMBECTOMY/INFUSION INTRACRANIAL INC  DIAG ANGIO  02/15/2023   IR US GUIDE VASC ACCESS RIGHT  02/15/2023   RADIOLOGY WITH ANESTHESIA N/A 02/15/2023   Procedure: IR WITH ANESTHESIA;  Surgeon: Radiologist, Medication, MD;  Location: [facility] OR;  Service: Radiology;  Laterality: N/A;   RIGHT/LEFT HEART CATH AND CORONARY ANGIOGRAPHY N/A 02/10/2023   Procedure: RIGHT/LEFT HEART CATH AND CORONARY ANGIOGRAPHY;  Surgeon: [place]azil[place], Peter M, MD;  Location: [facility] INVASIVE CV LAB;  Service: Cardiovascular;  Laterality: N/A;   TONSILLECTOMY     TUBAL LIGATION      ROS: Review of Systems Negative except as stated above  PHYSICAL EXAM: BP 109/79 (BP Location: Left Arm, Patient Position: Sitting, Cuff Size: Large)   Pulse (!) 105   Temp 98.6 F (37 C) (Oral)   Ht 5\' 2"  (1.575 m)   Wt 245 lb (111.1 kg)   SpO2 97%   BMI 44.81 kg/m   Physical Exam  General appearance - alert, well appearing, middle age AAF and in no distress Mental status - normal mood, behavior, speech, dress, motor activity, and thought processes      Latest Ref Rng & Units 04/11/2023    9:30 AM 03/24/2023    8:38 AM 02/28/2023   10:28 AM  CMP  Glucose 70 - 99 mg/dL 161  096  045   BUN 6 - 20 mg/dL 14  17  16    Creatinine 0.44 - 1.00 mg/dL 4.09  8.11  9.14   Sodium 135 - 145 mmol/L 138  144  137   Potassium 3.5 - 5.1 mmol/L 4.3  4.7  4.5   Chloride 98 - 111 mmol/L 104  101  106   CO2 22 - 32 mmol/L 26  22  22    Calcium 8.9 - 10.3 mg/dL 78.2  95.6  21.3    Lipid Panel     Component Value Date/Time   CHOL 155 02/16/2023 0945   TRIG 62 02/16/2023 0945   HDL 45 02/16/2023 0945    CHOLHDL 3.4 02/16/2023 0945   VLDL 12 02/16/2023 0945   LDLCALC 98 02/16/2023 0945    CBC    Component Value Date/Time   WBC 10.4 02/28/2023 1028   RBC 5.14 (H) 02/28/2023 1028   HGB 13.7 02/28/2023 1028   HCT 43.5 02/28/2023 1028   PLT 333 02/28/2023 1028   MCV 84.6 02/28/2023 1028   MCH 26.7 02/28/2023 1028   MCHC 31.5 02/28/2023 1028   RDW 15.3 02/28/2023 1028   LYMPHSABS 3.3 02/15/2023 1245   MONOABS 0.9 02/15/2023 1245   EOSABS 0.1 02/15/2023 1245   BASOSABS 0.1 02/15/2023 1245    ASSESSMENT AND PLAN:  1. Vaginal itching Pap is not indicated since she has had hysterectomy for noncancerous reason. We will have her do a self swab to check for yeast.  She declines screening for chlamydia, gonorrhea, trichomonas.  We will treat empirically with Diflucan.  She will try changing back to washing detergent that she used before.  If she starts having recurrent vaginal yeast infections, we will need to stop the [place]. - fluconazole (DIFLUCAN) 150 MG tablet; Take 1 tablet (150 mg total) by mouth daily.  Dispense: 1 tablet; Refill: 0 - Cervicovaginal ancillary only  2. Screening for colon cancer Patient due for colon cancer screening.  She prefers to do the Cologuard test. - Cologuard    Patient was given the opportunity to ask questions.  Patient verbalized understanding of the plan and was able to repeat key elements of the plan.  This documentation was completed using Paediatric nurse.  Any transcriptional errors are unintentional.  No orders of the defined types were placed in this encounter.    Requested Prescriptions    No prescriptions requested or ordered in this encounter    No follow-ups on file.  Jonah Blue, MD, FACP

## 2023-04-14 NOTE — Patient Instructions (Signed)
Try changing your detergent. I sent the prescription to your pharmacy for the Diflucan pill that treats vaginal yeast infection.

## 2023-04-15 DIAGNOSIS — I509 Heart failure, unspecified: Secondary | ICD-10-CM | POA: Diagnosis not present

## 2023-04-16 ENCOUNTER — Other Ambulatory Visit: Payer: Self-pay

## 2023-04-16 ENCOUNTER — Ambulatory Visit: Payer: Medicaid Other

## 2023-04-16 DIAGNOSIS — R6889 Other general symptoms and signs: Secondary | ICD-10-CM

## 2023-04-16 DIAGNOSIS — I509 Heart failure, unspecified: Secondary | ICD-10-CM | POA: Diagnosis not present

## 2023-04-16 DIAGNOSIS — Z7689 Persons encountering health services in other specified circumstances: Secondary | ICD-10-CM | POA: Diagnosis not present

## 2023-04-16 DIAGNOSIS — I69352 Hemiplegia and hemiparesis following cerebral infarction affecting left dominant side: Secondary | ICD-10-CM

## 2023-04-16 LAB — CERVICOVAGINAL ANCILLARY ONLY
Candida Glabrata: NEGATIVE
Candida Vaginitis: NEGATIVE
Comment: NEGATIVE
Comment: NEGATIVE

## 2023-04-16 NOTE — Therapy (Signed)
OUTPATIENT PHYSICAL THERAPY NEURO TREATMENT   Patient Name: Stephanie Reilly MRN: [id number] DOB:Mar 25, 1971, 52 y.o., female Today's Date: 04/16/2023   PCP: Jonah Blue, MD REFERRING PROVIDER: Brynda Peon, NP  END OF SESSION:  PT End of Session - 04/16/23 1349     Visit Number 8    Date for PT Re-Evaluation 06/04/23    Progress Note Due on Visit 10    PT Start Time 1349    PT Stop Time 1430    PT Time Calculation (min) 41 min                Past Medical History:  Diagnosis Date   Anemia    Anxiety    Arthritis    Depression    Hypertension    Prediabetes    Past Surgical History:  Procedure Laterality Date   ABDOMINAL HYSTERECTOMY     IR CT HEAD LTD  02/15/2023   IR PERCUTANEOUS ART THROMBECTOMY/INFUSION INTRACRANIAL INC DIAG ANGIO  02/15/2023   IR US GUIDE VASC ACCESS RIGHT  02/15/2023   RADIOLOGY WITH ANESTHESIA N/A 02/15/2023   Procedure: IR WITH ANESTHESIA;  Surgeon: Radiologist, Medication, MD;  Location: [facility] OR;  Service: Radiology;  Laterality: N/A;   RIGHT/LEFT HEART CATH AND CORONARY ANGIOGRAPHY N/A 02/10/2023   Procedure: RIGHT/LEFT HEART CATH AND CORONARY ANGIOGRAPHY;  Surgeon: [place], Peter M, MD;  Location: [facility] INVASIVE CV LAB;  Service: Cardiovascular;  Laterality: N/A;   TONSILLECTOMY     TUBAL LIGATION     Patient Active Problem List   Diagnosis Date Noted   Osteoarthritis of both knees 03/03/2023   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) 02/15/2023   Shock circulatory (HCC) 02/15/2023   Iron deficiency anemia 02/09/2023   Heart failure (HCC) 02/08/2023   Hypokalemia 02/08/2023   Acute on chronic combined systolic and diastolic CHF (congestive heart failure) (HCC) 02/07/2023   Essential hypertension 02/07/2023   Diabetes mellitus type 2, diet-controlled (HCC) 02/07/2023   Elevated troponin 02/07/2023   Obesity, Class III, BMI 40-49.9 (morbid obesity) (HCC) 02/07/2023    ONSET DATE: 02/15/23  REFERRING DIAG: CVA with L  hemi  THERAPY DIAG:  Hemiplegia and hemiparesis following cerebral infarction affecting left dominant side (HCC)  Impaired tolerance of activity  Rationale for Evaluation and Treatment: Rehabilitation  SUBJECTIVE:                                                                                                                                                                                             SUBJECTIVE STATEMENT:calves really sore after last session.  Heart seems to be working alright  Pt accompanied  by: self  PERTINENT HISTORY: h/o CHF hospitalized 5/3 to 02/12/23, returned home and was  Rehospitalized with R thrombotic Middle Cerebral artery, CVA. Hospitalized for 5 days after and sent home.  PAIN:  Are you having pain? Yes: NPRS scale: 7/10 Pain location: R knee  Pain description: throbbing constantly Aggravating factors: weather Relieving factors: heat, rest  PRECAUTIONS: ICD/Pacemaker    WEIGHT BEARING RESTRICTIONS: No  FALLS: Has patient fallen in last 6 months? No  LIVING ENVIRONMENT: Lives with: lives with an adult companion Lives in: House/apartment Stairs: Yes: External: 1 steps; none Has following equipment at home: Walker - 2 wheeled  PLOF: Independent  PATIENT GOALS: I want to be able to walk and stand longer without having to sit down, I want to be able to get out of shower without relying on my 24 y.o mother to help  OBJECTIVE:   DIAGNOSTIC FINDINGS: na  COGNITION: Overall cognitive status: Within functional limits for tasks assessed   SENSATION: WFL  COORDINATION: Toe tapping, marching wfl B  EDEMA: none noted today   MUSCLE TONE: wnl   POSTURE: forward head and weight shift left  LOWER EXTREMITY ROM:   all wfl, UE Rom wfl    LOWER EXTREMITY MMT:    MMT Right Eval Left Eval  Hip flexion    Hip extension    Hip abduction    Hip adduction    Hip internal rotation    Hip external rotation    Knee flexion    Knee extension 4+  4+  Ankle dorsiflexion    Ankle plantarflexion    Ankle inversion    Ankle eversion    (Blank rows = not tested)  GAIT: Gait pattern: wide BOS Distance walked: 240' Assistive device utilized: Environmental consultant - 2 wheeled Level of assistance: Modified independence Comments: used walker to arrive to appt, functional tests performed without device  FUNCTIONAL TESTS:  30 seconds chair stand test 10 reps Timed up and go (TUG): 13.84 3 minute walk test: level of exertion 14   TODAY'S TREATMENT:                                                                                                                              DATE:  04/16/23: Nustep: level 5 B Ue's and LE's 9 min Seated shoulder ext/rows black t-band 2x10 In ll bars for green t band penguins and for/ back rocking to engage proximal hip stability, mass practice Seated long arc quads 5# , 2 x 10  Sit to stand from chair with airex under feet 10 reps, hands on thighs Seated hamstring curls blue  t band 15 x each Gait on airex balance beam, 6' x 5 laps in ll bars Standing on airex heel toe rocks in ll bars, mass practice  Gait 2 laps , 180' without device, at end of session  04/14/23 NuStep L5 x 7 min STS with OHP yellow ball 2x10 Standing shoulder ext/rows blue t-band  2x10 Step ups 6 in up/down with R x10; up/down with L x10 Black bar PF 2x10 Side steps airex to 6 in step both ways x15 HS curls 25 lb 2x10 Knee ext 5 lb 2x10  04/02/23: Gait x 450' indoors, no device Seated long arc quads 5# cuff wt, alternating, 10 x 2 Recumbent bike level 2, 5 min In ll bars for red t band alt hip abd, alt hip ext, mass practice In ll bars heel toe rocks Sit to stand from standard chair airex under feet 10 x 2  Heel/toe gait on airex balance beam 6 laps ll bars   03/26/23:slightly abbreviated session a pt arrived late, her transportation was late. Nustep level 3, B Ue's and B LE's 7 min Leg ext B 5#, 2 x 10, 10#, 2 x 10 B hamstring curls 20#, 3  x 10 Sit to stand feet on airex 2x 10, hi low table  In ll bars for green t band alt hip abd, alt hip ext, alt SLR 10 x each Gait in clinic, 6 laps, 80' each lap without device, with SBA to tolerance Frequent cues to engage in pursed lip breathing in session.   03/24/23 STS on airex 2x10  Side steps over obstacles  NuStep L5 x74mins  Leg ext 5# 2x10 HS curls 20# 2x10 Walking 2 laps in gym    03/21/23 Nustep level 5 x 6 minutes HR up to 140 bpm after Nustep Sit 2 minutes HR 105 bpm, she does report a normally fast HR 3 minute walk 500 feet HR up to 138 bpm 2 minute rest down to 93 2# marches, hip abduction and extension 2 x10 each leg Leg curls 20# 2 x10 Leg extenison 5# 2x10 Volley ball with some steps Ball kicks Walking direction changes  03/18/23 NuStep L5 x70mins  STS 2x10  Walking laps in gym 5 laps without walker  Standing marches 2# 20 reps alt  Standing hip abd 2# 2x10  LAQ 2x10 2#  Step ups 6" Walking on beam   03/12/23: no home inst today    PATIENT EDUCATION: Education details: POC, goals Person educated: Patient Education method: Medical illustrator Education comprehension: verbalized understanding  HOME EXERCISE PROGRAM: na  GOALS: Goals reviewed with patient? Yes  SHORT TERM GOALS: Target date: 03/26/23  I HEP Baseline: to be established Goal status: met 03/21/23   LONG TERM GOALS: Target date: 06/04/23   Improve with 3 min walk test to perceived level of exertion of 9 Baseline: 14 Goal status: INITIAL  2.  Improve 30 sec sit to stand test to 15 Baseline: 10, ( below norm for her age ) Goal status: INITIAL  3.  Improve FGA to 28/30 Baseline: 20 Goal status: INITIAL  ASSESSMENT:  CLINICAL IMPRESSION: Patient is a 52 y.o. female who was seen today for physical therapy treatment for R MCA CVA, along with CHF. Patient is gradually improving I regard to her tolerance to activities and I with mobility, balance.  Required some seated  rest breaks.  Pt tolerated treatment well and would benefit from continued PT in order to increase endurance and activity tolerance.   OBJECTIVE IMPAIRMENTS: cardiopulmonary status limiting activity, decreased activity tolerance, decreased balance, decreased endurance, and pain.   ACTIVITY LIMITATIONS: carrying, lifting, bending, stairs, transfers, bathing, and locomotion level  PARTICIPATION LIMITATIONS: meal prep, cleaning, laundry, driving, shopping, and community activity  PERSONAL FACTORS: Fitness, Past/current experiences, Time since onset of injury/illness/exacerbation, and 1-2 comorbidities: pre diabetic, new onset CHF, CVA  are also affecting patient's functional outcome.   REHAB POTENTIAL: Good  CLINICAL DECISION MAKING: Evolving/moderate complexity  EVALUATION COMPLEXITY: Moderate  PLAN:  PT FREQUENCY: 2x/week  PT DURATION: 12 weeks  PLANNED INTERVENTIONS: Therapeutic exercises, Therapeutic activity, Neuromuscular re-education, Balance training, Gait training, Patient/Family education, Self Care, and Joint mobilization  PLAN FOR NEXT SESSION: continue to gradually increase activity tolerance and endurance   Barclay Lennox L Joel Cowin, PT 04/16/2023, 5:59 PM

## 2023-04-17 DIAGNOSIS — I509 Heart failure, unspecified: Secondary | ICD-10-CM | POA: Diagnosis not present

## 2023-04-17 DIAGNOSIS — Z1211 Encounter for screening for malignant neoplasm of colon: Secondary | ICD-10-CM | POA: Diagnosis not present

## 2023-04-20 DIAGNOSIS — H5213 Myopia, bilateral: Secondary | ICD-10-CM | POA: Diagnosis not present

## 2023-04-21 DIAGNOSIS — I420 Dilated cardiomyopathy: Secondary | ICD-10-CM | POA: Diagnosis not present

## 2023-04-22 DIAGNOSIS — I509 Heart failure, unspecified: Secondary | ICD-10-CM | POA: Diagnosis not present

## 2023-04-23 ENCOUNTER — Encounter: Payer: Self-pay | Admitting: Cardiology

## 2023-04-23 ENCOUNTER — Ambulatory Visit: Payer: Medicaid Other | Attending: Cardiology | Admitting: Cardiology

## 2023-04-23 VITALS — BP 98/64 | HR 76 | Ht 62.0 in | Wt 243.0 lb

## 2023-04-23 DIAGNOSIS — I472 Ventricular tachycardia, unspecified: Secondary | ICD-10-CM | POA: Diagnosis not present

## 2023-04-23 DIAGNOSIS — I5022 Chronic systolic (congestive) heart failure: Secondary | ICD-10-CM | POA: Diagnosis not present

## 2023-04-23 DIAGNOSIS — Z7689 Persons encountering health services in other specified circumstances: Secondary | ICD-10-CM | POA: Diagnosis not present

## 2023-04-23 DIAGNOSIS — I509 Heart failure, unspecified: Secondary | ICD-10-CM | POA: Diagnosis not present

## 2023-04-23 NOTE — Progress Notes (Signed)
  Electrophysiology Office Note:   Date:  04/23/2023  ID:  Stephanie Reilly, DOB 1971/10/07, MRN [id number]  Primary Cardiologist: Rollene Rotunda, MD Electrophysiologist: Regan Lemming, MD      History of Present Illness:   Stephanie Reilly is a 52 y.o. female with h/o chronic systolic heart failure, ventricular tachycardia seen today for routine electrophysiology followup.  Since last being seen in our clinic the patient reports doing well.  She has had no further palpitations.  Since starting optimal heart failure medications, she is felt significantly improved with more energy and less shortness of breath.  She is now able to do all of her daily activities without restriction..  she denies chest pain, palpitations, dyspnea, PND, orthopnea, nausea, vomiting, dizziness, syncope, edema, weight gain, or early satiety.   Review of systems complete and found to be negative unless listed in HPI.   EP Information / Studies Reviewed:    EKG is not ordered today. EKG from 03/14/23 reviewed which showed sinus rhythm, rate 103, LVH        Risk Assessment/Calculations:              Physical Exam:   VS:  BP 98/64   Pulse 76   Ht 5\' 2"  (1.575 m)   Wt 243 lb (110.2 kg)   SpO2 96%   BMI 44.45 kg/m    Wt Readings from Last 3 Encounters:  04/23/23 243 lb (110.2 kg)  04/14/23 245 lb (111.1 kg)  04/11/23 245 lb 12.8 oz (111.5 kg)     GEN: Well nourished, well developed in no acute distress NECK: No JVD; No carotid bruits CARDIAC: Regular rate and rhythm, no murmurs, rubs, gallops RESPIRATORY:  Clear to auscultation without rales, wheezing or rhonchi  ABDOMEN: Soft, non-tender, non-distended EXTREMITIES:  No edema; No deformity   ASSESSMENT AND PLAN:    1.  Chronic systolic heart failure: Newly diagnosed May 2024.  Due to nonischemic cardiomyopathy.  Had PEA arrest, likely after induction for intubation.  Currently on optimal medical therapy per heart failure cardiology.  Has an echo  upcoming.  If echo shows a reduced ejection fraction persistently, Akyia Borelli need ICD therapy.  2.  Nonsustained VT/PVCs: Improved after therapy for heart failure.  She has an upcoming echo.  If echo shows an improved ejection fraction, she can take off her LifeVest.  If EF remains low, Nykira Reddix need ICD therapy.  Follow up with Dr. Elberta Fortis  pending echo results   Signed, Fabiana Dromgoole Jorja Loa, MD

## 2023-04-24 DIAGNOSIS — I509 Heart failure, unspecified: Secondary | ICD-10-CM | POA: Diagnosis not present

## 2023-04-25 DIAGNOSIS — I509 Heart failure, unspecified: Secondary | ICD-10-CM | POA: Diagnosis not present

## 2023-04-25 LAB — COLOGUARD: COLOGUARD: NEGATIVE

## 2023-04-27 DIAGNOSIS — I509 Heart failure, unspecified: Secondary | ICD-10-CM | POA: Diagnosis not present

## 2023-04-29 ENCOUNTER — Telehealth: Payer: Self-pay | Admitting: Internal Medicine

## 2023-04-29 DIAGNOSIS — I509 Heart failure, unspecified: Secondary | ICD-10-CM | POA: Diagnosis not present

## 2023-04-29 NOTE — Telephone Encounter (Signed)
Paige with [employer] is calling to check on an order they sent over to the office for Incontinence Supplies on 04/21/2023. Please advise.    Phone number: [phone] Fax number: [phone]

## 2023-04-30 DIAGNOSIS — I509 Heart failure, unspecified: Secondary | ICD-10-CM | POA: Diagnosis not present

## 2023-04-30 NOTE — Telephone Encounter (Signed)
Called & spoke to [place]. Requested for order to be re-faxed to [facility]. Idalia Needle confirmed that forms will be re-sent to [facility].

## 2023-05-01 DIAGNOSIS — I509 Heart failure, unspecified: Secondary | ICD-10-CM | POA: Diagnosis not present

## 2023-05-02 DIAGNOSIS — I509 Heart failure, unspecified: Secondary | ICD-10-CM | POA: Diagnosis not present

## 2023-05-03 DIAGNOSIS — I509 Heart failure, unspecified: Secondary | ICD-10-CM | POA: Diagnosis not present

## 2023-05-05 DIAGNOSIS — I509 Heart failure, unspecified: Secondary | ICD-10-CM | POA: Diagnosis not present

## 2023-05-06 DIAGNOSIS — I509 Heart failure, unspecified: Secondary | ICD-10-CM | POA: Diagnosis not present

## 2023-05-07 ENCOUNTER — Telehealth: Payer: Self-pay | Admitting: Internal Medicine

## 2023-05-08 ENCOUNTER — Other Ambulatory Visit: Payer: Self-pay

## 2023-05-08 ENCOUNTER — Telehealth: Payer: Self-pay | Admitting: Internal Medicine

## 2023-05-08 DIAGNOSIS — E118 Type 2 diabetes mellitus with unspecified complications: Secondary | ICD-10-CM | POA: Insufficient documentation

## 2023-05-08 DIAGNOSIS — I509 Heart failure, unspecified: Secondary | ICD-10-CM | POA: Diagnosis not present

## 2023-05-08 DIAGNOSIS — I472 Ventricular tachycardia, unspecified: Secondary | ICD-10-CM | POA: Insufficient documentation

## 2023-05-08 DIAGNOSIS — Z419 Encounter for procedure for purposes other than remedying health state, unspecified: Secondary | ICD-10-CM | POA: Diagnosis not present

## 2023-05-08 NOTE — Telephone Encounter (Signed)
Pt is calling in because she needs Dr. Laural Benes to do a Prior Authorization for Flonase. Pt says the insurance will not cover it unless they have a PA.

## 2023-05-08 NOTE — Progress Notes (Signed)
Cardiology Office Note:   Date:  05/09/2023  ID:  Stephanie Reilly, DOB 1971/03/11, MRN [id number] PCP: Marcine Matar, MD  [facility] Providers Cardiologist:  Rollene Rotunda, MD Electrophysiologist:  Will Jorja Loa, MD {  History of Present Illness:   Stephanie Reilly is a 52 y.o. female  with hypertension, hyperlipidemia, type 2 diabetes, obesity and recently diagnosed systolic heart failure.  She was in the hospital with stroke and occlusion of the proximal right M2.  She had PEA arrest and was intubated.    EF was 20%.  Left heart cath demonstrated normal coronaries.    She was treated with Eliquis.  She was managed with meds as below.  He is wearing a life vest.  She has a follow-up echo scheduled in August.  She has follow-up with EP scheduled.  She had an MRI and I reviewed these results.  The ejection fraction was said to be 12%.  The there was a small mid inferior wall subendocardial infarct possible and they could not exclude sarcoid.  A PET scan is ordered.  This was not done. Echo in May demonstrated an EF of less than 20%.    She has an echo ordered for later this month.   MRI previously demonstrated subendocardial LGE in the mid and small and sarcoid could not be excluded.  FDG PET has been ordered.    She recently was able to spend the whole day playing with a radio grandson.  She had loss of activity with this.  She is wearing her LifeVest.  The patient denies any new symptoms such as chest discomfort, neck or arm discomfort. There has been no new shortness of breath, PND or orthopnea. There have been no reported palpitations, presyncope or syncope.  She says her heart rate is baseline around 100 or so or higher.    ROS: As stated in the HPI and negative for all other systems.  Studies Reviewed:    EKG:      NA  Risk Assessment/Calculations:              Physical Exam:   VS:  BP 118/80   Pulse 90   Ht 5\' 2"  (1.575 m)   Wt 243 lb 9.6 oz (110.5 kg)   SpO2 98%    BMI 44.56 kg/m    Wt Readings from Last 3 Encounters:  05/09/23 243 lb 9.6 oz (110.5 kg)  04/23/23 243 lb (110.2 kg)  04/14/23 245 lb (111.1 kg)     GEN: Well nourished, well developed in no acute distress NECK: No JVD; No carotid bruits CARDIAC: RRR, no murmurs, rubs, gallops RESPIRATORY:  Clear to auscultation without rales, wheezing or rhonchi  ABDOMEN: Soft, non-tender, non-distended EXTREMITIES:  No edema; No deformity   ASSESSMENT AND PLAN:   PEA arrest :    Follow up echo is scheduled for later this month. If her EF is still reduced she will be referred for an ICD.  Today ongoing increase her carvedilol to 12.5 mg twice daily.  She continues to wear LifeVest.   Cardiomyopathy: Repeat echo and PET scan pending with med titration as above.  Right M2 stroke: It was suspected that she had cardioembolic source and she had mechanical thrombectomy.  She is tolerating anticoagulation and has had no residual.   DMII: A1c was 6.1.  No change in therapy.  H/O NSVT /PVCs: As above.     Follow up with me in about 2 months.  He has follow-up in  the Advanced Heart Failure clinic on the day of her echo  Signed, Rollene Rotunda, MD

## 2023-05-08 NOTE — Telephone Encounter (Signed)
Called & spoke to the patient. Verified name & DOB. Scheduled a telehealth visit for 05/09/2023. Patient confirmed appointment. Forms placed in mailbox.

## 2023-05-09 ENCOUNTER — Encounter: Payer: Self-pay | Admitting: Internal Medicine

## 2023-05-09 ENCOUNTER — Telehealth (HOSPITAL_BASED_OUTPATIENT_CLINIC_OR_DEPARTMENT_OTHER): Payer: Medicaid Other | Admitting: Internal Medicine

## 2023-05-09 ENCOUNTER — Ambulatory Visit: Payer: Medicaid Other | Attending: Cardiology | Admitting: Cardiology

## 2023-05-09 ENCOUNTER — Encounter: Payer: Self-pay | Admitting: Cardiology

## 2023-05-09 VITALS — BP 118/80 | HR 90 | Ht 62.0 in | Wt 243.6 lb

## 2023-05-09 DIAGNOSIS — I5020 Unspecified systolic (congestive) heart failure: Secondary | ICD-10-CM

## 2023-05-09 DIAGNOSIS — R159 Full incontinence of feces: Secondary | ICD-10-CM

## 2023-05-09 DIAGNOSIS — I472 Ventricular tachycardia, unspecified: Secondary | ICD-10-CM

## 2023-05-09 DIAGNOSIS — R152 Fecal urgency: Secondary | ICD-10-CM | POA: Diagnosis not present

## 2023-05-09 DIAGNOSIS — Z7689 Persons encountering health services in other specified circumstances: Secondary | ICD-10-CM | POA: Diagnosis not present

## 2023-05-09 DIAGNOSIS — E118 Type 2 diabetes mellitus with unspecified complications: Secondary | ICD-10-CM | POA: Diagnosis not present

## 2023-05-09 DIAGNOSIS — N3946 Mixed incontinence: Secondary | ICD-10-CM

## 2023-05-09 MED ORDER — CARVEDILOL 12.5 MG PO TABS: 12.5000 mg | ORAL_TABLET | Freq: Two times a day (BID) | ORAL | 3 refills | Status: DC

## 2023-05-09 NOTE — Patient Instructions (Signed)
Medication Instructions:   INCREASE CARVEDILOL TO 12.5 MG TWICE DAILY= 2 OF THE 6.25 MG TABLETS TWICE DAILY  *If you need a refill on your cardiac medications before your next appointment, please call your pharmacy*   Follow-Up: At [facility], you and your health needs are our priority.  As part of our continuing mission to provide you with exceptional heart care, we have created designated Provider Care Teams.  These Care Teams include your primary Cardiologist (physician) and Advanced Practice Providers (APPs -  Physician Assistants and Nurse Practitioners) who all work together to provide you with the care you need, when you need it.  We recommend signing up for the patient portal called "MyChart".  Sign up information is provided on this After Visit Summary.  MyChart is used to connect with patients for Virtual Visits (Telemedicine).  Patients are able to view lab/test results, encounter notes, upcoming appointments, etc.  Non-urgent messages can be sent to your provider as well.   To learn more about what you can do with MyChart, go to [email or web address].    Your next appointment:   2 month(s)  Provider:   Rollene Rotunda, MD

## 2023-05-09 NOTE — Progress Notes (Signed)
Patient ID: Stephanie Reilly, female   DOB: June 19, 1971, 52 y.o.   MRN: [id number] Virtual Visit via Video Note  I connected with Stephanie Reilly on 05/09/2023 at 10:27 AM by a video enabled telemedicine application and verified that I am speaking with the correct person using two identifiers.  Location: Patient: home Provider: Office   I discussed the limitations of evaluation and management by telemedicine and the availability of in person appointments. The patient expressed understanding and agreed to proceed.  History of Present Illness: Patient with history of HTN, HL, preDM, combined CHF with EF less than 20%, embolic CVA, morbid obesity, IDA, anx/dep.  This is an urgent care visit to complete form for incontinence supplies.  I received a form from a company called [employer] requesting adult incontinence underwear.  Patient tells me that she has had incontinence of urine since the end of June.  When she gets the urge to urinate she is not able to hold it until she gets to the restroom.  She also has incontinence of urine when she laughs or cough.  Has had several episodes where she wakes up at night out of the dream and is wet the bed.  Incontinence started after being placed on diuretics.  Had some fecal incontinence at times from the Senokot-S which she takes every day.  Has MiraLAX but uses that only if the Senokot does not get the job done.    Outpatient Encounter Medications as of 05/09/2023  Medication Sig   apixaban (ELIQUIS) 5 MG TABS tablet Take 1 tablet (5 mg total) by mouth 2 (two) times daily.   Ascorbic Acid (VITAMIN C) 100 MG tablet Take 100 mg by mouth daily.   Biotin (BIOTIN MAXIMUM STRENGTH) 10 MG TABS Take 10 mg by mouth daily.   carvedilol (COREG) 6.25 MG tablet Take 1.5 tablets (9.375 mg total) by mouth 2 (two) times daily with a meal.   cetirizine (ZYRTEC) 10 MG tablet Take 10 mg by mouth daily.   dapagliflozin propanediol (FARXIGA) 10 MG TABS tablet Take 1 tablet (10  mg total) by mouth daily.   digoxin (LANOXIN) 0.125 MG tablet Take 1 tablet (0.125 mg total) by mouth daily.   polyethylene glycol (MIRALAX / GLYCOLAX) 17 g packet Take 17 g by mouth daily as needed.   rosuvastatin (CRESTOR) 20 MG tablet Take 1 tablet (20 mg total) by mouth daily.   sacubitril-valsartan (ENTRESTO) 49-51 MG Take 1 tablet by mouth 2 (two) times daily.   senna-docusate (SENOKOT-S) 8.6-50 MG tablet Take 1 tablet by mouth at bedtime as needed for mild constipation.   spironolactone (ALDACTONE) 25 MG tablet Take 1 tablet (25 mg total) by mouth daily.   torsemide (DEMADEX) 20 MG tablet Take 1 tablet (20 mg total) by mouth daily as needed (for increased edema).   No facility-administered encounter medications on file as of 05/09/2023.      Observations/Objective: Middle-age African-American female in NAD  Assessment and Plan: 1. Mixed stress and urge urinary incontinence 2. Incontinence of feces with fecal urgency Urinary incontinence started and worsened with use of diuretics.  I will sign off on form for her to get depends underwear. I recommend stopping the Senokot and using MiraLAX instead.  Use the Senokot only if constipated for a few days despite use of the MiraLAX.  Follow Up Instructions: As previously scheduled.   I discussed the assessment and treatment plan with the patient. The patient was provided an opportunity to ask questions and all were answered.  The patient agreed with the plan and demonstrated an understanding of the instructions.   The patient was advised to call back or seek an in-person evaluation if the symptoms worsen or if the condition fails to improve as anticipated.  I spent 10 minutes minutes dedicated to the care of this patient on the date of this encounter to include previsit review of records, face-to-face time with the patient discussing diagnosis and management, postvisit charting and signing off on order for incontinence supplies.  This note  has been created with Education officer, environmental. Any transcriptional errors are unintentional.  Jonah Blue, MD

## 2023-05-11 DIAGNOSIS — I509 Heart failure, unspecified: Secondary | ICD-10-CM | POA: Diagnosis not present

## 2023-05-12 DIAGNOSIS — R159 Full incontinence of feces: Secondary | ICD-10-CM | POA: Diagnosis not present

## 2023-05-12 DIAGNOSIS — I5021 Acute systolic (congestive) heart failure: Secondary | ICD-10-CM | POA: Diagnosis not present

## 2023-05-12 DIAGNOSIS — R152 Fecal urgency: Secondary | ICD-10-CM | POA: Diagnosis not present

## 2023-05-12 DIAGNOSIS — N3946 Mixed incontinence: Secondary | ICD-10-CM | POA: Diagnosis not present

## 2023-05-13 ENCOUNTER — Other Ambulatory Visit (HOSPITAL_COMMUNITY): Payer: Self-pay | Admitting: *Deleted

## 2023-05-13 DIAGNOSIS — I5020 Unspecified systolic (congestive) heart failure: Secondary | ICD-10-CM

## 2023-05-13 DIAGNOSIS — I5022 Chronic systolic (congestive) heart failure: Secondary | ICD-10-CM

## 2023-05-14 ENCOUNTER — Inpatient Hospital Stay: Payer: Medicaid Other | Admitting: Neurology

## 2023-05-14 DIAGNOSIS — I509 Heart failure, unspecified: Secondary | ICD-10-CM | POA: Diagnosis not present

## 2023-05-15 ENCOUNTER — Other Ambulatory Visit: Payer: Self-pay | Admitting: Internal Medicine

## 2023-05-15 DIAGNOSIS — I509 Heart failure, unspecified: Secondary | ICD-10-CM | POA: Diagnosis not present

## 2023-05-15 NOTE — Telephone Encounter (Signed)
Medication Refill - Medication: torsemide (DEMADEX) 20 MG tablet   Has the patient contacted their pharmacy? No.  Preferred Pharmacy (with phone number or street name):  [id number] - [place], [place] - [address] Phone: [phone]  Fax: [phone]     Has the patient been seen for an appointment in the last year OR does the patient have an upcoming appointment? Yes.    Agent: Please be advised that RX refills may take up to 3 business days. We ask that you follow-up with your pharmacy.

## 2023-05-16 ENCOUNTER — Ambulatory Visit: Payer: Self-pay

## 2023-05-16 MED ORDER — TORSEMIDE 20 MG PO TABS: 20.0000 mg | ORAL_TABLET | Freq: Every day | ORAL | 0 refills | Status: DC | PRN

## 2023-05-16 NOTE — Telephone Encounter (Signed)
Message from [place] E sent at 05/16/2023 12:27 PM EDT  Summary: Swelling without torsemide   Pt called to check status of refill request for torsemide. She says she only has two left, does not want to go into the weekend without enough because she says she has swelling building that she knows will get worse without torsemide.  Best contact: [phone]  Also is a heart patient, has to be very careful with fluid build up.         Chief Complaint: asking about status of refill of torsemide Disposition: [] ED /[] Urgent Care (no appt availability in office) / [] Appointment(In office/virtual)/ []  [facility]/ [x] Home Care/ [] Refused Recommended Disposition /[] [facility]/ []  Follow-up with PCP Additional Notes: called [facility] and was advised there are 5 refills left on torsemide. Called pt and advised. Pt verbalized understanding.  Reason for Disposition  Caller has medicine question only, adult not sick, AND triager answers question  Answer Assessment - Initial Assessment Questions 1. NAME of MEDICINE: "What medicine(s) are you calling about?"     torsemide 2. QUESTION: "What is your question?" (e.g., double dose of medicine, side effect)     Wanting refill  Protocols used: Medication Question Call-A-AH

## 2023-05-16 NOTE — Telephone Encounter (Signed)
Requested medications are due for refill today.  Pt should have refills  Requested medications are on the active medications list.  yes  Last refill. 02/28/2023 #30 5 rf  Future visit scheduled.   yes  Notes to clinic.  Rx signed by Brynda Peon.    Requested Prescriptions  Pending Prescriptions Disp Refills   torsemide (DEMADEX) 20 MG tablet 30 tablet 5    Sig: Take 1 tablet (20 mg total) by mouth daily as needed (for increased edema).     Cardiovascular:  Diuretics - Loop Passed - 05/15/2023  3:19 PM      Passed - K in normal range and within 180 days    Potassium  Date Value Ref Range Status  04/11/2023 4.3 3.5 - 5.1 mmol/L Final         Passed - Ca in normal range and within 180 days    Calcium  Date Value Ref Range Status  04/11/2023 10.1 8.9 - 10.3 mg/dL Final   Calcium, Ion  Date Value Ref Range Status  02/16/2023 1.26 1.15 - 1.40 mmol/L Final         Passed - Na in normal range and within 180 days    Sodium  Date Value Ref Range Status  04/11/2023 138 135 - 145 mmol/L Final  03/24/2023 144 134 - 144 mmol/L Final         Passed - Cr in normal range and within 180 days    Creatinine, Ser  Date Value Ref Range Status  04/11/2023 0.85 0.44 - 1.00 mg/dL Final         Passed - Cl in normal range and within 180 days    Chloride  Date Value Ref Range Status  04/11/2023 104 98 - 111 mmol/L Final         Passed - Mg Level in normal range and within 180 days    Magnesium  Date Value Ref Range Status  02/17/2023 2.2 1.7 - 2.4 mg/dL Final    Comment:    Performed at [facility], [address] [place], [place] [address]         Passed - Last BP in normal range    BP Readings from Last 1 Encounters:  05/09/23 118/80         Passed - Valid encounter within last 6 months    Recent Outpatient Visits           1 week ago Mixed stress and urge urinary incontinence   [facility] Marcine Matar, MD   1 month ago  Vaginal itching    [facility] Marcine Matar, MD   1 month ago Administrative encounter   [facility] Marcine Matar, MD   2 months ago Establishing care with new doctor, encounter for   [facility] Marcine Matar, MD       Future Appointments             In 1 week Marcine Matar, MD [facility]   In 2 months Rollene Rotunda, MD [facility]   In 2 months Windell Norfolk, MD [facility]

## 2023-05-17 DIAGNOSIS — I509 Heart failure, unspecified: Secondary | ICD-10-CM | POA: Diagnosis not present

## 2023-05-18 DIAGNOSIS — I509 Heart failure, unspecified: Secondary | ICD-10-CM | POA: Diagnosis not present

## 2023-05-19 DIAGNOSIS — I509 Heart failure, unspecified: Secondary | ICD-10-CM | POA: Diagnosis not present

## 2023-05-20 DIAGNOSIS — I509 Heart failure, unspecified: Secondary | ICD-10-CM | POA: Diagnosis not present

## 2023-05-22 DIAGNOSIS — I509 Heart failure, unspecified: Secondary | ICD-10-CM | POA: Diagnosis not present

## 2023-05-22 DIAGNOSIS — I420 Dilated cardiomyopathy: Secondary | ICD-10-CM | POA: Diagnosis not present

## 2023-05-25 DIAGNOSIS — I509 Heart failure, unspecified: Secondary | ICD-10-CM | POA: Diagnosis not present

## 2023-05-26 ENCOUNTER — Ambulatory Visit: Payer: Medicaid Other | Admitting: Internal Medicine

## 2023-05-26 DIAGNOSIS — I509 Heart failure, unspecified: Secondary | ICD-10-CM | POA: Diagnosis not present

## 2023-05-27 ENCOUNTER — Other Ambulatory Visit: Payer: Self-pay

## 2023-05-27 DIAGNOSIS — I509 Heart failure, unspecified: Secondary | ICD-10-CM | POA: Diagnosis not present

## 2023-05-27 NOTE — Patient Outreach (Signed)
Telephone outreach to patient to obtain mRS was successfully completed. MRS= 3  Stephanie Reilly [facility] Care Management Assistant [phone]  

## 2023-05-28 DIAGNOSIS — I509 Heart failure, unspecified: Secondary | ICD-10-CM | POA: Diagnosis not present

## 2023-05-29 ENCOUNTER — Other Ambulatory Visit: Payer: Self-pay | Admitting: Medical Genetics

## 2023-05-29 DIAGNOSIS — I509 Heart failure, unspecified: Secondary | ICD-10-CM | POA: Diagnosis not present

## 2023-05-29 DIAGNOSIS — Z006 Encounter for examination for normal comparison and control in clinical research program: Secondary | ICD-10-CM

## 2023-05-30 DIAGNOSIS — I509 Heart failure, unspecified: Secondary | ICD-10-CM | POA: Diagnosis not present

## 2023-05-31 DIAGNOSIS — I509 Heart failure, unspecified: Secondary | ICD-10-CM | POA: Diagnosis not present

## 2023-06-01 DIAGNOSIS — I509 Heart failure, unspecified: Secondary | ICD-10-CM | POA: Diagnosis not present

## 2023-06-02 DIAGNOSIS — I509 Heart failure, unspecified: Secondary | ICD-10-CM | POA: Diagnosis not present

## 2023-06-03 ENCOUNTER — Ambulatory Visit (HOSPITAL_COMMUNITY)
Admission: RE | Admit: 2023-06-03 | Discharge: 2023-06-03 | Disposition: A | Discharge status: Home / Self Care | Payer: Medicaid Other | Source: Ambulatory Visit | Attending: Internal Medicine | Admitting: Internal Medicine

## 2023-06-03 ENCOUNTER — Encounter (HOSPITAL_COMMUNITY): Payer: Self-pay | Admitting: Cardiology

## 2023-06-03 ENCOUNTER — Ambulatory Visit (HOSPITAL_BASED_OUTPATIENT_CLINIC_OR_DEPARTMENT_OTHER)
Admission: RE | Admit: 2023-06-03 | Discharge: 2023-06-03 | Disposition: A | Discharge status: Home / Self Care | Payer: Medicaid Other | Source: Ambulatory Visit | Attending: Cardiology | Admitting: Cardiology

## 2023-06-03 VITALS — BP 104/68 | HR 79 | Wt 241.8 lb

## 2023-06-03 DIAGNOSIS — I472 Ventricular tachycardia, unspecified: Secondary | ICD-10-CM

## 2023-06-03 DIAGNOSIS — Z87891 Personal history of nicotine dependence: Secondary | ICD-10-CM | POA: Diagnosis not present

## 2023-06-03 DIAGNOSIS — I63411 Cerebral infarction due to embolism of right middle cerebral artery: Secondary | ICD-10-CM

## 2023-06-03 DIAGNOSIS — Q2112 Patent foramen ovale: Secondary | ICD-10-CM | POA: Diagnosis not present

## 2023-06-03 DIAGNOSIS — E119 Type 2 diabetes mellitus without complications: Secondary | ICD-10-CM | POA: Insufficient documentation

## 2023-06-03 DIAGNOSIS — I5022 Chronic systolic (congestive) heart failure: Secondary | ICD-10-CM | POA: Insufficient documentation

## 2023-06-03 DIAGNOSIS — Z7689 Persons encountering health services in other specified circumstances: Secondary | ICD-10-CM | POA: Diagnosis not present

## 2023-06-03 DIAGNOSIS — I340 Nonrheumatic mitral (valve) insufficiency: Secondary | ICD-10-CM | POA: Insufficient documentation

## 2023-06-03 LAB — ECHOCARDIOGRAM COMPLETE
AR max vel: 3.27 cm2
AV Area VTI: 3.25 cm2
AV Area mean vel: 3.08 cm2
AV Mean grad: 4.5 mmHg
AV Peak grad: 8.8 mmHg
Ao pk vel: 1.49 m/s
Calc EF: 39 %
S' Lateral: 4.8 cm
Single Plane A2C EF: 34.2 %
Single Plane A4C EF: 43.3 %

## 2023-06-03 LAB — BASIC METABOLIC PANEL
Anion gap: 9 (ref 5–15)
BUN: 10 mg/dL (ref 6–20)
CO2: 27 mmol/L (ref 22–32)
Calcium: 10 mg/dL (ref 8.9–10.3)
Chloride: 102 mmol/L (ref 98–111)
Creatinine, Ser: 0.76 mg/dL (ref 0.44–1.00)
GFR, Estimated: >60 mL/min (ref >60)
Glucose, Bld: 117 mg/dL — ABNORMAL HIGH (ref 70–99)
Potassium: 3.2 mmol/L — ABNORMAL LOW (ref 3.5–5.1)
Sodium: 138 mmol/L (ref 135–145)

## 2023-06-03 LAB — BRAIN NATRIURETIC PEPTIDE: B Natriuretic Peptide: 49.8 pg/mL (ref 0.0–100.0)

## 2023-06-03 MED ORDER — DIGOXIN 125 MCG PO TABS: 0.0625 mg | ORAL_TABLET | Freq: Every day | ORAL | Status: DC

## 2023-06-03 NOTE — Patient Instructions (Signed)
Medication Changes:  Decrease Digoxin to 1/2 tab Daily  Lab Work:  Labs done today, your results will be available in MyChart, we will contact you for abnormal readings.  Testing/Procedures:  none  Referrals:  none  Special Instructions // Education:  Do the following things EVERYDAY: Weigh yourself in the morning before breakfast. Write it down and keep it in a log. Take your medicines as prescribed Eat low salt foods--Limit salt (sodium) to 2000 mg per day.  Stay as active as you can everyday Limit all fluids for the day to less than 2 liters   Follow-Up in: 1 month   At the [facility], you and your health needs are our priority. We have a designated team specialized in the treatment of Heart Failure. This Care Team includes your primary Heart Failure Specialized Cardiologist (physician), Advanced Practice Providers (APPs- Physician Assistants and Nurse Practitioners), and Pharmacist who all work together to provide you with the care you need, when you need it.   You may see any of the following providers on your designated Care Team at your next follow up:  Dr. Arvilla Meres Dr. Marca Ancona Dr. Marcos Eke, NP Robbie Lis, [facility] [place], [place] Brynda Peon, NP Karle Plumber, PharmD   Please be sure to bring in all your medications bottles to every appointment.   Need to Contact us:  If you have any questions or concerns before your next appointment please send [place] a message through [place] or call our office at [phone].    TO LEAVE A MESSAGE FOR THE NURSE SELECT OPTION 2, PLEASE LEAVE A MESSAGE INCLUDING: YOUR NAME DATE OF BIRTH CALL BACK NUMBER REASON FOR CALL**this is important as we prioritize the call backs  YOU WILL RECEIVE A CALL BACK THE SAME DAY AS LONG AS YOU CALL BEFORE 4:00 PM

## 2023-06-03 NOTE — Progress Notes (Signed)
ADVANCED HEART FAILURE CLINIC NOTE  Referring Physician: Marcine Matar, MD  Primary Care: Marcine Matar, MD Primary Cardiologist:  HPI: Stephanie Reilly is a 52 y.o. female with hypertension, hyperlipidemia, type 2 diabetes, obesity and systolic heart failure presenting today to establish care.  Her history dates back to 02/15/23 when she was admitted to [facility] 2/2 stroke from occlusion of the proximal right M2.  She was sent to interventional radiology and intubated.  Immediately postintubation she had PEA arrest ultimately undergoing diagnostic cerebral angiogram and thrombectomy for the right M2 occlusion now status post complete recanalization.   Her cardiac history dates back to 02/07/2023 when she presented to the emergency department with chest pain and shortness of breath that started in March 2024.  During that admission patient had an echocardiogram that demonstrated EF of 20% with global hypokinesis but preserved RV function.  She underwent left heart cath which was nonsignificant and right heart cath that demonstrated preserved cardiac index.  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  She was discharged home on Entresto, Farxiga, and spironolactone and Toprol.  Interval hx:  Since her hospitalization she has been doing remarkably well. Her weights remain well controlled around 240lbs. She has lost roughly 20lbs. She remains very strict with a no sugar/low sodium diet. No longer having PND, LE edema, no chest pain; now able to walk to the mailbox with only minimal to no shortness of breath.   Activity level/exercise tolerance:  NYHA IIB Orthopnea:  Sleeps on 2 pillows Paroxysmal noctural dyspnea:  No Chest pain/pressure:  No Orthostatic lightheadedness:  No Palpitations:  No Lower extremity edema:  No Presyncope/syncope:  NO Cough:  No  Past Medical History:  Diagnosis Date   Anemia    Anxiety    Arthritis    Depression     Hypertension    Prediabetes     Current Outpatient Medications  Medication Sig Dispense Refill   apixaban (ELIQUIS) 5 MG TABS tablet Take 1 tablet (5 mg total) by mouth 2 (two) times daily. 60 tablet 2   Ascorbic Acid (VITAMIN C) 100 MG tablet Take 100 mg by mouth daily.     Biotin (BIOTIN MAXIMUM STRENGTH) 10 MG TABS Take 10 mg by mouth daily.     carvedilol (COREG) 12.5 MG tablet Take 1 tablet (12.5 mg total) by mouth 2 (two) times daily with a meal. 180 tablet 3   cetirizine (ZYRTEC) 10 MG tablet Take 10 mg by mouth daily.     dapagliflozin propanediol (FARXIGA) 10 MG TABS tablet Take 1 tablet (10 mg total) by mouth daily. 90 tablet 1   digoxin (LANOXIN) 0.125 MG tablet Take 1 tablet (0.125 mg total) by mouth daily. 90 tablet 1   fluticasone (FLONASE) 50 MCG/ACT nasal spray Place 1 spray into both nostrils daily.     polyethylene glycol (MIRALAX / GLYCOLAX) 17 g packet Take 17 g by mouth daily as needed. 30 packet 3   rosuvastatin (CRESTOR) 20 MG tablet Take 1 tablet (20 mg total) by mouth daily. 90 tablet 1   sacubitril-valsartan (ENTRESTO) 49-51 MG Take 1 tablet by mouth 2 (two) times daily. 180 tablet 3   senna-docusate (SENOKOT-S) 8.6-50 MG tablet Take 1 tablet by mouth at bedtime as needed for mild constipation. 30 tablet 0   spironolactone (ALDACTONE) 25 MG tablet Take 1 tablet (25 mg total) by mouth daily. 90 tablet 1   torsemide (DEMADEX) 20 MG tablet  Take 1 tablet (20 mg total) by mouth daily as needed (for increased edema). 30 tablet 0   No current facility-administered medications for this encounter.    Allergies  Allergen Reactions   Paroxetine Hcl Anaphylaxis   Prednisone Shortness Of Breath and Other (See Comments)    Confusion, "messes with mental," muscles feel weird, fatigue, insomnia   Chocolate Other (See Comments)    Unknown    Ibuprofen Other (See Comments)    Unknown    Iron Other (See Comments)    GI upset (reported 08/04/2017). Pt clarifies had  constipation, nausea.  Stool softeners did not help.    Latex Other (See Comments)    Unknown    Lisinopril Hives   Naproxen Other (See Comments)    Unknown    Tramadol Hives   Codeine Rash   Penicillins Rash   Serotonin Nausea Only, Swelling, Anxiety, Rash and Other (See Comments)    GI intolerence      Social History   Socioeconomic History   Marital status: Single    Spouse name: Not on file   Number of children: 3   Years of education: Not on file   Highest education level: Bachelor's degree (e.g., BA, AB, BS)  Occupational History   Occupation: self employed  Tobacco Use   Smoking status: Former    Current packs/day: 0.00    Average packs/day: 0.5 packs/day for 11.0 years (5.5 ttl pk-yrs)    Types: Cigarettes    Start date: 10/2010    Quit date: 10/2021    Years since quitting: 1.6    Passive exposure: Past   Smokeless tobacco: Never  Vaping Use   Vaping status: Never Used  Substance and Sexual Activity   Alcohol use: Never   Drug use: Yes    Types: Marijuana    Comment: routine use of CBD edibles   Sexual activity: Not on file  Other Topics Concern   Not on file  Social History Narrative   Not on file   Social Determinants of Health   Financial Resource Strain: Low Risk  (02/20/2023)   Overall Financial Resource Strain (CARDIA)    Difficulty of Paying Living Expenses: Not hard at all  Food Insecurity: No Food Insecurity (02/20/2023)   Hunger Vital Sign    Worried About Running Out of Food in the Last Year: Never true    Ran Out of Food in the Last Year: Never true  Transportation Needs: No Transportation Needs (02/20/2023)   PRAPARE - Administrator, Civil Service (Medical): No    Lack of Transportation (Non-Medical): No  Physical Activity: Insufficiently Active (02/20/2023)   Exercise Vital Sign    Days of Exercise per Week: 4 days    Minutes of Exercise per Session: 20 min  Stress: No Stress Concern Present (02/20/2023)   [employer] of Occupational Health - Occupational Stress Questionnaire    Feeling of Stress : Only a little  Social Connections: Moderately Isolated (02/20/2023)   Social Connection and Isolation Panel [NHANES]    Frequency of Communication with Friends and Family: More than three times a week    Frequency of Social Gatherings with Friends and Family: Once a week    Attends Religious Services: More than 4 times per year    Active Member of [employer] or Organizations: No    Attends Banker Meetings: Not on file    Marital Status: Never married  Intimate Partner Violence: Not At Risk (02/15/2023)  Humiliation, Afraid, Rape, and Kick questionnaire    Fear of Current or Ex-Partner: No    Emotionally Abused: No    Physically Abused: No    Sexually Abused: No      Family History  Problem Relation Age of Onset   Hypertension Mother    Heart failure Mother    COPD Mother    Diabetes Paternal Grandfather     PHYSICAL EXAM: Vitals:   06/03/23 0944  BP: 104/68  Pulse: 79  SpO2: 99%   GENERAL: Well nourished, well developed, and in no apparent distress at rest.  HEENT: Negative for arcus senilis or xanthelasma. There is no scleral icterus.  The mucous membranes are pink and moist.   NECK: Supple, No masses. Normal carotid upstrokes without bruits. No masses or thyromegaly.    CHEST: There are no chest wall deformities. There is no chest wall tenderness. Respirations are unlabored.  Lungs- CTA B/L CARDIAC:  JVP: 7 cm H2O         Normal S1, S2  Normal rate with regular rhythm. No murmurs, rubs or gallops.  Pulses are 2+ and symmetrical in upper and lower extremities. No edema.  ABDOMEN: Soft, non-tender, non-distended. There are no masses or hepatomegaly. There are normal bowel sounds.  EXTREMITIES: Warm and well perfused with no cyanosis, clubbing.  LYMPHATIC: No axillary or supraclavicular lymphadenopathy.  NEUROLOGIC: Patient is oriented x3 with no focal or lateralizing  neurologic deficits.  PSYCH: Patients affect is appropriate, there is no evidence of anxiety or depression.  SKIN: Warm and dry; no lesions or wounds.   DATA REVIEW  ECG: ***  As per my personal interpretation  ECHO: 02/16/23: LVEF<20%, RV mildly reduced.   CATH: 02/10/23 Normal coronary anatomy Mild LV filling pressures. PCWP 23/21 with mean 20 mm Hg. LVEDP 21 mm Hg Mild pulmonary HTN PAP 42/22 with mean 30 mm Hg Cardiac output 4.81 L/min with index 2.26.  CMR: 02/11/23 1. Severe LV dilatation, normal wall thickness, and severe systolic dysfunction (EF 12%)  2.  Normal RV size with moderate systolic dysfunction (EF 31%)  3. Subendocardial LGE in focal region of mid inferior wall. Subendocardial LGE is typically ischemic in etiology and this could represent a small infarct. However, sarcoidosis can also cause subendocardial LGE. Recommend cardiac FDG PET to evaluate for sarcoid.  4. RV insertion site LGE, which is a nonspecific scar pattern often seen in setting of elevated pulmonary pressures   ASSESSMENT & PLAN:  Heart Failure with reduced EF Etiology of FB:PZWCHENIDPO cardiomyopathy NYHA class / AHA Stage:NYHA IIB Volume status & Diuretics: Euvolemic; torsemide 20mg  PRN only.  Vasodilators:Entresto 24/26mg  BID Beta-Blocker:coreg 12.5mg  BID; decrease digoxin to 0.0625 with plan to discontinue at follow up.  EUM:[id number] 25mg  daily Cardiometabolic:Farxiga 10mg  Devices therapies & Valvulopathies: improvement in LV function to 30% today; will D/C lifevest.  Advanced therapies:not currently indicated.   2. Right M2 stroke - s/p mechanical thrombectomy with small SAH - Suspect cardio embolic  - On Eliquis + statin.  - No residual deficits.  - No bleeding issues.   3. DMII - Hgb A1C 6.1  - On Farxiga  - Referred to PharmD for GLP1    4. NSVT /PVCs  - EP saw during recent admit, PVC burden not high enough to warrant amiodarone. - Continue Lifevest - Labs  today.  Ama Mcmaster Advanced Heart Failure Mechanical Circulatory Support

## 2023-06-04 ENCOUNTER — Ambulatory Visit: Payer: Medicaid Other | Attending: Internal Medicine | Admitting: Physician Assistant

## 2023-06-04 ENCOUNTER — Encounter (HOSPITAL_COMMUNITY): Payer: Self-pay

## 2023-06-04 ENCOUNTER — Telehealth (HOSPITAL_COMMUNITY): Payer: Self-pay | Admitting: Cardiology

## 2023-06-04 ENCOUNTER — Encounter: Payer: Self-pay | Admitting: Physician Assistant

## 2023-06-04 VITALS — BP 116/80 | HR 83 | Ht 62.0 in | Wt 240.0 lb

## 2023-06-04 DIAGNOSIS — F32A Depression, unspecified: Secondary | ICD-10-CM | POA: Diagnosis not present

## 2023-06-04 DIAGNOSIS — E118 Type 2 diabetes mellitus with unspecified complications: Secondary | ICD-10-CM | POA: Diagnosis not present

## 2023-06-04 DIAGNOSIS — F419 Anxiety disorder, unspecified: Secondary | ICD-10-CM

## 2023-06-04 DIAGNOSIS — Z7984 Long term (current) use of oral hypoglycemic drugs: Secondary | ICD-10-CM | POA: Diagnosis not present

## 2023-06-04 DIAGNOSIS — I509 Heart failure, unspecified: Secondary | ICD-10-CM | POA: Diagnosis not present

## 2023-06-04 DIAGNOSIS — Z7689 Persons encountering health services in other specified circumstances: Secondary | ICD-10-CM | POA: Diagnosis not present

## 2023-06-04 DIAGNOSIS — I5022 Chronic systolic (congestive) heart failure: Secondary | ICD-10-CM

## 2023-06-04 MED ORDER — SPIRONOLACTONE 50 MG PO TABS: 50.0000 mg | ORAL_TABLET | Freq: Every day | ORAL | 11 refills | Status: DC

## 2023-06-04 MED ORDER — HYDROXYZINE PAMOATE 25 MG PO CAPS
ORAL_CAPSULE | ORAL | 0 refills | Status: DC
Start: 2023-06-04 — End: 2023-07-22

## 2023-06-04 NOTE — Telephone Encounter (Signed)
Patient called.  Patient aware.  

## 2023-06-04 NOTE — Telephone Encounter (Signed)
-----   Message from [facility] sent at 06/04/2023  9:06 AM EDT ----- Can we please ask her to increase spironolactone to 50mg  daily with repeat BMP in 1 week.

## 2023-06-04 NOTE — Patient Instructions (Signed)

## 2023-06-04 NOTE — Progress Notes (Signed)
Patient ID: Stephanie Reilly, female   DOB: 1971/04/13, 52 y.o.   MRN: [id number]   Stephanie Reilly, is a 52 y.o. female  [id number]  [id number]  DOB - 1971/06/19  Chief Complaint  Patient presents with   Anxiety       Subjective:   Stephanie Reilly is a 52 y.o. female here today for options for anxiety depression.  She says she is doing much better overall with this.  She has been using prayer, meditation, deep breathing exercises which have helped her a lot.  She is wondering if she can use CBD oil or something like that if she has an "anxiety attack" that is refractory to the other methods.  She says this occurs infrequently.  No SI/HI  Seen by cardiology yesterday.  K+ was a little low-they increased her spironolactone and are rechecking BMP in 1 week.  Her heart condition is improving.    No problems updated.  ALLERGIES: Allergies  Allergen Reactions   Paroxetine Hcl Anaphylaxis   Prednisone Shortness Of Breath and Other (See Comments)    Confusion, "messes with mental," muscles feel weird, fatigue, insomnia   Chocolate Other (See Comments)    Unknown    Ibuprofen Other (See Comments)    Unknown    Iron Other (See Comments)    GI upset (reported 08/04/2017). Pt clarifies had constipation, nausea.  Stool softeners did not help.    Latex Other (See Comments)    Unknown    Lisinopril Hives   Naproxen Other (See Comments)    Unknown    Tramadol Hives   Codeine Rash   Penicillins Rash   Serotonin Nausea Only, Swelling, Anxiety, Rash and Other (See Comments)    GI intolerence    PAST MEDICAL HISTORY: Past Medical History:  Diagnosis Date   Anemia    Anxiety    Arthritis    Depression    Hypertension    Prediabetes     MEDICATIONS AT HOME: Prior to Admission medications   Medication Sig Start Date End Date Taking? Authorizing Provider  apixaban (ELIQUIS) 5 MG TABS tablet Take 1 tablet (5 mg total) by mouth 2 (two) times daily. 02/21/23  Yes Marcine Matar, MD   Ascorbic Acid (VITAMIN C) 100 MG tablet Take 100 mg by mouth daily.   Yes [provider]  Biotin (BIOTIN MAXIMUM STRENGTH) 10 MG TABS Take 10 mg by mouth daily. 08/04/17  Yes [provider]  carvedilol (COREG) 12.5 MG tablet Take 1 tablet (12.5 mg total) by mouth 2 (two) times daily with a meal. 05/09/23  Yes Rollene Rotunda, MD  cetirizine (ZYRTEC) 10 MG tablet Take 10 mg by mouth daily.   Yes [provider]  dapagliflozin propanediol (FARXIGA) 10 MG TABS tablet Take 1 tablet (10 mg total) by mouth daily. 02/21/23  Yes Marcine Matar, MD  digoxin (LANOXIN) 0.125 MG tablet Take 0.5 tablets (0.0625 mg total) by mouth daily. 06/03/23  Yes Sabharwal, Aditya, DO  fluticasone (FLONASE) 50 MCG/ACT nasal spray Place 1 spray into both nostrils daily. 05/08/23  Yes [provider]  hydrOXYzine (VISTARIL) 25 MG capsule 1/2 to 1 tablet as needed for severe anxiety(use sparingly) 06/04/23  Yes Analena Gama, Marzella Schlein, PA-C  polyethylene glycol (MIRALAX / GLYCOLAX) 17 g packet Take 17 g by mouth daily as needed. 03/20/23  Yes Marcine Matar, MD  rosuvastatin (CRESTOR) 20 MG tablet Take 1 tablet (20 mg total) by mouth daily. 02/21/23  Yes Marcine Matar, MD  sacubitril-valsartan (ENTRESTO) 49-51 MG Take 1 tablet by mouth 2 (two) times daily. 03/14/23  Yes Rollene Rotunda, MD  senna-docusate (SENOKOT-S) 8.6-50 MG tablet Take 1 tablet by mouth at bedtime as needed for mild constipation. 02/20/23  Yes Hetty Blend C, NP  spironolactone (ALDACTONE) 25 MG tablet Take 1 tablet (25 mg total) by mouth daily. 02/21/23  Yes Marcine Matar, MD  torsemide (DEMADEX) 20 MG tablet Take 1 tablet (20 mg total) by mouth daily as needed (for increased edema). 05/16/23  Yes Marcine Matar, MD    ROS: Neg HEENT Neg resp Neg cardiac Neg GI Neg GU Neg MS Neg neuro  Objective:   Vitals:   06/04/23 1100  BP: 116/80  Pulse: 83  SpO2: 99%  Weight: 240 lb (108.9 kg)  Height: 5\' 2"   (1.575 m)   Exam General appearance : Awake, alert, not in any distress. Speech Clear. Not toxic looking HEENT: Atraumatic and Normocephalic Neck: Supple, no JVD. No cervical lymphadenopathy.  Chest: Good air entry bilaterally, CTAB.  No rales/rhonchi/wheezing CVS: S1 S2 regular, no murmurs.  Extremities: B/L Lower Ext shows no edema, both legs are warm to touch Neurology: Awake alert, and oriented X 3, CN II-XII intact, Non focal Skin: No Rash Cheerful affect.   Data Review Lab Results  Component Value Date   HGBA1C 6.1 (H) 02/15/2023    Assessment & Plan   1. Anxiety and depression Much improved with prayer, meditation, and deep breathing.  Can also try essential oils, soothing music, chamomile tea - hydrOXYzine (VISTARIL) 25 MG capsule; 1/2 to 1 tablet as needed for severe anxiety(use sparingly)  Dispense: 30 capsule; Refill: 0  2. Type 2 diabetes mellitus with complication, without long-term current use of insulin (HCC) Non fasting blood sugar yesterday was 117.  A1C =6.1 3 months ago - Microalbumin / creatinine urine ratio    Return in about 4 months (around 10/04/2023) for Dr Laural Benes for chronic conditions.  The patient was given clear instructions to go to ER or return to medical center if symptoms don't improve, worsen or new problems develop. The patient verbalized understanding. The patient was told to call to get lab results if they haven't heard anything in the next week.      [place]ian Co, PA-C [facility] [place], [place] [phone]   06/04/2023, 11:56 AM

## 2023-06-05 ENCOUNTER — Telehealth (INDEPENDENT_AMBULATORY_CARE_PROVIDER_SITE_OTHER): Payer: Self-pay | Admitting: Internal Medicine

## 2023-06-05 ENCOUNTER — Telehealth: Payer: Self-pay | Admitting: Internal Medicine

## 2023-06-05 DIAGNOSIS — I509 Heart failure, unspecified: Secondary | ICD-10-CM | POA: Diagnosis not present

## 2023-06-05 LAB — MICROALBUMIN / CREATININE URINE RATIO
Creatinine, Urine: 108 mg/dL
Microalb/Creat Ratio: 6 mg/g{creat} (ref 0–29)
Microalbumin, Urine: 6.1 ug/mL

## 2023-06-05 NOTE — Telephone Encounter (Signed)
[name] with [facility] is calling in because she needs clarification on pt's prescription. [name] says she needs to know the frequency will be taking the medication as well as it being changed to tablets over capsules. [name] says the medication comes in tablets. [name] says to ask for Adair Patter when returning the pharmacy's call hydrOXYzine (VISTARIL) 25 MG capsule [[id number]]

## 2023-06-05 NOTE — Telephone Encounter (Signed)
Pharmacy is requesting medication change.

## 2023-06-05 NOTE — Telephone Encounter (Signed)
Copied from CRM [id number]. Topic: General - Inquiry >> Jun 04, 2023  4:24 PM Stephanie Reilly wrote: Patient has called and stated she needs an appointment for HEP C Screening also requesting to have a foot examination at this visit, requesting Dr Laural Benes and wants this done together. Please call patient back @ #  [phone]

## 2023-06-06 ENCOUNTER — Telehealth (HOSPITAL_COMMUNITY): Payer: Self-pay | Admitting: *Deleted

## 2023-06-06 DIAGNOSIS — I509 Heart failure, unspecified: Secondary | ICD-10-CM | POA: Diagnosis not present

## 2023-06-06 NOTE — Telephone Encounter (Signed)
Cardiac PET scan denied. "Does not meet medical necessity per clinical notes".   No peer to peer or appeal offered.

## 2023-06-07 DIAGNOSIS — I509 Heart failure, unspecified: Secondary | ICD-10-CM | POA: Diagnosis not present

## 2023-06-08 DIAGNOSIS — Z419 Encounter for procedure for purposes other than remedying health state, unspecified: Secondary | ICD-10-CM | POA: Diagnosis not present

## 2023-06-08 DIAGNOSIS — I509 Heart failure, unspecified: Secondary | ICD-10-CM | POA: Diagnosis not present

## 2023-06-09 DIAGNOSIS — I509 Heart failure, unspecified: Secondary | ICD-10-CM | POA: Diagnosis not present

## 2023-06-10 ENCOUNTER — Telehealth (HOSPITAL_COMMUNITY): Payer: Self-pay | Admitting: Cardiology

## 2023-06-10 ENCOUNTER — Telehealth: Payer: Self-pay | Admitting: Internal Medicine

## 2023-06-10 DIAGNOSIS — I509 Heart failure, unspecified: Secondary | ICD-10-CM | POA: Diagnosis not present

## 2023-06-10 NOTE — Telephone Encounter (Signed)
Patient called to request gene connect labs done at upcoming lab appt with HF clinic and HEP C labs.  Advised without order HF clinic does not draw outside labs. Routine labs/screening labs are primary managed by PCP.

## 2023-06-10 NOTE — Telephone Encounter (Signed)
Patient canceled last appointment with me that was in August.  Give next available in person visit.

## 2023-06-10 NOTE — Telephone Encounter (Signed)
Patient called stated she no longer need or want the anxiety medication and request the provider contact the [facility] on [address] [place] to cancel the script.

## 2023-06-10 NOTE — Telephone Encounter (Signed)
Pt is now scheduled 07/25/2023 at 3:50pm (next available) for hep C screening and foot exam.

## 2023-06-10 NOTE — Telephone Encounter (Signed)
Noted  

## 2023-06-11 ENCOUNTER — Ambulatory Visit: Payer: Self-pay | Admitting: *Deleted

## 2023-06-11 ENCOUNTER — Telehealth: Payer: Self-pay | Admitting: Cardiology

## 2023-06-11 ENCOUNTER — Encounter (HOSPITAL_COMMUNITY): Admission: RE | Admit: 2023-06-11 | Payer: Medicaid Other | Source: Ambulatory Visit

## 2023-06-11 DIAGNOSIS — I509 Heart failure, unspecified: Secondary | ICD-10-CM | POA: Diagnosis not present

## 2023-06-11 MED ORDER — DAPAGLIFLOZIN PROPANEDIOL 10 MG PO TABS: 10.0000 mg | ORAL_TABLET | Freq: Every day | ORAL | 0 refills | Status: DC

## 2023-06-11 NOTE — Telephone Encounter (Signed)
Summary: Questions about Vaccines together   Patient called and stated that she is getting her Pneumonia vaccine done next week(somewhere outside of the office) and patient wants to know if she can take her covid vaccine with the Pneumonia vaccine as well? Please advise   Patients callback #[phone]      Chief Complaint: Vaccine Question Symptoms: NA Frequency: NA Pertinent Negatives: Patient denies NA Disposition: [] ED /[] Urgent Care (no appt availability in office) / [] Appointment(In office/virtual)/ []  [place] [facility]/ [] Home Care/ [] Refused Recommended Disposition /[] [place] [facility]/ []  Follow-up with PCP Additional Notes: Questioning if OK to receive vaccines together, advised OK to do so if no reactions to either in past. Verbalizes understanding.  Reason for Disposition  Caller has medicine question only, adult not sick, AND triager answers question  Answer Assessment - Initial Assessment Questions 1. NAME of MEDICINE: "What medicine(s) are you calling about?"     Vaccines 2. QUESTION: "What is your question?" (e.g., double dose of medicine, side effect)     Together  Protocols used: Medication Question Call-A-AH

## 2023-06-11 NOTE — Telephone Encounter (Signed)
Requested Prescriptions  Pending Prescriptions Disp Refills   dapagliflozin propanediol (FARXIGA) 10 MG TABS tablet 90 tablet 1    Sig: Take 1 tablet (10 mg total) by mouth daily.     Endocrinology:  Diabetes - SGLT2 Inhibitors Passed - 06/11/2023  5:03 PM      Passed - Cr in normal range and within 360 days    Creatinine, Ser  Date Value Ref Range Status  06/03/2023 0.76 0.44 - 1.00 mg/dL Final         Passed - HBA1C is between 0 and 7.9 and within 180 days    Hgb A1c MFr Bld  Date Value Ref Range Status  02/15/2023 6.1 (H) 4.8 - 5.6 % Final    Comment:    (NOTE) Pre diabetes:          5.7%-6.4%  Diabetes:              >6.4%  Glycemic control for   <7.0% adults with diabetes          Passed - eGFR in normal range and within 360 days    GFR, Estimated  Date Value Ref Range Status  06/03/2023 >60 >60 mL/min Final    Comment:    (NOTE) Calculated using the CKD-EPI Creatinine Equation (2021)    eGFR  Date Value Ref Range Status  03/24/2023 67 >59 mL/min/1.73 Final         Passed - Valid encounter within last 6 months    Recent Outpatient Visits           1 week ago Anxiety and depression   [facility] [place], [place], [place]   1 month ago Mixed stress and urge urinary incontinence   [place] [facility] Marcine Matar, MD   1 month ago Vaginal itching   [place] [facility] Marcine Matar, MD   2 months ago Administrative encounter   [facility] Marcine Matar, MD   3 months ago Establishing care with new doctor, encounter for   [facility] Marcine Matar, MD       Future Appointments             In 1 month Rollene Rotunda, MD [facility]   In 1 month Laural Benes, Binnie Rail, MD [facility]   In 1 month Windell Norfolk, MD [facility]

## 2023-06-11 NOTE — Telephone Encounter (Signed)
Called pharmacy they stated that patient did not want to take so they closed it out and if you still wanted her on that type of medication that it needed to be tablet and not capsule.

## 2023-06-11 NOTE — Telephone Encounter (Signed)
Patient stated she will be getting a pneumonia shot on 9/7 and wants to also get a whooping cough or Hep B shot at the same time and wants to know if this will be safe for her.

## 2023-06-11 NOTE — Telephone Encounter (Signed)
Noted  

## 2023-06-11 NOTE — Telephone Encounter (Addendum)
Patient called about getting vaccine at the same time. Did inform her Tdap (whooping cough) is done every 10 years. Hep B is a series. Informed she should discuss what vaccine she needs with PCP.

## 2023-06-12 ENCOUNTER — Ambulatory Visit (HOSPITAL_COMMUNITY)
Admission: RE | Admit: 2023-06-12 | Discharge: 2023-06-12 | Disposition: A | Discharge status: Home / Self Care | Payer: Medicaid Other | Source: Ambulatory Visit | Attending: Cardiology | Admitting: Cardiology

## 2023-06-12 DIAGNOSIS — Z7689 Persons encountering health services in other specified circumstances: Secondary | ICD-10-CM | POA: Diagnosis not present

## 2023-06-12 DIAGNOSIS — I5022 Chronic systolic (congestive) heart failure: Secondary | ICD-10-CM | POA: Insufficient documentation

## 2023-06-12 LAB — BASIC METABOLIC PANEL
Anion gap: 7 (ref 5–15)
BUN: 15 mg/dL (ref 6–20)
CO2: 26 mmol/L (ref 22–32)
Calcium: 9.9 mg/dL (ref 8.9–10.3)
Chloride: 104 mmol/L (ref 98–111)
Creatinine, Ser: 0.83 mg/dL (ref 0.44–1.00)
GFR, Estimated: >60 mL/min (ref >60)
Glucose, Bld: 117 mg/dL — ABNORMAL HIGH (ref 70–99)
Potassium: 3.6 mmol/L (ref 3.5–5.1)
Sodium: 137 mmol/L (ref 135–145)

## 2023-06-13 ENCOUNTER — Other Ambulatory Visit: Payer: Self-pay | Admitting: Internal Medicine

## 2023-06-13 DIAGNOSIS — I509 Heart failure, unspecified: Secondary | ICD-10-CM | POA: Diagnosis not present

## 2023-06-13 NOTE — Telephone Encounter (Signed)
Requested Prescriptions  Pending Prescriptions Disp Refills   apixaban (ELIQUIS) 5 MG TABS tablet [Pharmacy Med Name: ELIQUIS 5MG  TABLETS] 180 tablet 0    Sig: TAKE 1 TABLET(5 MG) BY MOUTH TWICE DAILY     Hematology:  Anticoagulants - apixaban Passed - 06/13/2023  9:32 AM      Passed - PLT in normal range and within 360 days    Platelets  Date Value Ref Range Status  02/28/2023 333 150 - 400 K/uL Final         Passed - HGB in normal range and within 360 days    Hemoglobin  Date Value Ref Range Status  02/28/2023 13.7 12.0 - 15.0 g/dL Final   Total hemoglobin  Date Value Ref Range Status  02/18/2023 11.9 (L) 12.0 - 16.0 g/dL Final         Passed - HCT in normal range and within 360 days    HCT  Date Value Ref Range Status  02/28/2023 43.5 36.0 - 46.0 % Final         Passed - Cr in normal range and within 360 days    Creatinine, Ser  Date Value Ref Range Status  06/12/2023 0.83 0.44 - 1.00 mg/dL Final         Passed - AST in normal range and within 360 days    AST  Date Value Ref Range Status  02/17/2023 17 15 - 41 U/L Final         Passed - ALT in normal range and within 360 days    ALT  Date Value Ref Range Status  02/17/2023 21 0 - 44 U/L Final         Passed - Valid encounter within last 12 months    Recent Outpatient Visits           1 week ago Anxiety and depression   [place] [facility] [place], [place], [place]   1 month ago Mixed stress and urge urinary incontinence   [place] [facility] Marcine Matar, MD   2 months ago Vaginal itching   [place] [facility] Marcine Matar, MD   2 months ago Administrative encounter   [facility] Marcine Matar, MD   3 months ago Establishing care with new doctor, encounter for   [facility] Marcine Matar, MD       Future Appointments             In  1 month Rollene Rotunda, MD [facility]   In 1 month Laural Benes, Binnie Rail, MD [facility]   In 1 month Windell Norfolk, MD [facility]

## 2023-06-14 DIAGNOSIS — I509 Heart failure, unspecified: Secondary | ICD-10-CM | POA: Diagnosis not present

## 2023-06-15 DIAGNOSIS — I509 Heart failure, unspecified: Secondary | ICD-10-CM | POA: Diagnosis not present

## 2023-06-16 DIAGNOSIS — I509 Heart failure, unspecified: Secondary | ICD-10-CM | POA: Diagnosis not present

## 2023-06-17 ENCOUNTER — Other Ambulatory Visit (HOSPITAL_COMMUNITY)
Admission: RE | Admit: 2023-06-17 | Discharge: 2023-06-17 | Disposition: A | Discharge status: Home / Self Care | Payer: Medicaid Other | Source: Other Acute Inpatient Hospital | Attending: Physician Assistant | Admitting: Physician Assistant

## 2023-06-17 ENCOUNTER — Telehealth: Payer: Self-pay

## 2023-06-17 DIAGNOSIS — Z006 Encounter for examination for normal comparison and control in clinical research program: Secondary | ICD-10-CM | POA: Insufficient documentation

## 2023-06-17 DIAGNOSIS — Z7689 Persons encountering health services in other specified circumstances: Secondary | ICD-10-CM | POA: Diagnosis not present

## 2023-06-17 DIAGNOSIS — I509 Heart failure, unspecified: Secondary | ICD-10-CM | POA: Diagnosis not present

## 2023-06-17 NOTE — Telephone Encounter (Unsigned)
Copied from CRM [id number]. Topic: General - Other >> Jun 17, 2023  3:44 PM Dominique A wrote: Reason for CRM: Pt is calling to see if her PCP sent in the paper work from her diabetic eye exam with [facility] with Dr. Aletta Edouard. Pt states that it would need to be submitted to medicaid in order for her to keep being able to get her rewards so that she get things around the house that she is needing. Please advise.

## 2023-06-18 DIAGNOSIS — Z7689 Persons encountering health services in other specified circumstances: Secondary | ICD-10-CM | POA: Diagnosis not present

## 2023-06-18 DIAGNOSIS — I509 Heart failure, unspecified: Secondary | ICD-10-CM | POA: Diagnosis not present

## 2023-06-18 NOTE — Telephone Encounter (Signed)
Called & spoke to the patient. Verified name & DOB. Informed patient that she will need to ask for a copy of office notes from [facility] and then take that documentation to the department of social services. Patient expressed verbal understanding. No further questions at this time.

## 2023-06-19 ENCOUNTER — Ambulatory Visit: Payer: Medicaid Other | Admitting: Family Medicine

## 2023-06-19 DIAGNOSIS — I509 Heart failure, unspecified: Secondary | ICD-10-CM | POA: Diagnosis not present

## 2023-06-20 ENCOUNTER — Telehealth: Payer: Self-pay | Admitting: *Deleted

## 2023-06-20 DIAGNOSIS — I509 Heart failure, unspecified: Secondary | ICD-10-CM | POA: Diagnosis not present

## 2023-06-20 NOTE — Telephone Encounter (Signed)
Regan Lemming, MD  Dorthula Nettles, DO; Baird Lyons, RN  Thanks, let me know when the timing is right.       Previous Messages    ----- Message ----- From: Dorthula Nettles, DO Sent: 06/11/2023  11:38 AM EDT To: Regan Lemming, MD  Based on my last interaction with her she wishes to wait to see if LVEF improves further (up to 35%). I can see her one more time with an echo.  Adi ----- Message ----- From: Regan Lemming, MD Sent: 06/11/2023  11:33 AM EDT To: Dorthula Nettles, DO  You think we are good to go for ICD?

## 2023-06-21 DIAGNOSIS — I509 Heart failure, unspecified: Secondary | ICD-10-CM | POA: Diagnosis not present

## 2023-06-22 DIAGNOSIS — I509 Heart failure, unspecified: Secondary | ICD-10-CM | POA: Diagnosis not present

## 2023-06-23 DIAGNOSIS — I509 Heart failure, unspecified: Secondary | ICD-10-CM | POA: Diagnosis not present

## 2023-06-24 DIAGNOSIS — I509 Heart failure, unspecified: Secondary | ICD-10-CM | POA: Diagnosis not present

## 2023-06-25 DIAGNOSIS — I509 Heart failure, unspecified: Secondary | ICD-10-CM | POA: Diagnosis not present

## 2023-06-26 DIAGNOSIS — I509 Heart failure, unspecified: Secondary | ICD-10-CM | POA: Diagnosis not present

## 2023-06-27 DIAGNOSIS — I509 Heart failure, unspecified: Secondary | ICD-10-CM | POA: Diagnosis not present

## 2023-06-28 DIAGNOSIS — I509 Heart failure, unspecified: Secondary | ICD-10-CM | POA: Diagnosis not present

## 2023-06-29 DIAGNOSIS — I509 Heart failure, unspecified: Secondary | ICD-10-CM | POA: Diagnosis not present

## 2023-06-30 ENCOUNTER — Telehealth: Payer: Medicaid Other | Admitting: Physician Assistant

## 2023-06-30 ENCOUNTER — Ambulatory Visit: Payer: Self-pay | Admitting: *Deleted

## 2023-06-30 DIAGNOSIS — I509 Heart failure, unspecified: Secondary | ICD-10-CM | POA: Diagnosis not present

## 2023-06-30 DIAGNOSIS — B379 Candidiasis, unspecified: Secondary | ICD-10-CM

## 2023-06-30 MED ORDER — FLUCONAZOLE 150 MG PO TABS
150.0000 mg | ORAL_TABLET | Freq: Once | ORAL | 0 refills | Status: AC
Start: 2023-06-30 — End: 2023-06-30

## 2023-06-30 NOTE — Telephone Encounter (Signed)
  Chief Complaint: yeast infection Symptoms: yeast infection- burning ,discahrge Frequency: 3 days Pertinent Negatives: Patient denies abdominal pain  Disposition: [] ED /[] Urgent Care (no appt availability in office) / [] Appointment(In office/virtual)/ []  [facility]/ [] Home Care/ [] Refused Recommended Disposition /[x] [facility]/ []  Follow-up with PCP Additional Notes: No open appointment in office- virtual visit scheduled at mobile unit

## 2023-06-30 NOTE — Telephone Encounter (Signed)
Noted  

## 2023-06-30 NOTE — Telephone Encounter (Signed)
Summary: rx req / personal discomfort   The patient has recently began to experience personal discomfort they believe is related to their torsemide (DEMADEX) 20 MG tablet [[id number]] prescription  The patient has had itching, burning, discharge and odor  The patient shares that they have experienced symptoms for roughly three days  The patient would like to be prescribed something for their symptoms  Please contact further when possible         Reason for Disposition  [1] Symptoms of a "yeast infection" (i.e., itchy, white discharge, not bad smelling) AND [2] not improved > 3 days following Care Advice  Answer Assessment - Initial Assessment Questions 1. DISCHARGE: "Describe the discharge." (e.g., white, yellow, green, gray, foamy, cottage cheese-like)     Yes- watery discharge, yeast smell 2. ODOR: "Is there a bad odor?"     Yeast odor 3. ONSET: "When did the discharge begin?"     Started Saturday 4. RASH: "Is there a rash in the genital area?" If Yes, ask: "Describe it." (e.g., redness, blisters, sores, bumps)     No bumps- burning,itching 5. ABDOMEN PAIN: "Are you having any abdomen pain?" If Yes, ask: "What does it feel like? " (e.g., crampy, dull, intermittent, constant)      no 6. ABDOMEN PAIN SEVERITY: If present, ask: "How bad is it?" (e.g., Scale 1-10; mild, moderate, or severe)   - MILD (1-3): Doesn't interfere with normal activities, abdomen soft and not tender to touch.    - MODERATE (4-7): Interferes with normal activities or awakens from sleep, abdomen tender to touch.    - SEVERE (8-10): Excruciating pain, doubled over, unable to do any normal activities. (R/O peritonitis)      no 7. CAUSE: "What do you think is causing the discharge?" "Have you had the same problem before? What happened then?"     yeast 8. OTHER SYMPTOMS: "Do you have any other symptoms?" (e.g., fever, itching, vaginal bleeding, pain with urination, injury to genital area, vaginal foreign body)      Discharge, burning  Protocols used: Vaginal Discharge-A-AH

## 2023-06-30 NOTE — Progress Notes (Signed)
Patient ID: Stephanie Reilly, female   DOB: Nov 06, 1970, 52 y.o.   MRN: [id number] Virtual Visit via Video Note  I connected with Stephanie Reilly on 06/30/23 at  9:00 AM EDT by a video enabled telemedicine application and verified that I am speaking with the correct person using two identifiers.  Location: Patient: home Provider: [facility]   I discussed the limitations of evaluation and management by telemedicine and the availability of in person appointments. The patient expressed understanding and agreed to proceed.  History of Present Illness:  Stephanie Reilly is c/o 3 day vaginal itching and burning with vaginal discharge.  She denies dysuria/pelvic pain.  No fever.  Has prediabetes.  No recent antibiotics  Observations/Objective:  NAD.  A&Ox3.  Breathing non-labored   Assessment and Plan: 1. Yeast infection - fluconazole (DIFLUCAN) 150 MG tablet; Take 1 tablet (150 mg total) by mouth once for 1 dose. Repeat dose in 5 days  Dispense: 2 tablet; Refill: 0    Follow Up Instructions: If symptoms worsen/change follow up at [facility] or in clinic   I discussed the assessment and treatment plan with the patient. The patient was provided an opportunity to ask questions and all were answered. The patient agreed with the plan and demonstrated an understanding of the instructions.   The patient was advised to call back or seek an in-person evaluation if the symptoms worsen or if the condition fails to improve as anticipated.  I provided 12 minutes of non-face-to-face time during this encounter.   Georgian Co, PA-C

## 2023-07-01 DIAGNOSIS — I509 Heart failure, unspecified: Secondary | ICD-10-CM | POA: Diagnosis not present

## 2023-07-01 LAB — HELIX MOLECULAR SCREEN: Genetic Analysis Overall Interpretation: NEGATIVE

## 2023-07-02 DIAGNOSIS — I509 Heart failure, unspecified: Secondary | ICD-10-CM | POA: Diagnosis not present

## 2023-07-03 DIAGNOSIS — I509 Heart failure, unspecified: Secondary | ICD-10-CM | POA: Diagnosis not present

## 2023-07-04 ENCOUNTER — Encounter (HOSPITAL_COMMUNITY): Payer: Medicaid Other | Admitting: Cardiology

## 2023-07-04 DIAGNOSIS — I509 Heart failure, unspecified: Secondary | ICD-10-CM | POA: Diagnosis not present

## 2023-07-05 DIAGNOSIS — I509 Heart failure, unspecified: Secondary | ICD-10-CM | POA: Diagnosis not present

## 2023-07-06 DIAGNOSIS — I509 Heart failure, unspecified: Secondary | ICD-10-CM | POA: Diagnosis not present

## 2023-07-07 DIAGNOSIS — I509 Heart failure, unspecified: Secondary | ICD-10-CM | POA: Diagnosis not present

## 2023-07-08 DIAGNOSIS — Z419 Encounter for procedure for purposes other than remedying health state, unspecified: Secondary | ICD-10-CM | POA: Diagnosis not present

## 2023-07-08 DIAGNOSIS — I509 Heart failure, unspecified: Secondary | ICD-10-CM | POA: Diagnosis not present

## 2023-07-09 DIAGNOSIS — I509 Heart failure, unspecified: Secondary | ICD-10-CM | POA: Diagnosis not present

## 2023-07-10 DIAGNOSIS — I5021 Acute systolic (congestive) heart failure: Secondary | ICD-10-CM | POA: Diagnosis not present

## 2023-07-10 DIAGNOSIS — R159 Full incontinence of feces: Secondary | ICD-10-CM | POA: Diagnosis not present

## 2023-07-10 DIAGNOSIS — R152 Fecal urgency: Secondary | ICD-10-CM | POA: Diagnosis not present

## 2023-07-10 DIAGNOSIS — I509 Heart failure, unspecified: Secondary | ICD-10-CM | POA: Diagnosis not present

## 2023-07-10 DIAGNOSIS — N3946 Mixed incontinence: Secondary | ICD-10-CM | POA: Diagnosis not present

## 2023-07-11 DIAGNOSIS — I509 Heart failure, unspecified: Secondary | ICD-10-CM | POA: Diagnosis not present

## 2023-07-12 DIAGNOSIS — I509 Heart failure, unspecified: Secondary | ICD-10-CM | POA: Diagnosis not present

## 2023-07-13 DIAGNOSIS — I509 Heart failure, unspecified: Secondary | ICD-10-CM | POA: Diagnosis not present

## 2023-07-14 DIAGNOSIS — I509 Heart failure, unspecified: Secondary | ICD-10-CM | POA: Diagnosis not present

## 2023-07-15 DIAGNOSIS — I509 Heart failure, unspecified: Secondary | ICD-10-CM | POA: Diagnosis not present

## 2023-07-16 DIAGNOSIS — I509 Heart failure, unspecified: Secondary | ICD-10-CM | POA: Diagnosis not present

## 2023-07-17 DIAGNOSIS — I509 Heart failure, unspecified: Secondary | ICD-10-CM | POA: Diagnosis not present

## 2023-07-17 NOTE — Progress Notes (Signed)
Cardiology Office Note:   Date:  07/18/2023  ID:  Stephanie Reilly, DOB 06/17/1971, MRN [id number] PCP: Marcine Matar, MD  [facility] Providers Cardiologist:  Rollene Rotunda, MD Electrophysiologist:  Will Jorja Loa, MD {  History of Present Illness:   Stephanie Reilly is a 52 y.o. female with hypertension, hyperlipidemia, type 2 diabetes, obesity and recently diagnosed systolic heart failure.  She was in the hospital with stroke and occlusion of the proximal right M2.  She had PEA arrest and was intubated.    EF was 20%.  Left heart cath demonstrated normal coronaries.    She was treated with Eliquis.  She was managed with meds as below.  He wore a life vest. .  She had an MRI and I reviewed these results.  The ejection fraction was said to be 12%.  The there was a small mid inferior wall subendocardial infarct possible and they could not exclude sarcoid.  A PET scan is ordered.  This was not done. Echo in May demonstrated an EF of less than 20%.    She has an echo ordered for later this month.   MRI previously demonstrated subendocardial LGE in the mid and small and sarcoid could not be excluded.  FDG PET has been ordered but was not done because of insurance problems.  The most recent EF was 30 - 35% on echo in August, 2024  Since I last saw her she is actually been doing well.  She has been walking.  She is not having any new shortness of breath, PND or orthopnea.  She really says she does not have any palpitations.  She will get short of breath if she walks mild to moderate distance on level ground (class II) but she is not describing other symptoms.  She is not having any weight gain or edema.  She has had some episodes of dizziness.  This has happened briefly and lasted for a few seconds.  She might get some mild orthostatic symptoms.  She is somewhat limited by knee pain and needs to see an orthopedist.  She is using a cane right now.  ROS: As stated in the HPI and negative for all  other systems.  Studies Reviewed:    EKG:   NA  Risk Assessment/Calculations:              Physical Exam:   VS:  BP 106/78 (BP Location: Right Arm, Patient Position: Sitting, Cuff Size: Large)   Pulse 90   Ht 5\' 2"  (1.575 m)   Wt 242 lb (109.8 kg)   SpO2 95%   BMI 44.26 kg/m    Wt Readings from Last 3 Encounters:  07/18/23 242 lb (109.8 kg)  06/04/23 240 lb (108.9 kg)  06/03/23 241 lb 12.8 oz (109.7 kg)     GEN: Well nourished, well developed in no acute distress NECK: No JVD; No carotid bruits CARDIAC: RRR, no murmurs, rubs, gallops RESPIRATORY:  Clear to auscultation without rales, wheezing or rhonchi  ABDOMEN: Soft, non-tender, non-distended EXTREMITIES:  No edema; No deformity   ASSESSMENT AND PLAN:   Heart Failure with reduced EF: She is approaching GDMT.  Today I am not going to increase her Entresto because her blood pressure is borderline and she is having a little dizziness.  However, she sees Dr. Gasper Lloyd soon and if we get the opportunity to build up on the [place] we can do that and continue the other therapies.  She has had improvement in her  ejection fraction.  She had her LifeVest removed and we can follow-up to make sure she does not have an indication in the future for ICD.  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  We suspect a cardioembolic source.  No change in therapy.  DMII: She was to be referred for GLP-1 RA and I will go ahead and make this referral to start 1 of these medicines.   NSVT /PVCs : She is not having any symptomatic palpitations.  No change in therapy.    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       Follow up [facility] as scheduled.  See me in about 3 months. Ilda Basset D appt.    Signed, Rollene Rotunda, MD

## 2023-07-18 ENCOUNTER — Encounter: Payer: Self-pay | Admitting: Cardiology

## 2023-07-18 ENCOUNTER — Ambulatory Visit: Payer: Medicaid Other | Attending: Cardiology | Admitting: Cardiology

## 2023-07-18 VITALS — BP 106/78 | HR 90 | Ht 62.0 in | Wt 242.0 lb

## 2023-07-18 DIAGNOSIS — D6869 Other thrombophilia: Secondary | ICD-10-CM

## 2023-07-18 DIAGNOSIS — E119 Type 2 diabetes mellitus without complications: Secondary | ICD-10-CM | POA: Diagnosis not present

## 2023-07-18 DIAGNOSIS — I472 Ventricular tachycardia, unspecified: Secondary | ICD-10-CM | POA: Diagnosis not present

## 2023-07-18 DIAGNOSIS — I5022 Chronic systolic (congestive) heart failure: Secondary | ICD-10-CM | POA: Diagnosis not present

## 2023-07-18 DIAGNOSIS — E118 Type 2 diabetes mellitus with unspecified complications: Secondary | ICD-10-CM

## 2023-07-18 DIAGNOSIS — E66813 Obesity, class 3: Secondary | ICD-10-CM

## 2023-07-18 DIAGNOSIS — I509 Heart failure, unspecified: Secondary | ICD-10-CM | POA: Diagnosis not present

## 2023-07-18 NOTE — Patient Instructions (Signed)
Follow-Up: At [facility], you and your health needs are our priority.  As part of our continuing mission to provide you with exceptional heart care, we have created designated Provider Care Teams.  These Care Teams include your primary Cardiologist (physician) and Advanced Practice Providers (APPs -  Physician Assistants and Nurse Practitioners) who all work together to provide you with the care you need, when you need it.  Your next appointment:   3 month(s)  Provider:   Rollene Rotunda, MD

## 2023-07-20 DIAGNOSIS — I509 Heart failure, unspecified: Secondary | ICD-10-CM | POA: Diagnosis not present

## 2023-07-21 DIAGNOSIS — I509 Heart failure, unspecified: Secondary | ICD-10-CM | POA: Diagnosis not present

## 2023-07-22 ENCOUNTER — Encounter (HOSPITAL_COMMUNITY): Payer: Self-pay | Admitting: Cardiology

## 2023-07-22 ENCOUNTER — Ambulatory Visit (HOSPITAL_COMMUNITY)
Admission: RE | Admit: 2023-07-22 | Discharge: 2023-07-22 | Disposition: A | Discharge status: Home / Self Care | Payer: Medicaid Other | Source: Ambulatory Visit | Attending: Cardiology | Admitting: Cardiology

## 2023-07-22 VITALS — BP 126/84 | HR 86 | Wt 244.0 lb

## 2023-07-22 DIAGNOSIS — E119 Type 2 diabetes mellitus without complications: Secondary | ICD-10-CM | POA: Insufficient documentation

## 2023-07-22 DIAGNOSIS — I4729 Other ventricular tachycardia: Secondary | ICD-10-CM

## 2023-07-22 DIAGNOSIS — I110 Hypertensive heart disease with heart failure: Secondary | ICD-10-CM | POA: Insufficient documentation

## 2023-07-22 DIAGNOSIS — F1290 Cannabis use, unspecified, uncomplicated: Secondary | ICD-10-CM | POA: Insufficient documentation

## 2023-07-22 DIAGNOSIS — Z5986 Financial insecurity: Secondary | ICD-10-CM | POA: Insufficient documentation

## 2023-07-22 DIAGNOSIS — E118 Type 2 diabetes mellitus with unspecified complications: Secondary | ICD-10-CM | POA: Diagnosis not present

## 2023-07-22 DIAGNOSIS — I639 Cerebral infarction, unspecified: Secondary | ICD-10-CM

## 2023-07-22 DIAGNOSIS — Z87891 Personal history of nicotine dependence: Secondary | ICD-10-CM | POA: Insufficient documentation

## 2023-07-22 DIAGNOSIS — Z8673 Personal history of transient ischemic attack (TIA), and cerebral infarction without residual deficits: Secondary | ICD-10-CM | POA: Insufficient documentation

## 2023-07-22 DIAGNOSIS — Z7689 Persons encountering health services in other specified circumstances: Secondary | ICD-10-CM | POA: Diagnosis not present

## 2023-07-22 DIAGNOSIS — Z7901 Long term (current) use of anticoagulants: Secondary | ICD-10-CM | POA: Diagnosis not present

## 2023-07-22 DIAGNOSIS — I5032 Chronic diastolic (congestive) heart failure: Secondary | ICD-10-CM

## 2023-07-22 DIAGNOSIS — Z5982 Transportation insecurity: Secondary | ICD-10-CM | POA: Diagnosis not present

## 2023-07-22 DIAGNOSIS — Z79899 Other long term (current) drug therapy: Secondary | ICD-10-CM | POA: Insufficient documentation

## 2023-07-22 DIAGNOSIS — E669 Obesity, unspecified: Secondary | ICD-10-CM | POA: Diagnosis not present

## 2023-07-22 DIAGNOSIS — Z8679 Personal history of other diseases of the circulatory system: Secondary | ICD-10-CM | POA: Insufficient documentation

## 2023-07-22 DIAGNOSIS — I5022 Chronic systolic (congestive) heart failure: Secondary | ICD-10-CM | POA: Diagnosis not present

## 2023-07-22 DIAGNOSIS — I472 Ventricular tachycardia, unspecified: Secondary | ICD-10-CM | POA: Diagnosis not present

## 2023-07-22 DIAGNOSIS — I493 Ventricular premature depolarization: Secondary | ICD-10-CM | POA: Insufficient documentation

## 2023-07-22 DIAGNOSIS — Z6841 Body Mass Index (BMI) 40.0 and over, adult: Secondary | ICD-10-CM | POA: Insufficient documentation

## 2023-07-22 DIAGNOSIS — I509 Heart failure, unspecified: Secondary | ICD-10-CM | POA: Diagnosis not present

## 2023-07-22 LAB — BASIC METABOLIC PANEL
Anion gap: 8 (ref 5–15)
BUN: 14 mg/dL (ref 6–20)
CO2: 25 mmol/L (ref 22–32)
Calcium: 10 mg/dL (ref 8.9–10.3)
Chloride: 104 mmol/L (ref 98–111)
Creatinine, Ser: 0.75 mg/dL (ref 0.44–1.00)
GFR, Estimated: >60 mL/min (ref >60)
Glucose, Bld: 108 mg/dL — ABNORMAL HIGH (ref 70–99)
Potassium: 4.6 mmol/L (ref 3.5–5.1)
Sodium: 137 mmol/L (ref 135–145)

## 2023-07-22 LAB — BRAIN NATRIURETIC PEPTIDE: B Natriuretic Peptide: 32.1 pg/mL (ref 0.0–100.0)

## 2023-07-22 MED ORDER — ENTRESTO 97-103 MG PO TABS: 1.0000 | ORAL_TABLET | Freq: Two times a day (BID) | ORAL | 3 refills | Status: DC

## 2023-07-22 NOTE — Progress Notes (Signed)
ADVANCED HEART FAILURE CLINIC NOTE  Referring Physician: Marcine Matar, MD  Primary Care: Marcine Matar, MD Primary Cardiologist: Dr. Antoine Poche  HPI: Stephanie Reilly is a 52 y.o. female with hypertension, hyperlipidemia, type 2 diabetes, obesity and systolic heart failure presenting today for follow up.  Her history dates back to 02/15/23 when she was admitted to [facility] 2/2 stroke from occlusion of the proximal right M2.  She was sent to interventional radiology and intubated.  Immediately postintubation she had PEA arrest ultimately undergoing diagnostic cerebral angiogram and thrombectomy for the right M2 occlusion now status post complete recanalization.   Her cardiac history dates back to 02/07/2023 when she presented to the emergency department with chest pain and shortness of breath that started in March 2024.  During that admission patient had an echocardiogram that demonstrated EF of 20% with global hypokinesis but preserved RV function.  She underwent left heart cath which was nonsignificant and right heart cath that demonstrated preserved cardiac index.  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  She was discharged home on Entresto, Farxiga, and spironolactone and Toprol.  Interval hx:  - She has been doing very well since her last visit; she has minimal shortness of breath but reports taht she does not 'exert herself much'. With excessive exertion she reports having shortness of breath for 5-10 minutes requiring at least 10-15 mins of rest.  - Continues to go on 20 minute walks 3x weekly.  - Complaint with all medications.  - Limited only by left knee arthritis now.   Activity level/exercise tolerance:  NYHA IIB Orthopnea:  Sleeps on 2 pillows Paroxysmal noctural dyspnea:  No Chest pain/pressure:  No Orthostatic lightheadedness:  No Palpitations:  No Lower extremity edema:  No Presyncope/syncope:  NO Cough:  No  Past Medical  History:  Diagnosis Date   Anemia    Anxiety    Arthritis    Depression    Hypertension    Prediabetes     Current Outpatient Medications  Medication Sig Dispense Refill   apixaban (ELIQUIS) 5 MG TABS tablet TAKE 1 TABLET(5 MG) BY MOUTH TWICE DAILY 180 tablet 0   Ascorbic Acid (VITAMIN C) 100 MG tablet Take 100 mg by mouth daily.     Biotin (BIOTIN MAXIMUM STRENGTH) 10 MG TABS Take 10 mg by mouth daily.     carvedilol (COREG) 12.5 MG tablet Take 1 tablet (12.5 mg total) by mouth 2 (two) times daily with a meal. 180 tablet 3   cetirizine (ZYRTEC) 10 MG tablet Take 10 mg by mouth daily.     dapagliflozin propanediol (FARXIGA) 10 MG TABS tablet Take 1 tablet (10 mg total) by mouth daily. 90 tablet 0   digoxin (LANOXIN) 0.125 MG tablet Take 0.5 tablets (0.0625 mg total) by mouth daily. (Patient not taking: Reported on 07/18/2023)     fluticasone (FLONASE) 50 MCG/ACT nasal spray Place 1 spray into both nostrils daily.     hydrOXYzine (VISTARIL) 25 MG capsule 1/2 to 1 tablet as needed for severe anxiety(use sparingly) 30 capsule 0   polyethylene glycol (MIRALAX / GLYCOLAX) 17 g packet Take 17 g by mouth daily as needed. 30 packet 3   rosuvastatin (CRESTOR) 20 MG tablet Take 1 tablet (20 mg total) by mouth daily. 90 tablet 1   sacubitril-valsartan (ENTRESTO) 49-51 MG Take 1 tablet by mouth 2 (two) times daily. 180 tablet 3   senna-docusate (SENOKOT-S) 8.6-50 MG tablet Take 1  tablet by mouth at bedtime as needed for mild constipation. 30 tablet 0   spironolactone (ALDACTONE) 50 MG tablet Take 1 tablet (50 mg total) by mouth daily. 30 tablet 11   torsemide (DEMADEX) 20 MG tablet Take 1 tablet (20 mg total) by mouth daily as needed (for increased edema). 30 tablet 0   No current facility-administered medications for this visit.    Allergies  Allergen Reactions   Paroxetine Hcl Anaphylaxis   Prednisone Shortness Of Breath and Other (See Comments)    Confusion, "messes with mental," muscles  feel weird, fatigue, insomnia   Chocolate Other (See Comments)    Unknown    Ibuprofen Other (See Comments)    Unknown    Iron Other (See Comments)    GI upset (reported 08/04/2017). Pt clarifies had constipation, nausea.  Stool softeners did not help.    Latex Other (See Comments)    Unknown    Lisinopril Hives   Naproxen Other (See Comments)    Unknown    Tramadol Hives   Codeine Rash   Penicillins Rash   Serotonin Nausea Only, Swelling, Anxiety, Rash and Other (See Comments)    GI intolerence      Social History   Socioeconomic History   Marital status: Single    Spouse name: Not on file   Number of children: 3   Years of education: Not on file   Highest education level: Associate degree: academic program  Occupational History   Occupation: self employed  Tobacco Use   Smoking status: Former    Current packs/day: 0.00    Average packs/day: 0.5 packs/day for 11.0 years (5.5 ttl pk-yrs)    Types: Cigarettes    Start date: 10/2010    Quit date: 10/2021    Years since quitting: 1.7    Passive exposure: Past   Smokeless tobacco: Never  Vaping Use   Vaping status: Never Used  Substance and Sexual Activity   Alcohol use: Never   Drug use: Yes    Types: Marijuana    Comment: routine use of CBD edibles   Sexual activity: Not on file  Other Topics Concern   Not on file  Social History Narrative   Not on file   Social Determinants of Health   Financial Resource Strain: High Risk (07/21/2023)   Overall Financial Resource Strain (CARDIA)    Difficulty of Paying Living Expenses: Hard  Food Insecurity: Food Insecurity Present (07/21/2023)   Hunger Vital Sign    Worried About Programme researcher, broadcasting/film/video in the Last Year: Often true    Ran Out of Food in the Last Year: Often true  Transportation Needs: Unmet Transportation Needs (07/21/2023)   PRAPARE - Administrator, Civil Service (Medical): Yes    Lack of Transportation (Non-Medical): Yes  Physical Activity:  Insufficiently Active (07/21/2023)   Exercise Vital Sign    Days of Exercise per Week: 3 days    Minutes of Exercise per Session: 20 min  Stress: Stress Concern Present (07/21/2023)   [employer] of Occupational Health - Occupational Stress Questionnaire    Feeling of Stress : Very much  Social Connections: Socially Isolated (07/21/2023)   Social Connection and Isolation Panel [NHANES]    Frequency of Communication with Friends and Family: Once a week    Frequency of Social Gatherings with Friends and Family: Never    Attends Religious Services: Never    Database administrator or Organizations: No    Attends Club or  Organization Meetings: Not on file    Marital Status: Never married  Intimate Partner Violence: Not At Risk (02/15/2023)   Humiliation, Afraid, Rape, and Kick questionnaire    Fear of Current or Ex-Partner: No    Emotionally Abused: No    Physically Abused: No    Sexually Abused: No      Family History  Problem Relation Age of Onset   Hypertension Mother    Heart failure Mother    COPD Mother    Diabetes Paternal Grandfather     PHYSICAL EXAM: Vitals:   07/22/23 1437  BP: 126/84  Pulse: 86  SpO2: 99%   GENERAL: Well nourished, well developed, and in no apparent distress at rest.  HEENT: Negative for arcus senilis or xanthelasma. There is no scleral icterus.  The mucous membranes are pink and moist.   NECK: Supple, No masses. Normal carotid upstrokes without bruits. No masses or thyromegaly.    CHEST: There are no chest wall deformities. There is no chest wall tenderness. Respirations are unlabored.  Lungs- CTA B/L CARDIAC:  JVP: 8 cm          Normal rate with regular rhythm. No murmurs, rubs or gallops.  Pulses are 2+ and symmetrical in upper and lower extremities. 1+ edema.  ABDOMEN: Soft, non-tender, non-distended. There are no masses or hepatomegaly. There are normal bowel sounds.  EXTREMITIES: Warm and well perfused with no cyanosis, clubbing.   LYMPHATIC: No axillary or supraclavicular lymphadenopathy.  NEUROLOGIC: Patient is oriented x3 with no focal or lateralizing neurologic deficits.  PSYCH: Patients affect is appropriate, there is no evidence of anxiety or depression.  SKIN: Warm and dry; no lesions or wounds.    DATA REVIEW  ECG: 03/14/23: sinus tachycardia  As per my personal interpretation  ECHO: 02/16/23: LVEF<20%, RV mildly reduced.  06/04/23: LVEF 35%.   CATH: 02/10/23 Normal coronary anatomy Mild LV filling pressures. PCWP 23/21 with mean 20 mm Hg. LVEDP 21 mm Hg Mild pulmonary HTN PAP 42/22 with mean 30 mm Hg Cardiac output 4.81 L/min with index 2.26.  CMR: 02/11/23 1. Severe LV dilatation, normal wall thickness, and severe systolic dysfunction (EF 12%)  2.  Normal RV size with moderate systolic dysfunction (EF 31%)  3. Subendocardial LGE in focal region of mid inferior wall. Subendocardial LGE is typically ischemic in etiology and this could represent a small infarct. However, sarcoidosis can also cause subendocardial LGE. Recommend cardiac FDG PET to evaluate for sarcoid.  4. RV insertion site LGE, which is a nonspecific scar pattern often seen in setting of elevated pulmonary pressures   ASSESSMENT & PLAN:  Heart Failure with reduced EF Etiology of ZO:XWRUEAVWUJW cardiomyopathy; cardiac PET ordered & pending.  NYHA class / AHA Stage:NYHA IIB Volume status & Diuretics: mildly hypervolemic; torsemide 20mg  PRN only. Take additional 20mg  today.  Vasodilators:Entresto 49/51mg  BID; increase ot 97/103mg  BID. Beta-Blocker:coreg 12.5mg  BID; now off digoxin.  JXB:[id number] 25mg  daily Cardiometabolic:Farxiga 10mg  Devices therapies & Valvulopathies: prior echo with improvement of LVEF to 30-35%; will continue to uptitrate GDMT.  Advanced therapies:not currently indicated.   2. Right M2 stroke - s/p mechanical thrombectomy with small SAH - Suspect cardio embolic  - On Eliquis + statin.  - No residual  deficits.  - No bleeding issues.   3. DMII - Hgb A1C 6.1  - On Farxiga  - Referred to PharmD for GLP1. Following up with them on 08/11/23.    4. NSVT /PVCs  - EP saw during recent admit, PVC burden  not high enough to warrant amiodarone. - Repeat labs today - No ectopy on exam today.   5. Obesity  - Body mass index is 44.63 kg/m. - Scheduled to see pharmacist on 08/11/23 to start GLP1RA  I spent 38 minutes caring for this patient today including face to face time, ordering and reviewing labs, reviewing records from Dr. Antoine Poche, seeing the patient, documenting in the record, and arranging follow ups.   [employer] [facility] Advanced Heart Failure Mechanical Circulatory Support

## 2023-07-22 NOTE — Patient Instructions (Addendum)
Medication Changes:  INCREASE ENTRESTO TO 97/103 MG TWICE DAILY   Lab Work:  Labs done today, your results will be available in MyChart, we will contact you for abnormal readings.  Testing/Procedures:  ECHO IN 3 MONTHS WITH NEXT APPOINTMENT   Follow-Up in: 1 month with pharmD as scheduled   3 MONTHS PLEASE CALL OUR OFFICE AROUND MID NOVEMBER TO GET SCHEDULED FOR YOUR APPOINTMENT. PHONE NUMBER IS [phone] OPTION 2   At the [facility], you and your health needs are our priority. We have a designated team specialized in the treatment of Heart Failure. This Care Team includes your primary Heart Failure Specialized Cardiologist (physician), Advanced Practice Providers (APPs- Physician Assistants and Nurse Practitioners), and Pharmacist who all work together to provide you with the care you need, when you need it.   You may see any of the following providers on your designated Care Team at your next follow up:  Dr. Arvilla Meres Dr. Marca Ancona Dr. Dorthula Nettles Dr. Theresia Bough Tonye Becket, NP Robbie Lis, [place] [facility] [place], [place] Brynda Peon, NP [place] Lee, NP Karle Plumber, PharmD   Please be sure to bring in all your medications bottles to every appointment.   Need to Contact us:  If you have any questions or concerns before your next appointment please send [place] a message through [place] or call our office at [phone].    TO LEAVE A MESSAGE FOR THE NURSE SELECT OPTION 2, PLEASE LEAVE A MESSAGE INCLUDING: YOUR NAME DATE OF BIRTH CALL BACK NUMBER REASON FOR CALL**this is important as we prioritize the call backs  YOU WILL RECEIVE A CALL BACK THE SAME DAY AS LONG AS YOU CALL BEFORE 4:00 PM

## 2023-07-23 DIAGNOSIS — I509 Heart failure, unspecified: Secondary | ICD-10-CM | POA: Diagnosis not present

## 2023-07-24 DIAGNOSIS — I509 Heart failure, unspecified: Secondary | ICD-10-CM | POA: Diagnosis not present

## 2023-07-25 ENCOUNTER — Ambulatory Visit: Payer: Medicaid Other | Attending: Internal Medicine | Admitting: Internal Medicine

## 2023-07-25 ENCOUNTER — Encounter: Payer: Self-pay | Admitting: Internal Medicine

## 2023-07-25 VITALS — BP 107/77 | HR 82 | Ht 62.0 in | Wt 246.6 lb

## 2023-07-25 DIAGNOSIS — I5042 Chronic combined systolic (congestive) and diastolic (congestive) heart failure: Secondary | ICD-10-CM | POA: Diagnosis not present

## 2023-07-25 DIAGNOSIS — F331 Major depressive disorder, recurrent, moderate: Secondary | ICD-10-CM

## 2023-07-25 DIAGNOSIS — F411 Generalized anxiety disorder: Secondary | ICD-10-CM

## 2023-07-25 DIAGNOSIS — G8929 Other chronic pain: Secondary | ICD-10-CM

## 2023-07-25 DIAGNOSIS — M25512 Pain in left shoulder: Secondary | ICD-10-CM

## 2023-07-25 DIAGNOSIS — R7303 Prediabetes: Secondary | ICD-10-CM | POA: Diagnosis not present

## 2023-07-25 DIAGNOSIS — I509 Heart failure, unspecified: Secondary | ICD-10-CM | POA: Diagnosis not present

## 2023-07-25 DIAGNOSIS — M170 Bilateral primary osteoarthritis of knee: Secondary | ICD-10-CM | POA: Diagnosis not present

## 2023-07-25 DIAGNOSIS — Z6841 Body Mass Index (BMI) 40.0 and over, adult: Secondary | ICD-10-CM | POA: Diagnosis not present

## 2023-07-25 DIAGNOSIS — E66813 Obesity, class 3: Secondary | ICD-10-CM | POA: Diagnosis not present

## 2023-07-25 LAB — POCT GLYCOSYLATED HEMOGLOBIN (HGB A1C): HbA1c, POC (controlled diabetic range): 6.3 % (ref 0.0–7.0)

## 2023-07-25 LAB — GLUCOSE, POCT (MANUAL RESULT ENTRY): POC Glucose: 94 mg/dL (ref 70–99)

## 2023-07-25 MED ORDER — WEGOVY 0.25 MG/0.5ML ~~LOC~~ SOAJ: 0.2500 mg | SUBCUTANEOUS | 1 refills | Status: DC

## 2023-07-25 NOTE — Patient Instructions (Signed)
Wegovy (Semaglutide Injection) (Weight Management) What is this medication? SEMAGLUTIDE (SEM a GLOO tide) promotes weight loss. It may also be used to maintain weight loss.  It works by decreasing appetite. It can be used to lower the risk of heart attack and stroke in people affected by excess weight. Changes to diet and exercise are often combined with this medication. This medicine may be used for other purposes; ask your health care provider or pharmacist if you have questions. COMMON BRAND NAME(S): WGNFAO What should I tell my care team before I take this medication? They need to know if you have any of these conditions: Diabetes Eye disease caused by diabetes History of depression or other mental health conditions Kidney disease Pancreatic disease Personal or family history of MEN 2, a condition that causes endocrine gland tumors Personal or family history of thyroid cancer Suicidal thoughts, plans, or attempt by you or a family member An unusual or allergic reaction to semaglutide, other medications, foods, dyes, or preservatives Pregnant or trying to get pregnant Breastfeeding How should I use this medication? This medication is injected under the skin. You will be taught how to prepare and give it. Take it as directed on the prescription label. It is given once every week (every 7 days). Keep taking it unless your care team tells you to stop. It is important that you put your used needles and pens in a special sharps container. Do not put them in a trash can. If you do not have a sharps container, call your pharmacist or care team to get one. A special MedGuide will be given to you by the pharmacist with each prescription and refill. Be sure to read this information carefully each time. This medication comes with INSTRUCTIONS FOR USE. Ask your pharmacist for directions on how to use this medication. Read the information carefully. Talk to your pharmacist or care team if you have  questions. Talk to your care team about the use of this medication in children. While it may be prescribed for children as young as 12 years for selected conditions, precautions do apply. Overdosage: If you think you have taken too much of this medicine contact a poison control center or emergency room at once. NOTE: This medicine is only for you. Do not share this medicine with others. What if I miss a dose? If you miss a dose and the next scheduled dose is more than 2 days away, take the missed dose as soon as possible. If you miss a dose and the next scheduled dose is less than 2 days away, do not take the missed dose. Take the next dose at your regular time. Do not take double or extra doses. If you miss your dose for 2 weeks or more, take the next dose at your regular time or call your care team to talk about how to restart this medication. What may interact with this medication? Insulin and other medications for diabetes This list may not describe all possible interactions. Give your health care provider a list of all the medicines, herbs, non-prescription drugs, or dietary supplements you use. Also tell them if you smoke, drink alcohol, or use illegal drugs. Some items may interact with your medicine. What should I watch for while using this medication? Visit your care team for regular checks on your progress. It may be some time before you see the benefit from this medication. Drink plenty of fluids while taking this medication. Check with your care team if you have  severe diarrhea, nausea, and vomiting, or if you sweat a lot. The loss of too much body fluid may make it dangerous for you to take this medication. This medication may affect blood sugar levels. Ask your care team if changes in diet or medications are needed if you have diabetes. Talk to your care team if you may be pregnant. Losing weight while pregnant is not advised and may cause harm to the fetus. Talk to your care team for more  information. What side effects may I notice from receiving this medication? Side effects that you should report to your care team as soon as possible: Allergic reactions--skin rash, itching, hives, swelling of the face, lips, tongue, or throat Change in vision Dehydration--increased thirst, dry mouth, feeling faint or lightheaded, headache, dark yellow or brown urine Gallbladder problems--severe stomach pain, nausea, vomiting, fever Heart palpitations--rapid, pounding, or irregular heartbeat Kidney injury--decrease in the amount of urine, swelling of the ankles, hands, or feet Pancreatitis--severe stomach pain that spreads to your back or gets worse after eating or when touched, fever, nausea, vomiting Thoughts of suicide or self-harm, worsening mood, feelings of depression Thyroid cancer--new mass or lump in the neck, pain or trouble swallowing, trouble breathing, hoarseness Side effects that usually do not require medical attention (report these to your care team if they continue or are bothersome): Diarrhea Loss of appetite Nausea Upset stomach This list may not describe all possible side effects. Call your doctor for medical advice about side effects. You may report side effects to FDA at 1-800-FDA-1088. Where should I keep my medication? Keep out of the reach of children and pets. Refrigeration (preferred): Store in the refrigerator. Do not freeze. Keep this medication in the original container until you are ready to take it. Get rid of any unused medication after the expiration date. Room temperature: If needed, prior to cap removal, the pen can be stored at room temperature for up to 28 days. Protect from light. If it is stored at room temperature, get rid of any unused medication after 28 days or after it expires, whichever is first. It is important to get rid of the medication as soon as you no longer need it or it is expired. You can do this in two ways: Take the medication to a  medication take-back program. Check with your pharmacy or law enforcement to find a location. If you cannot return the medication, follow the directions in the MedGuide. NOTE: This sheet is a summary. It may not cover all possible information. If you have questions about this medicine, talk to your doctor, pharmacist, or health care provider.  2024 Elsevier/Gold Standard (2022-12-25 00:00:00)

## 2023-07-25 NOTE — Progress Notes (Signed)
Patient ID: Stephanie Reilly, female    DOB: [birth year]  MRN: [id number]  CC: Diabetes (DM f/u. Herold Harms referral to a [facility] orth[facility] - L knees accumulating fluid /Requesting referral to BH/Requesting pneumonia & Hep C screening)   Subjective: Stephanie Reilly is a 52 y.o. female who presents for chronic ds management. 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  Tries to suppress the anx/dep; feels like she has no one to talk to about it.   Drinks soothing tea and tries to meditate. Constantly worries about everything.  Worries about her health, her family, germs, whether transportation will get to her on time when she has to leave the house, whether she will sleep well. Feels she has to be constantly moving.   Avoids watching sad movies because they trigger her depression.   Lives with her 71 yr old mother. Tried with Zoloft, Paxil, Prozac, Xanax before when she lived in [place].  Had reactions like shaking and bad mood with SSRIs.  Xanax made her sick.  Would like to get into behavioral health for some counseling.  Request RF to ortho for chronic LT shoulder pain.  No known injuries. Worse during inclement weather Also pain in knees LT>RT.  Dx with OA.  Saw ortho at [facility] 12/2022.  X-rays showed moderate OA changes BL.  Given steroid shot that made her feel SOB.  Does not wish to go back.  Would like to get plugged in with [facility].  Takes Tylenol and uses Salonpas patches on the knees with some relief.  PreDM/Obesity Lab Results  Component Value Date   HGBA1C 6.3 07/25/2023  On recent visit with her cardiologist, they discussed putting her on Ozempic.  She has an appointment November 4 with clinical pharmacist to get her started on Ozempic.  Does not feel that she overeats.  Reports that her  weight fluctuates.  Gets tired easily when trying to do simple things like combing her hair.  Thankful for having an aide.  CHF/cardiomyopathy: Saw her cardiologist recently.  She was continued on guideline directed therapy including Clifton Custard, spironolactone.  Also on torsemide.  Denies any shortness of breath at rest.  Shortness of breath if she tries to do too much.  No lower extremity edema.  No chest pains. 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) 02/15/2023   Shock circulatory (HCC) 02/15/2023   Iron deficiency anemia 02/09/2023   Heart failure (HCC) 02/08/2023   Hypokalemia 02/08/2023   Acute on chronic combined systolic and diastolic CHF (congestive heart failure) (HCC) 02/07/2023   Essential hypertension 02/07/2023   Diabetes mellitus type 2, diet-controlled (HCC) 02/07/2023   Elevated troponin 02/07/2023   Obesity, Class III, BMI 40-49.9 (morbid obesity) (HCC) 02/07/2023     Current Outpatient Medications on File Prior to Visit  Medication Sig Dispense Refill   apixaban (ELIQUIS) 5 MG TABS tablet TAKE 1 TABLET(5 MG) BY MOUTH TWICE DAILY 180 tablet 0   Ascorbic Acid (VITAMIN C) 100 MG tablet Take 100 mg by mouth daily.     Biotin (BIOTIN MAXIMUM STRENGTH) 10  MG TABS Take 10 mg by mouth daily.     carvedilol (COREG) 12.5 MG tablet Take 1 tablet (12.5 mg total) by mouth 2 (two) times daily with a meal. 180 tablet 3   cetirizine (ZYRTEC) 10 MG tablet Take 10 mg by mouth daily.     dapagliflozin propanediol (FARXIGA) 10 MG TABS tablet Take 1 tablet (10 mg total) by mouth daily. 90 tablet 0   digoxin (LANOXIN) 0.125 MG tablet Take 0.5 tablets (0.0625 mg total) by mouth daily.     fluticasone (FLONASE) 50 MCG/ACT  nasal spray Place 1 spray into both nostrils daily.     polyethylene glycol (MIRALAX / GLYCOLAX) 17 g packet Take 17 g by mouth daily as needed. 30 packet 3   rosuvastatin (CRESTOR) 20 MG tablet Take 1 tablet (20 mg total) by mouth daily. 90 tablet 1   sacubitril-valsartan (ENTRESTO) 97-103 MG Take 1 tablet by mouth 2 (two) times daily. 60 tablet 3   senna-docusate (SENOKOT-S) 8.6-50 MG tablet Take 1 tablet by mouth at bedtime as needed for mild constipation. 30 tablet 0   spironolactone (ALDACTONE) 50 MG tablet Take 1 tablet (50 mg total) by mouth daily. 30 tablet 11   torsemide (DEMADEX) 20 MG tablet Take 1 tablet (20 mg total) by mouth daily as needed (for increased edema). 30 tablet 0   No current facility-administered medications on file prior to visit.    Allergies  Allergen Reactions   Paroxetine Hcl Anaphylaxis   Prednisone Shortness Of Breath and Other (See Comments)    Confusion, "messes with mental," muscles feel weird, fatigue, insomnia   Chocolate Other (See Comments)    Unknown    Ibuprofen Other (See Comments)    Unknown    Iron Other (See Comments)    GI upset (reported 08/04/2017). Pt clarifies had constipation, nausea.  Stool softeners did not help.    Latex Other (See Comments)    Unknown    Lisinopril Hives   Naproxen Other (See Comments)    Unknown    Tramadol Hives   Codeine Rash   Penicillins Rash   Serotonin Nausea Only, Swelling, Anxiety, Rash and Other (See Comments)    GI intolerence    Social History   Socioeconomic History   Marital status: Single    Spouse name: Not on file   Number of children: 3   Years of education: Not on file   Highest education level: Associate degree: academic program  Occupational History   Occupation: self employed  Tobacco Use   Smoking status: Former    Current packs/day: 0.00    Average packs/day: 0.5 packs/day for 11.0 years (5.5 ttl pk-yrs)    Types: Cigarettes    Start date: 10/2010    Quit date: 10/2021     Years since quitting: 1.7    Passive exposure: Past   Smokeless tobacco: Never  Vaping Use   Vaping status: Never Used  Substance and Sexual Activity   Alcohol use: Never   Drug use: Yes    Types: Marijuana    Comment: routine use of CBD edibles   Sexual activity: Not on file  Other Topics Concern   Not on file  Social History Narrative   Not on file   Social Determinants of Health   Financial Resource Strain: High Risk (07/25/2023)   Overall Financial Resource Strain (CARDIA)    Difficulty of Paying Living Expenses: Hard  Food Insecurity: No Food Insecurity (07/25/2023)   Hunger Vital Sign  Worried About Programme researcher, broadcasting/film/video in the Last Year: Never true    Ran Out of Food in the Last Year: Never true  Recent Concern: Food Insecurity - Food Insecurity Present (07/21/2023)   Hunger Vital Sign    Worried About Running Out of Food in the Last Year: Often true    Ran Out of Food in the Last Year: Often true  Transportation Needs: No Transportation Needs (07/25/2023)   PRAPARE - Administrator, Civil Service (Medical): No    Lack of Transportation (Non-Medical): No  Recent Concern: Transportation Needs - Unmet Transportation Needs (07/21/2023)   PRAPARE - Transportation    Lack of Transportation (Medical): Yes    Lack of Transportation (Non-Medical): Yes  Physical Activity: Insufficiently Active (07/25/2023)   Exercise Vital Sign    Days of Exercise per Week: 3 days    Minutes of Exercise per Session: 20 min  Stress: Stress Concern Present (07/25/2023)   [employer] of Occupational Health - Occupational Stress Questionnaire    Feeling of Stress : Rather much  Social Connections: Socially Isolated (07/25/2023)   Social Connection and Isolation Panel [NHANES]    Frequency of Communication with Friends and Family: Once a week    Frequency of Social Gatherings with Friends and Family: Never    Attends Religious Services: Never    Database administrator or  Organizations: No    Attends Banker Meetings: Never    Marital Status: Never married  Intimate Partner Violence: Not At Risk (07/25/2023)   Humiliation, Afraid, Rape, and Kick questionnaire    Fear of Current or Ex-Partner: No    Emotionally Abused: No    Physically Abused: No    Sexually Abused: No    Family History  Problem Relation Age of Onset   Hypertension Mother    Heart failure Mother    COPD Mother    Diabetes Paternal Grandfather     Past Surgical History:  Procedure Laterality Date   ABDOMINAL HYSTERECTOMY     IR CT HEAD LTD  02/15/2023   IR PERCUTANEOUS ART THROMBECTOMY/INFUSION INTRACRANIAL INC DIAG ANGIO  02/15/2023   IR US GUIDE VASC ACCESS RIGHT  02/15/2023   RADIOLOGY WITH ANESTHESIA N/A 02/15/2023   Procedure: IR WITH ANESTHESIA;  Surgeon: Radiologist, Medication, MD;  Location: [facility] OR;  Service: Radiology;  Laterality: N/A;   RIGHT/LEFT HEART CATH AND CORONARY ANGIOGRAPHY N/A 02/10/2023   Procedure: RIGHT/LEFT HEART CATH AND CORONARY ANGIOGRAPHY;  Surgeon: [place], Peter M, MD;  Location: [facility] INVASIVE CV LAB;  Service: Cardiovascular;  Laterality: N/A;   TONSILLECTOMY     TUBAL LIGATION      ROS: Review of Systems Negative except as stated above  PHYSICAL EXAM: BP 107/77 (BP Location: Left Arm, Patient Position: Sitting, Cuff Size: Large)   Pulse 82   Ht 5\' 2"  (1.575 m)   Wt 246 lb 9.6 oz (111.9 kg)   SpO2 100%   BMI 45.10 kg/m   Wt Readings from Last 3 Encounters:  07/25/23 246 lb 9.6 oz (111.9 kg)  07/22/23 244 lb (110.7 kg)  07/18/23 242 lb (109.8 kg)    Physical Exam  General appearance - alert, well appearing, and in no distress Mental status - normal mood, behavior, speech, dress, motor activity, and thought processes Chest - clear to auscultation, no wheezes, rales or rhonchi, symmetric air entry Heart - normal rate, regular rhythm, normal S1, S2, no murmurs, rubs, clicks or gallops Musculoskeletal -left shoulder: She  has  difficulty elevating the arm more than 90 degrees.  Discomfort with passive range of motion in all directions. Knees: Large body habitus.  Tenderness on palpation of the medial and lateral joint lines of the left knee.  Moderate discomfort with attempted passive range of motion of the left knee. Extremities -no lower extremity edema.      07/25/2023    4:10 PM 06/04/2023   11:05 AM 04/14/2023    4:14 PM  Depression screen PHQ 2/9  Decreased Interest 2 2 2   Down, Depressed, Hopeless 3 2 2   PHQ - 2 Score 5 4 4   Altered sleeping 3 0 0  Tired, decreased energy 3 0 0  Change in appetite 2 0 0  Feeling bad or failure about yourself  2 2 0  Trouble concentrating 1 0 1  Moving slowly or fidgety/restless 2 0 0  Suicidal thoughts 0 0 0  PHQ-9 Score 18 6 5   Difficult doing work/chores Extremely dIfficult        07/25/2023    4:10 PM 06/04/2023   11:05 AM 02/21/2023    2:24 PM  GAD 7 : Generalized Anxiety Score  Nervous, Anxious, on Edge 3 2 1   Control/stop worrying 3 0 1  Worry too much - different things 3 0 0  Trouble relaxing 3 1 2   Restless 3 0 0  Easily annoyed or irritable 3 1 2   Afraid - awful might happen 3 0 0  Total GAD 7 Score 21 4 6   Anxiety Difficulty Extremely difficult Somewhat difficult         Latest Ref Rng & Units 07/22/2023    3:30 PM 06/12/2023    8:59 AM 06/03/2023   10:36 AM  CMP  Glucose 70 - 99 mg/dL 244  010  272   BUN 6 - 20 mg/dL 14  15  10    Creatinine 0.44 - 1.00 mg/dL 5.36  6.44  0.34   Sodium 135 - 145 mmol/L 137  137  138   Potassium 3.5 - 5.1 mmol/L 4.6  3.6  3.2   Chloride 98 - 111 mmol/L 104  104  102   CO2 22 - 32 mmol/L 25  26  27    Calcium 8.9 - 10.3 mg/dL 74.2  9.9  59.5    Lipid Panel     Component Value Date/Time   CHOL 155 02/16/2023 0945   TRIG 62 02/16/2023 0945   HDL 45 02/16/2023 0945   CHOLHDL 3.4 02/16/2023 0945   VLDL 12 02/16/2023 0945   LDLCALC 98 02/16/2023 0945    CBC    Component Value Date/Time   WBC 10.4  02/28/2023 1028   RBC 5.14 (H) 02/28/2023 1028   HGB 13.7 02/28/2023 1028   HCT 43.5 02/28/2023 1028   PLT 333 02/28/2023 1028   MCV 84.6 02/28/2023 1028   MCH 26.7 02/28/2023 1028   MCHC 31.5 02/28/2023 1028   RDW 15.3 02/28/2023 1028   LYMPHSABS 3.3 02/15/2023 1245   MONOABS 0.9 02/15/2023 1245   EOSABS 0.1 02/15/2023 1245   BASOSABS 0.1 02/15/2023 1245    ASSESSMENT AND PLAN: 1. Class 3 severe obesity with serious comorbidity and body mass index (BMI) of 45.0 to 49.9 in adult, unspecified obesity type (HCC) Encourage healthy eating habits. Discussed trying her with [facility].  Went over with her how the medication works and possible side effects including nausea, vomiting, diarrhea, constipation, bowel blockage and pancreatitis.  Advised to stop the medicine if she develops  any vomiting, severe diarrhea, abdominal pain or not able to pass her bowels.  We will start her on 0.25 mg once a week.  Clinical pharmacist met with her today to teach administration.  Will have her follow-up in 2 months to see how she is doing. - Semaglutide-Weight Management (WEGOVY) 0.25 MG/0.5ML SOAJ; Inject 0.25 mg into the skin once a week.  Dispense: 2 mL; Refill: 1  2. Prediabetes See #1 above. - POCT glycosylated hemoglobin (Hb A1C) - POCT glucose (manual entry) - Semaglutide-Weight Management (WEGOVY) 0.25 MG/0.5ML SOAJ; Inject 0.25 mg into the skin once a week.  Dispense: 2 mL; Refill: 1  3. Primary osteoarthritis of both knees Discussed the importance of weight loss to help take mechanical strain off the knees.  Continue Tylenol and Salonpas patches - AMB referral to orthopedics  4. Chronic left shoulder pain Frozen shoulder. - AMB referral to orthopedics  5. Major depressive disorder, recurrent episode, moderate (HCC) Discussed putting her on medication like Wellbutrin and referring to psychiatry.  Patient is very skeptical about medication as she did not tolerate quite a number of them in the  past.  She prefers to wait until she sees the psychiatrist. - Ambulatory referral to Psychiatry  6. GAD (generalized anxiety disorder) See #5 above. - Ambulatory referral to Psychiatry  7. Chronic combined systolic and diastolic CHF, NYHA class 2 (HCC) Compensated.  Continue torsemide 20 mg daily as needed, spironolactone 50 mg daily, Entresto 97/103 mg twice a day, Farxiga 10 mg daily, carvedilol 12.5 mg twice a day   Patient was given the opportunity to ask questions.  Patient verbalized understanding of the plan and was able to repeat key elements of the plan.   This documentation was completed using Paediatric nurse.  Any transcriptional errors are unintentional.  Orders Placed This Encounter  Procedures   Ambulatory referral to Psychiatry   AMB referral to orthopedics   POCT glycosylated hemoglobin (Hb A1C)   POCT glucose (manual entry)     Requested Prescriptions   Signed Prescriptions Disp Refills   Semaglutide-Weight Management (WEGOVY) 0.25 MG/0.5ML SOAJ 2 mL 1    Sig: Inject 0.25 mg into the skin once a week.    Return in about 2 months (around 09/24/2023).  Jonah Blue, MD, FACP

## 2023-07-26 DIAGNOSIS — I509 Heart failure, unspecified: Secondary | ICD-10-CM | POA: Diagnosis not present

## 2023-07-27 DIAGNOSIS — I509 Heart failure, unspecified: Secondary | ICD-10-CM | POA: Diagnosis not present

## 2023-07-28 ENCOUNTER — Encounter: Payer: Self-pay | Admitting: Neurology

## 2023-07-28 ENCOUNTER — Telehealth: Payer: Self-pay | Admitting: Cardiology

## 2023-07-28 ENCOUNTER — Ambulatory Visit (INDEPENDENT_AMBULATORY_CARE_PROVIDER_SITE_OTHER): Payer: Medicaid Other | Admitting: Neurology

## 2023-07-28 VITALS — BP 94/65 | HR 83 | Ht 62.0 in | Wt 240.0 lb

## 2023-07-28 DIAGNOSIS — I509 Heart failure, unspecified: Secondary | ICD-10-CM | POA: Diagnosis not present

## 2023-07-28 DIAGNOSIS — I63511 Cerebral infarction due to unspecified occlusion or stenosis of right middle cerebral artery: Secondary | ICD-10-CM | POA: Diagnosis not present

## 2023-07-28 NOTE — Patient Instructions (Signed)
Continue current medications Continue follow-up PCP Return in 1 year or sooner if worse

## 2023-07-28 NOTE — Telephone Encounter (Signed)
Pt reports that her PCP put her on Wegovy and no longer needs to meet with pharmacy

## 2023-07-28 NOTE — Progress Notes (Signed)
[facility]  PATIENT: Stephanie Reilly DOB: 11/24/1970  REQUESTING CLINICIAN: Lynnae January, NP HISTORY FROM: Patient and chart review  REASON FOR VISIT: Right MCA strokes   HISTORICAL  CHIEF COMPLAINT:  Chief Complaint  Patient presents with   Follow-up    Rm 12. Alone. Hospital follow up. She has no current concerns.    HISTORY OF PRESENT ILLNESS:  This is a 52 year old woman with past medical history of hypertension, hyperlipidemia, heart failure, who is presenting after being admitted to hospital in May for right MCA strokes.  Patient presented with slurred speech, facial droop which has resolved while she was in the ED.  CT angiogram showed a right M2 occlusion.  She was taken for thrombectomy with TICI 3 revascularization.  Unfortunately patient did have PEA on the table, she was admitted to the ICU and recovered very well without any deficit.  Since then, she has been doing well, compliant with her medications.  She reports her EF has improved from 10 to 15% to 35%.  She is no longer wearing the LifeVest.  She also reported losing some weight.  Currently no other complaint.  Hospital summary and course below.   HOSPITAL SUMMARY AND COURSE Stephanie Reilly is a 52 y.o. female with past medical history of HTN, HLD, anxiety and depression, newly diagnoses CHF, DM, IDA, and morbid obesity who presented to [facility], ED via EMS for acute onset of slurred speech and facial droop which had resolved prior to ED.  Neurological exam was fluctuating and code stroke was activated by EDP.  Code stroke CT head with no acute process.  Aspects 10.  CTA head with occlusion of proximal right M2.  NIH 2.  LKW 1230 Per daughter at the bedside she states that around 11 AM they were sitting at the table talking and all of a sudden her mom started making noises and could not get her words out and had a facial droop, which resolved within 1 minute.  They assisted her to the bedroom to lay  down in the bed and they noticed that again her facial droop had come back and now had slurred speech.  At this point they called EMS.   On arrival to the ED patient was noted to have a facial droop which then had resolved and then was noted to have a left-sided facial droop with slurred speech again in the emergency room and it was this point that they decided to activate a code stroke.    Patient with recent hospitalization and discharged on Feb 12, 2023 with newly diagnosed cardiomyopathy and congestive heart failure with EF<20%.  MRI heart showed EF 12%. She was started on ASA 81,  torsemide, Entresto, Aldactone and [place].    We will admit patient to ICU for close neurological monitoring, stat MRI and potential heparin IV if no large infarct on MRI.     LKW: 1230  tpa given?: no, symptoms resolved IR: no, transient symptoms, will need close monitoring Modified Rankin Scale: 0-Completely asymptomatic and back to baseline post- stroke   HOSPITAL COURSE [name] is a 52 y.o. female with history of hypertension, hyperlipidemia, anxiety, depression, CHF, diabetes and morbid obesity presenting with acute onset left facial droop and slurred speech.  Symptoms resolved, but patient was found to have right M2 occlusion on CTA.  After patient had recurrent symptoms, she was taken to IR for mechanical thrombectomy, which was successful.  However, patient coded upon induction with ROSC achieved  after 2 minutes.  She was extubated 5/13 and is clinically improved, off pressors.     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 ASPECTS 10.    CTA head & neck occlusion of proximal right M2 Post IR with TICI3 reperfusion CT contrast vs. SAH in right sylvian fissure MRI small acute infarcts in the right frontal, parietal, bilateral occipital lobes and right cerebellum MRA right  M2 patent now 2D Echo EF < 20% LDL 98 HgbA1c 6.1 VTE prophylaxis -Lovenox aspirin 81 mg daily prior to admission, now on Eliquis.  Therapy recommendations: None  OTHER MEDICAL CONDITIONS: HTN, HLD, anxiety and depression, newly diagnoses CHF, DM, IDA, and morbid obesity   REVIEW OF SYSTEMS: Full 14 system review of systems performed and negative with exception of: As noted in the HPI   ALLERGIES: Allergies  Allergen Reactions   Paroxetine Hcl Anaphylaxis   Prednisone Shortness Of Breath and Other (See Comments)    Confusion, "messes with mental," muscles feel weird, fatigue, insomnia   Chocolate Other (See Comments)    Unknown    Ibuprofen Other (See Comments)    Unknown    Iron Other (See Comments)    GI upset (reported 08/04/2017). Pt clarifies had constipation, nausea.  Stool softeners did not help.    Latex Other (See Comments)    Unknown    Lisinopril Hives   Naproxen Other (See Comments)    Unknown    Tramadol Hives   Codeine Rash   Penicillins Rash   Serotonin Nausea Only, Swelling, Anxiety, Rash and Other (See Comments)    GI intolerence    HOME MEDICATIONS: Outpatient Medications Prior to Visit  Medication Sig Dispense Refill   apixaban (ELIQUIS) 5 MG TABS tablet TAKE 1 TABLET(5 MG) BY MOUTH TWICE DAILY 180 tablet 0   Ascorbic Acid (VITAMIN C) 100 MG tablet Take 100 mg by mouth daily.     Biotin (BIOTIN MAXIMUM STRENGTH) 10 MG TABS Take 10 mg by mouth daily.     carvedilol (COREG) 12.5 MG tablet Take 1 tablet (12.5 mg total) by mouth 2 (two) times daily with a meal. 180 tablet 3   cetirizine (ZYRTEC) 10 MG tablet Take 10 mg by mouth daily.     dapagliflozin propanediol (FARXIGA) 10 MG TABS tablet Take 1 tablet (10 mg total) by mouth daily. 90 tablet 0   fluticasone (FLONASE) 50 MCG/ACT nasal spray Place 1 spray into both nostrils daily.     polyethylene glycol (MIRALAX / GLYCOLAX) 17 g packet Take 17 g by mouth daily as needed. 30 packet 3   rosuvastatin  (CRESTOR) 20 MG tablet Take 1 tablet (20 mg total) by mouth daily. 90 tablet 1   sacubitril-valsartan (ENTRESTO) 97-103 MG Take 1 tablet by mouth 2 (two) times daily. 60 tablet 3   Semaglutide-Weight Management (WEGOVY) 0.25 MG/0.5ML SOAJ Inject 0.25 mg into the skin once a week. 2 mL 1   senna-docusate (SENOKOT-S) 8.6-50 MG tablet Take 1 tablet by mouth at bedtime as needed for mild constipation. 30 tablet 0   spironolactone (ALDACTONE) 50 MG tablet Take 1 tablet (50 mg total) by mouth daily. 30 tablet 11   torsemide (DEMADEX) 20 MG tablet Take 1 tablet (20 mg total) by mouth daily as needed (for increased edema). 30 tablet 0   digoxin (LANOXIN) 0.125 MG tablet Take 0.5 tablets (0.0625 mg total) by mouth daily.  No facility-administered medications prior to visit.    PAST MEDICAL HISTORY: Past Medical History:  Diagnosis Date   Anemia    Anxiety    Arthritis    Depression    Hypertension    Prediabetes     PAST SURGICAL HISTORY: Past Surgical History:  Procedure Laterality Date   ABDOMINAL HYSTERECTOMY     IR CT HEAD LTD  02/15/2023   IR PERCUTANEOUS ART THROMBECTOMY/INFUSION INTRACRANIAL INC DIAG ANGIO  02/15/2023   IR US GUIDE VASC ACCESS RIGHT  02/15/2023   RADIOLOGY WITH ANESTHESIA N/A 02/15/2023   Procedure: IR WITH ANESTHESIA;  Surgeon: Radiologist, Medication, MD;  Location: [facility] OR;  Service: Radiology;  Laterality: N/A;   RIGHT/LEFT HEART CATH AND CORONARY ANGIOGRAPHY N/A 02/10/2023   Procedure: RIGHT/LEFT HEART CATH AND CORONARY ANGIOGRAPHY;  Surgeon: [place], Peter M, MD;  Location: [facility] INVASIVE CV LAB;  Service: Cardiovascular;  Laterality: N/A;   TONSILLECTOMY     TUBAL LIGATION      FAMILY HISTORY: Family History  Problem Relation Age of Onset   Hypertension Mother    Heart failure Mother    COPD Mother    Diabetes Paternal Grandfather     SOCIAL HISTORY: Social History   Socioeconomic History   Marital status: Single    Spouse name: Not on file   Number of  children: 3   Years of education: Not on file   Highest education level: Associate degree: academic program  Occupational History   Occupation: self employed  Tobacco Use   Smoking status: Former    Current packs/day: 0.00    Average packs/day: 0.5 packs/day for 11.0 years (5.5 ttl pk-yrs)    Types: Cigarettes    Start date: 10/2010    Quit date: 10/2021    Years since quitting: 1.8    Passive exposure: Past   Smokeless tobacco: Never  Vaping Use   Vaping status: Never Used  Substance and Sexual Activity   Alcohol use: Never   Drug use: Yes    Types: Marijuana    Comment: routine use of CBD edibles   Sexual activity: Not on file  Other Topics Concern   Not on file  Social History Narrative   Not on file   Social Determinants of Health   Financial Resource Strain: High Risk (07/25/2023)   Overall Financial Resource Strain (CARDIA)    Difficulty of Paying Living Expenses: Hard  Food Insecurity: No Food Insecurity (07/25/2023)   Hunger Vital Sign    Worried About Running Out of Food in the Last Year: Never true    Ran Out of Food in the Last Year: Never true  Recent Concern: Food Insecurity - Food Insecurity Present (07/21/2023)   Hunger Vital Sign    Worried About Running Out of Food in the Last Year: Often true    Ran Out of Food in the Last Year: Often true  Transportation Needs: No Transportation Needs (07/25/2023)   PRAPARE - Administrator, Civil Service (Medical): No    Lack of Transportation (Non-Medical): No  Recent Concern: Transportation Needs - Unmet Transportation Needs (07/21/2023)   PRAPARE - Transportation    Lack of Transportation (Medical): Yes    Lack of Transportation (Non-Medical): Yes  Physical Activity: Insufficiently Active (07/25/2023)   Exercise Vital Sign    Days of Exercise per Week: 3 days    Minutes of Exercise per Session: 20 min  Stress: Stress Concern Present (07/25/2023)   [employer] of Occupational Health -  Occupational Stress Questionnaire    Feeling of Stress : Rather much  Social Connections: Socially Isolated (07/25/2023)   Social Connection and Isolation Panel [NHANES]    Frequency of Communication with Friends and Family: Once a week    Frequency of Social Gatherings with Friends and Family: Never    Attends Religious Services: Never    Database administrator or Organizations: No    Attends Banker Meetings: Never    Marital Status: Never married  Intimate Partner Violence: Not At Risk (07/25/2023)   Humiliation, Afraid, Rape, and Kick questionnaire    Fear of Current or Ex-Partner: No    Emotionally Abused: No    Physically Abused: No    Sexually Abused: No    PHYSICAL EXAM  GENERAL EXAM/CONSTITUTIONAL: Vitals:  Vitals:   07/28/23 0824  BP: 94/65  Pulse: 83  Weight: 240 lb (108.9 kg)  Height: 5\' 2"  (1.575 m)   Body mass index is 43.9 kg/m. Wt Readings from Last 3 Encounters:  07/28/23 240 lb (108.9 kg)  07/25/23 246 lb 9.6 oz (111.9 kg)  07/22/23 244 lb (110.7 kg)   Patient is in no distress; well developed, nourished and groomed; neck is supple, obese woman  MUSCULOSKELETAL: Gait, strength, tone, movements noted in Neurologic exam below  NEUROLOGIC: MENTAL STATUS:      No data to display         awake, alert, oriented to person, place and time recent and remote memory intact normal attention and concentration language fluent, comprehension intact, naming intact fund of knowledge appropriate  CRANIAL NERVE:  2nd, 3rd, 4th, 6th - Visual fields full to confrontation, extraocular muscles intact, no nystagmus 5th - facial sensation symmetric 7th - facial strength symmetric 8th - hearing intact 9th - palate elevates symmetrically, uvula midline 11th - shoulder shrug symmetric 12th - tongue protrusion midline  MOTOR:  normal bulk and tone, full strength in the BUE, BLE  SENSORY:  normal and symmetric to light touch  COORDINATION:   finger-nose-finger, fine finger movements normal  GAIT/STATION:  normal     DIAGNOSTIC DATA (LABS, IMAGING, TESTING) - I reviewed patient records, labs, notes, testing and imaging myself where available.  Lab Results  Component Value Date   WBC 10.4 02/28/2023   HGB 13.7 02/28/2023   HCT 43.5 02/28/2023   MCV 84.6 02/28/2023   PLT 333 02/28/2023      Component Value Date/Time   NA 137 07/22/2023 1530   NA 144 03/24/2023 0838   K 4.6 07/22/2023 1530   CL 104 07/22/2023 1530   CO2 25 07/22/2023 1530   GLUCOSE 108 (H) 07/22/2023 1530   BUN 14 07/22/2023 1530   BUN 17 03/24/2023 0838   CREATININE 0.75 07/22/2023 1530   CALCIUM 10.0 07/22/2023 1530   PROT 7.0 02/17/2023 0601   ALBUMIN 3.1 (L) 02/17/2023 0601   AST 17 02/17/2023 0601   ALT 21 02/17/2023 0601   ALKPHOS 65 02/17/2023 0601   BILITOT 1.0 02/17/2023 0601   GFRNONAA >60 07/22/2023 1530   Lab Results  Component Value Date   CHOL 155 02/16/2023   HDL 45 02/16/2023   LDLCALC 98 02/16/2023   TRIG 62 02/16/2023   CHOLHDL 3.4 02/16/2023   Lab Results  Component Value Date   HGBA1C 6.3 07/25/2023   No results found for: "VITAMINB12" Lab Results  Component Value Date   TSH 1.459 02/07/2023   CT Head 02/15/2023 1. No evidence of an acute intracranial abnormality. ASPECTS is  10. 2. Possible small chronic lacunar infarct within the left corona radiata.  CTA Neck 02/15/2023 1. The common carotid, internal carotid and vertebral arteries are patent within the neck without stenosis or significant atherosclerotic disease. 2.  Aortic Atherosclerosis (ICD10-I70.0).   CTA head 02/15/2023 1. Abrupt occlusion of a proximal M2 right middle cerebral artery vessel. Neuro-interventional consultation recommended. 2. Mild non-stenotic atherosclerotic plaque within the cavernous left eye  MRI Brain/MRA Head  02/17/2023 1. Small acute infarcts in the right frontal and parietal lobes. 2. A few additional acute punctate  infarcts in the occipital lobes bilaterally and right cerebellum. 3. No emergent large vessel occlusion or proximal hemodynamically significant stenosis.    ASSESSMENT AND PLAN  52 y.o. year old female with multiple medical conditions including hypertension, hyperlipidemia, obesity, heart failure who is presenting after being admitted to the hospital in May for right MCA strokes.  Stroke etiology likely cardioembolic in the setting of low EF.  She is currently on Eliquis and denies any deficits from her strokes. She has been following with cardiology, reports her EF improved from 15 to 35%.  She no longer wearing LifeVest.  She is compliant with her medication including her water pill.  Overall she is doing better.  Plan for patient is to continue current medications, continue to follow with cardiology and we will see her on a yearly basis.  She voiced understanding.   1. Acute right MCA stroke [facility])      Patient Instructions  Continue current medications Continue follow-up PCP Return in 1 year or sooner if worse  No orders of the defined types were placed in this encounter.   No orders of the defined types were placed in this encounter.   Return in about 1 year (around 07/27/2024).  I have spent a total of 45 minutes dedicated to this patient today, preparing to see patient, performing a medically appropriate examination and evaluation, ordering tests and/or medications and procedures, and counseling and educating the patient/family/caregiver; independently interpreting result and communicating results to the family/patient/caregiver; and documenting clinical information in the electronic medical record.   Windell Norfolk, MD 07/28/2023, 9:10 AM  [facility] [address] [place], [place] [address] [phone]

## 2023-07-28 NOTE — Telephone Encounter (Signed)
Patient calling in to see if she stills needs to keep her appt with pharmacist. She states her PCP put her on wecovy. Please advise

## 2023-07-29 ENCOUNTER — Other Ambulatory Visit: Payer: Self-pay

## 2023-07-29 DIAGNOSIS — I509 Heart failure, unspecified: Secondary | ICD-10-CM | POA: Diagnosis not present

## 2023-07-29 DIAGNOSIS — Z7689 Persons encountering health services in other specified circumstances: Secondary | ICD-10-CM | POA: Diagnosis not present

## 2023-07-30 DIAGNOSIS — I509 Heart failure, unspecified: Secondary | ICD-10-CM | POA: Diagnosis not present

## 2023-07-31 DIAGNOSIS — I509 Heart failure, unspecified: Secondary | ICD-10-CM | POA: Diagnosis not present

## 2023-08-01 DIAGNOSIS — I509 Heart failure, unspecified: Secondary | ICD-10-CM | POA: Diagnosis not present

## 2023-08-02 DIAGNOSIS — I509 Heart failure, unspecified: Secondary | ICD-10-CM | POA: Diagnosis not present

## 2023-08-03 DIAGNOSIS — I509 Heart failure, unspecified: Secondary | ICD-10-CM | POA: Diagnosis not present

## 2023-08-04 DIAGNOSIS — I509 Heart failure, unspecified: Secondary | ICD-10-CM | POA: Diagnosis not present

## 2023-08-05 DIAGNOSIS — I509 Heart failure, unspecified: Secondary | ICD-10-CM | POA: Diagnosis not present

## 2023-08-06 ENCOUNTER — Telehealth: Payer: Self-pay | Admitting: Internal Medicine

## 2023-08-06 DIAGNOSIS — I509 Heart failure, unspecified: Secondary | ICD-10-CM | POA: Diagnosis not present

## 2023-08-06 DIAGNOSIS — R7303 Prediabetes: Secondary | ICD-10-CM

## 2023-08-06 NOTE — Telephone Encounter (Signed)
Pt called in about A1c results not showing complete notes yet. She say in /August it showed she had type II diabetes and now it shows prediabetic so she wants to be sure of which one and also requesting monitor for herself to help keep check on this

## 2023-08-06 NOTE — Telephone Encounter (Signed)
Spoke with patient advised that her A1C is 6.1 per the range for diabetes , Pre Diabetes 5.7 -6.4 any thing  over 6.4 patient is Diabetic . Advised that she is a on [place] that is helping with controlling her diabetes. Patient voiced that  she is not taking the [facility] because it has not been available. Patient was very upset that DM type 2 was list as aprt of her DX. Patient voiced that she understood why PCP said that she was pre diabetic base on the ranges. Voices she would like DM type removed form her records.

## 2023-08-07 DIAGNOSIS — I509 Heart failure, unspecified: Secondary | ICD-10-CM | POA: Diagnosis not present

## 2023-08-07 MED ORDER — ACCU-CHEK GUIDE W/DEVICE KIT
PACK | 0 refills | Status: DC
Start: 2023-08-07 — End: 2023-08-15

## 2023-08-07 MED ORDER — ACCU-CHEK SOFTCLIX LANCETS MISC
12 refills | Status: DC
Start: 2023-08-07 — End: 2023-08-21

## 2023-08-07 MED ORDER — ACCU-CHEK GUIDE VI STRP
ORAL_STRIP | 12 refills | Status: DC
Start: 2023-08-07 — End: 2023-08-21

## 2023-08-07 NOTE — Telephone Encounter (Signed)
let patient know that I have not diagnosed her with diabetes.  This was placed on her chart from her hospitalization in May.  I subsequently saw her after that hospitalization and reviewed all the A1c readings in the system and they were consistent with prediabetes.  I will remove the diagnosis of diabetes off her chart. Stephanie Reilly: You indicated that the patient wanted a prescription for glucometer/testing supplies.  Prescription sent.

## 2023-08-07 NOTE — Telephone Encounter (Signed)
Spoke with patient . Patient aware  that DM 2 has been removed and Glucometer has been sent to pharmacy.

## 2023-08-08 ENCOUNTER — Other Ambulatory Visit (INDEPENDENT_AMBULATORY_CARE_PROVIDER_SITE_OTHER): Payer: Medicaid Other

## 2023-08-08 ENCOUNTER — Other Ambulatory Visit: Payer: Self-pay

## 2023-08-08 ENCOUNTER — Telehealth: Payer: Self-pay

## 2023-08-08 ENCOUNTER — Ambulatory Visit: Payer: Medicaid Other | Admitting: Surgical

## 2023-08-08 DIAGNOSIS — M19012 Primary osteoarthritis, left shoulder: Secondary | ICD-10-CM

## 2023-08-08 DIAGNOSIS — Z419 Encounter for procedure for purposes other than remedying health state, unspecified: Secondary | ICD-10-CM | POA: Diagnosis not present

## 2023-08-08 DIAGNOSIS — G8929 Other chronic pain: Secondary | ICD-10-CM | POA: Diagnosis not present

## 2023-08-08 DIAGNOSIS — M25512 Pain in left shoulder: Secondary | ICD-10-CM

## 2023-08-08 DIAGNOSIS — M25562 Pain in left knee: Secondary | ICD-10-CM

## 2023-08-08 DIAGNOSIS — Z7689 Persons encountering health services in other specified circumstances: Secondary | ICD-10-CM | POA: Diagnosis not present

## 2023-08-08 DIAGNOSIS — I509 Heart failure, unspecified: Secondary | ICD-10-CM | POA: Diagnosis not present

## 2023-08-08 NOTE — Telephone Encounter (Signed)
Auth needed for bilat knee gel. Patient has medicaid but maybe she would qualify for [place] assistance program?

## 2023-08-08 NOTE — Progress Notes (Signed)
Advanced Heart Failure Clinic Note   Referring Physician: Marcine Matar, MD  Primary Care: Marcine Matar, MD Primary Cardiologist: Dr. Antoine Poche HF Cardiologist: Dr. Gasper Lloyd  HPI:  Stephanie Reilly is a 52 y.o. female with hypertension, hyperlipidemia, obesity and systolic heart failure. Her history dates back to 02/15/23 when she was admitted to [facility] 2/2 stroke from occlusion of the proximal right M2.  She was sent to interventional radiology and intubated.  Immediately postintubation she had PEA arrest ultimately undergoing diagnostic cerebral angiogram and thrombectomy for the right M2 occlusion now status post complete recanalization.   Her cardiac history dates back to 02/07/2023 when she presented to the emergency department with chest pain and shortness of breath that started in March 2024.  During that admission patient had an echocardiogram that demonstrated EF of 20% with global hypokinesis but preserved RV function.  She underwent left heart cath which was nonsignificant and right heart cath that demonstrated preserved cardiac index.  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  She was discharged home on Entresto, Farxiga, and spironolactone and metoprolol.   Presented ot AHF Clinic with Dr. Gasper Lloyd. She had been doing very well since her last visit; she had minimal shortness of breath but reported that she does not 'exert herself much'. With excessive exertion she reports having shortness of breath for 5-10 minutes requiring at least 10-15 mins of rest. She continued to go on 20 minute walks 3x weekly. Noted she is complaint with all medications. Limited mostly by left knee arthritis now.   Today she returns to HF clinic for pharmacist medication titration. At last visit with MD Sherryll Burger was increased to 97/103 mg BID. Additionally, she has been started on [facility] by her PCP. Overall she is doing well today. Notes her appetite has  been curbed by the [facility] already. She follows a low sodium diet and has been limiting her carbohydrate intake. Notes she takes her BG multiple times per day and it is always within the 70-130 range she was instructed to follow. Notes her BG was 100 this morning. She has been exercising consistently and also doing resistance exercises for her arms and legs. Notes she has been losing weight and can tell because her leggings fit better and her clothing size has decreased. No dizziness or lightheadedness unless she stands up after forgetting to wear her glasses, notes this feels similar to vertigo. BP at home ranges anywhere from 106-124/60-70s. No CP but does complain of excess gas that is bothersome. Says it is not relieved by Tums and occurs regardless of whether she eats gassy food or not. Has been very active and doing exercises at home consistently. She states she knows not to over-exert herself. She walks a mile at her apartment complex and will stop to catch her breath for a few minutes before proceeding. Believes continued weight loss will help with this. Says she can't go up steps yet. Is limited by SOB going up stairs and knee pain going down stairs. She weighs herself daily at home. Normal weight range is 235-244 lbs. Notes weight is 240-244 lbs when she has excess fluid. Notes she takes torsemide PRN, but has been using it more often lately, which she attributes to the cold weather. Has mild swelling in her legs, slightly more in R leg than left. No PND/orthopnea. Taking and tolerating all medications. She has an excellent support system.   HF Medications: Carvedilol 12.5 mg  BID Entresto 97/103 mg BID Spironolactone 50 mg daily Farxiga 10 mg daily Torsemide 20 mg PRN  Has the patient been experiencing any side effects to the medications prescribed?  no  Does the patient have any problems obtaining medications due to transportation or finances?   No; [place] Medicaid  Understanding of regimen:  excellent Understanding of indications: excellent Potential of compliance: excellent Patient understands to avoid NSAIDs. Patient understands to avoid decongestants.    Pertinent Lab Values: 07/22/23: Serum creatinine 0.75, BUN 14, Potassium 4.6, Sodium 137, BNP 32.1  Vital Signs: Weight: 244.2 lbs (last clinic weight: 244 lbs) Blood pressure: 104/72  Heart rate: 83   Assessment/Plan: Heart Failure with reduced EF Etiology of WU:[id number] cardiomyopathy; cardiac PET ordered & pending.  NYHA class / AHA Stage:NYHA IIB Volume status & Diuretics: mildly hypervolemic; continue torsemide 20 mg PRN.  Vasodilators:Continue Entresto 97/103 mg BID. Beta-Blocker:Continue carvedilol 12.5 mg BID, will not increase today given BP at low end of normal and mildly elevated volume status; now off digoxin.  FAO:[id number] spironolactone 50 mg daily Cardiometabolic:Continue Farxiga 10 mg daily Devices therapies & Valvulopathies: prior echo with improvement of LVEF to 30-35%; will continue to uptitrate GDMT. Plan to repeat in January 2025 Advanced therapies:not currently indicated.    2. Right M2 stroke - s/p mechanical thrombectomy with small SAH - Suspect cardio embolic  - On Eliquis + statin.  - No residual deficits.  - No bleeding issues.   3. NSVT /PVCs  - EP saw during recent admit, PVC burden not high enough to warrant amiodarone.    4. Obesity  - Body mass index is 44.63 kg/m. - Started Wegovy this week - Has been very active and made many positive changes to her diet, congratulated.   Follow up in January 2025 with Dr. Junius Creamer.   Karle Plumber, PharmD, BCPS, BCCP, CPP Heart Failure Clinic Pharmacist [phone]

## 2023-08-08 NOTE — Telephone Encounter (Signed)
[employer] does not cover patients with Medicaid any more.  They will only cover patients without any insurance.

## 2023-08-09 DIAGNOSIS — R152 Fecal urgency: Secondary | ICD-10-CM | POA: Diagnosis not present

## 2023-08-09 DIAGNOSIS — I509 Heart failure, unspecified: Secondary | ICD-10-CM | POA: Diagnosis not present

## 2023-08-09 DIAGNOSIS — I5021 Acute systolic (congestive) heart failure: Secondary | ICD-10-CM | POA: Diagnosis not present

## 2023-08-09 DIAGNOSIS — R159 Full incontinence of feces: Secondary | ICD-10-CM | POA: Diagnosis not present

## 2023-08-09 DIAGNOSIS — N3946 Mixed incontinence: Secondary | ICD-10-CM | POA: Diagnosis not present

## 2023-08-10 ENCOUNTER — Encounter: Payer: Self-pay | Admitting: Surgical

## 2023-08-10 DIAGNOSIS — I509 Heart failure, unspecified: Secondary | ICD-10-CM | POA: Diagnosis not present

## 2023-08-10 MED ORDER — LIDOCAINE HCL 1 % IJ SOLN
3.0000 mL | INTRAMUSCULAR | Status: AC | PRN
  Administered 2023-08-08: 3 mL

## 2023-08-10 MED ORDER — BUPIVACAINE HCL 0.25 % IJ SOLN
0.5000 mL | INTRAMUSCULAR | Status: AC | PRN
Start: 2023-08-08 — End: 2023-08-08
  Administered 2023-08-08: .5 mL via INTRA_ARTICULAR

## 2023-08-10 NOTE — Telephone Encounter (Signed)
We can try toradol injections then. I'd see how her shoulder did with that AC inj and if it was helpful lets try that in the knees

## 2023-08-10 NOTE — Progress Notes (Signed)
Office Visit Note   Patient: Stephanie Reilly           Date of Birth: 1970/12/27           MRN: [id number] Visit Date: 08/08/2023 Requested by: Marcine Matar, MD [address] [place],  [place] [address] PCP: Marcine Matar, MD  Subjective: Chief Complaint  Patient presents with   Left Shoulder - Pain   Left Knee - Pain    HPI: Stephanie Reilly is a 52 y.o. female who presents to the office reporting left knee greater than right knee pain and left shoulder pain.  She is right-hand dominant.  Reports history of left knee pain since she was 52 years old and had an incident of patellar dislocation while sliding to a base in a softball game.  More recently she has been diagnosed with knee arthritis and has seen multiple providers.  Complains primarily of left knee pain localizing to the anteromedial aspect of the knee.  Does have some grinding sensation in the lateral aspect of the knee.  No history of prior surgery.  She denies any groin pain.  Does have some low back pain and occasional sciatica but no significant radicular pain recently.  She has had prior bilateral knee cortisone injections at [facility] and had a reaction to the steroids which caused shortness of breath anxiety and a fair amount of weight gain and the following weeks to months.  She takes Tylenol and uses pain patches with minimal relief.  She has significant medical history with CHF, hypertension, prediabetes.  Regarding the left shoulder, she localizes pain to the superior and anterior aspects of the shoulder.  She has pain primarily with abduction of the shoulder.  Pain will occasionally radiate to the elbow.  She does not have any neck pain but will occasionally have scapular and trapezius pain is the same secondary.  No surgery history for her neck or shoulder.  No numbness or tingling commonly..                ROS: All systems reviewed are negative as they relate to the chief complaint within the history  of present illness.  Patient denies fevers or chills.  Assessment & Plan: Visit Diagnoses:  1. Chronic left shoulder pain   2. Chronic pain of left knee     Plan: Patient is a 52 year old female who presents for evaluation of left shoulder pain and left knee pain.  Has history of bilateral knee osteoarthritis and is not optimal surgical candidate with her young age and medical comorbidities.  She states that she "almost died on the table 4 times this year".  With previous reaction to cortisone injection, think that we should try gel injections which she has never had.  We will preapproved her for these and follow-up afterward to try.  Regarding her left shoulder, she has some early degenerative changes of the glenohumeral joint on radiographs today and little bit of enthesophyte formation off the greater tuberosity.  However her rotator cuff strength seems excellent on exam today.  Most of her pain seems to localize to the [facility] joint.  After discussion of options she would like to try injection into the [facility] joint.  Injection consisting of half cc Marcaine mixed with half cc of Toradol was administered under ultrasound guidance and patient tolerated procedure well.  She had greater than 50% relief of shoulder pain with reexamination.  Follow-up for knee gel injections.  This patient is  diagnosed with osteoarthritis of the knee(s).    Radiographs show evidence of joint space narrowing, osteophytes, subchondral sclerosis and/or subchondral cysts.  This patient has knee pain which interferes with functional and activities of daily living.    This patient has experienced inadequate response, adverse effects and/or intolerance with conservative treatments such as acetaminophen, NSAIDS, topical creams, physical therapy or regular exercise, knee bracing and/or weight loss.   This patient has experienced inadequate response or has a contraindication to intra articular steroid injections for at least 3 months.    This patient is not scheduled to have a total knee replacement within 6 months of starting treatment with viscosupplementation.   Follow-Up Instructions: No follow-ups on file.   Orders:  Orders Placed This Encounter  Procedures   XR Shoulder Left   XR KNEE 3 VIEW LEFT   US Guided Needle Placement - No Linked Charges   No orders of the defined types were placed in this encounter.     Procedures: Medium Joint Inj: L acromioclavicular on 08/08/2023 7:21 AM Indications: diagnostic evaluation and pain Details: 25 G 1.5 in needle, ultrasound-guided superior approach Medications: 3 mL lidocaine 1 %; 0.5 mL bupivacaine 0.25 % Outcome: tolerated well, no immediate complications Procedure, treatment alternatives, risks and benefits explained, specific risks discussed. Consent was given by the patient. Immediately prior to procedure a time out was called to verify the correct patient, procedure, equipment, support staff and site/side marked as required. Patient was prepped and draped in the usual sterile fashion.       Clinical Data: No additional findings.  Objective: Vital Signs: There were no vitals taken for this visit.  Physical Exam:  Constitutional: Patient appears well-developed HEENT:  Head: Normocephalic Eyes:EOM are normal Neck: Normal range of motion Cardiovascular: Normal rate Pulmonary/chest: Effort normal Neurologic: Patient is alert Skin: Skin is warm Psychiatric: Patient has normal mood and affect  Ortho Exam: Ortho exam demonstrates left knee with small effusion.  Tenderness over the medial joint line moderately and lateral joint line mildly.  She is able to perform straight leg raise.  No pain with hip range of motion.  Stable to anterior posterior drawer sign.  No evidence of cellulitis or skin changes noted.  Right shoulder with 70 degrees X rotation, 120 degrees abduction, 170 degrees forward elevation passively and actively.  This compared with the left  shoulder with 70 degrees X rotation, 90 degrees abduction, 150 degrees forward elevation passively and actively.  She has excellent rotator cuff strength of supra, infra, subscap rated 5/5.  Actually nerve intact with deltoid firing.  She does have moderate to severe tenderness overlying the AC joint of the left shoulder which is significantly worse than any tenderness of the [facility] joint of the right shoulder.  Moderate tenderness over the bicipital groove of the left shoulder.  Negative O'Brien sign.  Negative empty can test.  Weakly positive Neer Hawkins impingement signs.  Specialty Comments:  No specialty comments available.  Imaging: No results found.   PMFS History: Patient Active Problem List   Diagnosis Date Noted   VT (ventricular tachycardia) (HCC) 05/08/2023   Osteoarthritis of both knees 03/03/2023   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) 02/15/2023   Shock circulatory (HCC) 02/15/2023   Iron deficiency anemia 02/09/2023   Heart failure (HCC) 02/08/2023   Hypokalemia 02/08/2023   Acute on chronic combined systolic and diastolic CHF (congestive heart failure) (HCC) 02/07/2023   Essential hypertension 02/07/2023   Elevated troponin 02/07/2023  Obesity, Class III, BMI 40-49.9 (morbid obesity) (HCC) 02/07/2023   Past Medical History:  Diagnosis Date   Anemia    Anxiety    Arthritis    Depression    Hypertension    Prediabetes     Family History  Problem Relation Age of Onset   Hypertension Mother    Heart failure Mother    COPD Mother    Diabetes Paternal Grandfather     Past Surgical History:  Procedure Laterality Date   ABDOMINAL HYSTERECTOMY     IR CT HEAD LTD  02/15/2023   IR PERCUTANEOUS ART THROMBECTOMY/INFUSION INTRACRANIAL INC DIAG ANGIO  02/15/2023   IR US GUIDE VASC ACCESS RIGHT  02/15/2023   RADIOLOGY WITH ANESTHESIA N/A 02/15/2023   Procedure: IR WITH ANESTHESIA;  Surgeon: Radiologist, Medication, MD;  Location: [facility] OR;  Service:  Radiology;  Laterality: N/A;   RIGHT/LEFT HEART CATH AND CORONARY ANGIOGRAPHY N/A 02/10/2023   Procedure: RIGHT/LEFT HEART CATH AND CORONARY ANGIOGRAPHY;  Surgeon: [place], Peter M, MD;  Location: [facility] INVASIVE CV LAB;  Service: Cardiovascular;  Laterality: N/A;   TONSILLECTOMY     TUBAL LIGATION     Social History   Occupational History   Occupation: self employed  Tobacco Use   Smoking status: Former    Current packs/day: 0.00    Average packs/day: 0.5 packs/day for 11.0 years (5.5 ttl pk-yrs)    Types: Cigarettes    Start date: 10/2010    Quit date: 10/2021    Years since quitting: 1.8    Passive exposure: Past   Smokeless tobacco: Never  Vaping Use   Vaping status: Never Used  Substance and Sexual Activity   Alcohol use: Never   Drug use: Yes    Types: Marijuana    Comment: routine use of CBD edibles   Sexual activity: Not on file

## 2023-08-11 ENCOUNTER — Ambulatory Visit: Payer: Medicaid Other

## 2023-08-11 ENCOUNTER — Ambulatory Visit: Payer: Self-pay

## 2023-08-11 DIAGNOSIS — I509 Heart failure, unspecified: Secondary | ICD-10-CM | POA: Diagnosis not present

## 2023-08-11 NOTE — Telephone Encounter (Signed)
Chief Complaint: Glucose Meter question  Symptoms: None  Disposition: [] ED /[] Urgent Care (no appt availability in office) / [] Appointment(In office/virtual)/ []  [place] [facility]/ [x] Home Care/ [] Refused Recommended Disposition /[] [place] [facility]/ []  Follow-up with PCP Additional Notes: Patient asked what her highest target range should be set to on her glucometer. Advised patient generally speaking the goal is have a glucose reading of less than 130. Patient advised she can set her highest target goal to 130 so she will get an alert if blood glucose is higher. Advised patient to monitor for lows of less than 70 as well. Patient advised to continue diet and exercise to keep up the good work. Patient reported her blood glucose is usually between 80-118.  Summary: Glucose Meter Advice   Pt what is her highest target range to put in her glucose meter       Reason for Disposition  Health Information question, no triage required and triager able to answer question  Answer Assessment - Initial Assessment Questions 1. REASON FOR CALL or QUESTION: "What is your reason for calling today?" or "How can I best help you?" or "What question do you have that I can help answer?"     What should I set my highest range as on glucose meter?  Protocols used: Information Only Call - No Triage-A-AH

## 2023-08-11 NOTE — Telephone Encounter (Signed)
Scheduled for bilat knee toradol injections

## 2023-08-11 NOTE — Telephone Encounter (Signed)
Summary: Glucose Meter Advice   Pt what is her highest target range to put in her glucose meter    Called pt - left message to return our call.

## 2023-08-12 DIAGNOSIS — I509 Heart failure, unspecified: Secondary | ICD-10-CM | POA: Diagnosis not present

## 2023-08-13 DIAGNOSIS — I509 Heart failure, unspecified: Secondary | ICD-10-CM | POA: Diagnosis not present

## 2023-08-13 DIAGNOSIS — Z7689 Persons encountering health services in other specified circumstances: Secondary | ICD-10-CM | POA: Diagnosis not present

## 2023-08-14 DIAGNOSIS — I509 Heart failure, unspecified: Secondary | ICD-10-CM | POA: Diagnosis not present

## 2023-08-15 ENCOUNTER — Telehealth: Payer: Self-pay | Admitting: Internal Medicine

## 2023-08-15 ENCOUNTER — Other Ambulatory Visit: Payer: Self-pay | Admitting: Internal Medicine

## 2023-08-15 DIAGNOSIS — R7303 Prediabetes: Secondary | ICD-10-CM

## 2023-08-15 DIAGNOSIS — I509 Heart failure, unspecified: Secondary | ICD-10-CM | POA: Diagnosis not present

## 2023-08-15 NOTE — Telephone Encounter (Signed)
Pt is calling in because she says Dr. Laural Benes sent in a prescription for a glucose monitor and the insurance doesn't cover it. Pt says she picked up a monitor a while back and it was covered by insurance and works fine so she doesn't need another one. Pt also wanted to let Dr. Laural Benes know that she got her [facility] and she starts her first injection on Monday.

## 2023-08-16 DIAGNOSIS — I509 Heart failure, unspecified: Secondary | ICD-10-CM | POA: Diagnosis not present

## 2023-08-17 DIAGNOSIS — I509 Heart failure, unspecified: Secondary | ICD-10-CM | POA: Diagnosis not present

## 2023-08-18 DIAGNOSIS — I509 Heart failure, unspecified: Secondary | ICD-10-CM | POA: Diagnosis not present

## 2023-08-19 DIAGNOSIS — I509 Heart failure, unspecified: Secondary | ICD-10-CM | POA: Diagnosis not present

## 2023-08-20 ENCOUNTER — Ambulatory Visit (HOSPITAL_COMMUNITY)
Admission: RE | Admit: 2023-08-20 | Discharge: 2023-08-20 | Disposition: A | Discharge status: Home / Self Care | Payer: Medicaid Other | Source: Ambulatory Visit | Attending: Cardiology | Admitting: Cardiology

## 2023-08-20 VITALS — BP 104/72 | HR 83 | Wt 244.2 lb

## 2023-08-20 DIAGNOSIS — I4729 Other ventricular tachycardia: Secondary | ICD-10-CM | POA: Diagnosis not present

## 2023-08-20 DIAGNOSIS — Z7901 Long term (current) use of anticoagulants: Secondary | ICD-10-CM | POA: Diagnosis not present

## 2023-08-20 DIAGNOSIS — Z7689 Persons encountering health services in other specified circumstances: Secondary | ICD-10-CM | POA: Diagnosis not present

## 2023-08-20 DIAGNOSIS — E785 Hyperlipidemia, unspecified: Secondary | ICD-10-CM | POA: Diagnosis not present

## 2023-08-20 DIAGNOSIS — E669 Obesity, unspecified: Secondary | ICD-10-CM | POA: Diagnosis not present

## 2023-08-20 DIAGNOSIS — I509 Heart failure, unspecified: Secondary | ICD-10-CM | POA: Diagnosis not present

## 2023-08-20 DIAGNOSIS — I493 Ventricular premature depolarization: Secondary | ICD-10-CM | POA: Insufficient documentation

## 2023-08-20 DIAGNOSIS — Z6841 Body Mass Index (BMI) 40.0 and over, adult: Secondary | ICD-10-CM | POA: Diagnosis not present

## 2023-08-20 DIAGNOSIS — I5022 Chronic systolic (congestive) heart failure: Secondary | ICD-10-CM | POA: Diagnosis not present

## 2023-08-20 DIAGNOSIS — Z8673 Personal history of transient ischemic attack (TIA), and cerebral infarction without residual deficits: Secondary | ICD-10-CM | POA: Insufficient documentation

## 2023-08-20 DIAGNOSIS — I10 Essential (primary) hypertension: Secondary | ICD-10-CM | POA: Diagnosis present

## 2023-08-20 DIAGNOSIS — I110 Hypertensive heart disease with heart failure: Secondary | ICD-10-CM | POA: Insufficient documentation

## 2023-08-20 MED ORDER — ROSUVASTATIN CALCIUM 20 MG PO TABS: 20.0000 mg | ORAL_TABLET | Freq: Every day | ORAL | 3 refills | Status: DC

## 2023-08-20 MED ORDER — DAPAGLIFLOZIN PROPANEDIOL 10 MG PO TABS: 10.0000 mg | ORAL_TABLET | Freq: Every day | ORAL | 3 refills | Status: DC

## 2023-08-20 MED ORDER — APIXABAN 5 MG PO TABS: 5.0000 mg | ORAL_TABLET | Freq: Two times a day (BID) | ORAL | 3 refills | Status: DC

## 2023-08-20 NOTE — Patient Instructions (Signed)
It was a pleasure seeing you today!  MEDICATIONS: -No medication changes today -Call if you have questions about your medications.   NEXT APPOINTMENT: Return to clinic in January with Dr. Wadie Lessen plus and echo. PLEASE CALL OUR OFFICE AROUND DECEMBER TO GET SCHEDULED FOR YOUR APPOINTMENT. PHONE NUMBER IS [phone] OPTION 2   In general, to take care of your heart failure: -Limit your fluid intake to 2 Liters (half-gallon) per day.   -Limit your salt intake to ideally 2-3 grams (2000-3000 mg) per day. -Weigh yourself daily and record, and bring that "weight diary" to your next appointment.  (Weight gain of 2-3 pounds in 1 day typically means fluid weight.) -The medications for your heart are to help your heart and help you live longer.   -Please contact us before stopping any of your heart medications.  Call the clinic at [phone] with questions or to reschedule future appointments.

## 2023-08-21 ENCOUNTER — Telehealth: Payer: Self-pay

## 2023-08-21 DIAGNOSIS — R7303 Prediabetes: Secondary | ICD-10-CM

## 2023-08-21 DIAGNOSIS — I509 Heart failure, unspecified: Secondary | ICD-10-CM | POA: Diagnosis not present

## 2023-08-21 MED ORDER — ACCU-CHEK SOFTCLIX LANCETS MISC: 12 refills | Status: DC

## 2023-08-21 MED ORDER — ACCU-CHEK GUIDE TEST VI STRP: ORAL_STRIP | 12 refills | Status: DC

## 2023-08-21 MED ORDER — ACCU-CHEK GUIDE W/DEVICE KIT: PACK | 0 refills | Status: DC

## 2023-08-21 NOTE — Telephone Encounter (Signed)
Copied from CRM [id number]. Topic: General - Other >> Aug 20, 2023 12:13 PM Stephanie Reilly E wrote: Reason for CRM: Pt tests her blood sugar 7 times a day. Before and after each meal and once before bed because she is on Ozempic.   She is requesting test strips and lancets for accu-chek guide, needs new refills with the amount of times per day that she is checking in the directions.  [facility] #[id number] - [place], [place] - [address] [place] [address] Phone: [phone] Fax: [phone]  Says she will be completely out of her current supply by Friday >> Aug 21, 2023  9:53 AM [name] M wrote: Pt called back for an update as she stated that she really needs a new Rx for the Accu-Chek Guide test strips to be able to check her blood sugar 7 times daily. Pt also stated that she needs lancets. Pt requests that the Rx be sent asap because she will be out by tomorrow.

## 2023-08-21 NOTE — Telephone Encounter (Signed)
Rxns sent with requested changes. Of note, insurance may not pay for testing this frequently. Medicaid will usually cover up to a max of 3 times daily.

## 2023-08-21 NOTE — Addendum Note (Signed)
Addended by: Lois Huxley, Jeannett Senior L on: 08/21/2023 12:30 PM   Modules accepted: Orders

## 2023-08-21 NOTE — Telephone Encounter (Signed)
Called & spoke to the patient. Verified name & DOB. Informed that refill requests for the lancets and strips have been sent to the pharmacy. Informed patient that insurance may not pay for testing this frequently. Medicaid will usually pay up to a max os 3 times daily. Patient expressed verbal understanding of all discussed.

## 2023-08-22 ENCOUNTER — Encounter: Payer: Self-pay | Admitting: Surgical

## 2023-08-22 ENCOUNTER — Ambulatory Visit: Payer: Medicaid Other | Admitting: Surgical

## 2023-08-22 DIAGNOSIS — M170 Bilateral primary osteoarthritis of knee: Secondary | ICD-10-CM

## 2023-08-22 DIAGNOSIS — Z7689 Persons encountering health services in other specified circumstances: Secondary | ICD-10-CM | POA: Diagnosis not present

## 2023-08-22 DIAGNOSIS — I509 Heart failure, unspecified: Secondary | ICD-10-CM | POA: Diagnosis not present

## 2023-08-22 MED ORDER — LIDOCAINE HCL 1 % IJ SOLN
5.0000 mL | INTRAMUSCULAR | Status: AC | PRN
  Administered 2023-08-22: 5 mL

## 2023-08-22 MED ORDER — BUPIVACAINE HCL 0.25 % IJ SOLN
4.0000 mL | INTRAMUSCULAR | Status: AC | PRN
  Administered 2023-08-22: 4 mL via INTRA_ARTICULAR

## 2023-08-22 NOTE — Progress Notes (Signed)
   Procedure Note  Patient: Stephanie Reilly             Date of Birth: Oct 24, 1970           MRN: [id number]             Visit Date: 08/22/2023  Procedures: Visit Diagnoses:  1. Bilateral primary osteoarthritis of knee     Large Joint Inj: bilateral knee on 08/22/2023 4:26 PM Indications: diagnostic evaluation, joint swelling and pain Details: 18 G 1.5 in needle, superolateral approach  Arthrogram: No  Medications (Right): 5 mL lidocaine 1 %; 4 mL bupivacaine 0.25 % Medications (Left): 5 mL lidocaine 1 %; 4 mL bupivacaine 0.25 % Outcome: tolerated well, no immediate complications  Each bupivacaine injection was mixed with 1 cc of Toradol.  This Toradol/Marcaine injection did well for her AC joint and she states that her shoulder is still feeling a lot better.  We will see how this does for her knees since she cannot have cortisone injections due to her reaction and cannot have gel injections due to her insurance not covering this.  Plan to follow-up as needed. Procedure, treatment alternatives, risks and benefits explained, specific risks discussed. Consent was given by the patient. Immediately prior to procedure a time out was called to verify the correct patient, procedure, equipment, support staff and site/side marked as required. Patient was prepped and draped in the usual sterile fashion.

## 2023-08-23 DIAGNOSIS — I509 Heart failure, unspecified: Secondary | ICD-10-CM | POA: Diagnosis not present

## 2023-08-24 DIAGNOSIS — I509 Heart failure, unspecified: Secondary | ICD-10-CM | POA: Diagnosis not present

## 2023-08-25 DIAGNOSIS — I509 Heart failure, unspecified: Secondary | ICD-10-CM | POA: Diagnosis not present

## 2023-08-27 DIAGNOSIS — I509 Heart failure, unspecified: Secondary | ICD-10-CM | POA: Diagnosis not present

## 2023-08-28 DIAGNOSIS — I509 Heart failure, unspecified: Secondary | ICD-10-CM | POA: Diagnosis not present

## 2023-08-29 DIAGNOSIS — I509 Heart failure, unspecified: Secondary | ICD-10-CM | POA: Diagnosis not present

## 2023-08-29 DIAGNOSIS — Z7689 Persons encountering health services in other specified circumstances: Secondary | ICD-10-CM | POA: Diagnosis not present

## 2023-08-30 DIAGNOSIS — I509 Heart failure, unspecified: Secondary | ICD-10-CM | POA: Diagnosis not present

## 2023-08-31 DIAGNOSIS — I509 Heart failure, unspecified: Secondary | ICD-10-CM | POA: Diagnosis not present

## 2023-09-01 ENCOUNTER — Ambulatory Visit: Payer: Self-pay | Admitting: *Deleted

## 2023-09-01 DIAGNOSIS — R7303 Prediabetes: Secondary | ICD-10-CM

## 2023-09-01 DIAGNOSIS — I509 Heart failure, unspecified: Secondary | ICD-10-CM | POA: Diagnosis not present

## 2023-09-01 MED ORDER — WEGOVY 0.25 MG/0.5ML ~~LOC~~ SOAJ: 0.5000 mg | SUBCUTANEOUS | 1 refills | Status: DC

## 2023-09-01 NOTE — Telephone Encounter (Signed)
  Chief Complaint: medication question  Disposition: [] ED /[] Urgent Care (no appt availability in office) / [] Appointment(In office/virtual)/ []  [place] [facility]/ [] Home Care/ [] Refused Recommended Disposition /[] [place] [facility]/ [x]  Follow-up with PCP Additional Notes: Patient would like to increase her dosing- she is having no SE and would like to go up to next dose on Wegovy- the drug Store is having trouble getting lowest dose.

## 2023-09-01 NOTE — Telephone Encounter (Signed)
Let patient know that if she has been on the Wegovy 0.25 mg once a week for at least a month and has tolerated it well, then we can increase the dose to the 0.5 mg.  She will need to dial up the pen to the 0.5 mg dose.  Updated prescription sent to her pharmacy.

## 2023-09-01 NOTE — Telephone Encounter (Signed)
Summary: rx concern / rx refill req   The patient would like to be contacted by a member of clinical staff to discuss their current prescription of Semaglutide-Weight Management (WEGOVY) 0.25 MG/0.5ML Ivory Broad [[id number]]  The patient would like to know when their medication will be increased?  The patient shares that they have not have any side effects  The patient has been told by their pharmacy that the only available location for their prescription would be [facility] #[id number] - [place], [place] - [address] AT [facility] [address] & [facility] [address] [place] [address] Phone: [phone] Fax: [phone] Hours: Not open 24 hours  Please contact the patient further when available     Reason for Disposition  [1] Caller has NON-URGENT medicine question about med that PCP prescribed AND [2] triager unable to answer question  Answer Assessment - Initial Assessment Questions 1. NAME of MEDICINE: "What medicine(s) are you calling about?"     Patient reports she is doing well with her injectable 2. QUESTION: "What is your question?" (e.g., double dose of medicine, side effect)     Patient reports no SE- decreased appetite, patient is exercising, walking , patient feels very motivated  3. PRESCRIBER: "Who prescribed the medicine?" Reason: if prescribed by specialist, call should be referred to that group.     PCP  Last pen is 12/2- Patient states all the [facility] are out of 0.25 - so she want to go up in dose. Pharmacy given has increased dose.  Protocols used: Medication Question Call-A-AH

## 2023-09-01 NOTE — Addendum Note (Signed)
Addended by: Jonah Blue B on: 09/01/2023 06:03 PM   Modules accepted: Orders

## 2023-09-02 ENCOUNTER — Other Ambulatory Visit: Payer: Self-pay | Admitting: Pharmacist

## 2023-09-02 ENCOUNTER — Telehealth: Payer: Self-pay | Admitting: Internal Medicine

## 2023-09-02 DIAGNOSIS — I509 Heart failure, unspecified: Secondary | ICD-10-CM | POA: Diagnosis not present

## 2023-09-02 DIAGNOSIS — Z7689 Persons encountering health services in other specified circumstances: Secondary | ICD-10-CM | POA: Diagnosis not present

## 2023-09-02 MED ORDER — WEGOVY 0.5 MG/0.5ML ~~LOC~~ SOAJ: 0.5000 mg | SUBCUTANEOUS | 1 refills | Status: DC

## 2023-09-02 NOTE — Telephone Encounter (Signed)
Patient called back & stated that she is on her way to pick up the [facility]. Informed patient to make sure she dials the dosage to 0.5 mg and to ask the pharmacist to assist her on how to do so. Patient expressed verbal understanding of all discussed. No further  questions at this time.

## 2023-09-02 NOTE — Telephone Encounter (Signed)
Copied from CRM [id number]. Topic: General - Other >> Sep 01, 2023  4:51 PM Marlow Baars wrote: Reason for CRM: The patient called in stating she found the .25 of the [facility] at the [facility] on [address]. In fact  it was the last box. They are filling it for her and she will talk with her provider at her appt next month about going up to the next level.

## 2023-09-02 NOTE — Telephone Encounter (Signed)
Patient called stated she picked up the .25 for the [facility] but they do not dial up to .50 the provider would need to send in a new script for the .50. Please f/u with patient

## 2023-09-02 NOTE — Telephone Encounter (Signed)
Patient called back & stated that she is on her way to pick up the [facility]. Informed patient to make sure she dials the dosage to 0.5 mg and to ask the pharmacist to assist her on how to do so. Patient expressed verbal understanding of all discussed. No further  questions at this time.

## 2023-09-03 ENCOUNTER — Ambulatory Visit: Payer: Self-pay

## 2023-09-03 DIAGNOSIS — I509 Heart failure, unspecified: Secondary | ICD-10-CM | POA: Diagnosis not present

## 2023-09-03 NOTE — Telephone Encounter (Signed)
  Chief Complaint: medication problem  Symptoms: NA Frequency: today Pertinent Negatives: NA Disposition: [] ED /[] Urgent Care (no appt availability in office) / [] Appointment(In office/virtual)/ []  [place] [facility]/ [x] Home Care/ [] Refused Recommended Disposition /[] [place] Mobile [facility]/ []  Follow-up with PCP Additional Notes: [facility] called, spoke with [place], [facility]. She was stating that [facility] rx was for 0.25 mg but written instructions for 0.5mg , advised her new rx was sent yesterday for 0.5 mg and inject 0.5 mg, she was going to remove 0.25 MG dose from system. No further assistance needed.   Summary: Rx clarification   [name] called in stating the directions for the wegovy prescription do not match the drug. [name] requesting new prescription for wegovy be sent in with instructions that match the drug.         Reason for Disposition  Pharmacy calling with prescription question and triager answers question  Answer Assessment - Initial Assessment Questions 1. NAME of MEDICINE: "What medicine(s) are you calling about?"     Wegovy  2. QUESTION: "What is your question?" (e.g., double dose of medicine, side effect)     Needing clarification on Wegovy rx 3. PRESCRIBER: "Who prescribed the medicine?" Reason: if prescribed by specialist, call should be referred to that group.     Dr. Laural Benes  Protocols used: Medication Question Call-A-AH

## 2023-09-04 DIAGNOSIS — I509 Heart failure, unspecified: Secondary | ICD-10-CM | POA: Diagnosis not present

## 2023-09-05 DIAGNOSIS — I509 Heart failure, unspecified: Secondary | ICD-10-CM | POA: Diagnosis not present

## 2023-09-06 DIAGNOSIS — I509 Heart failure, unspecified: Secondary | ICD-10-CM | POA: Diagnosis not present

## 2023-09-07 DIAGNOSIS — Z419 Encounter for procedure for purposes other than remedying health state, unspecified: Secondary | ICD-10-CM | POA: Diagnosis not present

## 2023-09-07 DIAGNOSIS — I509 Heart failure, unspecified: Secondary | ICD-10-CM | POA: Diagnosis not present

## 2023-09-08 DIAGNOSIS — N3946 Mixed incontinence: Secondary | ICD-10-CM | POA: Diagnosis not present

## 2023-09-08 DIAGNOSIS — R152 Fecal urgency: Secondary | ICD-10-CM | POA: Diagnosis not present

## 2023-09-08 DIAGNOSIS — I509 Heart failure, unspecified: Secondary | ICD-10-CM | POA: Diagnosis not present

## 2023-09-08 DIAGNOSIS — R159 Full incontinence of feces: Secondary | ICD-10-CM | POA: Diagnosis not present

## 2023-09-08 DIAGNOSIS — I5021 Acute systolic (congestive) heart failure: Secondary | ICD-10-CM | POA: Diagnosis not present

## 2023-09-09 DIAGNOSIS — I509 Heart failure, unspecified: Secondary | ICD-10-CM | POA: Diagnosis not present

## 2023-09-10 ENCOUNTER — Other Ambulatory Visit: Payer: Self-pay | Admitting: Internal Medicine

## 2023-09-10 DIAGNOSIS — I509 Heart failure, unspecified: Secondary | ICD-10-CM | POA: Diagnosis not present

## 2023-09-10 NOTE — Telephone Encounter (Signed)
Medication Refill -  Most Recent Primary Care Visit:  Provider: Jonah Blue B  Department: [facility]  Visit Type: OFFICE VISIT  Date: 07/25/2023  Medication: polyethylene glycol (MIRALAX / GLYCOLAX) 17 g packet   Has the patient contacted their pharmacy? No (Agent: If no, request that the patient contact the pharmacy for the refill. If patient does not wish to contact the pharmacy document the reason why and proceed with request.)   Is this the correct pharmacy for this prescription? Yes If no, delete pharmacy and type the correct one.  This is the patient's preferred pharmacy:    [facility] #[id number] - [place], [place] - [address] [place] [address] Phone: [phone] Fax: [phone]   Has the prescription been filled recently? Yes  Is the patient out of the medication? Yes  Has the patient been seen for an appointment in the last year OR does the patient have an upcoming appointment? Yes  Can we respond through MyChart? Yes  Agent: Please be advised that Rx refills may take up to 3 business days. We ask that you follow-up with your pharmacy.

## 2023-09-11 ENCOUNTER — Other Ambulatory Visit: Payer: Self-pay | Admitting: Internal Medicine

## 2023-09-11 DIAGNOSIS — I509 Heart failure, unspecified: Secondary | ICD-10-CM | POA: Diagnosis not present

## 2023-09-11 DIAGNOSIS — Z7689 Persons encountering health services in other specified circumstances: Secondary | ICD-10-CM | POA: Diagnosis not present

## 2023-09-11 NOTE — Telephone Encounter (Signed)
Duplicate request Requested Prescriptions  Pending Prescriptions Disp Refills   polyethylene glycol (MIRALAX / GLYCOLAX) 17 g packet 30 packet 3    Sig: Take 17 g by mouth daily as needed.     Gastroenterology:  [facility] Passed - 09/10/2023 11:01 AM      Passed - Valid encounter within last 12 months    Recent Outpatient Visits           1 month ago Class 3 severe obesity with serious comorbidity and body mass index (BMI) of 45.0 to 49.9 in adult, unspecified obesity type [facility])   [facility] - [facility] Dept Of [place]. [facility] Marcine Matar, MD   3 months ago Anxiety and depression   [facility] [place] - A Dept Of [place]. [facility] [place], [place]   4 months ago Mixed stress and urge urinary incontinence   [facility] [place] [facility] - A Dept Of [place]. [facility] Marcine Matar, MD   5 months ago Vaginal itching   [facility] [place] - A Dept Of [place]. [facility] Marcine Matar, MD   5 months ago Administrative encounter   [facility] - A Dept Of . [facility] Marcine Matar, MD       Future Appointments             In 2 weeks Marcine Matar, MD [facility] [place] - A Dept Of [place]igha Bridegroom. [facility]   In 1 month Hochrein, Fayrene Fearing, MD [facility]

## 2023-09-11 NOTE — Telephone Encounter (Signed)
Requested Prescriptions  Pending Prescriptions Disp Refills   polyethylene glycol powder (GLYCOLAX/MIRALAX) 17 GM/SCOOP powder [Pharmacy Med Name: POLYETH GLYCOL [id number] NF POWDER 510GM] 510 g 3    Sig: MIX 17 GRAMS WITH LIQUID AND DRINK EVERY DAY AS NEEDED     Gastroenterology:  Laxatives Passed - 09/11/2023  9:28 AM      Passed - Valid encounter within last 12 months    Recent Outpatient Visits           1 month ago Class 3 severe obesity with serious comorbidity and body mass index (BMI) of 45.0 to 49.9 in adult, unspecified obesity type [facility])   [facility] - [facility] Dept Of [place]. [facility] Marcine Matar, MD   3 months ago Anxiety and depression   [place] [facility] - A Dept Of [place]. [facility] [place], [place]   4 months ago Mixed stress and urge urinary incontinence   [place] [facility] - A Dept Of [place]. [facility] Marcine Matar, MD   5 months ago Vaginal itching   [place] [facility] [place] - A Dept Of [place]. [facility] Marcine Matar, MD   5 months ago Administrative encounter   [place] [facility] [place] - A Dept Of [place]. [facility] Marcine Matar, MD       Future Appointments             In 2 weeks Marcine Matar, MD [facility] [place] - A Dept Of [place]. [facility]   In 1 month Hochrein, Fayrene Fearing, MD [facility] at [facility]

## 2023-09-11 NOTE — Telephone Encounter (Signed)
Duplicate - see attached 

## 2023-09-12 DIAGNOSIS — I509 Heart failure, unspecified: Secondary | ICD-10-CM | POA: Diagnosis not present

## 2023-09-12 DIAGNOSIS — Z7689 Persons encountering health services in other specified circumstances: Secondary | ICD-10-CM | POA: Diagnosis not present

## 2023-09-13 DIAGNOSIS — I509 Heart failure, unspecified: Secondary | ICD-10-CM | POA: Diagnosis not present

## 2023-09-14 DIAGNOSIS — I509 Heart failure, unspecified: Secondary | ICD-10-CM | POA: Diagnosis not present

## 2023-09-15 DIAGNOSIS — I509 Heart failure, unspecified: Secondary | ICD-10-CM | POA: Diagnosis not present

## 2023-09-16 DIAGNOSIS — I509 Heart failure, unspecified: Secondary | ICD-10-CM | POA: Diagnosis not present

## 2023-09-17 DIAGNOSIS — I509 Heart failure, unspecified: Secondary | ICD-10-CM | POA: Diagnosis not present

## 2023-09-18 DIAGNOSIS — I509 Heart failure, unspecified: Secondary | ICD-10-CM | POA: Diagnosis not present

## 2023-09-19 DIAGNOSIS — I509 Heart failure, unspecified: Secondary | ICD-10-CM | POA: Diagnosis not present

## 2023-09-20 DIAGNOSIS — I509 Heart failure, unspecified: Secondary | ICD-10-CM | POA: Diagnosis not present

## 2023-09-21 DIAGNOSIS — I509 Heart failure, unspecified: Secondary | ICD-10-CM | POA: Diagnosis not present

## 2023-09-22 DIAGNOSIS — I509 Heart failure, unspecified: Secondary | ICD-10-CM | POA: Diagnosis not present

## 2023-09-23 DIAGNOSIS — I509 Heart failure, unspecified: Secondary | ICD-10-CM | POA: Diagnosis not present

## 2023-09-24 DIAGNOSIS — I509 Heart failure, unspecified: Secondary | ICD-10-CM | POA: Diagnosis not present

## 2023-09-25 DIAGNOSIS — I509 Heart failure, unspecified: Secondary | ICD-10-CM | POA: Diagnosis not present

## 2023-09-26 ENCOUNTER — Ambulatory Visit: Payer: Medicaid Other | Attending: Internal Medicine | Admitting: Internal Medicine

## 2023-09-26 ENCOUNTER — Encounter: Payer: Self-pay | Admitting: Internal Medicine

## 2023-09-26 VITALS — BP 127/80 | HR 100 | Ht 62.0 in | Wt 245.6 lb

## 2023-09-26 DIAGNOSIS — F411 Generalized anxiety disorder: Secondary | ICD-10-CM

## 2023-09-26 DIAGNOSIS — I509 Heart failure, unspecified: Secondary | ICD-10-CM | POA: Diagnosis not present

## 2023-09-26 DIAGNOSIS — F331 Major depressive disorder, recurrent, moderate: Secondary | ICD-10-CM

## 2023-09-26 DIAGNOSIS — Z7689 Persons encountering health services in other specified circumstances: Secondary | ICD-10-CM | POA: Diagnosis not present

## 2023-09-26 NOTE — Progress Notes (Signed)
Patient ID: Stephanie Reilly, female    DOB: October 19, 1970  MRN: [id number]  CC: Weight Check   Subjective: Stephanie Reilly is a 52 y.o. female who presents for chronic ds management. 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  History of Present Illness   The patient, with a history of obesity and pre-diabetes, reports significant improvement since starting [facility] for weight loss. She has not experienced any side effects and notes a decrease in appetite. She has been adhering to a daily walking regimen and has noticed a decrease in clothing size and weight, from 246 lbs to 238 lbs. She is excited about her progress and plans to start gym workouts soon. She has also been doing exercises at home with hand weights and exercises learned from physical therapy.  In addition to the weight loss, she has been monitoring her glucose levels, which have been between 72 and 101. The highest reading of 101 was before a meal, and after eating and exercising, it dropped to 84. She is currently on the 0.5 mg dose of Wegovy and is open to increasing the dose to 1.0 mg.  The patient also mentions an upcoming virtual meeting with [facility] for management of depression and anxiety.      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 100 each 12   apixaban (ELIQUIS) 5 MG TABS tablet Take 1 tablet (5 mg total) by mouth 2 (two) times daily. 180 tablet 3   Ascorbic Acid (VITAMIN C) 100 MG tablet Take 100 mg by mouth daily.     Biotin (BIOTIN MAXIMUM STRENGTH) 10 MG TABS Take 10 mg by mouth daily.     Blood Glucose Monitoring Suppl (ACCU-CHEK GUIDE) w/Device KIT Check blood sugars 7 times daily. 1 kit 0   carvedilol (COREG) 12.5 MG tablet Take 1 tablet (12.5 mg total) by mouth 2 (two) times daily with a meal. 180 tablet 3   cetirizine (ZYRTEC) 10 MG tablet Take 10 mg by mouth daily.     dapagliflozin propanediol (FARXIGA) 10 MG TABS tablet Take 1 tablet (10 mg total) by mouth daily. 90 tablet 3   fluticasone (FLONASE) 50 MCG/ACT nasal spray Place 1 spray into both nostrils daily.     glucose blood (ACCU-CHEK GUIDE TEST) test strip Check blood sugars 7 times daily. 100 each 12   polyethylene glycol powder (GLYCOLAX/MIRALAX) 17 GM/SCOOP powder MIX 17 GRAMS WITH LIQUID AND DRINK EVERY DAY AS NEEDED 510 g 3   rosuvastatin (CRESTOR) 20 MG tablet Take 1 tablet (20 mg total) by mouth daily.  90 tablet 3   sacubitril-valsartan (ENTRESTO) 97-103 MG Take 1 tablet by mouth 2 (two) times daily. 60 tablet 3   Semaglutide-Weight Management (WEGOVY) 0.5 MG/0.5ML SOAJ Inject 0.5 mg into the skin once a week. 2 mL 1   senna-docusate (SENOKOT-S) 8.6-50 MG tablet Take 1 tablet by mouth at bedtime as needed for mild constipation. 30 tablet 0   spironolactone (ALDACTONE) 50 MG tablet Take 1 tablet (50 mg total) by mouth daily. 30 tablet 11   torsemide (DEMADEX) 20 MG tablet Take 1 tablet (20 mg total) by mouth daily as needed (for increased edema). 30 tablet 0    No current facility-administered medications on file prior to visit.    Allergies  Allergen Reactions   Paroxetine Hcl Anaphylaxis   Prednisone Shortness Of Breath and Other (See Comments)    Confusion, "messes with mental," muscles feel weird, fatigue, insomnia   Chocolate Other (See Comments)    Unknown    Ibuprofen Other (See Comments)    Unknown    Iron Other (See Comments)    GI upset (reported 08/04/2017). Pt clarifies had constipation, nausea.  Stool softeners did not help.    Latex Other (See Comments)    Unknown    Lisinopril Hives   Naproxen Other (See Comments)    Unknown    Tramadol Hives   Codeine Rash   Penicillins Rash   Serotonin Nausea Only, Swelling, Anxiety, Rash and Other (See Comments)    GI intolerence    Social History   Socioeconomic History   Marital status: Single    Spouse name: Not on file   Number of children: 3   Years of education: Not on file   Highest education level: Associate degree: occupational, Scientist, product/process development, or vocational program  Occupational History   Occupation: self employed  Tobacco Use   Smoking status: Former    Current packs/day: 0.00    Average packs/day: 0.5 packs/day for 11.0 years (5.5 ttl pk-yrs)    Types: Cigarettes    Start date: 10/2010    Quit date: 10/2021    Years since quitting: 1.9    Passive exposure: Past   Smokeless tobacco: Never  Vaping Use   Vaping status: Never Used  Substance and Sexual Activity   Alcohol use: Never   Drug use: Yes    Types: Marijuana    Comment: routine use of CBD edibles   Sexual activity: Not on file  Other Topics Concern   Not on file  Social History Narrative   Not on file   Social Drivers of Health   Financial Resource Strain: High Risk (09/22/2023)   Overall Financial Resource Strain (CARDIA)    Difficulty of Paying Living Expenses: Very hard  Food Insecurity: Food Insecurity Present (09/22/2023)   Hunger Vital Sign    Worried About Running Out of Food in the  Last Year: Often true    Ran Out of Food in the Last Year: Often true  Transportation Needs: No Transportation Needs (09/22/2023)   PRAPARE - Administrator, Civil Service (Medical): No    Lack of Transportation (Non-Medical): No  Recent Concern: Transportation Needs - Unmet Transportation Needs (07/21/2023)   PRAPARE - Transportation    Lack of Transportation (Medical): Yes    Lack of Transportation (Non-Medical): Yes  Physical Activity: Sufficiently Active (09/22/2023)   Exercise Vital Sign    Days of Exercise per Week: 7 days    Minutes of Exercise per Session: 30 min  Recent Concern:  Physical Activity - Insufficiently Active (07/25/2023)   Exercise Vital Sign    Days of Exercise per Week: 3 days    Minutes of Exercise per Session: 20 min  Stress: Stress Concern Present (09/22/2023)   [employer] of Occupational Health - Occupational Stress Questionnaire    Feeling of Stress : Very much  Social Connections: Socially Isolated (09/22/2023)   Social Connection and Isolation Panel [NHANES]    Frequency of Communication with Friends and Family: Once a week    Frequency of Social Gatherings with Friends and Family: Once a week    Attends Religious Services: 1 to 4 times per year    Active Member of [employer] or Organizations: No    Attends Banker Meetings: Never    Marital Status: Never married  Intimate Partner Violence: Not At Risk (07/25/2023)   Humiliation, Afraid, Rape, and Kick questionnaire    Fear of Current or Ex-Partner: No    Emotionally Abused: No    Physically Abused: No    Sexually Abused: No    Family History  Problem Relation Age of Onset   Hypertension Mother    Heart failure Mother    COPD Mother    Diabetes Paternal Grandfather     Past Surgical History:  Procedure Laterality Date   ABDOMINAL HYSTERECTOMY     IR CT HEAD LTD  02/15/2023   IR PERCUTANEOUS ART THROMBECTOMY/INFUSION INTRACRANIAL INC DIAG ANGIO  02/15/2023   IR US  GUIDE VASC ACCESS RIGHT  02/15/2023   RADIOLOGY WITH ANESTHESIA N/A 02/15/2023   Procedure: IR WITH ANESTHESIA;  Surgeon: Radiologist, Medication, MD;  Location: [facility] OR;  Service: Radiology;  Laterality: N/A;   RIGHT/LEFT HEART CATH AND CORONARY ANGIOGRAPHY N/A 02/10/2023   Procedure: RIGHT/LEFT HEART CATH AND CORONARY ANGIOGRAPHY;  Surgeon: [place], Peter M, MD;  Location: [facility] INVASIVE CV LAB;  Service: Cardiovascular;  Laterality: N/A;   TONSILLECTOMY     TUBAL LIGATION      ROS: Review of Systems Negative except as stated above  PHYSICAL EXAM: BP 127/80   Pulse 100   Ht 5\' 2"  (1.575 m)   Wt 245 lb 9.6 oz (111.4 kg) Comment: 238.6lbs patient reported  SpO2 99%   BMI 44.92 kg/m   Wt Readings from Last 3 Encounters:  09/26/23 245 lb 9.6 oz (111.4 kg)  08/20/23 244 lb 3.2 oz (110.8 kg)  07/28/23 240 lb (108.9 kg)    Physical Exam  General appearance - alert, well appearing, and in no distress Mental status - normal mood, behavior, speech, dress, motor activity, and thought processes     07/25/2023    4:10 PM 06/04/2023   11:05 AM 04/14/2023    4:14 PM  Depression screen PHQ 2/9  Decreased Interest 2 2 2   Down, Depressed, Hopeless 3 2 2   PHQ - 2 Score 5 4 4   Altered sleeping 3 0 0  Tired, decreased energy 3 0 0  Change in appetite 2 0 0  Feeling bad or failure about yourself  2 2 0  Trouble concentrating 1 0 1  Moving slowly or fidgety/restless 2 0 0  Suicidal thoughts 0 0 0  PHQ-9 Score 18 6 5   Difficult doing work/chores Extremely dIfficult         Latest Ref Rng & Units 07/22/2023    3:30 PM 06/12/2023    8:59 AM 06/03/2023   10:36 AM  CMP  Glucose 70 - 99 mg/dL 098  119  147   BUN 6 -  20 mg/dL 14  15  10    Creatinine 0.44 - 1.00 mg/dL 4.09  8.11  9.14   Sodium 135 - 145 mmol/L 137  137  138   Potassium 3.5 - 5.1 mmol/L 4.6  3.6  3.2   Chloride 98 - 111 mmol/L 104  104  102   CO2 22 - 32 mmol/L 25  26  27    Calcium 8.9 - 10.3 mg/dL 78.2  9.9  95.6    Lipid Panel      Component Value Date/Time   CHOL 155 02/16/2023 0945   TRIG 62 02/16/2023 0945   HDL 45 02/16/2023 0945   CHOLHDL 3.4 02/16/2023 0945   VLDL 12 02/16/2023 0945   LDLCALC 98 02/16/2023 0945    CBC    Component Value Date/Time   WBC 10.4 02/28/2023 1028   RBC 5.14 (H) 02/28/2023 1028   HGB 13.7 02/28/2023 1028   HCT 43.5 02/28/2023 1028   PLT 333 02/28/2023 1028   MCV 84.6 02/28/2023 1028   MCH 26.7 02/28/2023 1028   MCHC 31.5 02/28/2023 1028   RDW 15.3 02/28/2023 1028   LYMPHSABS 3.3 02/15/2023 1245   MONOABS 0.9 02/15/2023 1245   EOSABS 0.1 02/15/2023 1245   BASOSABS 0.1 02/15/2023 1245    ASSESSMENT AND PLAN:  1. Obesity, Class III, BMI 40-49.9 (morbid obesity) (HCC) (Primary) Pt reports wgh loss based on her scale at home and improved exercise tolerance since starting [facility]. No reported side effects.  -Continue Wegovy 0.5mg , with plan to increase to 1mg  after completion of the fourth shot of the current dose. Patient to send MyChart message to confirm completion and tolerance of current dose. -Encourage continuation of regular physical activity and healthy dietary habits.  2. GAD (generalized anxiety disorder) 3. Major depressive disorder, recurrent episode, moderate (HCC) Keep upcoming appointment with behavioral health early next week.    Patient was given the opportunity to ask questions.  Patient verbalized understanding of the plan and was able to repeat key elements of the plan.   This documentation was completed using Paediatric nurse.  Any transcriptional errors are unintentional.  No orders of the defined types were placed in this encounter.    Requested Prescriptions    No prescriptions requested or ordered in this encounter    Return in about 3 months (around 12/25/2023).  Jonah Blue, MD, FACP

## 2023-09-27 DIAGNOSIS — I509 Heart failure, unspecified: Secondary | ICD-10-CM | POA: Diagnosis not present

## 2023-09-28 DIAGNOSIS — I509 Heart failure, unspecified: Secondary | ICD-10-CM | POA: Diagnosis not present

## 2023-09-29 ENCOUNTER — Ambulatory Visit (HOSPITAL_COMMUNITY): Payer: Medicaid Other | Admitting: Clinical

## 2023-09-29 ENCOUNTER — Encounter (HOSPITAL_COMMUNITY): Payer: Self-pay

## 2023-09-29 DIAGNOSIS — F331 Major depressive disorder, recurrent, moderate: Secondary | ICD-10-CM | POA: Diagnosis not present

## 2023-09-29 DIAGNOSIS — I509 Heart failure, unspecified: Secondary | ICD-10-CM | POA: Diagnosis not present

## 2023-09-29 NOTE — Progress Notes (Signed)
Comprehensive Clinical Assessment (CCA) Note  09/29/2023 Lydiann Hails [id number] Virtual Visit via Video Note  I connected with [name] on 09/29/23 at  1:00 PM EST by a video enabled telemedicine application and verified that I am speaking with the correct person using two identifiers.  Location: Patient: home Provider: office   I discussed the limitations of evaluation and management by telemedicine and the availability of in person appointments. The patient expressed understanding and agreed to proceed.   Follow Up Instructions:    I discussed the assessment and treatment plan with the patient. The patient was provided an opportunity to ask questions and all were answered. The patient agreed with the plan and demonstrated an understanding of the instructions.   The patient was advised to call back or seek an in-person evaluation if the symptoms worsen or if the condition fails to improve as anticipated.  I provided 45 minutes of non-face-to-face time during this encounter.   Loree Fee, LCSW   Chief Complaint:  Chief Complaint  Patient presents with   Depression   Anxiety   Visit Diagnosis:  Major depressive disorder, recurrent episode, moderate with anxious distress  Interpretive Summary:  Client is a 52 year old female presenting to the [facility] to establish outpatient care. Client is presenting by her own referral. Client reported a diagnosis history of major depression and generalized anxiety disorder. Client reported she saw a psychiatrist in a trial in 2007 and she learned about her behaviors being related to depression. Client reported she started seeing her every few weeks. Client reported she has been tried on a handful of psych medications, but it did not agree with her body chemistry. Client reported she once medicated with hemp products and cannabis but stopped due to work. Client reported she was unable to use medicine or  cannabis she suppressed how she has felt over time. Client reported she does not have an outlet to release her thoughts. 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 Client reported since getting older her tolerance for being around people is very low.  Client reported her thoughts pertaining to other people include what germs they could possibly pass along to her as well as possible judgments that they may be thinking about her. Client reported a lot of thoughts and feeling related to grief about loved ones and losing herself over time. Client presented oriented x 5, appropriately dressed, and friendly.  Client denied hallucinations, delusions, suicidal and homicidal ideations.  Client was screened for pain, nutrition, [place] suicide severity and the following S DOH:    09/29/2023    3:13 PM 07/25/2023    4:10 PM 06/04/2023   11:05 AM 02/21/2023    2:24 PM  GAD 7 : Generalized Anxiety Score  Nervous, Anxious, on Edge 3 3 2 1   Control/stop worrying 2 3 0 1  Worry too much - different things 2 3 0 0  Trouble relaxing 3 3 1 2   Restless 3 3 0 0  Easily annoyed or irritable 3 3 1 2   Afraid - awful might happen 3 3 0 0  Total GAD 7 Score 19 21 4 6   Anxiety Difficulty Extremely difficult Extremely difficult Somewhat difficult    Flowsheet Row Counselor from 09/29/2023 in [facility]  PHQ-9 Total Score 10       Treatment recommendations: Individual counseling.  Client declined psychiatry.  Therapist provided  information on format of appointment (virtual or face to face).   The client was advised to call back or seek an in-person evaluation if the symptoms worsen or if the condition fails to improve as anticipated before the next scheduled appointment. Client was in agreement with treatment recommendations.  CCA Biopsychosocial Intake/Chief Complaint:  client reported in may 2024 she [facility]ig [facility]. client reported a history of anxiety and depression. client reported in 2007 she came to understanding  Current Symptoms/Problems: cliet reported depression, irritability, mood swings, isolate  Patient Reported Schizophrenia/Schizoaffective Diagnosis in Past: No  Strengths: No data recorded Preferences: No data recorded Abilities: No data recorded  Type of Services Patient Feels are Needed: No data recorded  Initial Clinical Notes/Concerns: No data recorded  Mental Health Symptoms Depression:  Change in energy/activity   Duration of Depressive symptoms: Greater than two weeks   Mania:  None   Anxiety:   Tension; Worrying; Difficulty concentrating   Psychosis:  None   Duration of Psychotic symptoms: No data recorded  Trauma:  None   Obsessions:  None   Compulsions:  None   Inattention:  None   Hyperactivity/Impulsivity:  None   Oppositional/Defiant Behaviors:  None   Emotional Irregularity:  None   Other Mood/Personality Symptoms:  No data recorded   Mental Status Exam Appearance and self-care  Stature:  Average   Weight:  Average weight   Clothing:  Casual   Grooming:  Normal   Cosmetic use:  Age appropriate   Posture/gait:  Normal   Motor activity:  Not Remarkable   Sensorium  Attention:  Normal   Concentration:  Normal   Orientation:  X5   Recall/memory:  Normal   Affect and Mood  Affect:  Depressed   Mood:  Depressed   Relating  Eye contact:  Normal   Facial expression:  Depressed   Attitude toward examiner:  Cooperative   Thought and Language  Speech flow: Clear and Coherent   Thought content:  Appropriate to Mood and Circumstances   Preoccupation:  None   Hallucinations:  None   Organization:  No data recorded  Company secretary of Knowledge:  Good   Intelligence:  Average   Abstraction:  Normal   Judgement:  Good   Reality Testing:  Adequate   Insight:  Good   Decision  Making:  Normal   Social Functioning  Social Maturity:  Responsible   Social Judgement:  Normal   Stress  Stressors:  Illness   Coping Ability:  Normal   Skill Deficits:  Self-care   Supports:  Support needed     Religion: Religion/Spirituality Are You A Religious Person?: Yes  Leisure/Recreation: Leisure / Recreation Do You Have Hobbies?: No  Exercise/Diet: Exercise/Diet Do You Exercise?: No Have You Gained or Lost A Significant Amount of Weight in the Past Six Months?: No Do You Follow a Special Diet?: Yes Type of Diet: restricted to accomodate her health condition Do You Have Any Trouble Sleeping?: Yes   CCA Employment/Education Employment/Work Situation: Employment / Work Situation Employment Situation: On disability  Education: Education Did Garment/textile technologist From [employer]?: Yes Did Theme park manager?: Yes   CCA Family/Childhood History Family and Relationship History: Family history Marital status: Single Does patient have children?: Yes How many children?: 3  Childhood History:  Childhood History Additional childhood history information: client reported she is from [place]. client reported she was raised by her mother and father. client  reported her childhood was good. Patient's description of current relationship with people who raised him/her: client reported she lives with her 54 year old mother. Does patient have siblings?: Yes Did patient suffer any verbal/emotional/physical/sexual abuse as a child?: No Did patient suffer from severe childhood neglect?: No Has patient ever been sexually abused/assaulted/raped as an adolescent or adult?: No Was the patient ever a victim of a crime or a disaster?: No Witnessed domestic violence?: No Has patient been affected by domestic violence as an adult?: No  Child/Adolescent Assessment:     CCA Substance Use Alcohol/Drug Use: Alcohol / Drug Use History of alcohol / drug use?: No history of  alcohol / drug abuse                         ASAM's:  Six Dimensions of Multidimensional Assessment  Dimension 1:  Acute Intoxication and/or Withdrawal Potential:      Dimension 2:  Biomedical Conditions and Complications:      Dimension 3:  Emotional, Behavioral, or Cognitive Conditions and Complications:     Dimension 4:  Readiness to Change:     Dimension 5:  Relapse, Continued use, or Continued Problem Potential:     Dimension 6:  Recovery/Living Environment:     ASAM Severity Score:    ASAM Recommended Level of Treatment:     Substance use Disorder (SUD)    Recommendations for Services/Supports/Treatments: Recommendations for Services/Supports/Treatments Recommendations For Services/Supports/Treatments: Individual Therapy  DSM5 Diagnoses: Patient Active Problem List   Diagnosis Date Noted   VT (ventricular tachycardia) (HCC) 05/08/2023   Osteoarthritis of both knees 03/03/2023   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) 02/15/2023   Shock circulatory (HCC) 02/15/2023   Iron deficiency anemia 02/09/2023   Heart failure (HCC) 02/08/2023   Hypokalemia 02/08/2023   Acute on chronic combined systolic and diastolic CHF (congestive heart failure) (HCC) 02/07/2023   Essential hypertension 02/07/2023   Elevated troponin 02/07/2023   Obesity, Class III, BMI 40-49.9 (morbid obesity) (HCC) 02/07/2023    Patient Centered Plan: Patient is on the following Treatment Plan(s):  Depression   Referrals to Alternative Service(s): Referred to Alternative Service(s):   Place:   Date:   Time:    Referred to Alternative Service(s):   Place:   Date:   Time:    Referred to Alternative Service(s):   Place:   Date:   Time:    Referred to Alternative Service(s):   Place:   Date:   Time:      Collaboration of Care: Referral or follow-up with counselor/therapist [facility]  Patient/Guardian was advised Release of Information must be obtained prior to any record  release in order to collaborate their care with an outside provider. Patient/Guardian was advised if they have not already done so to contact the registration department to sign all necessary forms in order for [place] to release information regarding their care.   Consent: Patient/Guardian gives verbal consent for treatment and assignment of benefits for services provided during this visit. Patient/Guardian expressed understanding and agreed to proceed.   Neena Rhymes Shanta Hartner, LCSW

## 2023-09-30 DIAGNOSIS — I509 Heart failure, unspecified: Secondary | ICD-10-CM | POA: Diagnosis not present

## 2023-10-02 DIAGNOSIS — I509 Heart failure, unspecified: Secondary | ICD-10-CM | POA: Diagnosis not present

## 2023-10-03 DIAGNOSIS — I509 Heart failure, unspecified: Secondary | ICD-10-CM | POA: Diagnosis not present

## 2023-10-04 DIAGNOSIS — I509 Heart failure, unspecified: Secondary | ICD-10-CM | POA: Diagnosis not present

## 2023-10-05 DIAGNOSIS — I509 Heart failure, unspecified: Secondary | ICD-10-CM | POA: Diagnosis not present

## 2023-10-06 ENCOUNTER — Other Ambulatory Visit: Payer: Self-pay | Admitting: Internal Medicine

## 2023-10-06 ENCOUNTER — Ambulatory Visit: Payer: Self-pay

## 2023-10-06 ENCOUNTER — Telehealth: Payer: Self-pay

## 2023-10-06 ENCOUNTER — Telehealth: Payer: Self-pay | Admitting: Internal Medicine

## 2023-10-06 ENCOUNTER — Other Ambulatory Visit: Payer: Self-pay | Admitting: Pharmacist

## 2023-10-06 DIAGNOSIS — I509 Heart failure, unspecified: Secondary | ICD-10-CM | POA: Diagnosis not present

## 2023-10-06 MED ORDER — WEGOVY 1 MG/0.5ML ~~LOC~~ SOAJ: 1.0000 mg | SUBCUTANEOUS | 1 refills | Status: DC

## 2023-10-06 NOTE — Telephone Encounter (Signed)
Copied from CRM [id number]. Topic: General - Other >> Oct 06, 2023 10:20 AM [place] B wrote: Reason for CRM: pt called in about refil of Wegovy that has been requested, she states pharmacy can only hold it one day, so needs it approved toda.

## 2023-10-06 NOTE — Telephone Encounter (Signed)
Patient has completed 1 month of Wegovy at the 0.5 mg weekly dose. Tolerating well. Rxn sent as requested for 1 mg weekly to requested pharmacy.

## 2023-10-06 NOTE — Telephone Encounter (Signed)
Please write order for 1 mg Wegovy. [address] [facility] [place].

## 2023-10-06 NOTE — Telephone Encounter (Signed)
Copied from CRM [id number]. Topic: General - Other >> Oct 06, 2023  1:44 PM Phill Myron wrote: AttnFranky Macho  Reason for CRM: Questions regarding  [facility] medication

## 2023-10-06 NOTE — Telephone Encounter (Signed)
Medication Refill -  Most Recent Primary Care Visit:  Provider: Jonah Blue B  Department: [facility]  Visit Type: OFFICE VISIT  Date: 09/26/2023  Medication: 1 MG Wegovy, pt states  Dr Laural Benes switched her to 1mg  form the 0.5, and there is only 1 box left at the pharmacy  Has the patient contacted their pharmacy? yes ((Agent: If yes, when and what did the pharmacy advise?)contact pcp  Is this the correct pharmacy for this prescription? yes  This is the patient's preferred pharmacy:   [id number] [place], [place] - [address] AT [facility] [address] [place] [address] Phone: [phone] Fax: [phone]   Has the prescription been filled recently? no  Is the patient out of the medication? yes  Has the patient been seen for an appointment in the last year OR does the patient have an upcoming appointment? yes  Can we respond through MyChart? yes  Agent: Please be advised that Rx refills may take up to 3 business days. We ask that you follow-up with your pharmacy.

## 2023-10-06 NOTE — Telephone Encounter (Signed)
Pt called saying there is only one box left and could it be sent in today

## 2023-10-06 NOTE — Telephone Encounter (Signed)
Message from Phill Myron sent at 10/06/2023  1:51 PM EST  Summary: sinus headache   Sinus headaches, for three days taking flonase works for draining but does not work for head pain,  ----- Message from Phill Myron sent at 10/06/2023  1:49 PM EST ----- Sinus headaches, for three days taking flonase works for draining but does not work for head pain,          Chief Complaint: sinus pressure  to forehead and headache Symptoms: runny nose, fever 99-101 Frequency: Friday Pertinent Negatives: Patient denies SOB, chest pain  Disposition: [] ED /[] Urgent Care (no appt availability in office) / [] Appointment(In office/virtual)/ []  [facility]/ [x] Home Care/ [] Refused Recommended Disposition /[]  Mobile Bus/ []  Follow-up with PCP Additional Notes: discussed when to call back  Reason for Disposition  [1] Sinus congestion as part of a cold AND [2] present < 10 days  Answer Assessment - Initial Assessment Questions 1. LOCATION: "Where does it hurt?"      Above nose 2. ONSET: "When did the sinus pain start?"  (e.g., hours, days)      Friday 3. SEVERITY: "How bad is the pain?"   (Scale 1-10; mild, moderate or severe)   - MILD (1-3): doesn't interfere with normal activities    - MODERATE (4-7): interferes with normal activities (e.g., work or school) or awakens from sleep   - SEVERE (8-10): excruciating pain and patient unable to do any normal activities        Moderate to severe 5. NASAL CONGESTION: "Is the nose blocked?" If Yes, ask: "Can you open it or must you breathe through your mouth?"     Nasal congestion 6. NASAL DISCHARGE: "Do you have discharge from your nose?" If so ask, "What color?"     Occasional  7. FEVER: "Do you have a fever?" If Yes, ask: "What is it, how was it measured, and when did it start?"      When takes a Tylenol gets a fever 99-101 8. OTHER SYMPTOMS: "Do you have any other symptoms?" (e.g., sore throat, cough, earache,  difficulty breathing)     Sinus pressure to forehead  Protocols used: Sinus Pain or Congestion-A-AH

## 2023-10-07 DIAGNOSIS — I509 Heart failure, unspecified: Secondary | ICD-10-CM | POA: Diagnosis not present

## 2023-10-07 MED ORDER — WEGOVY 1 MG/0.5ML ~~LOC~~ SOAJ: 1.0000 mg | SUBCUTANEOUS | 1 refills | Status: DC

## 2023-10-07 NOTE — Addendum Note (Signed)
 Addended by: Guy Franco on: 10/07/2023 12:19 PM   Modules accepted: Orders

## 2023-10-08 DIAGNOSIS — Z419 Encounter for procedure for purposes other than remedying health state, unspecified: Secondary | ICD-10-CM | POA: Diagnosis not present

## 2023-10-08 DIAGNOSIS — I509 Heart failure, unspecified: Secondary | ICD-10-CM | POA: Diagnosis not present

## 2023-10-09 DIAGNOSIS — I509 Heart failure, unspecified: Secondary | ICD-10-CM | POA: Diagnosis not present

## 2023-10-09 NOTE — Telephone Encounter (Signed)
 Called & spoke to the patient. Verified name & DOB. Informed that a refill of the requested prescription was sent to the pharmacy. Patient expressed verbal understanding and will pick up the prescription on Monday. No further assistance needed at this time

## 2023-10-10 DIAGNOSIS — I509 Heart failure, unspecified: Secondary | ICD-10-CM | POA: Diagnosis not present

## 2023-10-10 MED ORDER — WEGOVY 1 MG/0.5ML ~~LOC~~ SOAJ: 1.0000 mg | SUBCUTANEOUS | 2 refills | Status: DC

## 2023-10-10 NOTE — Telephone Encounter (Signed)
 Requested medications are due for refill today.  No - wrong dosage  Requested medications are on the active medications list.  no  Last refill.   Future visit scheduled.   yes  Notes to clinic.  Medication with correct dosage was sent to the wrong pharmacy. Please review.    Requested Prescriptions  Pending Prescriptions Disp Refills   Semaglutide -Weight Management (WEGOVY ) 0.5 MG/0.5ML SOAJ 2 mL 1    Sig: Inject 0.5 mg into the skin once a week.     Endocrinology:  Diabetes - GLP-1 Receptor Agonists - semaglutide  Passed - 10/10/2023  8:34 AM      Passed - HBA1C in normal range and within 180 days    HbA1c, POC (controlled diabetic range)  Date Value Ref Range Status  07/25/2023 6.3 0.0 - 7.0 % Final         Passed - Cr in normal range and within 360 days    Creatinine, Ser  Date Value Ref Range Status  07/22/2023 0.75 0.44 - 1.00 mg/dL Final         Passed - Valid encounter within last 6 months    Recent Outpatient Visits           2 weeks ago Obesity, Class III, BMI 40-49.9 (morbid obesity) (HCC)   [facility] Of [place]. [facility] Vicci Sober B, MD   2 months ago Class 3 severe obesity with serious comorbidity and body mass index (BMI) of 45.0 to 49.9 in adult, unspecified obesity type [facility])   [facility] Dept Of [place]. [facility] Vicci Sober NOVAK, MD   4 months ago Anxiety and depression   [facility] Dept Of [place]. [facility], [place], [place]   5 months ago Mixed stress and urge urinary incontinence   [facility] Dept Of [place]. [facility] Vicci Sober NOVAK, MD   5 months ago Vaginal itching   [facility] Dept Of [place]. [facility] Vicci Sober NOVAK, MD       Future Appointments             In 2 weeks Lavona Agent, MD [facility]   In 2 months Vicci Sober NOVAK, MD [facility] - A Dept Of [place]. [facility]

## 2023-10-11 DIAGNOSIS — I509 Heart failure, unspecified: Secondary | ICD-10-CM | POA: Diagnosis not present

## 2023-10-12 DIAGNOSIS — I509 Heart failure, unspecified: Secondary | ICD-10-CM | POA: Diagnosis not present

## 2023-10-13 DIAGNOSIS — I509 Heart failure, unspecified: Secondary | ICD-10-CM | POA: Diagnosis not present

## 2023-10-14 ENCOUNTER — Ambulatory Visit (HOSPITAL_COMMUNITY): Payer: Self-pay | Admitting: Clinical

## 2023-10-14 DIAGNOSIS — I509 Heart failure, unspecified: Secondary | ICD-10-CM | POA: Diagnosis not present

## 2023-10-14 DIAGNOSIS — Z7689 Persons encountering health services in other specified circumstances: Secondary | ICD-10-CM | POA: Diagnosis not present

## 2023-10-15 DIAGNOSIS — I509 Heart failure, unspecified: Secondary | ICD-10-CM | POA: Diagnosis not present

## 2023-10-15 DIAGNOSIS — Z7689 Persons encountering health services in other specified circumstances: Secondary | ICD-10-CM | POA: Diagnosis not present

## 2023-10-16 DIAGNOSIS — Z7689 Persons encountering health services in other specified circumstances: Secondary | ICD-10-CM | POA: Diagnosis not present

## 2023-10-16 DIAGNOSIS — I509 Heart failure, unspecified: Secondary | ICD-10-CM | POA: Diagnosis not present

## 2023-10-17 DIAGNOSIS — Z7689 Persons encountering health services in other specified circumstances: Secondary | ICD-10-CM | POA: Diagnosis not present

## 2023-10-17 DIAGNOSIS — I509 Heart failure, unspecified: Secondary | ICD-10-CM | POA: Diagnosis not present

## 2023-10-18 DIAGNOSIS — I509 Heart failure, unspecified: Secondary | ICD-10-CM | POA: Diagnosis not present

## 2023-10-19 DIAGNOSIS — I509 Heart failure, unspecified: Secondary | ICD-10-CM | POA: Diagnosis not present

## 2023-10-20 DIAGNOSIS — I509 Heart failure, unspecified: Secondary | ICD-10-CM | POA: Diagnosis not present

## 2023-10-20 DIAGNOSIS — Z7689 Persons encountering health services in other specified circumstances: Secondary | ICD-10-CM | POA: Diagnosis not present

## 2023-10-21 ENCOUNTER — Ambulatory Visit (HOSPITAL_COMMUNITY): Payer: Medicaid Other

## 2023-10-21 DIAGNOSIS — I509 Heart failure, unspecified: Secondary | ICD-10-CM | POA: Diagnosis not present

## 2023-10-22 DIAGNOSIS — I509 Heart failure, unspecified: Secondary | ICD-10-CM | POA: Diagnosis not present

## 2023-10-22 DIAGNOSIS — Z7689 Persons encountering health services in other specified circumstances: Secondary | ICD-10-CM | POA: Diagnosis not present

## 2023-10-23 ENCOUNTER — Telehealth: Payer: Self-pay

## 2023-10-23 DIAGNOSIS — I509 Heart failure, unspecified: Secondary | ICD-10-CM | POA: Diagnosis not present

## 2023-10-23 NOTE — Telephone Encounter (Signed)
Copied from CRM [id number]. Topic: General - Other >> Oct 23, 2023  3:51 PM Stephanie Reilly wrote: Stephanie Reilly Duty document received in the mail. I would like to speak with Dr Laural Benes or Nurse, in regards to being excused from [facility], due to the fact I don't like people and I am germophobic

## 2023-10-24 DIAGNOSIS — I509 Heart failure, unspecified: Secondary | ICD-10-CM | POA: Diagnosis not present

## 2023-10-24 DIAGNOSIS — Z7689 Persons encountering health services in other specified circumstances: Secondary | ICD-10-CM | POA: Diagnosis not present

## 2023-10-25 DIAGNOSIS — I509 Heart failure, unspecified: Secondary | ICD-10-CM | POA: Diagnosis not present

## 2023-10-25 NOTE — Telephone Encounter (Signed)
Letter written citing her chronic medical issues including congestive heart failure and significant depression and anxiety as reasonable request to excuse her from jury duty.  Being a germophobe and not liking people are not justifiable reasons to be excused from jury duty. If she is agreeable to the letter I wrote, she can pick up.

## 2023-10-26 DIAGNOSIS — I509 Heart failure, unspecified: Secondary | ICD-10-CM | POA: Diagnosis not present

## 2023-10-26 DIAGNOSIS — E118 Type 2 diabetes mellitus with unspecified complications: Secondary | ICD-10-CM | POA: Insufficient documentation

## 2023-10-26 NOTE — Progress Notes (Unsigned)
Cardiology Office Note:   Date:  10/28/2023  ID:  Stephanie Reilly, DOB [birth year], MRN [id number] PCP: Stephanie Matar, MD  [facility] Providers Cardiologist:  Stephanie Rotunda, MD Electrophysiologist:  Will Jorja Loa, MD {  History of Present Illness:   Stephanie Reilly is a 53 y.o. female with hypertension, hyperlipidemia, type 2 diabetes, obesity and recently diagnosed systolic heart failure.  She was in the hospital with stroke and occlusion of the proximal right M2.  She had PEA arrest and was intubated.    EF was 20%.  Left heart cath demonstrated normal coronaries.    She was treated with Eliquis.  She was managed with meds as below.  He wore a life vest. .  She had an MRI and I reviewed these results.  The ejection fraction was said to be 12%.  The there was a small mid inferior wall subendocardial infarct possible and they could not exclude sarcoid.  A PET scan is ordered.  This was not done. Echo in May demonstrated an EF of less than 20%.    She has an echo ordered for later this month.   MRI previously demonstrated subendocardial LGE in the mid and small and sarcoid could not be excluded.  FDG PET has been ordered but was not done because of insurance problems.  The most recent EF was 30 - 35% on echo in August, 2024   Since she was seen she has been doing well.  She joined the gym.  She is now on a GLP-1 and is having that meds titrated. The patient denies any new symptoms such as chest discomfort, neck or arm discomfort. There has been no new shortness of breath, PND or orthopnea. There have been no reported palpitations, presyncope or syncope.   She did tolerate having her Entresto increased at the last visit.  She is actually due to have an echocardiogram in a couple of days and then follow-up in the Advanced Heart Failure Clinic.    ROS: As stated in the HPI and negative for all other systems.  Studies Reviewed:    EKG:   NA    Risk Assessment/Calculations:               Physical Exam:   VS:  BP 98/74 (BP Location: Left Arm, Patient Position: Sitting, Cuff Size: Large)   Pulse (!) 116   Ht 5' 2.5" (1.588 m)   Wt 246 lb 6.4 oz (111.8 kg)   SpO2 96%   BMI 44.35 kg/m    Wt Readings from Last 3 Encounters:  10/28/23 246 lb 6.4 oz (111.8 kg)  09/26/23 245 lb 9.6 oz (111.4 kg)  08/20/23 244 lb 3.2 oz (110.8 kg)     GEN: Well nourished, well developed in no acute distress NECK: No JVD; No carotid bruits CARDIAC: RRR, no murmurs, rubs, gallops RESPIRATORY:  Clear to auscultation without rales, wheezing or rhonchi  ABDOMEN: Soft, non-tender, non-distended EXTREMITIES:  No edema; No deformity   ASSESSMENT AND PLAN:   Heart Failure with reduced EF:  The patient's has a low blood pressure and I am not going to try to increase her beta-blocker.  However, she does have sinus tachycardia.  She is getting an echocardiogram later this week and has follow-up with Dr. Gasper Lloyd   She would probably benefit from the addition of Corlanor but I will defer to see the results of the echocardiogram first.   I sent a message to Dr. Gasper Lloyd about this.  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  No change in therapy.    DMII:   Management per her primary.  Her A1c is 6.3 prior to starting GLP-1 receptor agonist.    NSVT /PVCs : She has had no symptoms related to this.  There is no plans for device at this point.  Echo is pending as above.    Secondary hypercoaguable state:   She tolerates anticoagulation.  No change in therapy.    He tolerates anticoagulation.        Follow up with me in 6 months  Signed, Stephanie Rotunda, MD

## 2023-10-27 DIAGNOSIS — I509 Heart failure, unspecified: Secondary | ICD-10-CM | POA: Diagnosis not present

## 2023-10-27 NOTE — Telephone Encounter (Signed)
Called & spoke to the patient. Verified name & DOB. Informed of Dr.Johnson's message and that letter is ready for pick-up. Patient requested for letter to be mailed to home. Verified address. Letter sent via mail.

## 2023-10-28 ENCOUNTER — Ambulatory Visit: Payer: Medicaid Other | Attending: Cardiology | Admitting: Cardiology

## 2023-10-28 ENCOUNTER — Encounter: Payer: Self-pay | Admitting: Cardiology

## 2023-10-28 VITALS — BP 98/74 | HR 116 | Ht 62.5 in | Wt 246.4 lb

## 2023-10-28 DIAGNOSIS — I509 Heart failure, unspecified: Secondary | ICD-10-CM | POA: Diagnosis not present

## 2023-10-28 DIAGNOSIS — I5022 Chronic systolic (congestive) heart failure: Secondary | ICD-10-CM | POA: Diagnosis not present

## 2023-10-28 DIAGNOSIS — I472 Ventricular tachycardia, unspecified: Secondary | ICD-10-CM

## 2023-10-28 DIAGNOSIS — Z7689 Persons encountering health services in other specified circumstances: Secondary | ICD-10-CM | POA: Diagnosis not present

## 2023-10-28 DIAGNOSIS — E118 Type 2 diabetes mellitus with unspecified complications: Secondary | ICD-10-CM

## 2023-10-28 NOTE — Patient Instructions (Signed)
Medication Instructions:  No changes today. *If you need a refill on your cardiac medications before your next appointment, please call your pharmacy*   Follow-Up: At [facility], you and your health needs are our priority.  As part of our continuing mission to provide you with exceptional heart care, we have created designated Provider Care Teams.  These Care Teams include your primary Cardiologist (physician) and Advanced Practice Providers (APPs -  Physician Assistants and Nurse Practitioners) who all work together to provide you with the care you need, when you need it.  We recommend signing up for the patient portal called "MyChart".  Sign up information is provided on this After Visit Summary.  MyChart is used to connect with patients for Virtual Visits (Telemedicine).  Patients are able to view lab/test results, encounter notes, upcoming appointments, etc.  Non-urgent messages can be sent to your provider as well.   To learn more about what you can do with MyChart, go to [email or web address].    Your next appointment:   6 month(s)  Provider:   Rollene Rotunda, MD

## 2023-10-29 DIAGNOSIS — I509 Heart failure, unspecified: Secondary | ICD-10-CM | POA: Diagnosis not present

## 2023-10-29 NOTE — Progress Notes (Signed)
ADVANCED HEART FAILURE CLINIC NOTE  Referring Physician: Marcine Matar, MD  Primary Care: Marcine Matar, MD Primary Cardiologist: Dr. Antoine Poche  HPI: Stephanie Reilly is a 53 y.o. female with hypertension, hyperlipidemia, type 2 diabetes, obesity and systolic heart failure presenting today for follow up.  Her history dates back to 02/15/23 when she was admitted to [facility] 2/2 stroke from occlusion of the proximal right M2.  She was sent to interventional radiology and intubated.  Immediately postintubation she had PEA arrest ultimately undergoing diagnostic cerebral angiogram and thrombectomy for the right M2 occlusion now status post complete recanalization.   Her cardiac history dates back to 02/07/2023 when she presented to the emergency department with chest pain and shortness of breath that started in March 2024.  During that admission patient had an echocardiogram that demonstrated EF of 20% with global hypokinesis but preserved RV function.  She underwent left heart cath which was nonsignificant and right heart cath that demonstrated preserved cardiac index.  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  She was discharged home on Entresto, Farxiga, and spironolactone and Toprol.  Interval hx:  - Since her last appointment she has been doing fairly well from a functional standpoint. Reports that she is now going to the gym 4-5x weekly with no functional limitations 2/2 SOB, CP, lightheadedness.  - No longer having issues with LE edema.   Activity level/exercise tolerance:  NYHA IIB Orthopnea:  Sleeps on 2 pillows Paroxysmal noctural dyspnea:  No Chest pain/pressure:  No Orthostatic lightheadedness:  No Palpitations:  No Lower extremity edema:  No Presyncope/syncope:  NO Cough:  No  Past Medical History:  Diagnosis Date   Anemia    Anxiety    Arthritis    Depression    Hypertension    Prediabetes     Current Outpatient  Medications  Medication Sig Dispense Refill   Accu-Chek Softclix Lancets lancets Check blood sugars 7 times daily. 100 each 12   apixaban (ELIQUIS) 5 MG TABS tablet Take 1 tablet (5 mg total) by mouth 2 (two) times daily. 180 tablet 3   Ascorbic Acid (VITAMIN C) 100 MG tablet Take 100 mg by mouth daily.     Biotin (BIOTIN MAXIMUM STRENGTH) 10 MG TABS Take 10 mg by mouth daily.     Blood Glucose Monitoring Suppl (ACCU-CHEK GUIDE) w/Device KIT Check blood sugars 7 times daily. 1 kit 0   cetirizine (ZYRTEC) 10 MG tablet Take 10 mg by mouth daily.     dapagliflozin propanediol (FARXIGA) 10 MG TABS tablet Take 1 tablet (10 mg total) by mouth daily. 90 tablet 3   fluticasone (FLONASE) 50 MCG/ACT nasal spray Place 1 spray into both nostrils daily.     glucose blood (ACCU-CHEK GUIDE TEST) test strip Check blood sugars 7 times daily. 100 each 12   polyethylene glycol powder (GLYCOLAX/MIRALAX) 17 GM/SCOOP powder MIX 17 GRAMS WITH LIQUID AND DRINK EVERY DAY AS NEEDED 510 g 3   rosuvastatin (CRESTOR) 20 MG tablet Take 1 tablet (20 mg total) by mouth daily. 90 tablet 3   sacubitril-valsartan (ENTRESTO) 97-103 MG Take 1 tablet by mouth 2 (two) times daily. 60 tablet 3   Semaglutide-Weight Management (WEGOVY) 1 MG/0.5ML SOAJ Inject 1 mg into the skin once a week. 2 mL 2   senna-docusate (SENOKOT-S) 8.6-50 MG tablet Take 1 tablet by mouth at bedtime as needed for mild constipation. 30 tablet 0   simethicone (MYLICON) 80 MG  chewable tablet Chew 80 mg by mouth daily.     spironolactone (ALDACTONE) 50 MG tablet Take 1 tablet (50 mg total) by mouth daily. 30 tablet 11   torsemide (DEMADEX) 20 MG tablet Take 1 tablet (20 mg total) by mouth daily as needed (for increased edema). 30 tablet 0   carvedilol (COREG) 25 MG tablet Take 1 tablet (25 mg total) by mouth 2 (two) times daily with a meal. 60 tablet 3   No current facility-administered medications for this encounter.    Allergies  Allergen Reactions    Paroxetine Hcl Anaphylaxis   Prednisone Shortness Of Breath and Other (See Comments)    Confusion, "messes with mental," muscles feel weird, fatigue, insomnia   Chocolate Other (See Comments)    Unknown    Ibuprofen Other (See Comments)    Unknown    Iron Other (See Comments)    GI upset (reported 08/04/2017). Pt clarifies had constipation, nausea.  Stool softeners did not help.    Latex Other (See Comments)    Unknown    Lisinopril Hives   Naproxen Other (See Comments)    Unknown    Tramadol Hives   Codeine Rash   Penicillins Rash   Serotonin Nausea Only, Swelling, Anxiety, Rash and Other (See Comments)    GI intolerence      Social History   Socioeconomic History   Marital status: Single    Spouse name: Not on file   Number of children: 3   Years of education: Not on file   Highest education level: Associate degree: occupational, Scientist, product/process development, or vocational program  Occupational History   Occupation: self employed  Tobacco Use   Smoking status: Former    Current packs/day: 0.00    Average packs/day: 0.5 packs/day for 11.0 years (5.5 ttl pk-yrs)    Types: Cigarettes    Start date: 10/2010    Quit date: 10/2021    Years since quitting: 2.0    Passive exposure: Past   Smokeless tobacco: Never  Vaping Use   Vaping status: Never Used  Substance and Sexual Activity   Alcohol use: Never   Drug use: Yes    Types: Marijuana    Comment: routine use of CBD edibles   Sexual activity: Not on file  Other Topics Concern   Not on file  Social History Narrative   Not on file   Social Drivers of Health   Financial Resource Strain: High Risk (09/22/2023)   Overall Financial Resource Strain (CARDIA)    Difficulty of Paying Living Expenses: Very hard  Food Insecurity: Food Insecurity Present (09/22/2023)   Hunger Vital Sign    Worried About Running Out of Food in the Last Year: Often true    Ran Out of Food in the Last Year: Often true  Transportation Needs: No  Transportation Needs (09/22/2023)   PRAPARE - Administrator, Civil Service (Medical): No    Lack of Transportation (Non-Medical): No  Recent Concern: Transportation Needs - Unmet Transportation Needs (07/21/2023)   PRAPARE - Transportation    Lack of Transportation (Medical): Yes    Lack of Transportation (Non-Medical): Yes  Physical Activity: Sufficiently Active (09/22/2023)   Exercise Vital Sign    Days of Exercise per Week: 7 days    Minutes of Exercise per Session: 30 min  Recent Concern: Physical Activity - Insufficiently Active (07/25/2023)   Exercise Vital Sign    Days of Exercise per Week: 3 days    Minutes of Exercise per  Session: 20 min  Stress: Stress Concern Present (09/22/2023)   [employer] of Occupational Health - Occupational Stress Questionnaire    Feeling of Stress : Very much  Social Connections: Socially Isolated (09/22/2023)   Social Connection and Isolation Panel [NHANES]    Frequency of Communication with Friends and Family: Once a week    Frequency of Social Gatherings with Friends and Family: Once a week    Attends Religious Services: 1 to 4 times per year    Active Member of [employer] or Organizations: No    Attends Banker Meetings: Never    Marital Status: Never married  Intimate Partner Violence: Not At Risk (07/25/2023)   Humiliation, Afraid, Rape, and Kick questionnaire    Fear of Current or Ex-Partner: No    Emotionally Abused: No    Physically Abused: No    Sexually Abused: No      Family History  Problem Relation Age of Onset   Hypertension Mother    Heart failure Mother    COPD Mother    Diabetes Paternal Lavell Anchors Weights   10/30/23 1026  Weight: 111.9 kg (246 lb 9.6 oz)     PHYSICAL EXAM: Vitals:   10/30/23 1026  BP: 110/70  Pulse: 87  SpO2: 98%   GENERAL: Well nourished, well developed, and in no apparent distress at rest.  HEENT: There is no scleral icterus.  The mucous membranes are  pink and moist.   CHEST: There are no chest wall deformities. There is no chest wall tenderness. Respirations are unlabored.  Lungs- CTA B/L CARDIAC:  JVP: 7 cm          Normal rate with regular rhythm. No murmur.  Pulses are 2+ and symmetrical in upper and lower extremities. No edema.  ABDOMEN: Soft, non-tender, non-distended. There are normal bowel sounds.  EXTREMITIES: Warm and well perfused.  NEUROLOGIC: Patient is oriented x3 with no obvious focal neurologic deficits.  PSYCH: Patients affect is appropriate SKIN: Warm and dry; no lesions or wounds.     DATA REVIEW  ECG: 03/14/23: sinus tachycardia  As per my personal interpretation  ECHO: 02/16/23: LVEF<20%, RV mildly reduced.  06/04/23: LVEF 35%.  10/29/22: LVEF 30%-35%  CATH: 02/10/23 Normal coronary anatomy Mild LV filling pressures. PCWP 23/21 with mean 20 mm Hg. LVEDP 21 mm Hg Mild pulmonary HTN PAP 42/22 with mean 30 mm Hg Cardiac output 4.81 L/min with index 2.26.  CMR: 02/11/23 1. Severe LV dilatation, normal wall thickness, and severe systolic dysfunction (EF 12%)  2.  Normal RV size with moderate systolic dysfunction (EF 31%)  3. Subendocardial LGE in focal region of mid inferior wall. Subendocardial LGE is typically ischemic in etiology and this could represent a small infarct. However, sarcoidosis can also cause subendocardial LGE. Recommend cardiac FDG PET to evaluate for sarcoid.  4. RV insertion site LGE, which is a nonspecific scar pattern often seen in setting of elevated pulmonary pressures   ASSESSMENT & PLAN:  Heart Failure with reduced EF Etiology of ZO:[id number] cardiomyopathy; cardiac PET ordered & pending.  NYHA class / AHA Stage:NYHA IIB Volume status & Diuretics: mildly hypervolemic; torsemide 20mg  PRN only. Take additional 20mg  today.  Vasodilators:Entresto 97/103mg  BID Beta-Blocker: increase coreg to 25mg  BID JXB:[id number] 25mg  daily Cardiometabolic:Farxiga 10mg  Devices therapies &  Valvulopathies: Her LVEF remains reduced at 30-35% however she is doing very well functionally. She is very averse to medication changes and procedures. We will try to push GDMT with plan for  repeat TTE in 2-21m. If LVEF remains low then plan to proceed with primary prevention ICD.  Advanced therapies:not currently indicated.   2. Right M2 stroke - s/p mechanical thrombectomy with small SAH - Suspect cardio embolic  - On Eliquis + statin.  - No residual deficits.    3. DMII - Hgb A1C 6.1  - On Farxiga  - She is now on [facility]   4. NSVT /PVCs  - EP saw during recent admit, PVC burden not high enough to warrant amiodarone. - Repeat labs today - No ectopy on exam today.   5. Obesity  - Body mass index is 44.38 kg/m. - discussed importance of continued exercise / weigh tloss.   I spent 35 minutes caring for this patient today including face to face time, ordering and reviewing labs, reviewing records from Dr. Antoine Poche, counseling on exercise/weight loss, seeing the patient, documenting in the record, and arranging follow ups.    Vanilla Heatherington Advanced Heart Failure Mechanical Circulatory Support

## 2023-10-30 ENCOUNTER — Ambulatory Visit (HOSPITAL_BASED_OUTPATIENT_CLINIC_OR_DEPARTMENT_OTHER)
Admission: RE | Admit: 2023-10-30 | Discharge: 2023-10-30 | Disposition: A | Discharge status: Home / Self Care | Payer: Medicaid Other | Source: Ambulatory Visit | Attending: *Deleted | Admitting: *Deleted

## 2023-10-30 ENCOUNTER — Ambulatory Visit (HOSPITAL_COMMUNITY)
Admission: RE | Admit: 2023-10-30 | Discharge: 2023-10-30 | Disposition: A | Discharge status: Home / Self Care | Payer: Medicaid Other | Source: Ambulatory Visit | Attending: Cardiology | Admitting: Cardiology

## 2023-10-30 ENCOUNTER — Encounter (HOSPITAL_COMMUNITY): Payer: Self-pay | Admitting: Cardiology

## 2023-10-30 VITALS — BP 110/70 | HR 87 | Wt 246.6 lb

## 2023-10-30 DIAGNOSIS — Z8673 Personal history of transient ischemic attack (TIA), and cerebral infarction without residual deficits: Secondary | ICD-10-CM | POA: Diagnosis not present

## 2023-10-30 DIAGNOSIS — E118 Type 2 diabetes mellitus with unspecified complications: Secondary | ICD-10-CM | POA: Diagnosis not present

## 2023-10-30 DIAGNOSIS — I110 Hypertensive heart disease with heart failure: Secondary | ICD-10-CM | POA: Diagnosis not present

## 2023-10-30 DIAGNOSIS — I5022 Chronic systolic (congestive) heart failure: Secondary | ICD-10-CM | POA: Diagnosis not present

## 2023-10-30 DIAGNOSIS — I63411 Cerebral infarction due to embolism of right middle cerebral artery: Secondary | ICD-10-CM

## 2023-10-30 DIAGNOSIS — I5021 Acute systolic (congestive) heart failure: Secondary | ICD-10-CM | POA: Diagnosis not present

## 2023-10-30 DIAGNOSIS — Q2112 Patent foramen ovale: Secondary | ICD-10-CM | POA: Insufficient documentation

## 2023-10-30 DIAGNOSIS — I472 Ventricular tachycardia, unspecified: Secondary | ICD-10-CM | POA: Diagnosis not present

## 2023-10-30 DIAGNOSIS — Z006 Encounter for examination for normal comparison and control in clinical research program: Secondary | ICD-10-CM

## 2023-10-30 DIAGNOSIS — R152 Fecal urgency: Secondary | ICD-10-CM | POA: Diagnosis not present

## 2023-10-30 DIAGNOSIS — N3946 Mixed incontinence: Secondary | ICD-10-CM | POA: Diagnosis not present

## 2023-10-30 DIAGNOSIS — I509 Heart failure, unspecified: Secondary | ICD-10-CM | POA: Diagnosis not present

## 2023-10-30 DIAGNOSIS — R159 Full incontinence of feces: Secondary | ICD-10-CM | POA: Diagnosis not present

## 2023-10-30 DIAGNOSIS — Z7689 Persons encountering health services in other specified circumstances: Secondary | ICD-10-CM | POA: Diagnosis not present

## 2023-10-30 LAB — BASIC METABOLIC PANEL
Anion gap: 10 (ref 5–15)
BUN: 15 mg/dL (ref 6–20)
CO2: 24 mmol/L (ref 22–32)
Calcium: 10.4 mg/dL — ABNORMAL HIGH (ref 8.9–10.3)
Chloride: 102 mmol/L (ref 98–111)
Creatinine, Ser: 0.98 mg/dL (ref 0.44–1.00)
GFR, Estimated: >60 mL/min (ref >60)
Glucose, Bld: 97 mg/dL (ref 70–99)
Potassium: 4.4 mmol/L (ref 3.5–5.1)
Sodium: 136 mmol/L (ref 135–145)

## 2023-10-30 LAB — BRAIN NATRIURETIC PEPTIDE: B Natriuretic Peptide: 11.1 pg/mL (ref 0.0–100.0)

## 2023-10-30 LAB — ECHOCARDIOGRAM COMPLETE
Area-P 1/2: 8.25 cm2
Calc EF: 32.2 %
S' Lateral: 3.7 cm
Single Plane A2C EF: 34.9 %
Single Plane A4C EF: 29 %

## 2023-10-30 MED ORDER — CARVEDILOL 25 MG PO TABS: 25.0000 mg | ORAL_TABLET | Freq: Two times a day (BID) | ORAL | 3 refills | Status: DC

## 2023-10-30 NOTE — Patient Instructions (Signed)
Medication Changes:  START: CARVEDILOL 25MG  TWICE DAILY   Lab Work:  Labs done today, your results will be available in MyChart, we will contact you for abnormal readings.  Follow-Up in: 3 MONTHS WITH ECHO PLEASE CALL OUR OFFICE AROUND FEB/MARCH TO GET SCHEDULED FOR YOUR APPOINTMENT. PHONE NUMBER IS [phone] OPTION 2   At the [facility], you and your health needs are our priority. We have a designated team specialized in the treatment of Heart Failure. This Care Team includes your primary Heart Failure Specialized Cardiologist (physician), Advanced Practice Providers (APPs- Physician Assistants and Nurse Practitioners), and Pharmacist who all work together to provide you with the care you need, when you need it.   You may see any of the following providers on your designated Care Team at your next follow up:  Dr. Arvilla Meres Dr. Marca Ancona Dr. Dorthula Nettles Dr. Theresia Bough Tonye Becket, NP Robbie Lis, [place] [facility] [place], [place] Brynda Peon, NP [place] Lee, NP Karle Plumber, PharmD   Please be sure to bring in all your medications bottles to every appointment.   Need to Contact us:  If you have any questions or concerns before your next appointment please send [place] a message through [place] or call our office at [phone].    TO LEAVE A MESSAGE FOR THE NURSE SELECT OPTION 2, PLEASE LEAVE A MESSAGE INCLUDING: YOUR NAME DATE OF BIRTH CALL BACK NUMBER REASON FOR CALL**this is important as we prioritize the call backs  YOU WILL RECEIVE A CALL BACK THE SAME DAY AS LONG AS YOU CALL BEFORE 4:00 PM

## 2023-10-30 NOTE — Progress Notes (Signed)
  Echocardiogram 2D Echocardiogram has been performed.  Stephanie Reilly 10/30/2023, 9:45 AM

## 2023-10-30 NOTE — Research (Addendum)
SITE: 050     Subject # [id number]    Subprotocol: A  Inclusion Criteria  Patients who meet all of the following criteria are eligible for enrollment as study participants:  Yes No  Age > 55 years old X   Eligible to wear Holter Study X    Exclusion Criteria  Patients who meet any of these criteria are not eligible for enrollment as study participants: Yes No  1. Receiving any mechanical (respiratory or circulatory) or renal support therapy at Screening or during Visit #1.  X  2.  Any other conditions that in the opinion of the investigators are likely to prevent compliance with the study protocol or pose a safety concern if the subject participates in the study.  X  3. Poor tolerance, namely susceptible to severe skin allergies from ECG adhesive patch application.  X   Protocol: REV H                                     Residential Zip code [address] (First 3 digits ONLY)                                             [facility] Informed Consent   Subject Name: Stephanie Reilly  Subject met inclusion and exclusion criteria.  The informed consent form, study requirements and expectations were reviewed with the subject. Subject had opportunity to read consent and questions and concerns were addressed prior to the signing of the consent form.  The subject verbalized understanding of the trial requirements.  The subject agreed to participate in the [facility] EF ACT trial and signed the informed consent at 09:52 on 29-Oct-2022.  The informed consent was obtained prior to performance of any protocol-specific procedures for the subject.  A copy of the signed informed consent was given to the subject and a copy was placed in the subject's medical record.   Dyanne Iha          Current Outpatient Medications:    Accu-Chek Softclix Lancets lancets, Check blood sugars 7 times daily., Disp: 100 each, Rfl: 12   apixaban (ELIQUIS) 5 MG TABS tablet, Take 1 tablet (5 mg total) by mouth 2 (two) times daily.,  Disp: 180 tablet, Rfl: 3   Ascorbic Acid (VITAMIN C) 100 MG tablet, Take 100 mg by mouth daily., Disp: , Rfl:    Biotin (BIOTIN MAXIMUM STRENGTH) 10 MG TABS, Take 10 mg by mouth daily., Disp: , Rfl:    Blood Glucose Monitoring Suppl (ACCU-CHEK GUIDE) w/Device KIT, Check blood sugars 7 times daily., Disp: 1 kit, Rfl: 0   carvedilol (COREG) 12.5 MG tablet, Take 1 tablet (12.5 mg total) by mouth 2 (two) times daily with a meal., Disp: 180 tablet, Rfl: 3   cetirizine (ZYRTEC) 10 MG tablet, Take 10 mg by mouth daily., Disp: , Rfl:    dapagliflozin propanediol (FARXIGA) 10 MG TABS tablet, Take 1 tablet (10 mg total) by mouth daily., Disp: 90 tablet, Rfl: 3   fluticasone (FLONASE) 50 MCG/ACT nasal spray, Place 1 spray into both nostrils daily., Disp: , Rfl:    glucose blood (ACCU-CHEK GUIDE TEST) test strip, Check blood sugars 7 times daily., Disp: 100 each, Rfl: 12   polyethylene glycol powder (GLYCOLAX/MIRALAX) 17 GM/SCOOP powder, MIX 17 GRAMS WITH LIQUID AND  DRINK EVERY DAY AS NEEDED, Disp: 510 g, Rfl: 3   rosuvastatin (CRESTOR) 20 MG tablet, Take 1 tablet (20 mg total) by mouth daily., Disp: 90 tablet, Rfl: 3   sacubitril-valsartan (ENTRESTO) 97-103 MG, Take 1 tablet by mouth 2 (two) times daily., Disp: 60 tablet, Rfl: 3   Semaglutide-Weight Management (WEGOVY) 1 MG/0.5ML SOAJ, Inject 1 mg into the skin once a week., Disp: 2 mL, Rfl: 2   senna-docusate (SENOKOT-S) 8.6-50 MG tablet, Take 1 tablet by mouth at bedtime as needed for mild constipation., Disp: 30 tablet, Rfl: 0   simethicone (MYLICON) 80 MG chewable tablet, Chew 80 mg by mouth daily., Disp: , Rfl:    spironolactone (ALDACTONE) 50 MG tablet, Take 1 tablet (50 mg total) by mouth daily., Disp: 30 tablet, Rfl: 11   torsemide (DEMADEX) 20 MG tablet, Take 1 tablet (20 mg total) by mouth daily as needed (for increased edema)., Disp: 30 tablet, Rfl: 0

## 2023-10-31 DIAGNOSIS — Z7689 Persons encountering health services in other specified circumstances: Secondary | ICD-10-CM | POA: Diagnosis not present

## 2023-10-31 DIAGNOSIS — I509 Heart failure, unspecified: Secondary | ICD-10-CM | POA: Diagnosis not present

## 2023-11-01 DIAGNOSIS — I509 Heart failure, unspecified: Secondary | ICD-10-CM | POA: Diagnosis not present

## 2023-11-02 DIAGNOSIS — I509 Heart failure, unspecified: Secondary | ICD-10-CM | POA: Diagnosis not present

## 2023-11-03 ENCOUNTER — Telehealth: Payer: Self-pay

## 2023-11-03 DIAGNOSIS — I509 Heart failure, unspecified: Secondary | ICD-10-CM | POA: Diagnosis not present

## 2023-11-03 DIAGNOSIS — Z7689 Persons encountering health services in other specified circumstances: Secondary | ICD-10-CM | POA: Diagnosis not present

## 2023-11-03 NOTE — Telephone Encounter (Signed)
Let patient know that the prescription for Wegovy 1 mg was sent to her pharmacy earlier this month.  With 2 additional refills.

## 2023-11-03 NOTE — Telephone Encounter (Signed)
Copied from CRM [id number]. Topic: Clinical - Prescription Issue >> Nov 03, 2023  8:57 AM Stephanie Reilly wrote: Reason for CRM: Patient is calling to report that she is on her last pen of the [facility] - she would like to request an increase to 1 MG/0.75 ML . She is requesting the new prescription be called into the [facility] on [address]   Pharmacy: [id number] - [place], [place] - [address] AT [facility] [address] [facility] [address]  Phone: [phone] Fax: [phone]

## 2023-11-04 DIAGNOSIS — Z7689 Persons encountering health services in other specified circumstances: Secondary | ICD-10-CM | POA: Diagnosis not present

## 2023-11-04 DIAGNOSIS — I509 Heart failure, unspecified: Secondary | ICD-10-CM | POA: Diagnosis not present

## 2023-11-04 NOTE — Telephone Encounter (Signed)
Pt states that she would like to go up to 1.7 of her [facility] medication.

## 2023-11-05 DIAGNOSIS — I509 Heart failure, unspecified: Secondary | ICD-10-CM | POA: Diagnosis not present

## 2023-11-05 NOTE — Telephone Encounter (Signed)
Has she completed 4 wks of the 1 mg Wegovy?  If she has not, she needs to complete before we can increase the dose to 1.7.

## 2023-11-05 NOTE — Telephone Encounter (Signed)
Yes she states that she is one her last pen of the 1mg 

## 2023-11-06 DIAGNOSIS — I509 Heart failure, unspecified: Secondary | ICD-10-CM | POA: Diagnosis not present

## 2023-11-06 MED ORDER — WEGOVY 1.7 MG/0.75ML ~~LOC~~ SOAJ: 1.7000 mg | SUBCUTANEOUS | 1 refills | Status: DC

## 2023-11-06 NOTE — Addendum Note (Signed)
Addended by: Jonah Blue B on: 11/06/2023 07:08 AM   Modules accepted: Orders

## 2023-11-07 DIAGNOSIS — I509 Heart failure, unspecified: Secondary | ICD-10-CM | POA: Diagnosis not present

## 2023-11-08 DIAGNOSIS — Z419 Encounter for procedure for purposes other than remedying health state, unspecified: Secondary | ICD-10-CM | POA: Diagnosis not present

## 2023-11-08 DIAGNOSIS — I509 Heart failure, unspecified: Secondary | ICD-10-CM | POA: Diagnosis not present

## 2023-11-09 DIAGNOSIS — I509 Heart failure, unspecified: Secondary | ICD-10-CM | POA: Diagnosis not present

## 2023-11-10 DIAGNOSIS — I509 Heart failure, unspecified: Secondary | ICD-10-CM | POA: Diagnosis not present

## 2023-11-11 DIAGNOSIS — I509 Heart failure, unspecified: Secondary | ICD-10-CM | POA: Diagnosis not present

## 2023-11-12 DIAGNOSIS — I509 Heart failure, unspecified: Secondary | ICD-10-CM | POA: Diagnosis not present

## 2023-11-12 DIAGNOSIS — Z7689 Persons encountering health services in other specified circumstances: Secondary | ICD-10-CM | POA: Diagnosis not present

## 2023-11-13 DIAGNOSIS — I509 Heart failure, unspecified: Secondary | ICD-10-CM | POA: Diagnosis not present

## 2023-11-14 DIAGNOSIS — Z7689 Persons encountering health services in other specified circumstances: Secondary | ICD-10-CM | POA: Diagnosis not present

## 2023-11-14 DIAGNOSIS — I509 Heart failure, unspecified: Secondary | ICD-10-CM | POA: Diagnosis not present

## 2023-11-15 DIAGNOSIS — I509 Heart failure, unspecified: Secondary | ICD-10-CM | POA: Diagnosis not present

## 2023-11-16 DIAGNOSIS — I509 Heart failure, unspecified: Secondary | ICD-10-CM | POA: Diagnosis not present

## 2023-11-17 DIAGNOSIS — I509 Heart failure, unspecified: Secondary | ICD-10-CM | POA: Diagnosis not present

## 2023-11-18 DIAGNOSIS — I509 Heart failure, unspecified: Secondary | ICD-10-CM | POA: Diagnosis not present

## 2023-11-19 ENCOUNTER — Other Ambulatory Visit (HOSPITAL_COMMUNITY): Payer: Self-pay | Admitting: Cardiology

## 2023-11-19 DIAGNOSIS — I509 Heart failure, unspecified: Secondary | ICD-10-CM | POA: Diagnosis not present

## 2023-11-20 DIAGNOSIS — I509 Heart failure, unspecified: Secondary | ICD-10-CM | POA: Diagnosis not present

## 2023-11-21 DIAGNOSIS — I509 Heart failure, unspecified: Secondary | ICD-10-CM | POA: Diagnosis not present

## 2023-11-22 DIAGNOSIS — I509 Heart failure, unspecified: Secondary | ICD-10-CM | POA: Diagnosis not present

## 2023-11-23 DIAGNOSIS — I509 Heart failure, unspecified: Secondary | ICD-10-CM | POA: Diagnosis not present

## 2023-11-24 DIAGNOSIS — I509 Heart failure, unspecified: Secondary | ICD-10-CM | POA: Diagnosis not present

## 2023-11-25 DIAGNOSIS — I509 Heart failure, unspecified: Secondary | ICD-10-CM | POA: Diagnosis not present

## 2023-11-26 DIAGNOSIS — I509 Heart failure, unspecified: Secondary | ICD-10-CM | POA: Diagnosis not present

## 2023-11-27 DIAGNOSIS — I509 Heart failure, unspecified: Secondary | ICD-10-CM | POA: Diagnosis not present

## 2023-11-28 DIAGNOSIS — I509 Heart failure, unspecified: Secondary | ICD-10-CM | POA: Diagnosis not present

## 2023-11-29 DIAGNOSIS — I509 Heart failure, unspecified: Secondary | ICD-10-CM | POA: Diagnosis not present

## 2023-11-30 DIAGNOSIS — I509 Heart failure, unspecified: Secondary | ICD-10-CM | POA: Diagnosis not present

## 2023-12-01 DIAGNOSIS — N3946 Mixed incontinence: Secondary | ICD-10-CM | POA: Diagnosis not present

## 2023-12-01 DIAGNOSIS — R152 Fecal urgency: Secondary | ICD-10-CM | POA: Diagnosis not present

## 2023-12-01 DIAGNOSIS — R159 Full incontinence of feces: Secondary | ICD-10-CM | POA: Diagnosis not present

## 2023-12-01 DIAGNOSIS — I5021 Acute systolic (congestive) heart failure: Secondary | ICD-10-CM | POA: Diagnosis not present

## 2023-12-02 DIAGNOSIS — I509 Heart failure, unspecified: Secondary | ICD-10-CM | POA: Diagnosis not present

## 2023-12-03 DIAGNOSIS — I509 Heart failure, unspecified: Secondary | ICD-10-CM | POA: Diagnosis not present

## 2023-12-04 ENCOUNTER — Ambulatory Visit (HOSPITAL_COMMUNITY): Payer: Medicaid Other | Admitting: Clinical

## 2023-12-04 ENCOUNTER — Encounter (HOSPITAL_COMMUNITY): Payer: Self-pay

## 2023-12-04 ENCOUNTER — Telehealth: Payer: Self-pay

## 2023-12-04 DIAGNOSIS — I509 Heart failure, unspecified: Secondary | ICD-10-CM | POA: Diagnosis not present

## 2023-12-04 MED ORDER — SEMAGLUTIDE-WEIGHT MANAGEMENT 2.4 MG/0.75ML ~~LOC~~ SOAJ: 2.4000 mg | SUBCUTANEOUS | 1 refills | Status: DC

## 2023-12-04 NOTE — Telephone Encounter (Signed)
 Prescription sent for the [facility] 2.4 mg.  Start the increased dose after she completes 4 weeks of the 1.7 mg pens. Keep appt with me next mth.

## 2023-12-04 NOTE — Telephone Encounter (Signed)
 Copied from CRM [id number]. Topic: Clinical - Prescription Issue >> Dec 02, 2023 11:54 AM Bo Mcclintock wrote: Reason for CRM: Pt states she would like to let Dr. Laural Benes know she has finished her 1.7mg  wegovy and has 1 pen left. She states she is happy with the medication and she is doing well and ready to upgrade to the 2.5mg 

## 2023-12-05 DIAGNOSIS — I509 Heart failure, unspecified: Secondary | ICD-10-CM | POA: Diagnosis not present

## 2023-12-05 NOTE — Telephone Encounter (Signed)
 Called & spoke to the patient. Verified name & DOB. Informed of Dr.Johnson's instructions " Prescription sent for the Wegovy 2.4 mg.  Start the increased dose after she completes 4 weeks of the 1.7 mg pens. Keep appt with me next mth." Patient agreed and confirmed appointment for next month. No further questions at this time.

## 2023-12-06 DIAGNOSIS — Z419 Encounter for procedure for purposes other than remedying health state, unspecified: Secondary | ICD-10-CM | POA: Diagnosis not present

## 2023-12-06 DIAGNOSIS — I509 Heart failure, unspecified: Secondary | ICD-10-CM | POA: Diagnosis not present

## 2023-12-07 DIAGNOSIS — I509 Heart failure, unspecified: Secondary | ICD-10-CM | POA: Diagnosis not present

## 2023-12-08 DIAGNOSIS — I509 Heart failure, unspecified: Secondary | ICD-10-CM | POA: Diagnosis not present

## 2023-12-09 DIAGNOSIS — I509 Heart failure, unspecified: Secondary | ICD-10-CM | POA: Diagnosis not present

## 2023-12-10 DIAGNOSIS — I509 Heart failure, unspecified: Secondary | ICD-10-CM | POA: Diagnosis not present

## 2023-12-11 DIAGNOSIS — I509 Heart failure, unspecified: Secondary | ICD-10-CM | POA: Diagnosis not present

## 2023-12-12 DIAGNOSIS — Z7689 Persons encountering health services in other specified circumstances: Secondary | ICD-10-CM | POA: Diagnosis not present

## 2023-12-12 DIAGNOSIS — I509 Heart failure, unspecified: Secondary | ICD-10-CM | POA: Diagnosis not present

## 2023-12-13 DIAGNOSIS — I509 Heart failure, unspecified: Secondary | ICD-10-CM | POA: Diagnosis not present

## 2023-12-14 DIAGNOSIS — I509 Heart failure, unspecified: Secondary | ICD-10-CM | POA: Diagnosis not present

## 2023-12-15 DIAGNOSIS — I509 Heart failure, unspecified: Secondary | ICD-10-CM | POA: Diagnosis not present

## 2023-12-16 DIAGNOSIS — I509 Heart failure, unspecified: Secondary | ICD-10-CM | POA: Diagnosis not present

## 2023-12-17 DIAGNOSIS — Z419 Encounter for procedure for purposes other than remedying health state, unspecified: Secondary | ICD-10-CM | POA: Diagnosis not present

## 2023-12-17 DIAGNOSIS — I509 Heart failure, unspecified: Secondary | ICD-10-CM | POA: Diagnosis not present

## 2023-12-18 DIAGNOSIS — I509 Heart failure, unspecified: Secondary | ICD-10-CM | POA: Diagnosis not present

## 2023-12-19 DIAGNOSIS — I509 Heart failure, unspecified: Secondary | ICD-10-CM | POA: Diagnosis not present

## 2023-12-19 NOTE — Addendum Note (Signed)
 Encounter addended by: Howell Rucks, RDCS on: 12/19/2023 3:08 PM  Actions taken: Imaging Exam ended

## 2023-12-20 DIAGNOSIS — I509 Heart failure, unspecified: Secondary | ICD-10-CM | POA: Diagnosis not present

## 2023-12-21 DIAGNOSIS — I509 Heart failure, unspecified: Secondary | ICD-10-CM | POA: Diagnosis not present

## 2023-12-22 DIAGNOSIS — I509 Heart failure, unspecified: Secondary | ICD-10-CM | POA: Diagnosis not present

## 2023-12-23 DIAGNOSIS — I509 Heart failure, unspecified: Secondary | ICD-10-CM | POA: Diagnosis not present

## 2023-12-24 ENCOUNTER — Encounter: Payer: Self-pay | Admitting: Internal Medicine

## 2023-12-24 ENCOUNTER — Ambulatory Visit: Payer: Medicaid Other | Attending: Internal Medicine | Admitting: Internal Medicine

## 2023-12-24 VITALS — BP 116/80 | HR 102 | Ht 62.0 in | Wt 245.0 lb

## 2023-12-24 DIAGNOSIS — F330 Major depressive disorder, recurrent, mild: Secondary | ICD-10-CM | POA: Diagnosis not present

## 2023-12-24 DIAGNOSIS — Z23 Encounter for immunization: Secondary | ICD-10-CM | POA: Diagnosis not present

## 2023-12-24 DIAGNOSIS — Z8673 Personal history of transient ischemic attack (TIA), and cerebral infarction without residual deficits: Secondary | ICD-10-CM

## 2023-12-24 DIAGNOSIS — R7303 Prediabetes: Secondary | ICD-10-CM

## 2023-12-24 DIAGNOSIS — Z6841 Body Mass Index (BMI) 40.0 and over, adult: Secondary | ICD-10-CM

## 2023-12-24 DIAGNOSIS — I5022 Chronic systolic (congestive) heart failure: Secondary | ICD-10-CM | POA: Diagnosis not present

## 2023-12-24 DIAGNOSIS — E66813 Obesity, class 3: Secondary | ICD-10-CM | POA: Diagnosis not present

## 2023-12-24 DIAGNOSIS — Z7689 Persons encountering health services in other specified circumstances: Secondary | ICD-10-CM | POA: Diagnosis not present

## 2023-12-24 DIAGNOSIS — I509 Heart failure, unspecified: Secondary | ICD-10-CM | POA: Diagnosis not present

## 2023-12-24 LAB — POCT GLYCOSYLATED HEMOGLOBIN (HGB A1C): HbA1c, POC (controlled diabetic range): 6.1 % (ref 0.0–7.0)

## 2023-12-24 LAB — GLUCOSE, POCT (MANUAL RESULT ENTRY): POC Glucose: 108 mg/dL — AB (ref 70–99)

## 2023-12-24 NOTE — Progress Notes (Signed)
 Patient ID: Stephanie Reilly, female    DOB: 05-26-1971  MRN: [id number]  CC: Diabetes (DM f/u./Already received flu vax)   Subjective: Stephanie Reilly is a 53 y.o. female who presents for chronic ds management. 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  History of Present Illness   The patient, with a history of prediabetes, congestive heart failure, and a history of stroke, presents with recent weight gain. She has been on Wegovy (with titration now up to max dose of 2.4 mg/wk) since November and had lost weight down to 225 lbs since she last saw me in December. However, due to a series of illnesses including a respiratory infection, stomach virus, and the flu, she has regained 20 lbs. During this time, she was unable to maintain her gym routine and healthy eating habits. She reports that the [facility] has been effective in curbing her appetite and she plans to return to her gym routine and healthy eating habits now that she has recovered from her illnesses.  Started going to the gym this week and plans to go every day.  She walks on the treadmill, does some light weightlifting and the machines that work core muscles. Results for orders placed or performed in visit on 12/24/23  POCT glycosylated hemoglobin (Hb A1C)   Collection Time: 12/24/23 11:32 AM  Result Value Ref Range   Hemoglobin A1C     HbA1c POC (<> result, manual entry)     HbA1c, POC (prediabetic range)     HbA1c, POC (controlled diabetic range) 6.1 0.0 - 7.0 %  POCT glucose (manual entry)   Collection Time: 12/24/23 11:33 AM  Result Value Ref Range   POC Glucose 108 (A) 70 - 99 mg/dl   CHFrEF:  In regards to her congestive heart failure, she has been managing her  symptoms with torsemide as needed, Entresto twice daily, carvedilol 25 mg twice daily, spironolactone 25 mg daily. She denies any recent chest pain or shortness of breath.   She also reports a new onset of neck pain on the right side, which she attributes to sinus pressure from allergies. She has been managing this with Flonase and saline nasal rinses.  Now doing better.  Hx CVA: For her history of stroke, which was thought to be embolic, she continues to take Eliquis and atorvastatin, denying any bleeding or bruising.   GAD/MDD: She is doing counseling with behavioral health and finds it helpful.  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 100 each 12   apixaban (ELIQUIS) 5 MG TABS tablet Take 1 tablet (5 mg total) by mouth 2 (two) times daily. 180 tablet 3   Ascorbic Acid (VITAMIN C) 100 MG tablet Take 100 mg by mouth daily.     Biotin (BIOTIN MAXIMUM STRENGTH) 10 MG TABS Take 10 mg by mouth daily.     Blood Glucose Monitoring Suppl (ACCU-CHEK GUIDE) w/Device KIT Check blood sugars 7 times daily. 1 kit  0   carvedilol (COREG) 25 MG tablet Take 1 tablet (25 mg total) by mouth 2 (two) times daily with a meal. 60 tablet 3   cetirizine (ZYRTEC) 10 MG tablet Take 10 mg by mouth daily.     dapagliflozin propanediol (FARXIGA) 10 MG TABS tablet Take 1 tablet (10 mg total) by mouth daily. 90 tablet 3   fluticasone (FLONASE) 50 MCG/ACT nasal spray Place 1 spray into both nostrils daily.     glucose blood (ACCU-CHEK GUIDE TEST) test strip Check blood sugars 7 times daily. 100 each 12   polyethylene glycol powder (GLYCOLAX/MIRALAX) 17 GM/SCOOP powder MIX 17 GRAMS WITH LIQUID AND DRINK EVERY DAY AS NEEDED 510 g 3   rosuvastatin (CRESTOR) 20 MG tablet Take 1 tablet (20 mg total) by mouth daily. 90 tablet 3   sacubitril-valsartan (ENTRESTO) 97-103 MG TAKE 1 TABLET BY MOUTH TWICE DAILY 60 tablet 4   Semaglutide-Weight Management 2.4 MG/0.75ML SOAJ Inject 2.4 mg into the skin once a week. 3 mL 1   senna-docusate (SENOKOT-S) 8.6-50 MG tablet Take 1 tablet by mouth at bedtime as needed for mild constipation. 30 tablet 0   simethicone (MYLICON) 80 MG chewable tablet Chew 80 mg by mouth daily.     spironolactone (ALDACTONE) 50 MG tablet Take 1 tablet (50 mg total) by mouth daily. 30 tablet 11   torsemide (DEMADEX) 20 MG tablet Take 1 tablet (20 mg total) by mouth daily as needed (for increased edema). 30 tablet 0   No current facility-administered medications on file prior to visit.    Allergies  Allergen Reactions   Paroxetine Hcl Anaphylaxis   Prednisone Shortness Of Breath and Other (See Comments)    Confusion, "messes with mental," muscles feel weird, fatigue, insomnia   Chocolate Other (See Comments)    Unknown    Ibuprofen Other (See Comments)    Unknown    Iron Other (See Comments)    GI upset (reported 08/04/2017). Pt clarifies had constipation, nausea.  Stool softeners did not help.    Latex Other (See Comments)    Unknown    Lisinopril Hives   Naproxen Other (See Comments)    Unknown     Tramadol Hives   Codeine Rash   Penicillins Rash   Serotonin Nausea Only, Swelling, Anxiety, Rash and Other (See Comments)    GI intolerence    Social History   Socioeconomic History   Marital status: Single    Spouse name: Not on file   Number of children: 3   Years of education: Not on file   Highest education level: Associate degree: occupational, Scientist, product/process development, or vocational program  Occupational History   Occupation: self employed  Tobacco Use   Smoking status: Former    Current packs/day: 0.00    Average packs/day: 0.5 packs/day for 11.0 years (5.5 ttl pk-yrs)    Types: Cigarettes    Start date: 10/2010  Quit date: 10/2021    Years since quitting: 2.2    Passive exposure: Past   Smokeless tobacco: Never  Vaping Use   Vaping status: Never Used  Substance and Sexual Activity   Alcohol use: Never   Drug use: Yes    Types: Marijuana    Comment: routine use of CBD edibles   Sexual activity: Not on file  Other Topics Concern   Not on file  Social History Narrative   Not on file   Social Drivers of Health   Financial Resource Strain: High Risk (09/22/2023)   Overall Financial Resource Strain (CARDIA)    Difficulty of Paying Living Expenses: Very hard  Food Insecurity: Food Insecurity Present (09/22/2023)   Hunger Vital Sign    Worried About Running Out of Food in the Last Year: Often true    Ran Out of Food in the Last Year: Often true  Transportation Needs: No Transportation Needs (09/22/2023)   PRAPARE - Administrator, Civil Service (Medical): No    Lack of Transportation (Non-Medical): No  Recent Concern: Transportation Needs - Unmet Transportation Needs (07/21/2023)   PRAPARE - Transportation    Lack of Transportation (Medical): Yes    Lack of Transportation (Non-Medical): Yes  Physical Activity: Sufficiently Active (09/22/2023)   Exercise Vital Sign    Days of Exercise per Week: 7 days    Minutes of Exercise per Session: 30 min  Recent  Concern: Physical Activity - Insufficiently Active (07/25/2023)   Exercise Vital Sign    Days of Exercise per Week: 3 days    Minutes of Exercise per Session: 20 min  Stress: Stress Concern Present (09/22/2023)   [employer] of Occupational Health - Occupational Stress Questionnaire    Feeling of Stress : Very much  Social Connections: Socially Isolated (09/22/2023)   Social Connection and Isolation Panel [NHANES]    Frequency of Communication with Friends and Family: Once a week    Frequency of Social Gatherings with Friends and Family: Once a week    Attends Religious Services: 1 to 4 times per year    Active Member of [employer] or Organizations: No    Attends Banker Meetings: Never    Marital Status: Never married  Intimate Partner Violence: Not At Risk (07/25/2023)   Humiliation, Afraid, Rape, and Kick questionnaire    Fear of Current or Ex-Partner: No    Emotionally Abused: No    Physically Abused: No    Sexually Abused: No    Family History  Problem Relation Age of Onset   Hypertension Mother    Heart failure Mother    COPD Mother    Diabetes Paternal Grandfather     Past Surgical History:  Procedure Laterality Date   ABDOMINAL HYSTERECTOMY     IR CT HEAD LTD  02/15/2023   IR PERCUTANEOUS ART THROMBECTOMY/INFUSION INTRACRANIAL INC DIAG ANGIO  02/15/2023   IR US GUIDE VASC ACCESS RIGHT  02/15/2023   RADIOLOGY WITH ANESTHESIA N/A 02/15/2023   Procedure: IR WITH ANESTHESIA;  Surgeon: Radiologist, Medication, MD;  Location: [facility] OR;  Service: Radiology;  Laterality: N/A;   RIGHT/LEFT HEART CATH AND CORONARY ANGIOGRAPHY N/A 02/10/2023   Procedure: RIGHT/LEFT HEART CATH AND CORONARY ANGIOGRAPHY;  Surgeon: [place], Peter M, MD;  Location: [facility] INVASIVE CV LAB;  Service: Cardiovascular;  Laterality: N/A;   TONSILLECTOMY     TUBAL LIGATION      ROS: Review of Systems Negative except as stated above  PHYSICAL EXAM: BP 116/80 (  BP Location: Left Arm, Patient Position:  Sitting, Cuff Size: Normal)   Pulse (!) 102   Ht 5\' 2"  (1.575 m)   Wt 245 lb (111.1 kg)   SpO2 98%   BMI 44.81 kg/m   Physical Exam  General appearance - alert, well appearing, morbidly obese middle-age African-American female and in no distress Mental status - normal mood, behavior, speech, dress, motor activity, and thought processes Ears - bilateral TM's and external ear canals normal Nose - normal and patent, no erythema, discharge or polyps Mouth - mucous membranes moist, pharynx normal without lesions Neck - supple, no significant adenopathy Chest - clear to auscultation, no wheezes, rales or rhonchi, symmetric air entry Heart - normal rate, regular rhythm, normal S1, S2, no murmurs, rubs, clicks or gallops Extremities - peripheral pulses normal, no pedal edema, no clubbing or cyanosis      Latest Ref Rng & Units 10/30/2023   11:04 AM 07/22/2023    3:30 PM 06/12/2023    8:59 AM  CMP  Glucose 70 - 99 mg/dL 97  409  811   BUN 6 - 20 mg/dL 15  14  15    Creatinine 0.44 - 1.00 mg/dL 9.14  7.82  9.56   Sodium 135 - 145 mmol/L 136  137  137   Potassium 3.5 - 5.1 mmol/L 4.4  4.6  3.6   Chloride 98 - 111 mmol/L 102  104  104   CO2 22 - 32 mmol/L 24  25  26    Calcium 8.9 - 10.3 mg/dL 21.3  08.6  9.9    Lipid Panel     Component Value Date/Time   CHOL 155 02/16/2023 0945   TRIG 62 02/16/2023 0945   HDL 45 02/16/2023 0945   CHOLHDL 3.4 02/16/2023 0945   VLDL 12 02/16/2023 0945   LDLCALC 98 02/16/2023 0945    CBC    Component Value Date/Time   WBC 10.4 02/28/2023 1028   RBC 5.14 (H) 02/28/2023 1028   HGB 13.7 02/28/2023 1028   HCT 43.5 02/28/2023 1028   PLT 333 02/28/2023 1028   MCV 84.6 02/28/2023 1028   MCH 26.7 02/28/2023 1028   MCHC 31.5 02/28/2023 1028   RDW 15.3 02/28/2023 1028   LYMPHSABS 3.3 02/15/2023 1245   MONOABS 0.9 02/15/2023 1245   EOSABS 0.1 02/15/2023 1245   BASOSABS 0.1 02/15/2023 1245    ASSESSMENT AND PLAN: 1. Obesity, Class III, BMI 40-49.9  (morbid obesity) (HCC) (Primary) Patient with weight gain of 20 pounds over the past month which she attributes to a series of acute illnesses which prevented her from going to the gym.  She has gotten back on track over the past week.  She is going to the gym daily.  She has gone back to eating smaller portion sizes.  She hopes that with these measures and being on the maximum dose of Wegovy 2.4 mg once a week, she will achieve weight loss again by next visit.  Will have her follow-up in 2 months.  2. Chronic systolic (congestive) heart failure (HCC) Stable and compensated.  Continue torsemide as needed, Entresto 97/103 mg twice a day, carvedilol 25 mg twice a day and spironolactone 25 mg daily  3. Prediabetes Patient states she has gotten back to healthy eating routine and smaller portion sizes. - POCT glycosylated hemoglobin (Hb A1C) - POCT glucose (manual entry)  4. History of CVA (cerebrovascular accident) without residual deficits Continue atorvastatin and Eliquis.  She reports no bleeding or bruising  on Eliquis.  5. Major depressive disorder, recurrent episode, mild (HCC) Reports she is doing better with therapy, journaling and prayers.  6. Need for vaccination against Streptococcus pneumoniae - Pneumococcal conjugate vaccine 20-valent    Patient was given the opportunity to ask questions.  Patient verbalized understanding of the plan and was able to repeat key elements of the plan.   This documentation was completed using Paediatric nurse.  Any transcriptional errors are unintentional.  Orders Placed This Encounter  Procedures   POCT glycosylated hemoglobin (Hb A1C)   POCT glucose (manual entry)     Requested Prescriptions    No prescriptions requested or ordered in this encounter    No follow-ups on file.  Jonah Blue, MD, FACP

## 2023-12-25 ENCOUNTER — Ambulatory Visit: Payer: Medicaid Other | Admitting: Internal Medicine

## 2023-12-25 DIAGNOSIS — I509 Heart failure, unspecified: Secondary | ICD-10-CM | POA: Diagnosis not present

## 2023-12-26 DIAGNOSIS — I509 Heart failure, unspecified: Secondary | ICD-10-CM | POA: Diagnosis not present

## 2023-12-27 DIAGNOSIS — I509 Heart failure, unspecified: Secondary | ICD-10-CM | POA: Diagnosis not present

## 2023-12-28 DIAGNOSIS — I509 Heart failure, unspecified: Secondary | ICD-10-CM | POA: Diagnosis not present

## 2023-12-29 ENCOUNTER — Other Ambulatory Visit (HOSPITAL_BASED_OUTPATIENT_CLINIC_OR_DEPARTMENT_OTHER): Payer: Self-pay | Admitting: Internal Medicine

## 2023-12-29 DIAGNOSIS — I509 Heart failure, unspecified: Secondary | ICD-10-CM | POA: Diagnosis not present

## 2023-12-29 DIAGNOSIS — Z139 Encounter for screening, unspecified: Secondary | ICD-10-CM

## 2023-12-30 DIAGNOSIS — I509 Heart failure, unspecified: Secondary | ICD-10-CM | POA: Diagnosis not present

## 2023-12-30 DIAGNOSIS — Z7689 Persons encountering health services in other specified circumstances: Secondary | ICD-10-CM | POA: Diagnosis not present

## 2023-12-31 DIAGNOSIS — I509 Heart failure, unspecified: Secondary | ICD-10-CM | POA: Diagnosis not present

## 2023-12-31 DIAGNOSIS — Z7689 Persons encountering health services in other specified circumstances: Secondary | ICD-10-CM | POA: Diagnosis not present

## 2024-01-01 ENCOUNTER — Telehealth: Payer: Self-pay

## 2024-01-01 DIAGNOSIS — Z7689 Persons encountering health services in other specified circumstances: Secondary | ICD-10-CM | POA: Diagnosis not present

## 2024-01-01 DIAGNOSIS — I509 Heart failure, unspecified: Secondary | ICD-10-CM | POA: Diagnosis not present

## 2024-01-01 NOTE — Telephone Encounter (Signed)
 Copied from CRM [id number]. Topic: General - Other >> Jan 01, 2024 10:31 AM Elmarie Shiley S wrote: Reason for CRM: Lauren from [facility] delivery sent fax on 3/20 for supplies for patient please follow up   [phone]

## 2024-01-02 DIAGNOSIS — I509 Heart failure, unspecified: Secondary | ICD-10-CM | POA: Diagnosis not present

## 2024-01-02 DIAGNOSIS — Z7689 Persons encountering health services in other specified circumstances: Secondary | ICD-10-CM | POA: Diagnosis not present

## 2024-01-02 NOTE — Telephone Encounter (Signed)
Form received and placed in provider box for completion.

## 2024-01-02 NOTE — Telephone Encounter (Signed)
 Fax not received. Called and spoke to a representative from [facility]. Informed that the fax sent on 12/25/23 was not received. Requested for the form to be re-faxed. Representative confirmed and will fax the form again.

## 2024-01-03 DIAGNOSIS — I509 Heart failure, unspecified: Secondary | ICD-10-CM | POA: Diagnosis not present

## 2024-01-04 DIAGNOSIS — I509 Heart failure, unspecified: Secondary | ICD-10-CM | POA: Diagnosis not present

## 2024-01-05 ENCOUNTER — Encounter (HOSPITAL_BASED_OUTPATIENT_CLINIC_OR_DEPARTMENT_OTHER): Payer: Self-pay

## 2024-01-05 ENCOUNTER — Inpatient Hospital Stay (HOSPITAL_BASED_OUTPATIENT_CLINIC_OR_DEPARTMENT_OTHER): Admission: RE | Admit: 2024-01-05 | Source: Ambulatory Visit

## 2024-01-05 ENCOUNTER — Ambulatory Visit (HOSPITAL_BASED_OUTPATIENT_CLINIC_OR_DEPARTMENT_OTHER)
Admission: RE | Admit: 2024-01-05 | Discharge: 2024-01-05 | Disposition: A | Discharge status: Home / Self Care | Source: Ambulatory Visit | Attending: Internal Medicine | Admitting: Internal Medicine

## 2024-01-05 DIAGNOSIS — R152 Fecal urgency: Secondary | ICD-10-CM | POA: Diagnosis not present

## 2024-01-05 DIAGNOSIS — R159 Full incontinence of feces: Secondary | ICD-10-CM | POA: Diagnosis not present

## 2024-01-05 DIAGNOSIS — I5021 Acute systolic (congestive) heart failure: Secondary | ICD-10-CM | POA: Diagnosis not present

## 2024-01-05 DIAGNOSIS — Z139 Encounter for screening, unspecified: Secondary | ICD-10-CM

## 2024-01-05 DIAGNOSIS — Z1231 Encounter for screening mammogram for malignant neoplasm of breast: Secondary | ICD-10-CM | POA: Insufficient documentation

## 2024-01-05 DIAGNOSIS — I509 Heart failure, unspecified: Secondary | ICD-10-CM | POA: Diagnosis not present

## 2024-01-05 DIAGNOSIS — N3946 Mixed incontinence: Secondary | ICD-10-CM | POA: Diagnosis not present

## 2024-01-05 DIAGNOSIS — Z7689 Persons encountering health services in other specified circumstances: Secondary | ICD-10-CM | POA: Diagnosis not present

## 2024-01-05 NOTE — Telephone Encounter (Signed)
 Form completed and successfully faxed back on 01/05/2024. Fax number: [phone].

## 2024-01-06 ENCOUNTER — Encounter: Payer: Self-pay | Admitting: Family Medicine

## 2024-01-06 DIAGNOSIS — I509 Heart failure, unspecified: Secondary | ICD-10-CM | POA: Diagnosis not present

## 2024-01-06 DIAGNOSIS — Z7689 Persons encountering health services in other specified circumstances: Secondary | ICD-10-CM | POA: Diagnosis not present

## 2024-01-07 DIAGNOSIS — I509 Heart failure, unspecified: Secondary | ICD-10-CM | POA: Diagnosis not present

## 2024-01-08 ENCOUNTER — Ambulatory Visit (INDEPENDENT_AMBULATORY_CARE_PROVIDER_SITE_OTHER): Payer: Medicaid Other | Admitting: Clinical

## 2024-01-08 DIAGNOSIS — F331 Major depressive disorder, recurrent, moderate: Secondary | ICD-10-CM | POA: Diagnosis not present

## 2024-01-08 DIAGNOSIS — I509 Heart failure, unspecified: Secondary | ICD-10-CM | POA: Diagnosis not present

## 2024-01-09 DIAGNOSIS — Z7689 Persons encountering health services in other specified circumstances: Secondary | ICD-10-CM | POA: Diagnosis not present

## 2024-01-09 DIAGNOSIS — I509 Heart failure, unspecified: Secondary | ICD-10-CM | POA: Diagnosis not present

## 2024-01-10 DIAGNOSIS — I509 Heart failure, unspecified: Secondary | ICD-10-CM | POA: Diagnosis not present

## 2024-01-10 NOTE — Progress Notes (Signed)
 THERAPIST PROGRESS NOTE Virtual Visit via Video Note  I connected with Stephanie Reilly on 01/08/2024 at  3:00 PM EDT by a video enabled telemedicine application and verified that I am speaking with the correct person using two identifiers.  Location: Patient: home Provider: office   I discussed the limitations of evaluation and management by telemedicine and the availability of in person appointments. The patient expressed understanding and agreed to proceed.   Follow Up Instructions: I discussed the assessment and treatment plan with the patient. The patient was provided an opportunity to ask questions and all were answered. The patient agreed with the plan and demonstrated an understanding of the instructions.   The patient was advised to call back or seek an in-person evaluation if the symptoms worsen or if the condition fails to improve as anticipated.   Session Time: 45 minutes  Participation Level: Active  Behavioral Response: CasualAlertEuthymic  Type of Therapy: Individual Therapy  Treatment Goals addressed: Stephanie Cloud "Pam" will score less than 12 on the Patient Health Questionnaire (PHQ-9)    ProgressTowards Goals: Progressing  Interventions: CBT  Summary:  Stephanie Reilly is a 53 y.o. female who presents with appointment oriented x 5, appropriately dressed, friendly.  Client denied hallucinations and delusions. Client reported on today she is doing fairly okay.  Client reported she is recovering from having all the colds and viruses that have been going around.  Client reported she has a weakened immune system so she is easily susceptible to catching anything.  Client reported she has her good and bad weeks.  Client reported she has been continuing to work on not over involving herself and things that other people need because it is very tiring.  Client reported she things after her family saw her going through her sickness and in the hospital it finally clicked for them that  they need to help offset her stress and not cause it.  Client reported her daughters have been very supportive of making sure that she gets what she needs.  Client reported she is very mindful of managing her irritability that comes about.  Client reported she has several hobbies that she can do within reason since she is going for a walk and stationary activities like coloring. Evidence of progress towards goal:  client reported engaging in at least 2 positive behavioral activation activities at least 2 days a week.    Suicidal/Homicidal: Nowithout intent/plan  Therapist Response:  Therapist began the appointment asking how she has been doing. Therapist engaged with active listening and positive emotional support. Therapist used cbt to give her time to discuss her thoughts and feeling about her health and everyday stress. Therapist used cbt to teach mindfulness and positive coping skills for anger. Therapist used CBT ask the client to identify her progress with frequency of use with coping skills with continued practice in her daily activity.    Therapist assigned the client homework to practice self care.   Plan: Return again in 4 weeks.  Diagnosis: MDD, recurrent episode, moderate with anxious distress  Collaboration of Care: Patient refused AEB none requested by the client.  Patient/Guardian was advised Release of Information must be obtained prior to any record release in order to collaborate their care with an outside provider. Patient/Guardian was advised if they have not already done so to contact the registration department to sign all necessary forms in order for [place] to release information regarding their care.   Consent: Patient/Guardian gives verbal consent for treatment and assignment  of benefits for services provided during this visit. Patient/Guardian expressed understanding and agreed to proceed.   Neena Rhymes Adhvik Canady, LCSW 01/08/2024

## 2024-01-11 DIAGNOSIS — I509 Heart failure, unspecified: Secondary | ICD-10-CM | POA: Diagnosis not present

## 2024-01-12 DIAGNOSIS — I509 Heart failure, unspecified: Secondary | ICD-10-CM | POA: Diagnosis not present

## 2024-01-13 DIAGNOSIS — I509 Heart failure, unspecified: Secondary | ICD-10-CM | POA: Diagnosis not present

## 2024-01-14 DIAGNOSIS — I509 Heart failure, unspecified: Secondary | ICD-10-CM | POA: Diagnosis not present

## 2024-01-15 DIAGNOSIS — I509 Heart failure, unspecified: Secondary | ICD-10-CM | POA: Diagnosis not present

## 2024-01-16 DIAGNOSIS — I509 Heart failure, unspecified: Secondary | ICD-10-CM | POA: Diagnosis not present

## 2024-01-17 DIAGNOSIS — Z419 Encounter for procedure for purposes other than remedying health state, unspecified: Secondary | ICD-10-CM | POA: Diagnosis not present

## 2024-01-17 DIAGNOSIS — I509 Heart failure, unspecified: Secondary | ICD-10-CM | POA: Diagnosis not present

## 2024-01-18 DIAGNOSIS — I509 Heart failure, unspecified: Secondary | ICD-10-CM | POA: Diagnosis not present

## 2024-01-19 DIAGNOSIS — I509 Heart failure, unspecified: Secondary | ICD-10-CM | POA: Diagnosis not present

## 2024-01-20 DIAGNOSIS — I509 Heart failure, unspecified: Secondary | ICD-10-CM | POA: Diagnosis not present

## 2024-01-21 ENCOUNTER — Ambulatory Visit (HOSPITAL_COMMUNITY): Admission: RE | Admit: 2024-01-21 | Source: Ambulatory Visit

## 2024-01-21 ENCOUNTER — Encounter (HOSPITAL_COMMUNITY): Admitting: Cardiology

## 2024-01-21 DIAGNOSIS — I509 Heart failure, unspecified: Secondary | ICD-10-CM | POA: Diagnosis not present

## 2024-01-22 ENCOUNTER — Ambulatory Visit: Payer: Self-pay

## 2024-01-22 DIAGNOSIS — I509 Heart failure, unspecified: Secondary | ICD-10-CM | POA: Diagnosis not present

## 2024-01-22 NOTE — Telephone Encounter (Signed)
 Called & spoke to the patient. Verified name & DOB. Scheduled a video visit for 01/26/2024 to address concerns. Informed to hold next dose of Wegovy. Patient confirmed appointment and expressed verbal understanding of all discussed.

## 2024-01-22 NOTE — Telephone Encounter (Signed)
 Chief Complaint: bleeding gums Symptoms: bleeding gums, facial rash, gastric reflux, vomiting, abd pain Frequency: since Monday Pertinent Negatives: Patient denies CP, dizziness/lightheadedness, rectal bleeding, facial swelling Disposition: [] ED /[x] Urgent Care (no appt availability in office) / [] Appointment(In office/virtual)/ []  [facility]/ [] Home Care/ [] Refused Recommended Disposition /[] [facility]/ []  Follow-up with PCP Additional Notes: Pt reports multiple symptoms. On Monday pt took her 2.4mg  injection of Wegovy and 30 minutes later noticed her gums were bleeding. Pt takes Eliquis. Pt states she has never had bleeding gums before and had not flossed or brushed her teeth prior to the bleeding. Pt states her gums were bleeding until this AM, now it has resolved. For 2-3 days after taking the injection, she had abd pain. That has resolved. On Monday after taking the injection she developed a red, itchy rash across her face that looked like her skin is peeling. Pt used witch hazel, vaseline, and hydrocortisone cream and states the rash has improved. Denies itching at this time. Pt reports gastric reflux for 2 months that is severe, keeping her up at night, and resulting in vomiting and dry heaving. Pt states anxiety surrounding a family member that has recently passed away may be exacerbating the gastric reflux. Pt states she has SOB during dry heaving and then feels better after taking deep breaths. Pt denies CP. Pt denies rectal bleeding, dizziness/lightheadedness, facial swelling. Denies feeling like her throat was tightening with the rash after [facility]. RN advised pt she should be seen within 24 hours since she is taking Eliquis even though the bleeding gums have resolved. No availability in the office during that time frame. RN advised pt she should go to an UC. Pt states she can't right now as she does not have transportation but she will try once a family member is able to  bring her. RN advised pt RN would reach out to the office for follow-up as well. Pt is due to take [facility] Monday but states she is not going to take it d/t side effects. RN advised pt if she develops CP, SOB, rectal bleeding, dizziness, bleeding of any kind, or intractable vomiting she needs to call 911. Pt verbalized understanding.        Copied from CRM [id number]. Topic: Clinical - Red Word Triage >> Jan 22, 2024 10:48 AM Stephanie Reilly wrote: Red Word that prompted transfer to Nurse Triage: Patient is stating that she has rash on her face and gums are bleeding from wegovy. Reason for Disposition  [1] Gum bleeding AND [2] taking Coumadin (warfarin) or other strong blood thinner, or known bleeding disorder (e.g., thrombocytopenia)  Answer Assessment - Initial Assessment Questions 1. SYMPTOM: "What's the main symptom you're concerned about?" (e.g., chapped lips, dry mouth, lump, sores)     Bleeding to the gums 2. ONSET: "When did the bleeding  start?"     Monday 30 mins after taking Wegovy shot 3. PAIN: "Is there any pain?" If Yes, ask: "How bad is it?" (Scale: 1-10; mild, moderate, severe)   - MILD (1-3):  doesn't interfere with eating or normal activities   - MODERATE (4-7): interferes with eating some solids and normal activities   - SEVERE (8-10):  excruciating pain, interferes with most normal activities   - SEVERE DYSPHAGIA: can't swallow liquids, drooling     No mouth pain 4. CAUSE: "What do you think is causing the symptoms?"     Wegovy  5. OTHER SYMPTOMS: "Do you have any other symptoms?" (e.g., fever, sore  throat, toothache, swelling)     Took Wegovy injection on Monday after taking her other medications, then 30 minutes later she noticed her gums were bleeding. Then she noticed itchy spots on her face. Provider told her to stop taking the medication if she developed side effects. Then Tuesday she started experiencing abdominal pain. States she is d/t take it Monday but will not take  it. Abdominal pain resolved 2-3 days after taking the shot. Pt states she has recently developed acid reflux "its really really bad" for 2 months, it keeps me up. Pt states she has tried tums and they aren't working. Pt states other meds are too expensive. Curious about a prescription for acid reflux. Pt states she's had a recent death in the family and has worsening anxiety, wonders if anxiety is making acid reflux worse. Denies caffeine. States grief makes her "stomach churn." Endorses frequent belching and vomiting. Vomited 2x yesterday, none today. Pt endorses SOB during dry heaving, states she takes deep breaths and feels better.  Pt states the rash is going away. Pt put vaseline and hydrocortisone cream on her face after using witch hazel. Pt denies difficulty breathing, facial swelling, tongue swelling. Pt reports the rash was "very itchy" and red, looked like her skin was peeling at that time. Denies itching or pain at this time.   Bleeding gums stopped this AM. Started Monday. Denies she has ever had bleeding from her gums (on eliquis). Pt states her gums never bleed with flossing or brushing and that did not cause bleeding this time.  This is the 5th week of taking 2.4mg , never had these side effects before this week. Was taking a lower dose before.  Protocols used: Mouth Symptoms-A-AH

## 2024-01-22 NOTE — Telephone Encounter (Signed)
 Video visit is ok

## 2024-01-22 NOTE — Telephone Encounter (Signed)
 Will forward to provider for recommendations   Rashiema could we schedule patient an appt with any available provider

## 2024-01-22 NOTE — Telephone Encounter (Signed)
 Okay to hold next dse of [facility].  Give appt with me.

## 2024-01-22 NOTE — Telephone Encounter (Signed)
 Noted.

## 2024-01-23 DIAGNOSIS — I509 Heart failure, unspecified: Secondary | ICD-10-CM | POA: Diagnosis not present

## 2024-01-25 DIAGNOSIS — I509 Heart failure, unspecified: Secondary | ICD-10-CM | POA: Diagnosis not present

## 2024-01-26 ENCOUNTER — Telehealth (HOSPITAL_BASED_OUTPATIENT_CLINIC_OR_DEPARTMENT_OTHER): Admitting: Internal Medicine

## 2024-01-26 ENCOUNTER — Encounter: Payer: Self-pay | Admitting: Internal Medicine

## 2024-01-26 DIAGNOSIS — M170 Bilateral primary osteoarthritis of knee: Secondary | ICD-10-CM

## 2024-01-26 DIAGNOSIS — E66813 Obesity, class 3: Secondary | ICD-10-CM | POA: Diagnosis not present

## 2024-01-26 DIAGNOSIS — I509 Heart failure, unspecified: Secondary | ICD-10-CM | POA: Diagnosis not present

## 2024-01-26 DIAGNOSIS — T50905A Adverse effect of unspecified drugs, medicaments and biological substances, initial encounter: Secondary | ICD-10-CM | POA: Diagnosis not present

## 2024-01-26 NOTE — Progress Notes (Signed)
 Virtual Visit via Video Note  I connected with Stephanie Reilly on 01/26/2024 at 8:18 AM by a video enabled telemedicine application and verified that I am speaking with the correct person using two identifiers.  Location: Patient: home Provider: Office   I discussed the limitations of evaluation and management by telemedicine and the availability of in person appointments. The patient expressed understanding and agreed to proceed.  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  History of Present Illness   The patient, with a history of obesity and knee osteoarthritis, presents with a rash and gum bleeding after increasing the dose of Wegovy  to 2.4mg . The rash, initially attributed to a food allergy, appears on the face three days after the 4th-6th injection of Wegovy   and resolves after three days. The gum bleeding, which was mild and self-limited, occurred once after the fifth or sixth injection. The patient also reports abdominal pain, which was a previously discussed side effect of Wegovy . Due to take an injection today but decided to hold off on doing so due to these concerns.  She has decided to continue managing her weight through diet and exercise.  Reports that she is eating smaller portions and drinking mainly water.  She is going to the gym daily for low impact exercises and using the machines like the ab machine and leg curls.  She reports that her knees and lower back have began to bother her little bit more.  She has been using her cane more.  She has seen Ortho in the past and given steroid injections which she did not find helpful.       Outpatient Encounter Medications as of 01/26/2024  Medication Sig   Accu-Chek Softclix  Lancets lancets Check blood sugars 7 times daily.   apixaban  (ELIQUIS ) 5 MG TABS tablet Take 1 tablet (5 mg total) by mouth 2 (two) times daily.   Ascorbic Acid (VITAMIN C) 100 MG tablet Take 100 mg by mouth daily.   Biotin (BIOTIN MAXIMUM STRENGTH) 10 MG TABS Take 10 mg by mouth daily.   Blood Glucose Monitoring Suppl (ACCU-CHEK GUIDE) w/Device KIT Check blood sugars 7 times daily.   carvedilol  (COREG ) 25 MG tablet Take 1 tablet (25 mg total) by mouth 2 (two) times daily with a meal.   cetirizine (ZYRTEC) 10 MG tablet Take 10 mg by mouth daily.   dapagliflozin  propanediol (FARXIGA ) 10 MG TABS tablet Take 1 tablet (10 mg total) by mouth daily.   fluticasone (FLONASE) 50 MCG/ACT nasal spray Place 1 spray into both nostrils daily.   glucose blood (ACCU-CHEK GUIDE TEST) test strip Check blood sugars 7 times daily.   polyethylene glycol powder (GLYCOLAX /MIRALAX ) 17 GM/SCOOP powder MIX 17 GRAMS WITH LIQUID AND DRINK EVERY DAY AS NEEDED   rosuvastatin  (CRESTOR ) 20 MG tablet Take 1 tablet (20 mg total) by mouth daily.   sacubitril -valsartan  (ENTRESTO ) 97-103 MG TAKE 1 TABLET BY MOUTH TWICE DAILY   Semaglutide -Weight Management 2.4 MG/0.75ML SOAJ Inject 2.4 mg into the skin once a week.   senna-docusate (SENOKOT-S) 8.6-50 MG tablet Take 1 tablet by mouth at bedtime as needed for mild constipation.   simethicone (MYLICON) 80 MG chewable tablet Chew 80 mg by mouth daily.   spironolactone  (ALDACTONE ) 50 MG tablet Take  1 tablet (50 mg total) by mouth daily.   torsemide  (DEMADEX ) 20 MG tablet Take 1 tablet (20 mg total) by mouth daily as needed (for increased edema).   No facility-administered encounter medications on file as of 01/26/2024.      Observations/Objective: Middle-age African-American female sitting in chair in NAD  Assessment and Plan: 1. Adverse effect of drug, initial encounter (Primary) Patient reporting facial rash that last for 3 days after Wegovy  injection since we increased the dose  to the 2.4 mg.  Also reports abdominal pain and 1 episode of gum bleeding.  I do not think the gum bleeding is related to the Wegovy  but the rash and abdominal pain possibly can be.  We decided to stop the Wegovy .  She will continue to focus on healthy eating habits and trying to move as much as she can.  2. Obesity, Class III, BMI 40-49.9 (morbid obesity) (HCC) See #1 above.  3. Primary osteoarthritis of both knees Discussed the importance of weight loss to help get some of the mechanical strain off her knees.  She declines referral back to orthopedics at this time.   Follow Up Instructions: As previously scheduled.   I discussed the assessment and treatment plan with the patient. The patient was provided an opportunity to ask questions and all were answered. The patient agreed with the plan and demonstrated an understanding of the instructions.   The patient was advised to call back or seek an in-person evaluation if the symptoms worsen or if the condition fails to improve as anticipated.  I spent 13 minutes dedicated to the care of this patient on the date of this encounter to include previsit review of of patient's telephone message from last week and my last note, face-to-face time with patient discussing diagnosis and management.  This note has been created with Education officer, environmental. Any transcriptional errors are unintentional.  Concetta Dee, MD

## 2024-01-27 DIAGNOSIS — I509 Heart failure, unspecified: Secondary | ICD-10-CM | POA: Diagnosis not present

## 2024-01-28 DIAGNOSIS — I509 Heart failure, unspecified: Secondary | ICD-10-CM | POA: Diagnosis not present

## 2024-01-29 DIAGNOSIS — I509 Heart failure, unspecified: Secondary | ICD-10-CM | POA: Diagnosis not present

## 2024-01-30 DIAGNOSIS — I509 Heart failure, unspecified: Secondary | ICD-10-CM | POA: Diagnosis not present

## 2024-01-31 DIAGNOSIS — I509 Heart failure, unspecified: Secondary | ICD-10-CM | POA: Diagnosis not present

## 2024-02-01 DIAGNOSIS — I509 Heart failure, unspecified: Secondary | ICD-10-CM | POA: Diagnosis not present

## 2024-02-02 DIAGNOSIS — I509 Heart failure, unspecified: Secondary | ICD-10-CM | POA: Diagnosis not present

## 2024-02-03 DIAGNOSIS — Z7689 Persons encountering health services in other specified circumstances: Secondary | ICD-10-CM | POA: Diagnosis not present

## 2024-02-03 DIAGNOSIS — I509 Heart failure, unspecified: Secondary | ICD-10-CM | POA: Diagnosis not present

## 2024-02-04 DIAGNOSIS — I509 Heart failure, unspecified: Secondary | ICD-10-CM | POA: Diagnosis not present

## 2024-02-04 DIAGNOSIS — I5021 Acute systolic (congestive) heart failure: Secondary | ICD-10-CM | POA: Diagnosis not present

## 2024-02-04 DIAGNOSIS — R159 Full incontinence of feces: Secondary | ICD-10-CM | POA: Diagnosis not present

## 2024-02-04 DIAGNOSIS — R152 Fecal urgency: Secondary | ICD-10-CM | POA: Diagnosis not present

## 2024-02-04 DIAGNOSIS — N3946 Mixed incontinence: Secondary | ICD-10-CM | POA: Diagnosis not present

## 2024-02-05 DIAGNOSIS — I509 Heart failure, unspecified: Secondary | ICD-10-CM | POA: Diagnosis not present

## 2024-02-06 DIAGNOSIS — I509 Heart failure, unspecified: Secondary | ICD-10-CM | POA: Diagnosis not present

## 2024-02-06 DIAGNOSIS — Z7689 Persons encountering health services in other specified circumstances: Secondary | ICD-10-CM | POA: Diagnosis not present

## 2024-02-07 DIAGNOSIS — I509 Heart failure, unspecified: Secondary | ICD-10-CM | POA: Diagnosis not present

## 2024-02-08 DIAGNOSIS — I509 Heart failure, unspecified: Secondary | ICD-10-CM | POA: Diagnosis not present

## 2024-02-09 DIAGNOSIS — I509 Heart failure, unspecified: Secondary | ICD-10-CM | POA: Diagnosis not present

## 2024-02-10 DIAGNOSIS — I509 Heart failure, unspecified: Secondary | ICD-10-CM | POA: Diagnosis not present

## 2024-02-11 DIAGNOSIS — I509 Heart failure, unspecified: Secondary | ICD-10-CM | POA: Diagnosis not present

## 2024-02-12 DIAGNOSIS — I509 Heart failure, unspecified: Secondary | ICD-10-CM | POA: Diagnosis not present

## 2024-02-13 DIAGNOSIS — I509 Heart failure, unspecified: Secondary | ICD-10-CM | POA: Diagnosis not present

## 2024-02-14 DIAGNOSIS — I509 Heart failure, unspecified: Secondary | ICD-10-CM | POA: Diagnosis not present

## 2024-02-15 DIAGNOSIS — I509 Heart failure, unspecified: Secondary | ICD-10-CM | POA: Diagnosis not present

## 2024-02-16 DIAGNOSIS — Z419 Encounter for procedure for purposes other than remedying health state, unspecified: Secondary | ICD-10-CM | POA: Diagnosis not present

## 2024-02-16 DIAGNOSIS — I509 Heart failure, unspecified: Secondary | ICD-10-CM | POA: Diagnosis not present

## 2024-02-17 ENCOUNTER — Other Ambulatory Visit (HOSPITAL_COMMUNITY): Payer: Self-pay | Admitting: Cardiology

## 2024-02-17 DIAGNOSIS — I509 Heart failure, unspecified: Secondary | ICD-10-CM | POA: Diagnosis not present

## 2024-02-17 MED ORDER — CARVEDILOL 25 MG PO TABS: 25.0000 mg | ORAL_TABLET | Freq: Two times a day (BID) | ORAL | 3 refills | Status: AC

## 2024-02-17 MED ORDER — ENTRESTO 97-103 MG PO TABS: 1.0000 | ORAL_TABLET | Freq: Two times a day (BID) | ORAL | 3 refills | Status: AC

## 2024-02-18 DIAGNOSIS — I509 Heart failure, unspecified: Secondary | ICD-10-CM | POA: Diagnosis not present

## 2024-02-19 ENCOUNTER — Ambulatory Visit
Admission: EM | Admit: 2024-02-19 | Discharge: 2024-02-19 | Disposition: A | Discharge status: Home / Self Care | Attending: Internal Medicine | Admitting: Internal Medicine

## 2024-02-19 ENCOUNTER — Ambulatory Visit (HOSPITAL_COMMUNITY): Admitting: Clinical

## 2024-02-19 DIAGNOSIS — I509 Heart failure, unspecified: Secondary | ICD-10-CM | POA: Diagnosis not present

## 2024-02-19 DIAGNOSIS — F331 Major depressive disorder, recurrent, moderate: Secondary | ICD-10-CM

## 2024-02-19 DIAGNOSIS — L02213 Cutaneous abscess of chest wall: Secondary | ICD-10-CM

## 2024-02-19 MED ORDER — DOXYCYCLINE HYCLATE 100 MG PO CAPS: 100.0000 mg | ORAL_CAPSULE | Freq: Two times a day (BID) | ORAL | 0 refills | Status: AC

## 2024-02-19 NOTE — ED Triage Notes (Signed)
 Pt presents due to abscess on chest wall between breast. She first noticed April 18th and states it popped on the 19th. Since then abscess has come back

## 2024-02-19 NOTE — ED Provider Notes (Signed)
 Stephanie Reilly    CSN: [id number] Arrival date & time: 02/19/24  1307      History   Chief Complaint Chief Complaint  Patient presents with   Abscess    HPI Stephanie Reilly is a 53 y.o. female.   Stephanie Reilly is a 53 y.o. female presenting for chief complaint of Abscess to the chest wall that she first noticed a few days ago. Similar abscess appeared April 18th then drained on its own the next day on the 19th. Abscess is approximately the size of a large pea, is tender, and filled with fluid. Denies spreading redness and warmth to the soft tissues of the breasts. No recent fevers, chills, nausea, vomiting, body aches, or recent antibiotic/steroid use. Denies recent injuries to the chest wall.    Abscess   Past Medical History:  Diagnosis Date   Anemia    Anxiety    Arthritis    Depression    Hypertension    Prediabetes     Patient Active Problem List   Diagnosis Date Noted   Type 2 diabetes mellitus with complication, without long-term current use of insulin  (HCC) 10/26/2023   VT (ventricular tachycardia) (HCC) 05/08/2023   Osteoarthritis of both knees 03/03/2023   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) 02/15/2023   Shock circulatory (HCC) 02/15/2023   Iron deficiency anemia 02/09/2023   Heart failure (HCC) 02/08/2023   Hypokalemia 02/08/2023   Acute on chronic combined systolic and diastolic CHF (congestive heart failure) (HCC) 02/07/2023   Essential hypertension 02/07/2023   Elevated troponin 02/07/2023   Obesity, Class III, BMI 40-49.9 (morbid obesity) 02/07/2023    Past Surgical History:  Procedure Laterality Date   ABDOMINAL HYSTERECTOMY     IR CT HEAD LTD  02/15/2023   IR PERCUTANEOUS ART THROMBECTOMY/INFUSION INTRACRANIAL INC DIAG ANGIO  02/15/2023   IR US  GUIDE VASC ACCESS RIGHT  02/15/2023   RADIOLOGY WITH ANESTHESIA N/A 02/15/2023   Procedure: IR WITH ANESTHESIA;  Surgeon: Radiologist, Medication, MD;  Location: [facility] OR;   Service: Radiology;  Laterality: N/A;   RIGHT/LEFT HEART CATH AND CORONARY ANGIOGRAPHY N/A 02/10/2023   Procedure: RIGHT/LEFT HEART CATH AND CORONARY ANGIOGRAPHY;  Surgeon: [place], Peter M, MD;  Location: [facility] INVASIVE CV LAB;  Service: Cardiovascular;  Laterality: N/A;   TONSILLECTOMY     TUBAL LIGATION      OB History   No obstetric history on file.      Home Medications    Prior to Admission medications   Medication Sig Start Date End Date Taking? Authorizing Provider  doxycycline (VIBRAMYCIN) 100 MG capsule Take 1 capsule (100 mg total) by mouth 2 (two) times daily for 7 days. 02/19/24 02/26/24 Yes StanhopeDanny Dye, FNP  Accu-Chek Softclix Lancets lancets Check blood sugars 7 times daily. 08/21/23   Lawrance Presume, MD  apixaban  (ELIQUIS ) 5 MG TABS tablet Take 1 tablet (5 mg total) by mouth 2 (two) times daily. 08/20/23   Sabharwal, Aditya, DO  Ascorbic Acid (VITAMIN C) 100 MG tablet Take 100 mg by mouth daily.    [provider]  Biotin (BIOTIN MAXIMUM STRENGTH) 10 MG TABS Take 10 mg by mouth daily. 08/04/17   [provider]  Blood Glucose Monitoring Suppl (ACCU-CHEK GUIDE) w/Device KIT Check blood sugars 7 times daily. 08/21/23   Lawrance Presume, MD  carvedilol  (COREG ) 25 MG tablet Take 1 tablet (25 mg total) by mouth 2 (two) times daily with a meal. 02/17/24   Sabharwal, Aditya, DO  cetirizine (ZYRTEC) 10 MG tablet Take 10 mg by mouth daily.    [provider]  dapagliflozin  propanediol (FARXIGA ) 10 MG TABS tablet Take 1 tablet (10 mg total) by mouth daily. 08/20/23   Sabharwal, Aditya, DO  fluticasone (FLONASE) 50 MCG/ACT nasal spray Place 1 spray into both nostrils daily. 05/08/23   [provider]  glucose blood (ACCU-CHEK GUIDE TEST) test strip Check blood sugars 7 times daily. 08/21/23   Lawrance Presume, MD  polyethylene glycol powder (GLYCOLAX /MIRALAX ) 17 GM/SCOOP powder MIX 17 GRAMS WITH LIQUID AND DRINK EVERY DAY AS NEEDED 09/11/23    Lawrance Presume, MD  rosuvastatin  (CRESTOR ) 20 MG tablet Take 1 tablet (20 mg total) by mouth daily. 08/20/23   Sabharwal, Aditya, DO  sacubitril -valsartan  (ENTRESTO ) 97-103 MG Take 1 tablet by mouth 2 (two) times daily. 02/17/24   Sabharwal, Aditya, DO  senna-docusate (SENOKOT-S) 8.6-50 MG tablet Take 1 tablet by mouth at bedtime as needed for mild constipation. 02/20/23   Lehner, Erin C, NP  simethicone (MYLICON) 80 MG chewable tablet Chew 80 mg by mouth daily.    [provider]  spironolactone  (ALDACTONE ) 50 MG tablet Take 1 tablet (50 mg total) by mouth daily. 06/04/23   Sabharwal, Aditya, DO  torsemide  (DEMADEX ) 20 MG tablet Take 1 tablet (20 mg total) by mouth daily as needed (for increased edema). 05/16/23   Lawrance Presume, MD    Family History Family History  Problem Relation Age of Onset   Hypertension Mother    Heart failure Mother    COPD Mother    Diabetes Paternal Grandfather     Social History Social History   Tobacco Use   Smoking status: Former    Current packs/day: 0.00    Average packs/day: 0.5 packs/day for 11.0 years (5.5 ttl pk-yrs)    Types: Cigarettes    Start date: 10/2010    Quit date: 10/2021    Years since quitting: 2.3    Passive exposure: Past   Smokeless tobacco: Never  Vaping Use   Vaping status: Never Used  Substance Use Topics   Alcohol use: Never   Drug use: Yes    Types: Marijuana    Comment: routine use of CBD edibles     Allergies   Paroxetine hcl, Prednisone, Chocolate, Ibuprofen, Iron, Latex, Lisinopril, Naproxen, Tramadol, Codeine, Penicillins, and Serotonin   Review of Systems Review of Systems Per HPI  Physical Exam Triage Vital Signs ED Triage Vitals  Encounter Vitals Group     BP 02/19/24 1331 (!) 144/93     Systolic BP Percentile --      Diastolic BP Percentile --      Pulse Rate 02/19/24 1328 96     Resp 02/19/24 1328 16     Temp 02/19/24 1328 97.9 F (36.6 C)     Temp Source 02/19/24 1328 Oral      SpO2 02/19/24 1328 95 %     Weight --      Height --      Head Circumference --      Peak Flow --      Pain Score 02/19/24 1327 7     Pain Loc --      Pain Education --      Exclude from Growth Chart --    No data found.  Updated Vital Signs BP (!) 144/93 (BP Location: Right Arm)   Pulse 96   Temp 97.9 F (36.6 C) (Oral)   Resp 16  SpO2 95%   Visual Acuity Right Eye Distance:   Left Eye Distance:   Bilateral Distance:    Right Eye Near:   Left Eye Near:    Bilateral Near:     Physical Exam Vitals and nursing note reviewed. Exam conducted with a chaperone present Lane Pinon, RN).  Constitutional:      Appearance: She is not ill-appearing or toxic-appearing.  HENT:     Head: Normocephalic and atraumatic.     Right Ear: Hearing and external ear normal.     Left Ear: Hearing and external ear normal.     Nose: Nose normal.     Mouth/Throat:     Lips: Pink.  Eyes:     General: Lids are normal. Vision grossly intact. Gaze aligned appropriately.     Extraocular Movements: Extraocular movements intact.     Conjunctiva/sclera: Conjunctivae normal.  Pulmonary:     Effort: Pulmonary effort is normal.  Chest:    Musculoskeletal:     Cervical back: Neck supple.  Skin:    General: Skin is warm and dry.     Capillary Refill: Capillary refill takes less than 2 seconds.     Findings: No rash.  Neurological:     General: No focal deficit present.     Mental Status: She is alert and oriented to person, place, and time. Mental status is at baseline.     Cranial Nerves: No dysarthria or facial asymmetry.  Psychiatric:        Mood and Affect: Mood normal.        Speech: Speech normal.        Behavior: Behavior normal.        Thought Content: Thought content normal.        Judgment: Judgment normal.      Reilly Treatments / Results  Labs (all labs ordered are listed, but only abnormal results are displayed) Labs Reviewed - No data to display  EKG   Radiology No results  found.  Procedures Incision and Drainage  Date/Time: 02/19/2024 1:56 PM  Performed by: Starlene Eaton, FNP Authorized by: Starlene Eaton, FNP   Consent:    Consent obtained:  Verbal   Consent given by:  Patient   Risks, benefits, and alternatives were discussed: yes     Risks discussed:  Bleeding, incomplete drainage, infection, pain and damage to other organs   Alternatives discussed:  No treatment Location:    Type:  Abscess   Size:  1cm   Location:  Trunk   Trunk location:  Chest Pre-procedure details:    Skin preparation:  Antiseptic wash Sedation:    Sedation type:  None Anesthesia:    Anesthesia method:  Topical application   Topical anesthesia: Pain-eeze spray. Procedure type:    Complexity:  Simple Procedure details:    Incision types:  Stab incision (18 guague needle)   Incision depth:  Dermal   Drainage:  Bloody and purulent   Drainage amount:  Scant   Wound treatment:  Wound left open   Packing materials:  None Post-procedure details:    Procedure completion:  Tolerated well, no immediate complications  (including critical care time)  Medications Ordered in Reilly Medications - No data to display  Initial Impression / Assessment and Plan / Reilly Course  I have reviewed the triage vital signs and the nursing notes.  Pertinent labs & imaging results that were available during my care of the patient were reviewed by me and considered in my medical  decision making (see chart for details).   1. Cutaneous abscess of chest wall See incision and drainage note above for further detail regarding incision and drainage procedure, patient tolerated well.   Wound cleansed and dressed in clinic. Wound care discussed (warm compresses, dressing changes, etc).  Doxycycline antibiotic ordered and supportive care discussed/outlined in AVS.   Counseled patient on potential for adverse effects with medications prescribed/recommended today, strict ER and  return-to-clinic precautions discussed, patient verbalized understanding.    Final Clinical Impressions(s) / Reilly Diagnoses   Final diagnoses:  Cutaneous abscess of chest wall     Discharge Instructions      We drained your abscess today in the clinic and left open to continue to drain.  Continue to use warm compresses to the wound to further encourage drainage from the wound.  You may do this in the shower as well with gentle compresses to the area to allow more infected material to drain.   Take antibiotic as prescribed to treat infection to the abscess.   Change your dressings frequently as the wound heals.   You may take over the counter pain medicines as needed for pain (tylenol  1,000mg  every 6 hours as needed).   If you notice any worsening signs of infection such as redness, swelling, fever, or worsening drainage, please return to urgent care for reevaluation.     ED Prescriptions     Medication Sig Dispense Auth. Provider   doxycycline (VIBRAMYCIN) 100 MG capsule Take 1 capsule (100 mg total) by mouth 2 (two) times daily for 7 days. 14 capsule Starlene Eaton, FNP      PDMP not reviewed this encounter.   Starlene Eaton, [place] 02/19/24 1358

## 2024-02-19 NOTE — Discharge Instructions (Signed)
 We drained your abscess today in the clinic and left open to continue to drain.  Continue to use warm compresses to the wound to further encourage drainage from the wound.  You may do this in the shower as well with gentle compresses to the area to allow more infected material to drain.   Take antibiotic as prescribed to treat infection to the abscess.   Change your dressings frequently as the wound heals.   You may take over the counter pain medicines as needed for pain (tylenol  1,000mg  every 6 hours as needed).   If you notice any worsening signs of infection such as redness, swelling, fever, or worsening drainage, please return to urgent care for reevaluation.

## 2024-02-20 ENCOUNTER — Telehealth (HOSPITAL_COMMUNITY): Payer: Self-pay | Admitting: Cardiology

## 2024-02-20 DIAGNOSIS — I509 Heart failure, unspecified: Secondary | ICD-10-CM | POA: Diagnosis not present

## 2024-02-20 NOTE — Telephone Encounter (Signed)
 Patient called front office with questions about her echocardiogram/Dr. Bruce Caper appt on Wednesday, March 17, 2024.   Patient states she received a letter in the mail that her echocardiogram was going to cost $800.00 + and she wanted to know if her echo was authorized.  Patient is concerned that her echo was not authorized and states that it was authorized in the past.   Can someone please reach out to this patient about the prior authorization for her echocardiogram on March 17, 2024?   Patient is concerned about the cost out of pocket estimates.   Patients call back number is [phone].  Please advise.  Thank you.

## 2024-02-21 DIAGNOSIS — I509 Heart failure, unspecified: Secondary | ICD-10-CM | POA: Diagnosis not present

## 2024-02-21 NOTE — Progress Notes (Signed)
 THERAPIST PROGRESS NOTE Virtual Visit via Video Note  I connected with Otelia Hettinger on 02/19/2024 at  3:00 PM EDT by a video enabled telemedicine application and verified that I am speaking with the correct person using two identifiers.  Location: Patient: home Provider: office   I discussed the limitations of evaluation and management by telemedicine and the availability of in person appointments. The patient expressed understanding and agreed to proceed.   Follow Up Instructions: I discussed the assessment and treatment plan with the patient. The patient was provided an opportunity to ask questions and all were answered. The patient agreed with the plan and demonstrated an understanding of the instructions.   The patient was advised to call back or seek an in-person evaluation if the symptoms worsen or if the condition fails to improve as anticipated.   Session Time: 60 minutes  Participation Level: Active  Behavioral Response: CasualAlertEuthymic  Type of Therapy: Individual Therapy  Treatment Goals addressed: Dina Francisco "Pam" will score less than 12 on the Patient Health Questionnaire (PHQ-9)   ProgressTowards Goals: Progressing  Interventions: CBT and Supportive  Summary:  Sharon Rubis is a 53 y.o. female who presents for the scheduled appointment oriented times five, appropriately dressed and friendly. Client denied hallucinations and delusions. Client reported on today she is feeling a little better. Client reported she recently went to urgent care to have abscess treated.  Client reported she is doing better now and has antibiotics.  Client reported patient was mobile but she still has not properly grieved such as she has not yet cried.  Client reported the month of May was difficult because it is the anniversary for other aunts as well so it is a reflective time for her.  Client reported she had a grandmother stay with her kids and her mother.  Client reported she has been  working on being more at ease on being germaphobic when she goes out into public for people coming over to the house.  Client reported she talks to her sister who lives out of state frequently and that helps to keep her spirits uplifted as well. Evidence of progress towards goal: Client reported 1 positive review to family support to help with her depressive symptoms.  Suicidal/Homicidal: Nowithout intent/plan  Therapist Response:  Therapist began the appointment asking the client how she has been doing since last seen. Therapist engaged with active listening and positive emotional support. Therapist used CBT to give the client time to discuss her thoughts and feelings about grief, her health, and continue to move forward with her life. Therapist used CBT to continue teaching the client about grief coping skills and reinforcing the use of positive self-care practices utilizing her support. Therapist used CBT ask the client to identify her progress with frequency of use with coping skills with continued practice in her daily activity.       Plan: Return again in 4 weeks.  Diagnosis: Major depressive disorder, recurrent episode, moderate with anxious distress  Collaboration of Care: Patient refused AEB none requested.  Patient/Guardian was advised Release of Information must be obtained prior to any record release in order to collaborate their care with an outside provider. Patient/Guardian was advised if they have not already done so to contact the registration department to sign all necessary forms in order for us  to release information regarding their care.   Consent: Patient/Guardian gives verbal consent for treatment and assignment of benefits for services provided during this visit. Patient/Guardian expressed understanding and agreed to  proceed.   Cherelle Midkiff Y Zainah Steven, LCSW 02/19/2024

## 2024-02-22 DIAGNOSIS — I509 Heart failure, unspecified: Secondary | ICD-10-CM | POA: Diagnosis not present

## 2024-02-23 DIAGNOSIS — I509 Heart failure, unspecified: Secondary | ICD-10-CM | POA: Diagnosis not present

## 2024-02-23 NOTE — Telephone Encounter (Signed)
 Spoke with patient regarding cost associated with procedure Advised to contact billing for out of pocket cost break down  and or contact insurance for coverage of procedure Procedure is authorized on our end

## 2024-02-24 ENCOUNTER — Encounter: Payer: Self-pay | Admitting: Internal Medicine

## 2024-02-24 ENCOUNTER — Ambulatory Visit: Attending: Internal Medicine | Admitting: Internal Medicine

## 2024-02-24 VITALS — BP 124/80 | HR 83 | Temp 98.6°F | Ht 62.0 in | Wt 254.0 lb

## 2024-02-24 DIAGNOSIS — Z7689 Persons encountering health services in other specified circumstances: Secondary | ICD-10-CM | POA: Diagnosis not present

## 2024-02-24 DIAGNOSIS — F411 Generalized anxiety disorder: Secondary | ICD-10-CM

## 2024-02-24 DIAGNOSIS — Z6841 Body Mass Index (BMI) 40.0 and over, adult: Secondary | ICD-10-CM

## 2024-02-24 DIAGNOSIS — M542 Cervicalgia: Secondary | ICD-10-CM | POA: Diagnosis not present

## 2024-02-24 DIAGNOSIS — F331 Major depressive disorder, recurrent, moderate: Secondary | ICD-10-CM | POA: Diagnosis not present

## 2024-02-24 NOTE — Progress Notes (Signed)
 Patient ID: Stephanie Reilly, female    DOB: 12/30/1970  MRN: [id number]  CC: Obesity (Obesity & symethicone f/u [facility] taking a probiotic with it )   Subjective: Stephanie Reilly is a 53 y.o. female who presents for chronic ds management. 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  History of Present Illness Stephanie Reilly "Stephanie Reilly" is a 53 year old female who presents for a follow-up regarding her weight management.  She has gained ten pounds since March, now weighing 254 pounds, after stopping Wegovy  in April due to adverse reactions. She is actively working on weight reduction through exercise, attending the gym three times a week, and using home equipment when necessary. Her exercise routine includes treadmill use, core exercises, and arm toning with five-pound weights.  Her eating habits are inconsistent, with overeating during periods of high anxiety or depression and reduced intake when her mood is stable. She eats to ensure she can take her medication.  She experiences significant neck pain on the right side, extending from her shoulder to her neck, especially during rainy weather. Tylenol  arthritis only dulls the pain, and she uses heat and a supportive neck pillow for relief. 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  MDD/GAD: She sees a counselor monthly for anxiety and depression, using journaling and deep breathing to manage symptoms. Her anxiety is less frequent and severe, though recent family losses have impacted her emotional state.    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 100 each 12   apixaban  (ELIQUIS ) 5 MG TABS tablet Take 1 tablet (5 mg total) by mouth 2 (two) times daily. 180 tablet 3   Ascorbic Acid (VITAMIN C) 100 MG tablet Take 100 mg by mouth daily.     Biotin (BIOTIN MAXIMUM STRENGTH) 10 MG TABS Take 10 mg by mouth daily.     Blood Glucose Monitoring Suppl (ACCU-CHEK GUIDE) w/Device KIT Check blood sugars 7 times daily. 1 kit 0   carvedilol  (COREG ) 25 MG tablet Take 1 tablet (25 mg total) by mouth 2 (two) times daily with a meal. 180 tablet 3   cetirizine (ZYRTEC) 10 MG tablet Take 10 mg by mouth daily.     dapagliflozin  propanediol (FARXIGA ) 10 MG TABS tablet Take 1 tablet (10 mg total) by mouth daily. 90 tablet 3   doxycycline  (VIBRAMYCIN ) 100 MG capsule Take 1 capsule (100 mg total) by mouth 2 (two) times  daily for 7 days. 14 capsule 0   fluticasone (FLONASE) 50 MCG/ACT nasal spray Place 1 spray into both nostrils daily.     glucose blood (ACCU-CHEK GUIDE TEST) test strip Check blood sugars 7 times daily. 100 each 12   polyethylene glycol powder (GLYCOLAX /MIRALAX ) 17 GM/SCOOP powder MIX 17 GRAMS WITH LIQUID AND DRINK EVERY DAY AS NEEDED 510 g 3   rosuvastatin  (CRESTOR ) 20 MG tablet Take 1 tablet (20 mg total) by  mouth daily. 90 tablet 3   sacubitril -valsartan  (ENTRESTO ) 97-103 MG Take 1 tablet by mouth 2 (two) times daily. 180 tablet 3   senna-docusate (SENOKOT-S) 8.6-50 MG tablet Take 1 tablet by mouth at bedtime as needed for mild constipation. 30 tablet 0   simethicone (MYLICON) 80 MG chewable tablet Chew 80 mg by mouth daily.     spironolactone  (ALDACTONE ) 50 MG tablet Take 1 tablet (50 mg total) by mouth daily. 30 tablet 11   torsemide  (DEMADEX ) 20 MG tablet Take 1 tablet (20 mg total) by mouth daily as needed (for increased edema). 30 tablet 0   No current facility-administered medications on file prior to visit.    Allergies  Allergen Reactions   Paroxetine Hcl Anaphylaxis   Prednisone Shortness Of Breath and Other (See Comments)    Confusion, "messes with mental," muscles feel weird, fatigue, insomnia   Chocolate Other (See Comments)    Unknown    Ibuprofen Other (See Comments)    Unknown    Iron Other (See Comments)    GI upset (reported 08/04/2017). Pt clarifies had constipation, nausea.  Stool softeners did not help.    Latex Other (See Comments)    Unknown    Lisinopril Hives   Naproxen Other (See Comments)    Unknown    Tramadol Hives   Codeine Rash   Penicillins Rash   Serotonin Nausea Only, Swelling, Anxiety, Rash and Other (See Comments)    GI intolerence    Social History   Socioeconomic History   Marital status: Single    Spouse name: Not on file   Number of children: 3   Years of education: Not on file   Highest education level: Associate degree: occupational, Scientist, product/process development, or vocational program  Occupational History   Occupation: self employed  Tobacco Use   Smoking status: Former    Current packs/day: 0.00    Average packs/day: 0.5 packs/day for 11.0 years (5.5 ttl pk-yrs)    Types: Cigarettes    Start date: 10/2010    Quit date: 10/2021    Years since quitting: 2.3    Passive exposure: Past   Smokeless tobacco: Never  Vaping Use   Vaping status: Never  Used  Substance and Sexual Activity   Alcohol use: Never   Drug use: Yes    Types: Marijuana    Comment: routine use of CBD edibles   Sexual activity: Not on file  Other Topics Concern   Not on file  Social History Narrative   Not on file   Social Drivers of Health   Financial Resource Strain: High Risk (09/22/2023)   Overall Financial Resource Strain (CARDIA)    Difficulty of Paying Living Expenses: Very hard  Food Insecurity: Food Insecurity Present (09/22/2023)   Hunger Vital Sign    Worried About Running Out of Food in the Last Year: Often true    Ran Out of Food in the Last Year: Often true  Transportation Needs: No Transportation Needs (09/22/2023)   PRAPARE - Transportation    Lack  of Transportation (Medical): No    Lack of Transportation (Non-Medical): No  Recent Concern: Transportation Needs - Unmet Transportation Needs (07/21/2023)   PRAPARE - Transportation    Lack of Transportation (Medical): Yes    Lack of Transportation (Non-Medical): Yes  Physical Activity: Sufficiently Active (09/22/2023)   Exercise Vital Sign    Days of Exercise per Week: 7 days    Minutes of Exercise per Session: 30 min  Recent Concern: Physical Activity - Insufficiently Active (07/25/2023)   Exercise Vital Sign    Days of Exercise per Week: 3 days    Minutes of Exercise per Session: 20 min  Stress: Stress Concern Present (09/22/2023)   [employer] of Occupational Health - Occupational Stress Questionnaire    Feeling of Stress : Very much  Social Connections: Socially Isolated (09/22/2023)   Social Connection and Isolation Panel [NHANES]    Frequency of Communication with Friends and Family: Once a week    Frequency of Social Gatherings with Friends and Family: Once a week    Attends Religious Services: 1 to 4 times per year    Active Member of [employer] or Organizations: No    Attends Banker Meetings: Never    Marital Status: Never married  Intimate Partner Violence:  Not At Risk (07/25/2023)   Humiliation, Afraid, Rape, and Kick questionnaire    Fear of Current or Ex-Partner: No    Emotionally Abused: No    Physically Abused: No    Sexually Abused: No    Family History  Problem Relation Age of Onset   Hypertension Mother    Heart failure Mother    COPD Mother    Diabetes Paternal Grandfather     Past Surgical History:  Procedure Laterality Date   ABDOMINAL HYSTERECTOMY     IR CT HEAD LTD  02/15/2023   IR PERCUTANEOUS ART THROMBECTOMY/INFUSION INTRACRANIAL INC DIAG ANGIO  02/15/2023   IR US  GUIDE VASC ACCESS RIGHT  02/15/2023   RADIOLOGY WITH ANESTHESIA N/A 02/15/2023   Procedure: IR WITH ANESTHESIA;  Surgeon: Radiologist, Medication, MD;  Location: [facility] OR;  Service: Radiology;  Laterality: N/A;   RIGHT/LEFT HEART CATH AND CORONARY ANGIOGRAPHY N/A 02/10/2023   Procedure: RIGHT/LEFT HEART CATH AND CORONARY ANGIOGRAPHY;  Surgeon: [place], Peter M, MD;  Location: [facility] INVASIVE CV LAB;  Service: Cardiovascular;  Laterality: N/A;   TONSILLECTOMY     TUBAL LIGATION      ROS: Review of Systems Negative except as stated above  PHYSICAL EXAM: BP 124/80 (BP Location: Left Arm, Patient Position: Sitting)   Pulse 83   Temp 98.6 F (37 C) (Oral)   Ht 5\' 2"  (1.575 m)   Wt 254 lb (115.2 kg)   SpO2 99%   BMI 46.46 kg/m   Wt Readings from Last 3 Encounters:  02/24/24 254 lb (115.2 kg)  12/24/23 245 lb (111.1 kg)  10/30/23 246 lb 9.6 oz (111.9 kg)    Physical Exam  General appearance - alert, well appearing, middle age obese AAF and in no distress Mental status - normal mood, behavior, speech, dress, motor activity, and thought processes Chest - clear to auscultation, no wheezes, rales or rhonchi, symmetric air entry Heart - normal rate, regular rhythm, normal S1, S2, no murmurs, rubs, clicks or gallops Extremities - peripheral pulses normal, no pedal edema, no clubbing or cyanosis MSK: Mild tenderness over the trapezius muscle on the right side.   Good range of motion of the neck.    02/24/2024   10:03 AM  12/24/2023   11:33 AM 09/29/2023    3:13 PM  Depression screen PHQ 2/9  Decreased Interest 2 1   Down, Depressed, Hopeless 2 2   PHQ - 2 Score 4 3   Altered sleeping 3 3   Tired, decreased energy 3 2   Change in appetite 3 2   Feeling bad or failure about yourself  1 1   Trouble concentrating 0 0   Moving slowly or fidgety/restless 0 0   Suicidal thoughts 0 0   PHQ-9 Score 14 11   Difficult doing work/chores Extremely dIfficult Somewhat difficult      Information is confidential and restricted. Go to Review Flowsheets to unlock data.        Latest Ref Rng & Units 10/30/2023   11:04 AM 07/22/2023    3:30 PM 06/12/2023    8:59 AM  CMP  Glucose 70 - 99 mg/dL 97  161  096   BUN 6 - 20 mg/dL 15  14  15    Creatinine 0.44 - 1.00 mg/dL 0.45  4.09  8.11   Sodium 135 - 145 mmol/L 136  137  137   Potassium 3.5 - 5.1 mmol/L 4.4  4.6  3.6   Chloride 98 - 111 mmol/L 102  104  104   CO2 22 - 32 mmol/L 24  25  26    Calcium  8.9 - 10.3 mg/dL 91.4  78.2  9.9    Lipid Panel     Component Value Date/Time   CHOL 155 02/16/2023 0945   TRIG 62 02/16/2023 0945   HDL 45 02/16/2023 0945   CHOLHDL 3.4 02/16/2023 0945   VLDL 12 02/16/2023 0945   LDLCALC 98 02/16/2023 0945    CBC    Component Value Date/Time   WBC 10.4 02/28/2023 1028   RBC 5.14 (H) 02/28/2023 1028   HGB 13.7 02/28/2023 1028   HCT 43.5 02/28/2023 1028   PLT 333 02/28/2023 1028   MCV 84.6 02/28/2023 1028   MCH 26.7 02/28/2023 1028   MCHC 31.5 02/28/2023 1028   RDW 15.3 02/28/2023 1028   LYMPHSABS 3.3 02/15/2023 1245   MONOABS 0.9 02/15/2023 1245   EOSABS 0.1 02/15/2023 1245   BASOSABS 0.1 02/15/2023 1245    ASSESSMENT AND PLAN: 1. Morbid obesity with BMI of 45.0-49.9, adult (HCC) (Primary) Weight gain of 10 pounds post-Wegovy  discontinuation. Active in exercise, inconsistent eating habits influenced by emotions. - Recommend referral to [facility] Weight  Management program for monthly follow-ups and dietary guidance. -Encouraged her to continue her exercise routine. - Amb Ref to Medical Weight Management  2. Major depressive disorder, recurrent episode, moderate (HCC) 3. GAD (generalized anxiety disorder) She is plugged in with behavioral health.  Discussed medication and patient still does not feel that she needs to be on medication at this time.  4. Neck pain Pain may be coming from the neck itself or the shoulder.  The fact that the neck pain is worse in inclement weather suggest that it may be arthritis of the cervical spine.  We will get an x-ray for starters. - DG Cervical Spine Complete; Future   Patient was given the opportunity to ask questions.  Patient verbalized understanding of the plan and was able to repeat key elements of the plan.   This documentation was completed using Paediatric nurse.  Any transcriptional errors are unintentional.  Orders Placed This Encounter  Procedures   DG Cervical Spine Complete   Amb Ref to Medical Weight Management  Requested Prescriptions    No prescriptions requested or ordered in this encounter    Return in about 4 months (around 06/26/2024).  Concetta Dee, MD, FACP

## 2024-02-24 NOTE — Patient Instructions (Signed)
 VISIT SUMMARY:  Today, we discussed your weight management, neck pain, shoulder arthritis, and emotional health. You have gained ten pounds since stopping Wegovy  and are actively working on weight reduction through exercise. We also addressed your neck pain, which may be due to arthritis, and your shoulder arthritis. Additionally, we talked about your anxiety and depression, which affect your eating habits.  YOUR PLAN:  -NECK PAIN POSSIBLY DUE TO ARTHRITIS: Your neck pain is likely due to arthritis, which is a condition where the joints become inflamed. We will order an x-ray of your neck to evaluate this further. You can go to [facility] for the x-ray without needing an appointment.   -OBESITY: You have gained weight since stopping Wegovy . Obesity is a condition where excess body fat may affect your health. You are actively exercising, but your eating habits are inconsistent. We will refer you to the [facility] Medical Weight Management program for monthly follow-ups and dietary guidance.  -DEPRESSION AND ANXIETY: Your emotional health, including anxiety and depression, affects your eating habits. You are seeing a counselor monthly and using self-management techniques like journaling and deep breathing. You prefer to manage these conditions without medication.  INSTRUCTIONS:  Please go to [facility] for your neck x-ray. We will also submit a referral to the [facility] Medical Weight Management program for you to receive monthly follow-ups and dietary guidance.

## 2024-02-25 ENCOUNTER — Encounter (INDEPENDENT_AMBULATORY_CARE_PROVIDER_SITE_OTHER): Payer: Self-pay

## 2024-02-25 DIAGNOSIS — I509 Heart failure, unspecified: Secondary | ICD-10-CM | POA: Diagnosis not present

## 2024-02-26 DIAGNOSIS — I509 Heart failure, unspecified: Secondary | ICD-10-CM | POA: Diagnosis not present

## 2024-02-27 ENCOUNTER — Ambulatory Visit
Admission: RE | Admit: 2024-02-27 | Discharge: 2024-02-27 | Disposition: A | Discharge status: Home / Self Care | Source: Ambulatory Visit | Attending: Internal Medicine | Admitting: Internal Medicine

## 2024-02-27 DIAGNOSIS — G8929 Other chronic pain: Secondary | ICD-10-CM | POA: Diagnosis not present

## 2024-02-27 DIAGNOSIS — M542 Cervicalgia: Secondary | ICD-10-CM

## 2024-02-27 DIAGNOSIS — I509 Heart failure, unspecified: Secondary | ICD-10-CM | POA: Diagnosis not present

## 2024-02-28 DIAGNOSIS — I509 Heart failure, unspecified: Secondary | ICD-10-CM | POA: Diagnosis not present

## 2024-02-29 DIAGNOSIS — I509 Heart failure, unspecified: Secondary | ICD-10-CM | POA: Diagnosis not present

## 2024-03-02 DIAGNOSIS — I509 Heart failure, unspecified: Secondary | ICD-10-CM | POA: Diagnosis not present

## 2024-03-03 DIAGNOSIS — I509 Heart failure, unspecified: Secondary | ICD-10-CM | POA: Diagnosis not present

## 2024-03-04 DIAGNOSIS — I509 Heart failure, unspecified: Secondary | ICD-10-CM | POA: Diagnosis not present

## 2024-03-05 DIAGNOSIS — I509 Heart failure, unspecified: Secondary | ICD-10-CM | POA: Diagnosis not present

## 2024-03-06 DIAGNOSIS — I509 Heart failure, unspecified: Secondary | ICD-10-CM | POA: Diagnosis not present

## 2024-03-07 DIAGNOSIS — I509 Heart failure, unspecified: Secondary | ICD-10-CM | POA: Diagnosis not present

## 2024-03-08 ENCOUNTER — Ambulatory Visit: Payer: Self-pay | Admitting: Nurse Practitioner

## 2024-03-08 ENCOUNTER — Other Ambulatory Visit: Payer: Self-pay | Admitting: Nurse Practitioner

## 2024-03-08 ENCOUNTER — Telehealth: Payer: Self-pay | Admitting: Internal Medicine

## 2024-03-08 DIAGNOSIS — M542 Cervicalgia: Secondary | ICD-10-CM

## 2024-03-08 DIAGNOSIS — I509 Heart failure, unspecified: Secondary | ICD-10-CM | POA: Diagnosis not present

## 2024-03-08 MED ORDER — DICLOFENAC SODIUM 1 % EX GEL: 2.0000 g | Freq: Four times a day (QID) | CUTANEOUS | 0 refills | Status: DC

## 2024-03-08 NOTE — Telephone Encounter (Signed)
 Copied from CRM [id number]. Topic: Clinical - Lab/Test Results >> Mar 08, 2024 11:23 AM Rosaria Common wrote:  Reason for CRM: Pt calling to gain clarification on neck x ray test results due to still being in severe pain. Callback number is [phone].

## 2024-03-09 ENCOUNTER — Telehealth (HOSPITAL_COMMUNITY): Payer: Self-pay | Admitting: Pharmacy Technician

## 2024-03-09 ENCOUNTER — Other Ambulatory Visit (HOSPITAL_COMMUNITY): Payer: Self-pay

## 2024-03-09 DIAGNOSIS — N3946 Mixed incontinence: Secondary | ICD-10-CM | POA: Diagnosis not present

## 2024-03-09 DIAGNOSIS — I509 Heart failure, unspecified: Secondary | ICD-10-CM | POA: Diagnosis not present

## 2024-03-09 DIAGNOSIS — R152 Fecal urgency: Secondary | ICD-10-CM | POA: Diagnosis not present

## 2024-03-09 DIAGNOSIS — I5021 Acute systolic (congestive) heart failure: Secondary | ICD-10-CM | POA: Diagnosis not present

## 2024-03-09 DIAGNOSIS — R159 Full incontinence of feces: Secondary | ICD-10-CM | POA: Diagnosis not present

## 2024-03-09 MED ORDER — DAPAGLIFLOZIN PROPANEDIOL 10 MG PO TABS
10.0000 mg | ORAL_TABLET | Freq: Every day | ORAL | 3 refills | Status: AC
Start: 2024-03-09 — End: ?

## 2024-03-09 NOTE — Telephone Encounter (Addendum)
 Advanced Heart Failure Patient Advocate Encounter  Prior Authorization for Farxiga  has been approved.    PA# [id number] Effective dates: 02/24/24 through 03/09/25  Called and spoke with the patient.   Correne Dillon, CPhT

## 2024-03-09 NOTE — Telephone Encounter (Signed)
 Patient Advocate Encounter   Received notification from [facility] that prior authorization for Farxiga  is required.   PA submitted on CoverMyMeds Key [id number] Status is pending   Will continue to follow. Will update patient once determination is received.

## 2024-03-09 NOTE — Telephone Encounter (Signed)
 Patient viewed results and result note via MyChart. Called patient to see if she has additional questions.   Written by Collins Dean, NP on 03/08/2024  5:11 PM EDT Seen by patient Stephanie Reilly on 03/08/2024  5:27 PM   Patient wanted to let Dr. Lincoln Renshaw she has enlisted in the Good Measures program through [facility]. Dietician and [employer] program. She will speak with them on June 13 for the first appointment.

## 2024-03-10 DIAGNOSIS — I509 Heart failure, unspecified: Secondary | ICD-10-CM | POA: Diagnosis not present

## 2024-03-11 ENCOUNTER — Ambulatory Visit (INDEPENDENT_AMBULATORY_CARE_PROVIDER_SITE_OTHER): Admitting: Clinical

## 2024-03-11 DIAGNOSIS — I509 Heart failure, unspecified: Secondary | ICD-10-CM | POA: Diagnosis not present

## 2024-03-11 DIAGNOSIS — F331 Major depressive disorder, recurrent, moderate: Secondary | ICD-10-CM

## 2024-03-12 DIAGNOSIS — I509 Heart failure, unspecified: Secondary | ICD-10-CM | POA: Diagnosis not present

## 2024-03-13 DIAGNOSIS — I509 Heart failure, unspecified: Secondary | ICD-10-CM | POA: Diagnosis not present

## 2024-03-13 NOTE — Progress Notes (Signed)
 THERAPIST PROGRESS NOTE Virtual Visit via Video Note  I connected with Stephanie Reilly on 03/11/2024 at  3:00 PM EDT by a video enabled telemedicine application and verified that I am speaking with the correct person using two identifiers.  Location: Patient: home Provider: office   I discussed the limitations of evaluation and management by telemedicine and the availability of in person appointments. The patient expressed understanding and agreed to proceed.   Follow Up Instructions: I discussed the assessment and treatment plan with the patient. The patient was provided an opportunity to ask questions and all were answered. The patient agreed with the plan and demonstrated an understanding of the instructions.   The patient was advised to call back or seek an in-person evaluation if the symptoms worsen or if the condition fails to improve as anticipated.   Session Time: 60 minutes  Participation Level: Active  Behavioral Response: CasualAlertEuthymic  Type of Therapy: Individual Therapy  Treatment Goals addressed: Stephanie Francisco "Pam" will score less than 12 on the Patient Health Questionnaire (PHQ-9)    ProgressTowards Goals: Progressing  Interventions: CBT and Supportive  Summary:  Stephanie Reilly is a 53 y.o. female who presents for the scheduled appointment oriented x 5, appropriately dressed, and friendly.  Client denied hallucinations and delusions. Client reported on today she has had a mix of emotions.  Client reported she has been working on making some lifestyle choices regarding how she eats and the amount of physical exercise that she does.  Client reported that she has been experimenting with sugar-free desserts.  Client reported since she decided to start taking the Wegovy  shot she did regain 7 pounds but is trying to lose some of the weight again on her own.  Client reported having some negative feelings about her last visit with her primary care physician who was making  comments about her high anxiety and depressive scores.  Client reported her doctor bluntly asked her if she needs to take her medication for the sake of having a lower score.  Client reported she feels like she has overly explained herself to her primary care physician and she is not listening to her but is seeing things as is black-and-white.  Client reported through her insurance she was made aware that she can get a dietitian which she has been set up with at free of charge.  Client reported she has already had a meeting with the dietitian and she is looking forward to working on her weight and eating habits changes. Evidence of progress towards goal: Client reported 1 positive of keeping motivated on her goal of lifestyle changes as well as 1 positive of problem solving her stressors.  Suicidal/Homicidal: Nowithout intent/plan  Therapist Response:  Therapist began the appointment asking the client how she has been doing since last seen. Therapist engaged with active listening and positive emotional support. Therapist used CBT to address the client about recent events that had contributed to negative thoughts and emotions she been experiencing. Therapist used CBT to normalize the clients thoughts and feelings about stressors of her health, grief, and identifying her own goals and perspectives that keep her more positively motivated despite negative feedback. Therapist used CBT to continue teaching the client about keeping realistic small goals to accomplish. Therapist used CBT ask the client to identify her progress with frequency of use with coping skills with continued practice in her daily activity.    Therapist assigned client homework to practice self-care.   Plan: Return again in 3 weeks.  Diagnosis: mdd, recurrent, moderate with anxious distress  Collaboration of Care: Patient refused AEB none requested by the client.  Patient/Guardian was advised Release of Information must be  obtained prior to any record release in order to collaborate their care with an outside provider. Patient/Guardian was advised if they have not already done so to contact the registration department to sign all necessary forms in order for us  to release information regarding their care.   Consent: Patient/Guardian gives verbal consent for treatment and assignment of benefits for services provided during this visit. Patient/Guardian expressed understanding and agreed to proceed.   Jaimey Franchini Y Randy Castrejon, LCSW 03/11/2024

## 2024-03-14 DIAGNOSIS — I509 Heart failure, unspecified: Secondary | ICD-10-CM | POA: Diagnosis not present

## 2024-03-15 DIAGNOSIS — I509 Heart failure, unspecified: Secondary | ICD-10-CM | POA: Diagnosis not present

## 2024-03-16 DIAGNOSIS — I509 Heart failure, unspecified: Secondary | ICD-10-CM | POA: Diagnosis not present

## 2024-03-16 NOTE — Progress Notes (Signed)
 ADVANCED HEART FAILURE CLINIC NOTE  Referring Physician: Lawrance Presume, MD  Primary Care: Lawrance Presume, MD Primary Cardiologist: Dr. Lavonne Prairie  CC: Systolic heart failure  HPI: Stephanie Reilly is a 53 y.o. female with hypertension, hyperlipidemia, type 2 diabetes, obesity and systolic heart failure presenting today for follow up.  Her history dates back to 02/15/23 when she was admitted to [facility] 2/2 stroke from occlusion of the proximal right M2.  She was sent to interventional radiology and intubated.  Immediately postintubation she had PEA arrest ultimately undergoing diagnostic cerebral angiogram and thrombectomy for the right M2 occlusion now status post complete recanalization.   Her cardiac history dates back to 02/07/2023 when she presented to the emergency department with chest pain and shortness of breath that started in March 2024.  During that admission patient had an echocardiogram that demonstrated EF of 20% with global hypokinesis but preserved RV function.  She underwent left heart cath which was nonsignificant and right heart cath that demonstrated preserved cardiac index.  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  She was discharged home on Entresto , Farxiga , and spironolactone  and Toprol .  Interval hx:  - Since her last appointment she has been doing fairly well from a functional standpoint. Reports that she is now going to the gym 4-5x weekly with no functional limitations 2/2 SOB, CP, lightheadedness.  - No longer having issues with LE edema.   Activity level/exercise tolerance:  NYHA IIB Orthopnea:  Sleeps on 2 pillows Paroxysmal noctural dyspnea:  No Chest pain/pressure:  No Orthostatic lightheadedness:  No Palpitations:  No Lower extremity edema:  No Presyncope/syncope:  NO Cough:  No  Current Outpatient Medications  Medication Sig Dispense Refill   Accu-Chek Softclix Lancets lancets Check blood sugars 7  times daily. 100 each 12   apixaban  (ELIQUIS ) 5 MG TABS tablet Take 1 tablet (5 mg total) by mouth 2 (two) times daily. 180 tablet 3   Ascorbic Acid (VITAMIN C) 100 MG tablet Take 100 mg by mouth daily.     Biotin (BIOTIN MAXIMUM STRENGTH) 10 MG TABS Take 10 mg by mouth daily.     Blood Glucose Monitoring Suppl (ACCU-CHEK GUIDE) w/Device KIT Check blood sugars 7 times daily. 1 kit 0   carvedilol  (COREG ) 25 MG tablet Take 1 tablet (25 mg total) by mouth 2 (two) times daily with a meal. 180 tablet 3   cetirizine (ZYRTEC) 10 MG tablet Take 10 mg by mouth daily.     dapagliflozin  propanediol (FARXIGA ) 10 MG TABS tablet Take 1 tablet (10 mg total) by mouth daily. 90 tablet 3   diclofenac  Sodium (VOLTAREN ) 1 % GEL Apply 2 g topically 4 (four) times daily. TO neck for pain relief 200 g 0   fluticasone (FLONASE) 50 MCG/ACT nasal spray Place 1 spray into both nostrils daily.     glucose blood (ACCU-CHEK GUIDE TEST) test strip Check blood sugars 7 times daily. 100 each 12   polyethylene glycol powder (GLYCOLAX /MIRALAX ) 17 GM/SCOOP powder MIX 17 GRAMS WITH LIQUID AND DRINK EVERY DAY AS NEEDED 510 g 3   rosuvastatin  (CRESTOR ) 20 MG tablet Take 1 tablet (20 mg total) by mouth daily. 90 tablet 3   sacubitril -valsartan  (ENTRESTO ) 97-103 MG Take 1 tablet by mouth 2 (two) times daily. 180 tablet 3   senna-docusate (SENOKOT-S) 8.6-50 MG tablet Take 1 tablet by mouth at bedtime as needed for mild constipation. 30 tablet 0   simethicone (MYLICON) 80  MG chewable tablet Chew 80 mg by mouth daily.     spironolactone  (ALDACTONE ) 50 MG tablet Take 1 tablet (50 mg total) by mouth daily. 30 tablet 11   torsemide  (DEMADEX ) 20 MG tablet Take 1 tablet (20 mg total) by mouth daily as needed (for increased edema). 30 tablet 0   No current facility-administered medications for this visit.    PHYSICAL EXAM: There were no vitals filed for this visit. GENERAL: NAD Lungs- *** CARDIAC:  JVP: *** cm          Normal rate with  regular rhythm. *** murmur.  Pulses ***. *** edema.  ABDOMEN: Soft, non-tender, non-distended.  EXTREMITIES: Warm and well perfused.  NEUROLOGIC: No obvious FND   DATA REVIEW  ECG: 03/14/23: sinus tachycardia  As per my personal interpretation  ECHO: 02/16/23: LVEF<20%, RV mildly reduced.  06/04/23: LVEF 35%.  10/29/22: LVEF 30%-35%  CATH: 02/10/23 Normal coronary anatomy Mild LV filling pressures. PCWP 23/21 with mean 20 mm Hg. LVEDP 21 mm Hg Mild pulmonary HTN PAP 42/22 with mean 30 mm Hg Cardiac output 4.81 L/min with index 2.26.  CMR: 02/11/23 1. Severe LV dilatation, normal wall thickness, and severe systolic dysfunction (EF 12%)  2.  Normal RV size with moderate systolic dysfunction (EF 31%)  3. Subendocardial LGE in focal region of mid inferior wall. Subendocardial LGE is typically ischemic in etiology and this could represent a small infarct. However, sarcoidosis can also cause subendocardial LGE. Recommend cardiac FDG PET to evaluate for sarcoid.  4. RV insertion site LGE, which is a nonspecific scar pattern often seen in setting of elevated pulmonary pressures   ASSESSMENT & PLAN:  Heart Failure with reduced EF Etiology of NW:[id number] cardiomyopathy; cardiac PET ordered & pending.  NYHA class / AHA Stage:NYHA IIB Volume status & Diuretics: mildly hypervolemic; torsemide  20mg  PRN only. Take additional 20mg  today.  Vasodilators:Entresto  97/103mg  BID Beta-Blocker: increase coreg  to 25mg  BID MRA:spironolactone  25mg  daily Cardiometabolic:Farxiga  10mg  Devices therapies & Valvulopathies: Her LVEF remains reduced at 30-35% however she is doing very well functionally. She is very averse to medication changes and procedures. We will try to push GDMT with plan for repeat TTE in 2-75m. If LVEF remains low then plan to proceed with primary prevention ICD.  Advanced therapies:not currently indicated.   2. 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  - No residual deficits.    3. DMII - Hgb A1C 6.1  - On Farxiga   - She is now on Wegovy    4. NSVT /PVCs  - EP saw during recent admit, PVC burden not high enough to warrant amiodarone. - Repeat labs today - No ectopy on exam today.   5. Obesity  - There is no height or weight on file to calculate BMI. - discussed importance of continued exercise / weigh tloss.  I spent *** minutes caring for this patient today including face to face time, ordering and reviewing labs, reviewing records from ***, seeing the patient, documenting in the record, and arranging follow ups.    Lavert Matousek Advanced Heart Failure Mechanical Circulatory Support

## 2024-03-17 ENCOUNTER — Ambulatory Visit (HOSPITAL_COMMUNITY)
Admission: RE | Admit: 2024-03-17 | Discharge: 2024-03-17 | Disposition: A | Discharge status: Home / Self Care | Source: Ambulatory Visit | Attending: Cardiology | Admitting: Cardiology

## 2024-03-17 VITALS — BP 124/74 | HR 81 | Wt 260.6 lb

## 2024-03-17 DIAGNOSIS — Z79899 Other long term (current) drug therapy: Secondary | ICD-10-CM | POA: Diagnosis not present

## 2024-03-17 DIAGNOSIS — Z8673 Personal history of transient ischemic attack (TIA), and cerebral infarction without residual deficits: Secondary | ICD-10-CM | POA: Diagnosis not present

## 2024-03-17 DIAGNOSIS — I493 Ventricular premature depolarization: Secondary | ICD-10-CM | POA: Diagnosis not present

## 2024-03-17 DIAGNOSIS — Z6841 Body Mass Index (BMI) 40.0 and over, adult: Secondary | ICD-10-CM | POA: Insufficient documentation

## 2024-03-17 DIAGNOSIS — E669 Obesity, unspecified: Secondary | ICD-10-CM | POA: Insufficient documentation

## 2024-03-17 DIAGNOSIS — I5022 Chronic systolic (congestive) heart failure: Secondary | ICD-10-CM | POA: Diagnosis not present

## 2024-03-17 DIAGNOSIS — I472 Ventricular tachycardia, unspecified: Secondary | ICD-10-CM | POA: Diagnosis not present

## 2024-03-17 DIAGNOSIS — Z8679 Personal history of other diseases of the circulatory system: Secondary | ICD-10-CM | POA: Diagnosis not present

## 2024-03-17 DIAGNOSIS — E66813 Obesity, class 3: Secondary | ICD-10-CM

## 2024-03-17 DIAGNOSIS — I110 Hypertensive heart disease with heart failure: Secondary | ICD-10-CM | POA: Diagnosis not present

## 2024-03-17 DIAGNOSIS — I63411 Cerebral infarction due to embolism of right middle cerebral artery: Secondary | ICD-10-CM | POA: Diagnosis not present

## 2024-03-17 DIAGNOSIS — I5020 Unspecified systolic (congestive) heart failure: Secondary | ICD-10-CM | POA: Insufficient documentation

## 2024-03-17 DIAGNOSIS — Z006 Encounter for examination for normal comparison and control in clinical research program: Secondary | ICD-10-CM

## 2024-03-17 DIAGNOSIS — E119 Type 2 diabetes mellitus without complications: Secondary | ICD-10-CM | POA: Diagnosis not present

## 2024-03-17 DIAGNOSIS — I509 Heart failure, unspecified: Secondary | ICD-10-CM | POA: Diagnosis not present

## 2024-03-17 DIAGNOSIS — E785 Hyperlipidemia, unspecified: Secondary | ICD-10-CM | POA: Insufficient documentation

## 2024-03-17 DIAGNOSIS — Z7984 Long term (current) use of oral hypoglycemic drugs: Secondary | ICD-10-CM | POA: Insufficient documentation

## 2024-03-17 DIAGNOSIS — I252 Old myocardial infarction: Secondary | ICD-10-CM | POA: Insufficient documentation

## 2024-03-17 DIAGNOSIS — Z7689 Persons encountering health services in other specified circumstances: Secondary | ICD-10-CM | POA: Diagnosis not present

## 2024-03-17 LAB — BASIC METABOLIC PANEL WITH GFR
Anion gap: 5 (ref 5–15)
BUN: 14 mg/dL (ref 6–20)
CO2: 23 mmol/L (ref 22–32)
Calcium: 10.4 mg/dL — ABNORMAL HIGH (ref 8.9–10.3)
Chloride: 111 mmol/L (ref 98–111)
Creatinine, Ser: 0.79 mg/dL (ref 0.44–1.00)
GFR, Estimated: >60 mL/min (ref >60)
Glucose, Bld: 108 mg/dL — ABNORMAL HIGH (ref 70–99)
Potassium: 4.5 mmol/L (ref 3.5–5.1)
Sodium: 139 mmol/L (ref 135–145)

## 2024-03-17 LAB — ECHOCARDIOGRAM COMPLETE
Area-P 1/2: 4.04 cm2
Calc EF: 53.4 %
S' Lateral: 2.9 cm
Single Plane A2C EF: 59.1 %
Single Plane A4C EF: 47.7 %

## 2024-03-17 LAB — BRAIN NATRIURETIC PEPTIDE: B Natriuretic Peptide: 18.8 pg/mL (ref 0.0–100.0)

## 2024-03-17 NOTE — Patient Instructions (Signed)
 Medication Changes:  No Changes In Medications at this time.   Labs done today, your results will be available in MyChart, we will contact you for abnormal readings.  Follow-Up in: 3 months as scheduled with Dr. Bruce Caper   At the [facility], you and your health needs are our priority. We have a designated team specialized in the treatment of Heart Failure. This Care Team includes your primary Heart Failure Specialized Cardiologist (physician), Advanced Practice Providers (APPs- Physician Assistants and Nurse Practitioners), and Pharmacist who all work together to provide you with the care you need, when you need it.   You may see any of the following providers on your designated Care Team at your next follow up:  Dr. Jules Oar Dr. Peder Bourdon Dr. Alwin Baars Dr. Judyth Nunnery Nieves Bars, NP Ruddy Corral, [place] [facility] [place], [place] Dennise Fitz, NP [place] Lee, NP Luster Salters, PharmD   Please be sure to bring in all your medications bottles to every appointment.   Need to Contact Us :  If you have any questions or concerns before your next appointment please send us  a message through [place] or call our office at [phone].    TO LEAVE A MESSAGE FOR THE NURSE SELECT OPTION 2, PLEASE LEAVE A MESSAGE INCLUDING: YOUR NAME DATE OF BIRTH CALL BACK NUMBER REASON FOR CALL**this is important as we prioritize the call backs  YOU WILL RECEIVE A CALL BACK THE SAME DAY AS LONG AS YOU CALL BEFORE 4:00 PM

## 2024-03-17 NOTE — Research (Signed)
 SITE: [id number]     Subject # [id number]   Subprotocol: A  Inclusion Criteria  Patients who meet all of the following criteria are eligible for enrollment as study participants:  Yes No  Age > 55 years old X   Eligible to wear Holter Study X    Exclusion Criteria  Patients who meet any of these criteria are not eligible for enrollment as study participants: Yes No  1. Receiving any mechanical (respiratory or circulatory) or renal support therapy at Screening or during Visit #1.  X  2.  Any other conditions that in the opinion of the investigators are likely to prevent compliance with the study protocol or pose a safety concern if the subject participates in the study.  X  3. Poor tolerance, namely susceptible to severe skin allergies from ECG adhesive patch application.  X   Protocol: REV H    60 minute start window         Cor device must be applied, and the study initiated, no later than 60 minutes of completing the Echocardiogram                             HH:MM  Echo completion time  09:34  2.   Cor Study start time  09:56   30-Minute execution window  Once Cor Monitoring begins, 3 QT Med ECGs and the 15-minute rest period must be completed within a 30 minute window     HH:MM  3. QT Med ECG Completion time  09:59  4. Start of 15-Min sitting rest period  09:59  5. End of 15-Min rest period  10:14  6. Time of device removal  10:26   *Continue to use the Mobile App Event feature to log the Rest period windows and follow instructions on the EF-ACT Clinical Trial  Patient Instruction Card.  Describe any anomalies in Protocol execution in the Protocol Deviation Log    Residential Zip code [address] (First 3 digits ONLY)                                           [facility] Informed Consent   Subject Name: Stephanie Reilly  Subject met inclusion and exclusion criteria.  The informed consent form, study requirements and expectations were reviewed with the subject. Subject had opportunity to  read consent and questions and concerns were addressed prior to the signing of the consent form.  The subject verbalized understanding of the trial requirements.  The subject agreed to participate in the [facility] EF ACT trial and signed the informed consent at 09:49 on 17-Mar-2024.  The informed consent was obtained prior to performance of any protocol-specific procedures for the subject.  A copy of the signed informed consent was given to the subject and a copy was placed in the subject's medical record.   Stephanie Reilly          Current Outpatient Medications:    Accu-Chek Softclix Lancets lancets, Check blood sugars 7 times daily., Disp: 100 each, Rfl: 12   apixaban  (ELIQUIS ) 5 MG TABS tablet, Take 1 tablet (5 mg total) by mouth 2 (two) times daily., Disp: 180 tablet, Rfl: 3   Ascorbic Acid (VITAMIN C) 100 MG tablet, Take 100 mg by mouth daily., Disp: , Rfl:    Biotin (BIOTIN MAXIMUM STRENGTH) 10 MG TABS, Take 10  mg by mouth daily., Disp: , Rfl:    Blood Glucose Monitoring Suppl (ACCU-CHEK GUIDE) w/Device KIT, Check blood sugars 7 times daily., Disp: 1 kit, Rfl: 0   carvedilol  (COREG ) 25 MG tablet, Take 1 tablet (25 mg total) by mouth 2 (two) times daily with a meal., Disp: 180 tablet, Rfl: 3   cetirizine (ZYRTEC) 10 MG tablet, Take 10 mg by mouth daily., Disp: , Rfl:    dapagliflozin  propanediol (FARXIGA ) 10 MG TABS tablet, Take 1 tablet (10 mg total) by mouth daily., Disp: 90 tablet, Rfl: 3   diclofenac  Sodium (VOLTAREN ) 1 % GEL, Apply 2 g topically 4 (four) times daily. TO neck for pain relief (Patient taking differently: Apply 2 g topically 4 (four) times daily. TO neck for pain relief as needed), Disp: 200 g, Rfl: 0   fluticasone (FLONASE) 50 MCG/ACT nasal spray, Place 1 spray into both nostrils daily., Disp: , Rfl:    glucose blood (ACCU-CHEK GUIDE TEST) test strip, Check blood sugars 7 times daily., Disp: 100 each, Rfl: 12   polyethylene glycol powder (GLYCOLAX /MIRALAX ) 17 GM/SCOOP  powder, MIX 17 GRAMS WITH LIQUID AND DRINK EVERY DAY AS NEEDED, Disp: 510 g, Rfl: 3   rosuvastatin  (CRESTOR ) 20 MG tablet, Take 1 tablet (20 mg total) by mouth daily., Disp: 90 tablet, Rfl: 3   sacubitril -valsartan  (ENTRESTO ) 97-103 MG, Take 1 tablet by mouth 2 (two) times daily., Disp: 180 tablet, Rfl: 3   senna-docusate (SENOKOT-S) 8.6-50 MG tablet, Take 1 tablet by mouth at bedtime as needed for mild constipation., Disp: 30 tablet, Rfl: 0   spironolactone  (ALDACTONE ) 50 MG tablet, Take 1 tablet (50 mg total) by mouth daily., Disp: 30 tablet, Rfl: 11   torsemide  (DEMADEX ) 20 MG tablet, Take 1 tablet (20 mg total) by mouth daily as needed (for increased edema)., Disp: 30 tablet, Rfl: 0

## 2024-03-18 DIAGNOSIS — Z419 Encounter for procedure for purposes other than remedying health state, unspecified: Secondary | ICD-10-CM | POA: Diagnosis not present

## 2024-03-18 DIAGNOSIS — I509 Heart failure, unspecified: Secondary | ICD-10-CM | POA: Diagnosis not present

## 2024-03-19 DIAGNOSIS — I509 Heart failure, unspecified: Secondary | ICD-10-CM | POA: Diagnosis not present

## 2024-03-20 DIAGNOSIS — I509 Heart failure, unspecified: Secondary | ICD-10-CM | POA: Diagnosis not present

## 2024-03-21 DIAGNOSIS — I509 Heart failure, unspecified: Secondary | ICD-10-CM | POA: Diagnosis not present

## 2024-03-23 DIAGNOSIS — I509 Heart failure, unspecified: Secondary | ICD-10-CM | POA: Diagnosis not present

## 2024-03-24 DIAGNOSIS — I509 Heart failure, unspecified: Secondary | ICD-10-CM | POA: Diagnosis not present

## 2024-03-25 DIAGNOSIS — I509 Heart failure, unspecified: Secondary | ICD-10-CM | POA: Diagnosis not present

## 2024-03-26 DIAGNOSIS — I509 Heart failure, unspecified: Secondary | ICD-10-CM | POA: Diagnosis not present

## 2024-03-27 DIAGNOSIS — I509 Heart failure, unspecified: Secondary | ICD-10-CM | POA: Diagnosis not present

## 2024-03-28 DIAGNOSIS — I509 Heart failure, unspecified: Secondary | ICD-10-CM | POA: Diagnosis not present

## 2024-03-29 DIAGNOSIS — I509 Heart failure, unspecified: Secondary | ICD-10-CM | POA: Diagnosis not present

## 2024-03-30 DIAGNOSIS — I509 Heart failure, unspecified: Secondary | ICD-10-CM | POA: Diagnosis not present

## 2024-03-31 ENCOUNTER — Telehealth: Payer: Self-pay | Admitting: Internal Medicine

## 2024-03-31 DIAGNOSIS — I509 Heart failure, unspecified: Secondary | ICD-10-CM | POA: Diagnosis not present

## 2024-03-31 NOTE — Telephone Encounter (Signed)
 Copied from CRM [id number]. Topic: Clinical - Medication Question >> Mar 31, 2024 11:18 AM Stephanie Reilly wrote:  Reason for CRM: Pt currently using adult underwear and wants to know if she can get 80 pairs of underwear instead of 60 per month. States she having more bowel movements that are loose and when she passed gas she pooping from the daily fiber dosage, this is reason why she asking for more.

## 2024-04-01 DIAGNOSIS — I509 Heart failure, unspecified: Secondary | ICD-10-CM | POA: Diagnosis not present

## 2024-04-02 DIAGNOSIS — I509 Heart failure, unspecified: Secondary | ICD-10-CM | POA: Diagnosis not present

## 2024-04-02 NOTE — Telephone Encounter (Signed)
 She needs to call her supplier and request the increased in amount that she wants per month then they will send us  form to sign off on it.

## 2024-04-03 DIAGNOSIS — I509 Heart failure, unspecified: Secondary | ICD-10-CM | POA: Diagnosis not present

## 2024-04-04 DIAGNOSIS — I509 Heart failure, unspecified: Secondary | ICD-10-CM | POA: Diagnosis not present

## 2024-04-06 DIAGNOSIS — I509 Heart failure, unspecified: Secondary | ICD-10-CM | POA: Diagnosis not present

## 2024-04-07 ENCOUNTER — Telehealth: Payer: Self-pay | Admitting: Internal Medicine

## 2024-04-07 DIAGNOSIS — I509 Heart failure, unspecified: Secondary | ICD-10-CM | POA: Diagnosis not present

## 2024-04-07 NOTE — Telephone Encounter (Signed)
 Copied from CRM [id number]. Topic: General - Other >> Apr 06, 2024  3:58 PM Santiya F wrote:  Reason for CRM: Patient is calling in because [facility] [employer] faxed over a form regarding patient's incontinence supplies and she needs for Dr. Vicci to okay it and fax it back. Patient says the supplies are supposed to ship out on 04/08/24, and she needs this faxed back immediately. Preferably before the end of the day or tomorrow morning.

## 2024-04-07 NOTE — Telephone Encounter (Signed)
 Called & spoke to the patient. Verified name & DOB. She stated that [facility] [employer] should have sent a fax this week in regard to the increase of her incontinence supplies. She would like to also ask for an order of wipes. She stated that currently her skin is feeling raw and would like wipes to help with the sensitivity. Please advise if you received the form.   Update: PCP received forms and signed. Awaiting to receive forms back to fax to [facility] [employer].

## 2024-04-07 NOTE — Telephone Encounter (Signed)
 Received and will be left on your desk tomorrow.

## 2024-04-07 NOTE — Telephone Encounter (Signed)
 Called & spoke to the patient. Verified name & DOB. She stated that [facility] should have sent a fax this week in regard to the increase of her incontinence supplies. She would like to also ask for an order of wipes. She stated that currently her skin is feeling raw and would like wipes to help with the sensitivity. Please advise if you received the form.

## 2024-04-08 DIAGNOSIS — I5021 Acute systolic (congestive) heart failure: Secondary | ICD-10-CM | POA: Diagnosis not present

## 2024-04-08 DIAGNOSIS — R152 Fecal urgency: Secondary | ICD-10-CM | POA: Diagnosis not present

## 2024-04-08 DIAGNOSIS — I509 Heart failure, unspecified: Secondary | ICD-10-CM | POA: Diagnosis not present

## 2024-04-08 DIAGNOSIS — N3946 Mixed incontinence: Secondary | ICD-10-CM | POA: Diagnosis not present

## 2024-04-08 DIAGNOSIS — R159 Full incontinence of feces: Secondary | ICD-10-CM | POA: Diagnosis not present

## 2024-04-08 NOTE — Telephone Encounter (Signed)
 Form with incontinence and wipes order was successfully faxed to [facility] on 04/08/2024. Fax #:[phone].

## 2024-04-08 NOTE — Telephone Encounter (Signed)
 Form with incontinence and wipes order was successfully faxed to [facility] on 04/08/2024. Fax #:[phone].

## 2024-04-09 DIAGNOSIS — I509 Heart failure, unspecified: Secondary | ICD-10-CM | POA: Diagnosis not present

## 2024-04-10 DIAGNOSIS — I509 Heart failure, unspecified: Secondary | ICD-10-CM | POA: Diagnosis not present

## 2024-04-11 DIAGNOSIS — I509 Heart failure, unspecified: Secondary | ICD-10-CM | POA: Diagnosis not present

## 2024-04-12 DIAGNOSIS — I509 Heart failure, unspecified: Secondary | ICD-10-CM | POA: Diagnosis not present

## 2024-04-13 DIAGNOSIS — I509 Heart failure, unspecified: Secondary | ICD-10-CM | POA: Diagnosis not present

## 2024-04-16 DIAGNOSIS — I509 Heart failure, unspecified: Secondary | ICD-10-CM | POA: Diagnosis not present

## 2024-04-17 DIAGNOSIS — I509 Heart failure, unspecified: Secondary | ICD-10-CM | POA: Diagnosis not present

## 2024-04-18 DIAGNOSIS — I509 Heart failure, unspecified: Secondary | ICD-10-CM | POA: Diagnosis not present

## 2024-04-19 DIAGNOSIS — I509 Heart failure, unspecified: Secondary | ICD-10-CM | POA: Diagnosis not present

## 2024-04-20 ENCOUNTER — Telehealth: Payer: Self-pay | Admitting: Internal Medicine

## 2024-04-20 DIAGNOSIS — I509 Heart failure, unspecified: Secondary | ICD-10-CM | POA: Diagnosis not present

## 2024-04-20 NOTE — Telephone Encounter (Signed)
 Copied from CRM [id number]. Topic: General - Other >> Apr 20, 2024  8:55 AM Marissa P wrote:  Reason for CRM: Patient called stating abscess between breast, needing to send in prescription for this please

## 2024-04-21 ENCOUNTER — Ambulatory Visit (INDEPENDENT_AMBULATORY_CARE_PROVIDER_SITE_OTHER): Admitting: Clinical

## 2024-04-21 DIAGNOSIS — F331 Major depressive disorder, recurrent, moderate: Secondary | ICD-10-CM

## 2024-04-21 DIAGNOSIS — I509 Heart failure, unspecified: Secondary | ICD-10-CM | POA: Diagnosis not present

## 2024-04-21 MED ORDER — SULFAMETHOXAZOLE-TRIMETHOPRIM 800-160 MG PO TABS
1.0000 | ORAL_TABLET | Freq: Two times a day (BID) | ORAL | 0 refills | Status: DC
Start: 2024-04-21 — End: 2024-05-10

## 2024-04-21 NOTE — Progress Notes (Signed)
 THERAPIST PROGRESS NOTE Virtual Visit via Video Note  I connected with Stephanie Reilly on 04/21/2024 at 11:00 AM EDT by a video enabled telemedicine application and verified that I am speaking with the correct person using two identifiers.  Location: Patient: home Provider: office   I discussed the limitations of evaluation and management by telemedicine and the availability of in person appointments. The patient expressed understanding and agreed to proceed.   Follow Up Instructions: I discussed the assessment and treatment plan with the patient. The patient was provided an opportunity to ask questions and all were answered. The patient agreed with the plan and demonstrated an understanding of the instructions.   The patient was advised to call back or seek an in-person evaluation if the symptoms worsen or if the condition fails to improve as anticipated.   Session Time: 55 minutes  Participation Level: Active  Behavioral Response: CasualAlertAnxious and Euthymic  Type of Therapy: Individual Therapy  Treatment Goals addressed: Stephanie Reilly will score less than 12 on the Patient Health Questionnaire (PHQ-9)    ProgressTowards Goals: Progressing  Interventions: CBT and Supportive  Summary:  Stephanie Reilly is a 53 y.o. female who presents with a scheduled appointment oriented x 5, appropriately dressed, and friendly.  Client denied hallucinations and delusions. Client reported on today things have been holding steady.  Client reported since she was last seen she saw her cardiologist.  Client reported her heart rate is the same sized and it is beating well.  Client reported her doctor said it is working in better condition than before she got sick.  Client reported also since the last appointment a cousin, her cousin's uncle, and her neighbors father passed away.  Client reported with the death coming in 3's of people that are close to her.  Client reported her cousin's funeral will be  next Friday.  Client reported she seems to be dealing with the grief fairly okay as she did not quickly go into a depressive state.  Client reported she is connected with her nutrition coach through her [facility] Medicare plan.  Client reported she is making lifestyle changes including eating and her physical activity with any means to help her towards efforts of losing weight. Evidence of progress towards goal: Client reported she is able to 1 better managed her depressive and or irritability symptoms by keeping herself occupied with activities within physical means such as journaling and modified exercises she can do from the bed or in a chair.  Suicidal/Homicidal: Nowithout intent/plan  Therapist Response:  Therapist began the appointment asking the client how she has been doing since last seen. Therapist engaged with active listening and positive emotional support. Therapist used CBT to engage the client to give her time to discuss her thoughts and feelings about her family, her health, and her own small changes she is making to improve. Therapist used CBT to teach client about mood regulation. Therapist used CBT to positively reinforce the clients initiated to intervene with her depressive symptoms using appropriate behavioral activation activities. Therapist used CBT ask the client to identify her progress with frequency of use with coping skills with continued practice in her daily activity.    Therapist client to continue self-care activities.   Plan: Return again in 4 weeks.  Diagnosis: mdd, recurrent episode, moderate with anxious distress  Collaboration of Care: Patient refused AEB none requested by the client.  Patient/Guardian was advised Release of Information must be obtained prior to any record release in order to  collaborate their care with an outside provider. Patient/Guardian was advised if they have not already done so to contact the registration department to sign all  necessary forms in order for us  to release information regarding their care.   Consent: Patient/Guardian gives verbal consent for treatment and assignment of benefits for services provided during this visit. Patient/Guardian expressed understanding and agreed to proceed.   Kimmarie Pascale Y Elmo Shumard, LCSW 04/21/2024

## 2024-04-21 NOTE — Addendum Note (Signed)
 Addended by: VICCI SOBER B on: 04/21/2024 01:08 PM   Modules accepted: Orders

## 2024-04-21 NOTE — Telephone Encounter (Signed)
 Prescription sent to her pharmacy for antibiotic called Bactrim . May need to be seen in ER to have abscess incised and drain if not better with antibiotic.

## 2024-04-21 NOTE — Telephone Encounter (Signed)
 Called & spoke to the patient. Verified name & DOB. Informed of bactrim  prescription sent to the pharmacy. Informed that she may need to be seen in ER to have abscess incised and drained if not better with antibiotic. Patient expressed verbal understanding of all discussed.

## 2024-04-22 ENCOUNTER — Ambulatory Visit: Admitting: Cardiology

## 2024-04-22 DIAGNOSIS — I509 Heart failure, unspecified: Secondary | ICD-10-CM | POA: Diagnosis not present

## 2024-04-23 DIAGNOSIS — I509 Heart failure, unspecified: Secondary | ICD-10-CM | POA: Diagnosis not present

## 2024-04-24 DIAGNOSIS — I509 Heart failure, unspecified: Secondary | ICD-10-CM | POA: Diagnosis not present

## 2024-04-25 DIAGNOSIS — I509 Heart failure, unspecified: Secondary | ICD-10-CM | POA: Diagnosis not present

## 2024-04-26 DIAGNOSIS — I509 Heart failure, unspecified: Secondary | ICD-10-CM | POA: Diagnosis not present

## 2024-04-27 DIAGNOSIS — I509 Heart failure, unspecified: Secondary | ICD-10-CM | POA: Diagnosis not present

## 2024-04-28 DIAGNOSIS — I509 Heart failure, unspecified: Secondary | ICD-10-CM | POA: Diagnosis not present

## 2024-04-29 DIAGNOSIS — I509 Heart failure, unspecified: Secondary | ICD-10-CM | POA: Diagnosis not present

## 2024-04-30 DIAGNOSIS — I509 Heart failure, unspecified: Secondary | ICD-10-CM | POA: Diagnosis not present

## 2024-05-01 DIAGNOSIS — I509 Heart failure, unspecified: Secondary | ICD-10-CM | POA: Diagnosis not present

## 2024-05-02 DIAGNOSIS — I509 Heart failure, unspecified: Secondary | ICD-10-CM | POA: Diagnosis not present

## 2024-05-03 DIAGNOSIS — I509 Heart failure, unspecified: Secondary | ICD-10-CM | POA: Diagnosis not present

## 2024-05-04 ENCOUNTER — Institutional Professional Consult (permissible substitution) (INDEPENDENT_AMBULATORY_CARE_PROVIDER_SITE_OTHER): Admitting: Adult Health

## 2024-05-04 ENCOUNTER — Telehealth: Payer: Self-pay

## 2024-05-04 DIAGNOSIS — I509 Heart failure, unspecified: Secondary | ICD-10-CM | POA: Diagnosis not present

## 2024-05-04 NOTE — Telephone Encounter (Signed)
 Copied from CRM [id number]. Topic: General - Other >> May 03, 2024  3:24 PM Tiffany S wrote: Reason for CRM: Patient is asking if she needs to get a pneumonia vaccine hep B vaccine this year. She also wants to check the status of wipes

## 2024-05-05 ENCOUNTER — Ambulatory Visit (INDEPENDENT_AMBULATORY_CARE_PROVIDER_SITE_OTHER): Admitting: Clinical

## 2024-05-05 DIAGNOSIS — F331 Major depressive disorder, recurrent, moderate: Secondary | ICD-10-CM | POA: Diagnosis not present

## 2024-05-05 DIAGNOSIS — I509 Heart failure, unspecified: Secondary | ICD-10-CM | POA: Diagnosis not present

## 2024-05-05 NOTE — Progress Notes (Signed)
 THERAPIST PROGRESS NOTE Virtual Visit via Video Note  I connected with Stephanie Reilly on 05/05/24 at  1:00 PM EDT by a video enabled telemedicine application and verified that I am speaking with the correct person using two identifiers.  Location: Patient: home Provider: office   I discussed the limitations of evaluation and management by telemedicine and the availability of in person appointments. The patient expressed understanding and agreed to proceed.   Follow Up Instructions: I discussed the assessment and treatment plan with the patient. The patient was provided an opportunity to ask questions and all were answered. The patient agreed with the plan and demonstrated an understanding of the instructions.   The patient was advised to call back or seek an in-person evaluation if the symptoms worsen or if the condition fails to improve as anticipated.   Session Time: 50 min  Participation Level: Active  Behavioral Response: CasualAlertAnxious  Type of Therapy: Individual Therapy  Treatment Goals addressed: Sharlet Lango will score less than 12 on the Patient Health Questionnaire (PHQ-9)   ProgressTowards Goals: Progressing  Interventions: CBT and Supportive  Summary:  Stephanie Reilly is a 53 y.o. female who presents with a scheduled appointment oriented x 5, appropriately dressed, and friendly.  Client denied hallucinations and delusions. Client reported on today she has been doing fairly okay.  Client reported however since the last time she was seen a cousin on her father side of the family passed away.  Client reported they did have a funeral service for the cousin who passed on her mother side and that went well.  Client reported this time of the year is difficult for her between May, June, July for the anniversaries of cousins and her aunts.  Client reported when she thinks about it she was said a few tears but then she gets to the point of knowing that they are in a better  place and she will talk to God about it.  Client reported she has made a lot of progress with managing her irritability.  Client reported last year she went through a lot with her health and that was a turning point for her.  Client reported she still has certain triggers but she does not react as she wants stated.  Client reported she keeps herself busy with activities and staying engaged with her family.  Client reported she is doing well with making certain lifestyle changes for what she is eating to help aid in improving her health. Evidence of progress towards goal: Client reported 2 positives of being able to identify the stage of grief that she is currently working through.  Client reported she also knows how to keep herself tactilely engaged with supports in activities within her physical limitations to help alleviate ruminating on negative thoughts.  Suicidal/Homicidal: Nowithout intent/plan  Therapist Response:  Therapist began the appointment asking the client how she has been doing since last seen. Therapist engaged using active listening and positive emotional support. Therapist used CBT to engage and asked the client about changes occurred since she is last seen. Therapist used CBT to engage the client as she discusses how recent events relate to her overall history of attempting to process grief and daily life. Therapist used CBT to teach client about the grief cycle as well as maintaining emotional regulation. Therapist used CBT ask the client to identify her progress with frequency of use with coping skills with continued practice in her daily activity.    Therapist assigned the client homework  to practice self-care.   Plan: Return again in 4 weeks.  Diagnosis: mdd, recurrent episode, moderate with anxious distress  Collaboration of Care: Patient refused AEB none requested by the client.  Patient/Guardian was advised Release of Information must be obtained prior to any record  release in order to collaborate their care with an outside provider. Patient/Guardian was advised if they have not already done so to contact the registration department to sign all necessary forms in order for us  to release information regarding their care.   Consent: Patient/Guardian gives verbal consent for treatment and assignment of benefits for services provided during this visit. Patient/Guardian expressed understanding and agreed to proceed.   Phylisha Dix Y Lanesha Azzaro, LCSW 05/05/2024

## 2024-05-06 DIAGNOSIS — I509 Heart failure, unspecified: Secondary | ICD-10-CM | POA: Diagnosis not present

## 2024-05-06 NOTE — Telephone Encounter (Signed)
 Called & spoke to the patient. Verified name & DOB. Informed that she currently does not need the pneumonia vax. Informed that she is due for Hep B vaccine. No record in EPIC of [facility] of receiving it. Patient will go to pharmacy to get Hep B and flu shot on Friday 05/07/24. Informed patient that we have not received a fax from [facility] for the wipes. Patient will call them and have it re-faxed. Awaiting fax.

## 2024-05-07 DIAGNOSIS — I509 Heart failure, unspecified: Secondary | ICD-10-CM | POA: Diagnosis not present

## 2024-05-08 DIAGNOSIS — I509 Heart failure, unspecified: Secondary | ICD-10-CM | POA: Diagnosis not present

## 2024-05-09 DIAGNOSIS — I509 Heart failure, unspecified: Secondary | ICD-10-CM | POA: Diagnosis not present

## 2024-05-09 DIAGNOSIS — I5022 Chronic systolic (congestive) heart failure: Secondary | ICD-10-CM | POA: Insufficient documentation

## 2024-05-09 NOTE — Progress Notes (Unsigned)
 Cardiology Office Note:   Date:  05/10/2024  ID:  Stephanie Reilly, DOB [birth year], MRN [id number] PCP: Vicci Barnie NOVAK, MD  [facility] Providers Cardiologist:  Lynwood Schilling, MD Electrophysiologist:  Will Gladis Norton, MD {  History of Present Illness:   Stephanie Reilly is a 53 y.o. female female with hypertension, hyperlipidemia, type 2 diabetes, obesity and recently diagnosed systolic heart failure.  She was in the hospital with stroke and occlusion of the proximal right M2.  She had PEA arrest and was intubated.    EF was 20%.  Left heart cath demonstrated normal coronaries.    She was treated with Eliquis .  She wore a life vest. .  She had an MRI and I reviewed these results.  The ejection fraction was said to be 12%.  The there was a small mid inferior wall subendocardial infarct possible and they could not exclude sarcoid.  A PET scan is ordered.  This was not done. Echo in May demonstrated an EF of less than 20%.    MRI May 2024 demonstrated  subendocardial LGE in a focal region of the mid inferior wall.  Sarcoid could not be excluded.  FDG PET has been ordered but was not done because of insurance problems.  The EF was 30 - 35% on echo in August, 2024.  Most recently the EF thankfully in June was 50 to 55%.  She did have abnormal strain.   Since she was seen she has done well.  She does some exercise to include some walking, working with 15 pound weights, squats, chair exercises, yoga.  The patient denies any new symptoms such as chest discomfort, neck or arm discomfort. There has been no new shortness of breath, PND or orthopnea. There have been no reported palpitations, presyncope or syncope.   ROS: As stated in the HPI and negative for all other systems.  Studies Reviewed:    EKG:   EKG Interpretation Date/Time:  Monday May 10 2024 08:47:16 EDT Ventricular Rate:  85 PR Interval:  220 QRS Duration:  82 QT Interval:  372 QTC Calculation: 442 R Axis:   27  Text  Interpretation: Sinus rhythm with 1st degree A-V block When compared with ECG of 28-Feb-2023 09:37, Premature ventricular complexes are no longer Present Confirmed by Schilling Lynwood ([id number]) on 05/10/2024 8:52:16 AM    Risk Assessment/Calculations:           Physical Exam:   VS:  BP 125/83   Pulse 85   Ht 5' 2 (1.575 m)   Wt 273 lb 9.6 oz (124.1 kg)   SpO2 97%   BMI 50.04 kg/m    Wt Readings from Last 3 Encounters:  05/10/24 273 lb 9.6 oz (124.1 kg)  03/17/24 260 lb 9.6 oz (118.2 kg)  02/24/24 254 lb (115.2 kg)     GEN: Well nourished, well developed in no acute distress NECK: No JVD; No carotid bruits CARDIAC: RRR, no murmurs, rubs, gallops RESPIRATORY:  Clear to auscultation without rales, wheezing or rhonchi  ABDOMEN: Soft, non-tender, non-distended EXTREMITIES:  No edema; No deformity   ASSESSMENT AND PLAN:   Heart Failure with reduced EF:    Her ejection fraction is now much improved.  She is on optimal medical therapy.  No change other than as below.   Right M2 stroke:    I went back through her previous hospitalization records including her neurology notes.  She was thought to have had embolic stroke related to her reduced ejection fraction which  is now improved.  There was no other indication for DOAC.  She can stop the Eliquis .  She will go on an aspirin  81 mg daily.   DMII: She had lost weight.  She could not however tolerate SGLT2 inhibitor.  She is working with her primary care doctor.  Unfortunately her weight is back up.  I will defer management of her diabetes to Vicci Barnie NOVAK, MD   NSVT /PVCs :   She is not having any symptoms related to this.  No change in therapy.    Secondary hypercoaguable state:   As above she can stop the Eliquis .     Follow up with me in 4 months  Signed, Lynwood Schilling, MD

## 2024-05-10 ENCOUNTER — Ambulatory Visit: Attending: Cardiology | Admitting: Cardiology

## 2024-05-10 ENCOUNTER — Encounter: Payer: Self-pay | Admitting: Cardiology

## 2024-05-10 VITALS — BP 125/83 | HR 85 | Ht 62.0 in | Wt 273.6 lb

## 2024-05-10 DIAGNOSIS — E118 Type 2 diabetes mellitus with unspecified complications: Secondary | ICD-10-CM | POA: Insufficient documentation

## 2024-05-10 DIAGNOSIS — I4729 Other ventricular tachycardia: Secondary | ICD-10-CM | POA: Insufficient documentation

## 2024-05-10 DIAGNOSIS — I5022 Chronic systolic (congestive) heart failure: Secondary | ICD-10-CM | POA: Diagnosis not present

## 2024-05-10 DIAGNOSIS — I509 Heart failure, unspecified: Secondary | ICD-10-CM | POA: Diagnosis not present

## 2024-05-10 MED ORDER — ASPIRIN 81 MG PO TBEC: 81.0000 mg | DELAYED_RELEASE_TABLET | Freq: Every day | ORAL | Status: AC

## 2024-05-10 NOTE — Patient Instructions (Signed)
 Medication Instructions:  Stop Eliquis  Start Aspirin  81 mg once daily *If you need a refill on your cardiac medications before your next appointment, please call your pharmacy*  Lab Work: NONE If you have labs (blood work) drawn today and your tests are completely normal, you will receive your results only by: MyChart Message (if you have MyChart) OR A paper copy in the mail If you have any lab test that is abnormal or we need to change your treatment, we will call you to review the results.  Testing/Procedures: NONE  Follow-Up: At [facility], you and your health needs are our priority.  As part of our continuing mission to provide you with exceptional heart care, our providers are all part of one team.  This team includes your primary Cardiologist (physician) and Advanced Practice Providers or APPs (Physician Assistants and Nurse Practitioners) who all work together to provide you with the care you need, when you need it.  Your next appointment:   4 month(s)  Provider:   Lynwood Schilling, MD    We recommend signing up for the patient portal called MyChart.  Sign up information is provided on this After Visit Summary.  MyChart is used to connect with patients for Virtual Visits (Telemedicine).  Patients are able to view lab/test results, encounter notes, upcoming appointments, etc.  Non-urgent messages can be sent to your provider as well.   To learn more about what you can do with MyChart, go to [email or web address].

## 2024-05-11 DIAGNOSIS — I509 Heart failure, unspecified: Secondary | ICD-10-CM | POA: Diagnosis not present

## 2024-05-12 DIAGNOSIS — I509 Heart failure, unspecified: Secondary | ICD-10-CM | POA: Diagnosis not present

## 2024-05-13 DIAGNOSIS — I509 Heart failure, unspecified: Secondary | ICD-10-CM | POA: Diagnosis not present

## 2024-05-14 DIAGNOSIS — N3946 Mixed incontinence: Secondary | ICD-10-CM | POA: Diagnosis not present

## 2024-05-14 DIAGNOSIS — R152 Fecal urgency: Secondary | ICD-10-CM | POA: Diagnosis not present

## 2024-05-14 DIAGNOSIS — R159 Full incontinence of feces: Secondary | ICD-10-CM | POA: Diagnosis not present

## 2024-05-14 DIAGNOSIS — I509 Heart failure, unspecified: Secondary | ICD-10-CM | POA: Diagnosis not present

## 2024-05-14 DIAGNOSIS — I5021 Acute systolic (congestive) heart failure: Secondary | ICD-10-CM | POA: Diagnosis not present

## 2024-05-15 DIAGNOSIS — I509 Heart failure, unspecified: Secondary | ICD-10-CM | POA: Diagnosis not present

## 2024-05-16 DIAGNOSIS — I509 Heart failure, unspecified: Secondary | ICD-10-CM | POA: Diagnosis not present

## 2024-05-17 DIAGNOSIS — I509 Heart failure, unspecified: Secondary | ICD-10-CM | POA: Diagnosis not present

## 2024-05-18 ENCOUNTER — Other Ambulatory Visit (HOSPITAL_COMMUNITY): Payer: Self-pay | Admitting: Cardiology

## 2024-05-18 DIAGNOSIS — I509 Heart failure, unspecified: Secondary | ICD-10-CM | POA: Diagnosis not present

## 2024-05-18 MED ORDER — SPIRONOLACTONE 50 MG PO TABS: 50.0000 mg | ORAL_TABLET | Freq: Every day | ORAL | 11 refills | Status: AC

## 2024-05-18 MED ORDER — TORSEMIDE 20 MG PO TABS: 20.0000 mg | ORAL_TABLET | Freq: Every day | ORAL | 11 refills | Status: AC | PRN

## 2024-05-19 DIAGNOSIS — I509 Heart failure, unspecified: Secondary | ICD-10-CM | POA: Diagnosis not present

## 2024-05-20 ENCOUNTER — Ambulatory Visit (INDEPENDENT_AMBULATORY_CARE_PROVIDER_SITE_OTHER): Admitting: Clinical

## 2024-05-20 DIAGNOSIS — I509 Heart failure, unspecified: Secondary | ICD-10-CM | POA: Diagnosis not present

## 2024-05-20 DIAGNOSIS — F331 Major depressive disorder, recurrent, moderate: Secondary | ICD-10-CM | POA: Diagnosis not present

## 2024-05-20 NOTE — Progress Notes (Signed)
   THERAPIST PROGRESS NOTE Virtual Visit via Video Note  I connected with Stephanie Reilly on 05/20/2024 at 10:00 AM EDT by a video enabled telemedicine application and verified that I am speaking with the correct person using two identifiers.  Location: Patient: home Provider: office   I discussed the limitations of evaluation and management by telemedicine and the availability of in person appointments. The patient expressed understanding and agreed to proceed.   Follow Up Instructions:  I discussed the assessment and treatment plan with the patient. The patient was provided an opportunity to ask questions and all were answered. The patient agreed with the plan and demonstrated an understanding of the instructions.   The patient was advised to call back or seek an in-person evaluation if the symptoms worsen or if the condition fails to improve as anticipated.   Session Time: 25 minutes  Participation Level: Active  Behavioral Response: CasualAlertEuthymic  Type of Therapy: Individual Therapy  Treatment Goals addressed: client will engage in at least 80% of scheduled individua psychotherapy sessions  ProgressTowards Goals: Progressing  Interventions: CBT  Summary:  Stephanie Reilly is a 53 y.o. female who presents for the scheduled appointment oriented x 5, appropriately dressed, and friendly.  Client denied hallucinations and delusions. Client reported on today she is doing well.  Client reported since she was last seen she has been sick.  Client reported she contracted a stomach virus but is now doing better.  Client reported she also saw her cardiologist and decided to take her off of the Eliquis  medication but she will be taking a aspirin .  Client reported with the weather being rainy most of the time it has caused her some back pain. Client reported it gets better when it stops. Client reported she has been handling grief kay. Client reported this is the month when her cousin who  was like a brother passed away 4 years ago. Client reported she cries but not as she feels like her body wants her to. Evidence of progress towards goal:  client reported 1 positive of emotionally showing her emotions related to grief appropriately.  Suicidal/Homicidal: Nowithout intent/plan  Therapist Response:  Therapist began the appointment asking her how she has been doing. Therapist engaged with active listening and positive emotional support. Therapist used cbt to engage and give her time to discuss her thoughts. Therapist used cbt to encourage her to continue practicing her self care regimen. Therapist used CBT ask the client to identify her progress with frequency of use with coping skills with continued practice in her daily activity.    Therapist assigned her homework to practice self care.   Plan: Return again in 4 weeks.  Diagnosis: mdd, recurrent episode, moderate  Collaboration of Care: Patient refused AEB none requested by the client.  Patient/Guardian was advised Release of Information must be obtained prior to any record release in order to collaborate their care with an outside provider. Patient/Guardian was advised if they have not already done so to contact the registration department to sign all necessary forms in order for us  to release information regarding their care.   Consent: Patient/Guardian gives verbal consent for treatment and assignment of benefits for services provided during this visit. Patient/Guardian expressed understanding and agreed to proceed.   Lanae Federer Y Aerilyn Slee, LCSW 05/20/2024

## 2024-05-20 NOTE — Telephone Encounter (Signed)
 SABRA

## 2024-05-21 DIAGNOSIS — I509 Heart failure, unspecified: Secondary | ICD-10-CM | POA: Diagnosis not present

## 2024-05-22 DIAGNOSIS — I509 Heart failure, unspecified: Secondary | ICD-10-CM | POA: Diagnosis not present

## 2024-05-23 DIAGNOSIS — I509 Heart failure, unspecified: Secondary | ICD-10-CM | POA: Diagnosis not present

## 2024-05-24 DIAGNOSIS — I509 Heart failure, unspecified: Secondary | ICD-10-CM | POA: Diagnosis not present

## 2024-05-26 DIAGNOSIS — I509 Heart failure, unspecified: Secondary | ICD-10-CM | POA: Diagnosis not present

## 2024-05-27 DIAGNOSIS — I509 Heart failure, unspecified: Secondary | ICD-10-CM | POA: Diagnosis not present

## 2024-05-28 DIAGNOSIS — I509 Heart failure, unspecified: Secondary | ICD-10-CM | POA: Diagnosis not present

## 2024-05-29 DIAGNOSIS — I509 Heart failure, unspecified: Secondary | ICD-10-CM | POA: Diagnosis not present

## 2024-05-30 DIAGNOSIS — I509 Heart failure, unspecified: Secondary | ICD-10-CM | POA: Diagnosis not present

## 2024-05-31 DIAGNOSIS — I509 Heart failure, unspecified: Secondary | ICD-10-CM | POA: Diagnosis not present

## 2024-06-01 DIAGNOSIS — I509 Heart failure, unspecified: Secondary | ICD-10-CM | POA: Diagnosis not present

## 2024-06-02 DIAGNOSIS — I509 Heart failure, unspecified: Secondary | ICD-10-CM | POA: Diagnosis not present

## 2024-06-03 DIAGNOSIS — I509 Heart failure, unspecified: Secondary | ICD-10-CM | POA: Diagnosis not present

## 2024-06-04 DIAGNOSIS — I509 Heart failure, unspecified: Secondary | ICD-10-CM | POA: Diagnosis not present

## 2024-06-05 DIAGNOSIS — I509 Heart failure, unspecified: Secondary | ICD-10-CM | POA: Diagnosis not present

## 2024-06-06 DIAGNOSIS — I509 Heart failure, unspecified: Secondary | ICD-10-CM | POA: Diagnosis not present

## 2024-06-07 DIAGNOSIS — I509 Heart failure, unspecified: Secondary | ICD-10-CM | POA: Diagnosis not present

## 2024-06-08 ENCOUNTER — Encounter (HOSPITAL_COMMUNITY): Admitting: Cardiology

## 2024-06-08 DIAGNOSIS — I509 Heart failure, unspecified: Secondary | ICD-10-CM | POA: Diagnosis not present

## 2024-06-09 ENCOUNTER — Ambulatory Visit (INDEPENDENT_AMBULATORY_CARE_PROVIDER_SITE_OTHER): Admitting: Clinical

## 2024-06-09 DIAGNOSIS — F331 Major depressive disorder, recurrent, moderate: Secondary | ICD-10-CM | POA: Diagnosis not present

## 2024-06-09 NOTE — Progress Notes (Signed)
 THERAPIST PROGRESS NOTE Virtual Visit via Video Note  I connected with Stephanie Reilly on 06/09/24 at  1:00 PM EDT by a video enabled telemedicine application and verified that I am speaking with the correct person using two identifiers.  Location: Patient: home Provider: office   I discussed the limitations of evaluation and management by telemedicine and the availability of in person appointments. The patient expressed understanding and agreed to proceed.   Follow Up Instructions: I discussed the assessment and treatment plan with the patient. The patient was provided an opportunity to ask questions and all were answered. The patient agreed with the plan and demonstrated an understanding of the instructions.   The patient was advised to call back or seek an in-person evaluation if the symptoms worsen or if the condition fails to improve as anticipated.   Session Time: 45 min  Participation Level: Active  Behavioral Response: CasualAlertEuthymic  Type of Therapy: Individual Therapy  Treatment Goals addressed: client will participate ina t least 80% of scheduled individual psychotherapy sessions  ProgressTowards Goals: Progressing  Interventions: CBT  Summary:  Stephanie Reilly is a 53 y.o. female who presents for the scheduled appointment oriented x 5, appropriately dressed, and friendly.  Client denied hallucinations and delusions. Client reported on today things been going well.  Client reported there was a death in her family that her nieces grandmother passed away.  Client reported they were close.  Client reported however she has been sending their regards to her nieces family.  Client reported she has been continuing to do her activities to help manage anxiety and depressive symptoms.  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  Client reported since she has been meeting with her  dietitian she has gone from the 270s now down to the 250s.  Client reported her medical doctor is very happy with her progress.  Client reported the most recent changes to her ex-boyfriend reaching back out to her after not having contact with him for 2 years.  Client reported things been necessarily in bad with him but she decided to cut him off because he was not being exclusive with her.  Client reported it has been feeling like a blast from the past but she is keeping her boundaries intact.  Client reported otherwise she has been doing well. Evidence of progress towards goal: Client reported 1 positive of improvements with her health as well as communicating her specific boundaries and interpersonal relationships.  Suicidal/Homicidal: Nowithout intent/plan  Therapist Response:  Therapist began the appointment asking the client how she has been doing since last seen. Therapist engaged with active listening and positive emotional support. Therapist used CBT to give the client time to express her thoughts and feelings about recent changes. Therapist used CBT to positively reinforce the clients assertiveness appropriately with communicating boundaries and keeping herself active to help improve her health. Therapist used CBT ask the client to identify her progress with frequency of use with coping skills with continued practice in her daily activity.    Therapist assigned client homework to practice self-care.   Plan: Return again in 4 weeks.  Diagnosis: mdd, recurrent episode, moderate with anxious distress  Collaboration of Care: Patient refused AEB none requested by the client.  Patient/Guardian was advised Release of Information must be obtained prior to any record release in order to collaborate their care with an outside provider. Patient/Guardian was  advised if they have not already done so to contact the registration department to sign all necessary forms in order for us  to release  information regarding their care.   Consent: Patient/Guardian gives verbal consent for treatment and assignment of benefits for services provided during this visit. Patient/Guardian expressed understanding and agreed to proceed.   Chalisa Kobler Y Kseniya Grunden, LCSW 06/09/2024

## 2024-06-11 ENCOUNTER — Ambulatory Visit (HOSPITAL_COMMUNITY): Admitting: Clinical

## 2024-06-11 DIAGNOSIS — I509 Heart failure, unspecified: Secondary | ICD-10-CM | POA: Diagnosis not present

## 2024-06-12 DIAGNOSIS — I509 Heart failure, unspecified: Secondary | ICD-10-CM | POA: Diagnosis not present

## 2024-06-13 DIAGNOSIS — I5021 Acute systolic (congestive) heart failure: Secondary | ICD-10-CM | POA: Diagnosis not present

## 2024-06-13 DIAGNOSIS — N3946 Mixed incontinence: Secondary | ICD-10-CM | POA: Diagnosis not present

## 2024-06-13 DIAGNOSIS — R159 Full incontinence of feces: Secondary | ICD-10-CM | POA: Diagnosis not present

## 2024-06-13 DIAGNOSIS — R152 Fecal urgency: Secondary | ICD-10-CM | POA: Diagnosis not present

## 2024-06-15 DIAGNOSIS — I509 Heart failure, unspecified: Secondary | ICD-10-CM | POA: Diagnosis not present

## 2024-06-16 ENCOUNTER — Encounter: Payer: Self-pay | Admitting: Internal Medicine

## 2024-06-16 DIAGNOSIS — I509 Heart failure, unspecified: Secondary | ICD-10-CM | POA: Diagnosis not present

## 2024-06-16 NOTE — Progress Notes (Signed)
 Received form from [facility] RN [employer]. Pt is up for renewal of PCS services. They want me to tell them if pt still needs this or not. Pt has appt with me 07/13/2024. Will evaluate at that time.

## 2024-06-17 DIAGNOSIS — I509 Heart failure, unspecified: Secondary | ICD-10-CM | POA: Diagnosis not present

## 2024-06-18 DIAGNOSIS — I509 Heart failure, unspecified: Secondary | ICD-10-CM | POA: Diagnosis not present

## 2024-06-19 DIAGNOSIS — I509 Heart failure, unspecified: Secondary | ICD-10-CM | POA: Diagnosis not present

## 2024-06-20 DIAGNOSIS — I509 Heart failure, unspecified: Secondary | ICD-10-CM | POA: Diagnosis not present

## 2024-06-21 DIAGNOSIS — I509 Heart failure, unspecified: Secondary | ICD-10-CM | POA: Diagnosis not present

## 2024-06-22 DIAGNOSIS — I509 Heart failure, unspecified: Secondary | ICD-10-CM | POA: Diagnosis not present

## 2024-06-23 DIAGNOSIS — I509 Heart failure, unspecified: Secondary | ICD-10-CM | POA: Diagnosis not present

## 2024-06-24 DIAGNOSIS — I509 Heart failure, unspecified: Secondary | ICD-10-CM | POA: Diagnosis not present

## 2024-06-25 DIAGNOSIS — I509 Heart failure, unspecified: Secondary | ICD-10-CM | POA: Diagnosis not present

## 2024-06-26 DIAGNOSIS — I509 Heart failure, unspecified: Secondary | ICD-10-CM | POA: Diagnosis not present

## 2024-06-28 ENCOUNTER — Ambulatory Visit: Admitting: Internal Medicine

## 2024-06-28 DIAGNOSIS — I509 Heart failure, unspecified: Secondary | ICD-10-CM | POA: Diagnosis not present

## 2024-06-30 DIAGNOSIS — I509 Heart failure, unspecified: Secondary | ICD-10-CM | POA: Diagnosis not present

## 2024-07-01 DIAGNOSIS — I509 Heart failure, unspecified: Secondary | ICD-10-CM | POA: Diagnosis not present

## 2024-07-02 DIAGNOSIS — I509 Heart failure, unspecified: Secondary | ICD-10-CM | POA: Diagnosis not present

## 2024-07-03 DIAGNOSIS — I509 Heart failure, unspecified: Secondary | ICD-10-CM | POA: Diagnosis not present

## 2024-07-04 DIAGNOSIS — I509 Heart failure, unspecified: Secondary | ICD-10-CM | POA: Diagnosis not present

## 2024-07-05 DIAGNOSIS — I509 Heart failure, unspecified: Secondary | ICD-10-CM | POA: Diagnosis not present

## 2024-07-06 DIAGNOSIS — I509 Heart failure, unspecified: Secondary | ICD-10-CM | POA: Diagnosis not present

## 2024-07-07 ENCOUNTER — Ambulatory Visit (INDEPENDENT_AMBULATORY_CARE_PROVIDER_SITE_OTHER): Admitting: Clinical

## 2024-07-07 DIAGNOSIS — F331 Major depressive disorder, recurrent, moderate: Secondary | ICD-10-CM | POA: Diagnosis not present

## 2024-07-07 DIAGNOSIS — I509 Heart failure, unspecified: Secondary | ICD-10-CM | POA: Diagnosis not present

## 2024-07-08 ENCOUNTER — Ambulatory Visit (HOSPITAL_COMMUNITY)
Admission: RE | Admit: 2024-07-08 | Discharge: 2024-07-08 | Disposition: A | Discharge status: Home / Self Care | Source: Ambulatory Visit | Attending: Adult Health | Admitting: Adult Health

## 2024-07-08 ENCOUNTER — Ambulatory Visit (HOSPITAL_COMMUNITY): Payer: Self-pay | Admitting: Adult Health

## 2024-07-08 ENCOUNTER — Encounter (HOSPITAL_COMMUNITY): Payer: Self-pay | Admitting: Cardiology

## 2024-07-08 VITALS — BP 102/70 | HR 73 | Wt 277.6 lb

## 2024-07-08 DIAGNOSIS — I472 Ventricular tachycardia, unspecified: Secondary | ICD-10-CM | POA: Diagnosis not present

## 2024-07-08 DIAGNOSIS — Z8673 Personal history of transient ischemic attack (TIA), and cerebral infarction without residual deficits: Secondary | ICD-10-CM | POA: Diagnosis not present

## 2024-07-08 DIAGNOSIS — I110 Hypertensive heart disease with heart failure: Secondary | ICD-10-CM | POA: Insufficient documentation

## 2024-07-08 DIAGNOSIS — Z7984 Long term (current) use of oral hypoglycemic drugs: Secondary | ICD-10-CM | POA: Insufficient documentation

## 2024-07-08 DIAGNOSIS — Z7982 Long term (current) use of aspirin: Secondary | ICD-10-CM | POA: Insufficient documentation

## 2024-07-08 DIAGNOSIS — Z79899 Other long term (current) drug therapy: Secondary | ICD-10-CM | POA: Diagnosis not present

## 2024-07-08 DIAGNOSIS — Z6841 Body Mass Index (BMI) 40.0 and over, adult: Secondary | ICD-10-CM | POA: Insufficient documentation

## 2024-07-08 DIAGNOSIS — I5022 Chronic systolic (congestive) heart failure: Secondary | ICD-10-CM | POA: Insufficient documentation

## 2024-07-08 DIAGNOSIS — E119 Type 2 diabetes mellitus without complications: Secondary | ICD-10-CM | POA: Insufficient documentation

## 2024-07-08 DIAGNOSIS — E785 Hyperlipidemia, unspecified: Secondary | ICD-10-CM | POA: Diagnosis not present

## 2024-07-08 DIAGNOSIS — E66813 Obesity, class 3: Secondary | ICD-10-CM | POA: Diagnosis not present

## 2024-07-08 DIAGNOSIS — E669 Obesity, unspecified: Secondary | ICD-10-CM | POA: Diagnosis not present

## 2024-07-08 DIAGNOSIS — I509 Heart failure, unspecified: Secondary | ICD-10-CM | POA: Diagnosis not present

## 2024-07-08 DIAGNOSIS — R21 Rash and other nonspecific skin eruption: Secondary | ICD-10-CM | POA: Diagnosis not present

## 2024-07-08 DIAGNOSIS — Z8674 Personal history of sudden cardiac arrest: Secondary | ICD-10-CM | POA: Insufficient documentation

## 2024-07-08 DIAGNOSIS — I493 Ventricular premature depolarization: Secondary | ICD-10-CM | POA: Diagnosis not present

## 2024-07-08 DIAGNOSIS — I428 Other cardiomyopathies: Secondary | ICD-10-CM | POA: Insufficient documentation

## 2024-07-08 LAB — BASIC METABOLIC PANEL WITH GFR
Anion gap: 7 (ref 5–15)
BUN: 16 mg/dL (ref 6–20)
CO2: 25 mmol/L (ref 22–32)
Calcium: 9.6 mg/dL (ref 8.9–10.3)
Chloride: 106 mmol/L (ref 98–111)
Creatinine, Ser: 0.96 mg/dL (ref 0.44–1.00)
GFR, Estimated: >60 mL/min (ref >60)
Glucose, Bld: 110 mg/dL — ABNORMAL HIGH (ref 70–99)
Potassium: 3.9 mmol/L (ref 3.5–5.1)
Sodium: 138 mmol/L (ref 135–145)

## 2024-07-08 NOTE — Progress Notes (Signed)
 ADVANCED HEART FAILURE CLINIC NOTE  Referring Physician: Vicci Barnie NOVAK, MD  Primary Care: Vicci Barnie NOVAK, MD Primary Cardiologist: Dr. Lavona  CC: Heart Failure  HPI: Stephanie Reilly is a 53 y.o. female with hypertension, hyperlipidemia, type 2 diabetes, obesity and systolic heart failure presenting today for follow up.  Her history dates back to 02/15/23 when she was admitted to [facility] 2/2 stroke from occlusion of the proximal right M2.  She was sent to interventional radiology and intubated.  Immediately postintubation she had PEA arrest ultimately undergoing diagnostic cerebral angiogram and thrombectomy for the right M2 occlusion now status post complete recanalization.   Her cardiac history dates back to 02/07/2023 when she presented to the emergency department with chest pain and shortness of breath that started in March 2024.  During that admission patient had an echocardiogram that demonstrated EF of 20% with global hypokinesis but preserved RV function.  She underwent left heart cath which was nonsignificant and right heart cath that demonstrated preserved cardiac index.  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  She was discharged home on Entresto , Farxiga , and spironolactone  and Toprol .  Interval hx:  Today she returns for HF follow up.Overall feeling much better. She has stopped stressing about her weight which has made her feel better. Denies SOB/PND/Orthopnea. Appetite ok. No fever or chills. Taking all medications.   Current Outpatient Medications  Medication Sig Dispense Refill   aspirin  EC 81 MG tablet Take 1 tablet (81 mg total) by mouth daily. Swallow whole.     Biotin (BIOTIN MAXIMUM STRENGTH) 10 MG TABS Take 10 mg by mouth daily.     carvedilol  (COREG ) 25 MG tablet Take 1 tablet (25 mg total) by mouth 2 (two) times daily with a meal. 180 tablet 3   cetirizine (ZYRTEC) 10 MG tablet Take 10 mg by mouth daily.      dapagliflozin  propanediol (FARXIGA ) 10 MG TABS tablet Take 1 tablet (10 mg total) by mouth daily. 90 tablet 3   fluticasone (FLONASE) 50 MCG/ACT nasal spray Place 1 spray into both nostrils daily.     polyethylene glycol powder (GLYCOLAX /MIRALAX ) 17 GM/SCOOP powder MIX 17 GRAMS WITH LIQUID AND DRINK EVERY DAY AS NEEDED 510 g 3   rosuvastatin  (CRESTOR ) 20 MG tablet Take 1 tablet (20 mg total) by mouth daily. 90 tablet 3   sacubitril -valsartan  (ENTRESTO ) 97-103 MG Take 1 tablet by mouth 2 (two) times daily. 180 tablet 3   senna-docusate (SENOKOT-S) 8.6-50 MG tablet Take 1 tablet by mouth at bedtime as needed for mild constipation. 30 tablet 0   spironolactone  (ALDACTONE ) 50 MG tablet Take 1 tablet (50 mg total) by mouth daily. 30 tablet 11   torsemide  (DEMADEX ) 20 MG tablet Take 1 tablet (20 mg total) by mouth daily as needed (for increased edema). 30 tablet 11   No current facility-administered medications for this encounter.    PHYSICAL EXAM: Vitals:   07/08/24 0912  BP: 102/70  Pulse: 73  SpO2: 97%    General:   No resp difficulty Neck: no JVD.  Cor: Regular rate & rhythm. Lungs: clear Abdomen: soft, nontender, nondistended.  Extremities: no  edema Neuro: alert & oriented x3  DATA REVIEW  ECG: 03/14/23: sinus tachycardia  As per my personal interpretation  ECHO: 02/16/23: LVEF<20%, RV mildly reduced.  06/04/23: LVEF 35%.  10/29/22: LVEF 30%-35% 03/17/24: LVEF 50-55%  CATH: 02/10/23 Normal coronary anatomy Mild LV filling pressures. PCWP 23/21 with mean  20 mm Hg. LVEDP 21 mm Hg Mild pulmonary HTN PAP 42/22 with mean 30 mm Hg Cardiac output 4.81 L/min with index 2.26.  CMR: 02/11/23 1. Severe LV dilatation, normal wall thickness, and severe systolic dysfunction (EF 12%)  2.  Normal RV size with moderate systolic dysfunction (EF 31%)  3. Subendocardial LGE in focal region of mid inferior wall. Subendocardial LGE is typically ischemic in etiology and this could represent a small  infarct. However, sarcoidosis can also cause subendocardial LGE. Recommend cardiac FDG PET to evaluate for sarcoid.  4. RV insertion site LGE, which is a nonspecific scar pattern often seen in setting of elevated pulmonary pressures   ASSESSMENT & PLAN:  Heart Failure with reduced EF Etiology of YQ:wnwpdryzfpr cardiomyopathy;  NYHA class / AHA Stage:NYHA IIB Volume status & Diuretics: Appears euvolemic.  Vasodilators:Entresto  97/103mg  BID Beta-Blocker: Continue  coreg  to 25mg  BID MRA:spironolactone  25mg  daily Cardiometabolic:Farxiga  10mg  Devices therapies & Valvulopathies: improvement in EF to 50-55%  Advanced therapies:not currently indicated.  Check BMET   2. Right M2 stroke - s/p mechanical thrombectomy with small SAH - Suspect cardio embolic  -Continue aspirin  81 mg daily    3. DMII - Hgb A1C 6.1  - On Farxiga   - Currently off Wegovy  due to rashes and injection site pain.    4. NSVT /PVCs  - EP saw during recent admit, PVC burden not high enough to warrant amiodarone.   5. Obesity  - Body mass index is 50.77 kg/m. - She is trying to lose weight has been exercising.   Follow up as needed. Continue care with Dr Lavona. Check BMET today.   Stephanie Dorner NP-C  9:26 AM

## 2024-07-08 NOTE — Patient Instructions (Signed)
 Medication Changes:  No Changes In Medications at this time.   Lab Work:  Labs done today, your results will be available in MyChart, we will contact you for abnormal readings.  Follow-Up in: AS NEEDED ---- follow up with General Cardiology   At the [facility], you and your health needs are our priority. We have a designated team specialized in the treatment of Heart Failure. This Care Team includes your primary Heart Failure Specialized Cardiologist (physician), Advanced Practice Providers (APPs- Physician Assistants and Nurse Practitioners), and Pharmacist who all work together to provide you with the care you need, when you need it.   You may see any of the following providers on your designated Care Team at your next follow up:  Dr. Toribio Fuel Dr. Ezra Shuck Dr. Ria Commander Dr. Odis Brownie Greig Mosses, NP Caffie Shed, [place] [facility] [place], [place] Beckey Coe, NP [place] Lee, NP Tinnie Redman, PharmD   Please be sure to bring in all your medications bottles to every appointment.   Need to Contact Us :  If you have any questions or concerns before your next appointment please send us  a message through [place] or call our office at [phone].    TO LEAVE A MESSAGE FOR THE NURSE SELECT OPTION 2, PLEASE LEAVE A MESSAGE INCLUDING: YOUR NAME DATE OF BIRTH CALL BACK NUMBER REASON FOR CALL**this is important as we prioritize the call backs  YOU WILL RECEIVE A CALL BACK THE SAME DAY AS LONG AS YOU CALL BEFORE 4:00 PM

## 2024-07-09 DIAGNOSIS — I509 Heart failure, unspecified: Secondary | ICD-10-CM | POA: Diagnosis not present

## 2024-07-10 DIAGNOSIS — I509 Heart failure, unspecified: Secondary | ICD-10-CM | POA: Diagnosis not present

## 2024-07-12 DIAGNOSIS — I509 Heart failure, unspecified: Secondary | ICD-10-CM | POA: Diagnosis not present

## 2024-07-13 ENCOUNTER — Encounter: Payer: Self-pay | Admitting: Internal Medicine

## 2024-07-13 ENCOUNTER — Ambulatory Visit: Attending: Internal Medicine | Admitting: Internal Medicine

## 2024-07-13 VITALS — BP 128/82 | HR 85 | Temp 98.5°F | Ht 62.0 in | Wt 273.0 lb

## 2024-07-13 DIAGNOSIS — Z7982 Long term (current) use of aspirin: Secondary | ICD-10-CM

## 2024-07-13 DIAGNOSIS — Z79899 Other long term (current) drug therapy: Secondary | ICD-10-CM

## 2024-07-13 DIAGNOSIS — Z23 Encounter for immunization: Secondary | ICD-10-CM

## 2024-07-13 DIAGNOSIS — R152 Fecal urgency: Secondary | ICD-10-CM | POA: Diagnosis not present

## 2024-07-13 DIAGNOSIS — R159 Full incontinence of feces: Secondary | ICD-10-CM | POA: Diagnosis not present

## 2024-07-13 DIAGNOSIS — I5021 Acute systolic (congestive) heart failure: Secondary | ICD-10-CM | POA: Diagnosis not present

## 2024-07-13 DIAGNOSIS — Z1159 Encounter for screening for other viral diseases: Secondary | ICD-10-CM

## 2024-07-13 DIAGNOSIS — I5022 Chronic systolic (congestive) heart failure: Secondary | ICD-10-CM

## 2024-07-13 DIAGNOSIS — F411 Generalized anxiety disorder: Secondary | ICD-10-CM

## 2024-07-13 DIAGNOSIS — R7303 Prediabetes: Secondary | ICD-10-CM | POA: Diagnosis not present

## 2024-07-13 DIAGNOSIS — F330 Major depressive disorder, recurrent, mild: Secondary | ICD-10-CM

## 2024-07-13 DIAGNOSIS — G4709 Other insomnia: Secondary | ICD-10-CM

## 2024-07-13 DIAGNOSIS — I509 Heart failure, unspecified: Secondary | ICD-10-CM | POA: Diagnosis not present

## 2024-07-13 DIAGNOSIS — Z8673 Personal history of transient ischemic attack (TIA), and cerebral infarction without residual deficits: Secondary | ICD-10-CM

## 2024-07-13 DIAGNOSIS — N3946 Mixed incontinence: Secondary | ICD-10-CM | POA: Diagnosis not present

## 2024-07-13 DIAGNOSIS — Z6841 Body Mass Index (BMI) 40.0 and over, adult: Secondary | ICD-10-CM | POA: Diagnosis not present

## 2024-07-13 DIAGNOSIS — J302 Other seasonal allergic rhinitis: Secondary | ICD-10-CM

## 2024-07-13 LAB — POCT GLYCOSYLATED HEMOGLOBIN (HGB A1C): HbA1c, POC (controlled diabetic range): 6.3 % (ref 0.0–7.0)

## 2024-07-13 LAB — GLUCOSE, POCT (MANUAL RESULT ENTRY): POC Glucose: 133 mg/dL — AB (ref 70–99)

## 2024-07-13 MED ORDER — FLUTICASONE PROPIONATE 50 MCG/ACT NA SUSP: 1.0000 | Freq: Every day | NASAL | 4 refills | Status: AC

## 2024-07-13 NOTE — Patient Instructions (Signed)
  VISIT SUMMARY: During your follow-up visit, we discussed your ongoing health issues, including depression, anxiety, congestive heart failure, prediabetes, obesity, insomnia, and allergic rhinitis. We reviewed your progress and made adjustments to your treatment plan to help manage your conditions more effectively.  YOUR PLAN: -CONGESTIVE HEART FAILURE: Congestive heart failure means your heart doesn't pump blood as well as it should. Your heart function has improved to 50-55%. Continue taking Entresto , carvedilol , spironolactone , and Farxiga . We will discontinue personal care services as your condition has stabilized.  -PREDIABETES: Prediabetes means your blood sugar levels are higher than normal but not high enough to be classified as diabetes. Your A1c is 6.3, and your blood sugar was 133 mg/dL today. Continue your current diet and exercise routine and monitor your blood sugar levels regularly.  -OBESITY: Obesity means having an excess amount of body fat. Your weight has decreased from 283 lbs to 273 lbs. Continue exercising three times a week and doing home exercises. Maintain your current dietary habits focusing on balanced meals and portion control.  -DEPRESSION AND ANXIETY DISORDER: Depression and anxiety disorder are mental health conditions that affect your mood and emotions. Your symptoms are improving but not completely gone. Continue bi-weekly counseling sessions with Dr. Darnella, use relaxation techniques, and seek support from your family.  -INSOMNIA: Insomnia means having trouble sleeping. You are experiencing difficulty sleeping, often getting only 1.5 hours of sleep per night. Try sleepy time tea with melatonin or melatonin 3 mg if you can't sleep. Adjust your bedtime routine by delaying bedtime if not sleepy and avoiding screens.  -ALLERGIC RHINITIS: Allergic rhinitis means having allergy symptoms like a runny or stuffy nose due to allergens like pollen. Your symptoms are due to  weather changes. Refill your Flonase nasal spray at [facility].  INSTRUCTIONS: Please continue with your current medications and lifestyle changes. Schedule your next follow-up appointment in 3 months. If you experience any new symptoms or have concerns, contact our office.                      Contains text generated by Abridge.                                 Contains text generated by Abridge.

## 2024-07-13 NOTE — Progress Notes (Signed)
 Patient ID: Stephanie Reilly, female    DOB: [birth year]  MRN: [id number]  CC: Diabetes (DM f/u./Flu & Hep B vax administered on 07/13/2024 - C.A.)   Subjective: Stephanie Reilly is a 53 y.o. female who presents for chronic ds management. 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  History of Present Illness   Stephanie Reilly is a 53 year old female who presents for chronic disease management.  She experiences ongoing issues with depression and anxiety, with scores improving since April but remaining elevated. Her depression is triggered by personal losses, such as the death of family members or friends, leading to increased anxiety and crying spells. She has a good support system at home, including her mother. She has difficulty sleeping, often getting only an hour and a half of sleep, despite trying various relaxation techniques and adjusting her bedtime. - She continues to see her counselor every 2 weeks and finds it helpful.  Obesity/prediabetes:  Results for orders placed or performed in visit on 07/13/24  POCT glucose (manual entry)   Collection Time: 07/13/24 10:08 AM  Result Value Ref Range   POC Glucose 133 (A) 70 - 99 mg/dl  POCT glycosylated hemoglobin (Hb A1C)   Collection Time: 07/13/24 10:08 AM  Result Value Ref Range   Hemoglobin A1C     HbA1c POC (<> result, manual entry)     HbA1c, POC (prediabetic range)     HbA1c, POC (controlled diabetic range) 6.3 0.0 - 7.0 %  Her weight has decreased from 283 lbs to 273 lbs after she stopped stressing about weight loss. She exercises regularly, going to the gym three times a week and doing home exercises. Her diet includes balanced meals with vegetables, fish, [place],  or chicken, and she avoids sugary drinks and bread.  She receives fresh produce weekly from a local pantry. She uses a glucose monitor to track her blood sugar levels, which typically range between 73 and 85 mg/dL.  CHF/HTN: She takes Entresto , carvedilol , spironolactone , and Farxiga . No shortness of breath or chest pain, except for occasional gas-related discomfort. No LE edema. Her last echocardiogram in June showed improved heart function at 50-55%, up from less than 20% in May of the previous year.  She uses Flonase nasal spray for allergies, especially with the changing weather  Hx CVA: is managing her cholesterol with rosuvastatin .    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 Swallow whole.     Biotin (BIOTIN MAXIMUM STRENGTH) 10 MG TABS Take 10 mg by mouth daily.     carvedilol  (COREG ) 25 MG tablet Take 1 tablet (25 mg total) by mouth 2 (two) times daily with a meal. 180 tablet 3   cetirizine (ZYRTEC) 10 MG tablet Take 10 mg by mouth daily.     dapagliflozin  propanediol (FARXIGA ) 10 MG TABS tablet Take 1 tablet (10 mg total) by mouth  daily. 90 tablet 3   polyethylene glycol powder (GLYCOLAX /MIRALAX ) 17 GM/SCOOP powder MIX 17 GRAMS WITH LIQUID AND DRINK EVERY DAY AS NEEDED 510 g 3   rosuvastatin  (CRESTOR ) 20 MG tablet Take 1 tablet (20 mg total) by mouth daily. 90 tablet 3   sacubitril -valsartan  (ENTRESTO ) 97-103 MG Take 1 tablet by mouth 2 (two) times daily. 180 tablet 3   senna-docusate (SENOKOT-S) 8.6-50 MG tablet Take 1 tablet by mouth at bedtime as needed for mild constipation. 30 tablet 0   spironolactone  (ALDACTONE ) 50 MG tablet Take 1 tablet (50 mg total) by mouth daily. 30 tablet 11   torsemide  (DEMADEX ) 20 MG tablet Take 1 tablet (20 mg total) by mouth daily as needed (for increased edema). 30 tablet 11   No current facility-administered medications on file prior to visit.    Allergies  Allergen Reactions   Paroxetine Hcl Anaphylaxis   Prednisone Shortness Of Breath and Other (See Comments)    Confusion, messes with mental, muscles feel weird, fatigue, insomnia   Chocolate Other (See Comments)    Unknown    Ibuprofen Other (See Comments)    Unknown    Iron Other (See Comments)    GI upset (reported 08/04/2017). Pt clarifies had constipation, nausea.  Stool softeners did not help.    Latex Other (See Comments)    Unknown    Lisinopril Hives   Naproxen Other (See Comments)    Unknown    Tramadol Hives   Codeine Rash   Penicillins Rash   Serotonin Nausea Only, Swelling, Anxiety, Rash and Other (See Comments)    GI intolerence    Social History   Socioeconomic History   Marital status: Single    Spouse name: Not on file   Number of children: 3   Years of education: Not on file   Highest education level: Associate degree: occupational, Scientist, product/process development, or vocational program  Occupational History   Occupation: self employed  Tobacco Use   Smoking status: Former    Current packs/day: 0.00    Average packs/day: 0.5 packs/day for 11.0 years (5.5 ttl pk-yrs)    Types: Cigarettes    Start date: 10/2010     Quit date: 10/2021    Years since quitting: 2.7    Passive exposure: Past   Smokeless tobacco: Never  Vaping Use   Vaping status: Never Used  Substance and Sexual Activity   Alcohol use: Never   Drug use: Yes    Types: Marijuana    Comment: routine use of CBD edibles   Sexual activity: Not on file  Other Topics Concern   Not on file  Social History Narrative   Not on file   Social Drivers of Health   Financial Resource Strain: Patient Declined (07/09/2024)   Overall Financial Resource Strain (CARDIA)    Difficulty of Paying Living Expenses: Patient declined  Food Insecurity: Food Insecurity Present (07/09/2024)   Hunger Vital Sign    Worried About Running Out of Food in the Last Year: Often true  Ran Out of Food in the Last Year: Often true  Transportation Needs: Unmet Transportation Needs (07/09/2024)   PRAPARE - Administrator, Civil Service (Medical): Yes    Lack of Transportation (Non-Medical): Yes  Physical Activity: Insufficiently Active (07/09/2024)   Exercise Vital Sign    Days of Exercise per Week: 3 days    Minutes of Exercise per Session: 20 min  Stress: Stress Concern Present (07/09/2024)   [employer] of Occupational Health - Occupational Stress Questionnaire    Feeling of Stress: Very much  Social Connections: Moderately Isolated (07/09/2024)   Social Connection and Isolation Panel    Frequency of Communication with Friends and Family: Three times a week    Frequency of Social Gatherings with Friends and Family: Never    Attends Religious Services: More than 4 times per year    Active Member of Clubs or Organizations: No    Attends Engineer, structural: Not on file    Marital Status: Never married  Intimate Partner Violence: Not At Risk (07/25/2023)   Humiliation, Afraid, Rape, and Kick questionnaire    Fear of Current or Ex-Partner: No    Emotionally Abused: No    Physically Abused: No    Sexually Abused: No    Family  History  Problem Relation Age of Onset   Hypertension Mother    Heart failure Mother    COPD Mother    Diabetes Paternal Grandfather     Past Surgical History:  Procedure Laterality Date   ABDOMINAL HYSTERECTOMY     IR CT HEAD LTD  02/15/2023   IR PERCUTANEOUS ART THROMBECTOMY/INFUSION INTRACRANIAL INC DIAG ANGIO  02/15/2023   IR US  GUIDE VASC ACCESS RIGHT  02/15/2023   RADIOLOGY WITH ANESTHESIA N/A 02/15/2023   Procedure: IR WITH ANESTHESIA;  Surgeon: Radiologist, Medication, MD;  Location: [facility] OR;  Service: Radiology;  Laterality: N/A;   RIGHT/LEFT HEART CATH AND CORONARY ANGIOGRAPHY N/A 02/10/2023   Procedure: RIGHT/LEFT HEART CATH AND CORONARY ANGIOGRAPHY;  Surgeon: [place], Peter M, MD;  Location: [facility] INVASIVE CV LAB;  Service: Cardiovascular;  Laterality: N/A;   TONSILLECTOMY     TUBAL LIGATION      ROS: Review of Systems Negative except as stated above  PHYSICAL EXAM: BP 128/82   Pulse 85   Temp 98.5 F (36.9 C) (Oral)   Ht 5' 2 (1.575 m)   Wt 273 lb (123.8 kg)   SpO2 96%   BMI 49.93 kg/m   Wt Readings from Last 3 Encounters:  07/13/24 273 lb (123.8 kg)  07/08/24 277 lb 9.6 oz (125.9 kg)  05/10/24 273 lb 9.6 oz (124.1 kg)    Physical Exam  General appearance - alert, well appearing, obese middle age AAF and in no distress Mental status - normal mood, behavior, speech, dress, motor activity, and thought processes Neck - supple, no significant adenopathy Chest - clear to auscultation, no wheezes, rales or rhonchi, symmetric air entry Heart - normal rate, regular rhythm, normal S1, S2, no murmurs, rubs, clicks or gallops Extremities - no Le edema     07/13/2024   10:07 AM 02/24/2024   10:03 AM 12/24/2023   11:33 AM  Depression screen PHQ 2/9  Decreased Interest 1 2 1   Down, Depressed, Hopeless 2 2 2   PHQ - 2 Score 3 4 3   Altered sleeping 3 3 3   Tired, decreased energy 2 3 2   Change in appetite 3 3 2   Feeling bad or failure about yourself  2  1 1  Trouble  concentrating 0 0 0  Moving slowly or fidgety/restless 0 0 0  Suicidal thoughts 0 0 0  PHQ-9 Score 13 14 11   Difficult doing work/chores Somewhat difficult Extremely dIfficult Somewhat difficult        Latest Ref Rng & Units 07/08/2024   10:08 AM 03/17/2024   10:16 AM 10/30/2023   11:04 AM  CMP  Glucose 70 - 99 mg/dL 889  891  97   BUN 6 - 20 mg/dL 16  14  15    Creatinine 0.44 - 1.00 mg/dL 9.03  9.20  9.01   Sodium 135 - 145 mmol/L 138  139  136   Potassium 3.5 - 5.1 mmol/L 3.9  4.5  4.4   Chloride 98 - 111 mmol/L 106  111  102   CO2 22 - 32 mmol/L 25  23  24    Calcium  8.9 - 10.3 mg/dL 9.6  89.5  89.5    Lipid Panel     Component Value Date/Time   CHOL 155 02/16/2023 0945   TRIG 62 02/16/2023 0945   HDL 45 02/16/2023 0945   CHOLHDL 3.4 02/16/2023 0945   VLDL 12 02/16/2023 0945   LDLCALC 98 02/16/2023 0945    CBC    Component Value Date/Time   WBC 10.4 02/28/2023 1028   RBC 5.14 (H) 02/28/2023 1028   HGB 13.7 02/28/2023 1028   HCT 43.5 02/28/2023 1028   PLT 333 02/28/2023 1028   MCV 84.6 02/28/2023 1028   MCH 26.7 02/28/2023 1028   MCHC 31.5 02/28/2023 1028   RDW 15.3 02/28/2023 1028   LYMPHSABS 3.3 02/15/2023 1245   MONOABS 0.9 02/15/2023 1245   EOSABS 0.1 02/15/2023 1245   BASOSABS 0.1 02/15/2023 1245    ASSESSMENT AND PLAN: 1. Morbid obesity with BMI of 45.0-49.9, adult (HCC) (Primary) Commended her on changing her eating habits times a week. Encouraged to continue both Since she is doing so well, we agreed that she no longer needs PCS services. - CBC  2. Prediabetes See #1 above. Continue Farxiga  - POCT glucose (manual entry) - POCT glycosylated hemoglobin (Hb A1C) - Microalbumin / creatinine urine ratio  3. Chronic systolic (congestive) heart failure (HCC) Stable and compensated with improved GFR.  Continue Farxiga  10 mg daily, torsemide  20 mg daily as needed, spironolactone  50 mg daily, Entresto  97/103 mg twice a day and carvedilol  25 mg twice a  day - Hepatic Function Panel - Lipid panel  4. Major depressive disorder, recurrent episode, mild 5. GAD (generalized anxiety disorder) Doing better overall. She will continue seeing her therapist  6. Other insomnia Good sleep hygiene discussed and encouraged. Patient advised not to drink any caffeinated beverages or excessive alcohol use within several hours of bedtime.  Advised to get in bed around about the same time every night.  Once in bed, turn off all lights and sounds.  If unable to fall asleep within 30 to 45 minutes of getting in bed, patient should get up and try to do something until she feels sleepy again.  At that time try getting back in bed. - Patient tells me she plans to try Sleepy Time Tea that contains melatonin.   7. Seasonal allergies - fluticasone (FLONASE) 50 MCG/ACT nasal spray; Place 1 spray into both nostrils daily.  Dispense: 9.9 mL; Refill: 4  8. History of CVA (cerebrovascular accident) without residual deficits Stable Continue ASA 81 mg and Crestor  20 mg - Hepatic Function Panel - Lipid panel  9.  Need for immunization against influenza - Flu vaccine trivalent PF, 6mos and older(Flulaval,Afluria,Fluarix,Fluzone)  10. Need for hepatitis B vaccination Given 2nd hep B vaccine shot today  11. Need for hepatitis C screening test - Hepatitis C Antibody  Patient was given the opportunity to ask questions.  Patient verbalized understanding of the plan and was able to repeat key elements of the plan.   This documentation was completed using Paediatric nurse.  Any transcriptional errors are unintentional.  Orders Placed This Encounter  Procedures   Flu vaccine trivalent PF, 6mos and older(Flulaval,Afluria,Fluarix,Fluzone)   Heplisav-B (HepB-CPG) Vaccine   CBC   Hepatitis C Antibody   Microalbumin / creatinine urine ratio   Hepatic Function Panel   Lipid panel   POCT glucose (manual entry)   POCT glycosylated hemoglobin (Hb A1C)      Requested Prescriptions   Signed Prescriptions Disp Refills   fluticasone (FLONASE) 50 MCG/ACT nasal spray 9.9 mL 4    Sig: Place 1 spray into both nostrils daily.    Return in about 4 months (around 11/13/2024).  Barnie Louder, MD, FACP

## 2024-07-14 ENCOUNTER — Ambulatory Visit: Payer: Self-pay | Admitting: Internal Medicine

## 2024-07-14 DIAGNOSIS — I509 Heart failure, unspecified: Secondary | ICD-10-CM | POA: Diagnosis not present

## 2024-07-15 ENCOUNTER — Telehealth: Payer: Self-pay

## 2024-07-15 DIAGNOSIS — I509 Heart failure, unspecified: Secondary | ICD-10-CM | POA: Diagnosis not present

## 2024-07-15 LAB — LIPID PANEL
Chol/HDL Ratio: 2.5 ratio (ref 0.0–4.4)
Cholesterol, Total: 136 mg/dL (ref 100–199)
HDL: 55 mg/dL (ref >39)
LDL Chol Calc (NIH): 64 mg/dL (ref 0–99)
Triglycerides: 87 mg/dL (ref 0–149)
VLDL Cholesterol Cal: 17 mg/dL (ref 5–40)

## 2024-07-15 LAB — MICROALBUMIN / CREATININE URINE RATIO
Creatinine, Urine: 125.8 mg/dL
Microalb/Creat Ratio: 3 mg/g{creat} (ref 0–29)
Microalbumin, Urine: 3.5 ug/mL

## 2024-07-15 LAB — CBC
Hematocrit: 42.2 % (ref 34.0–46.6)
Hemoglobin: 13.2 g/dL (ref 11.1–15.9)
MCH: 26.3 pg — ABNORMAL LOW (ref 26.6–33.0)
MCHC: 31.3 g/dL — ABNORMAL LOW (ref 31.5–35.7)
MCV: 84 fL (ref 79–97)
Platelets: 289 x10E3/uL (ref 150–450)
RBC: 5.02 x10E6/uL (ref 3.77–5.28)
RDW: 14.5 % (ref 11.7–15.4)
WBC: 8.8 x10E3/uL (ref 3.4–10.8)

## 2024-07-15 LAB — HEPATITIS C ANTIBODY: Hep C Virus Ab: NONREACTIVE

## 2024-07-15 LAB — HEPATIC FUNCTION PANEL
ALT: 15 IU/L (ref 0–32)
AST: 14 IU/L (ref 0–40)
Albumin: 4.4 g/dL (ref 3.8–4.9)
Alkaline Phosphatase: 139 IU/L — ABNORMAL HIGH (ref 49–135)
Bilirubin Total: 0.3 mg/dL (ref 0.0–1.2)
Bilirubin, Direct: 0.1 mg/dL (ref 0.00–0.40)
Total Protein: 8.2 g/dL (ref 6.0–8.5)

## 2024-07-15 NOTE — Telephone Encounter (Addendum)
 PCS form received from [facility].  Per Dr Vicci, the patient no longer qualifies for PCS.   I emailed this information to [email or web address] and [email or web address]

## 2024-07-16 DIAGNOSIS — I509 Heart failure, unspecified: Secondary | ICD-10-CM | POA: Diagnosis not present

## 2024-07-17 DIAGNOSIS — I509 Heart failure, unspecified: Secondary | ICD-10-CM | POA: Diagnosis not present

## 2024-07-18 DIAGNOSIS — I509 Heart failure, unspecified: Secondary | ICD-10-CM | POA: Diagnosis not present

## 2024-07-19 DIAGNOSIS — I509 Heart failure, unspecified: Secondary | ICD-10-CM | POA: Diagnosis not present

## 2024-07-20 DIAGNOSIS — I509 Heart failure, unspecified: Secondary | ICD-10-CM | POA: Diagnosis not present

## 2024-07-21 DIAGNOSIS — I509 Heart failure, unspecified: Secondary | ICD-10-CM | POA: Diagnosis not present

## 2024-07-22 DIAGNOSIS — I509 Heart failure, unspecified: Secondary | ICD-10-CM | POA: Diagnosis not present

## 2024-07-23 DIAGNOSIS — I509 Heart failure, unspecified: Secondary | ICD-10-CM | POA: Diagnosis not present

## 2024-07-24 DIAGNOSIS — I509 Heart failure, unspecified: Secondary | ICD-10-CM | POA: Diagnosis not present

## 2024-07-25 DIAGNOSIS — I509 Heart failure, unspecified: Secondary | ICD-10-CM | POA: Diagnosis not present

## 2024-07-26 DIAGNOSIS — I509 Heart failure, unspecified: Secondary | ICD-10-CM | POA: Diagnosis not present

## 2024-07-27 ENCOUNTER — Ambulatory Visit: Payer: Medicaid Other | Admitting: Family Medicine

## 2024-07-27 DIAGNOSIS — I509 Heart failure, unspecified: Secondary | ICD-10-CM | POA: Diagnosis not present

## 2024-07-28 ENCOUNTER — Ambulatory Visit (INDEPENDENT_AMBULATORY_CARE_PROVIDER_SITE_OTHER): Admitting: Clinical

## 2024-07-28 DIAGNOSIS — F331 Major depressive disorder, recurrent, moderate: Secondary | ICD-10-CM

## 2024-07-28 NOTE — Progress Notes (Signed)
   THERAPIST PROGRESS NOTE Virtual Visit via Video Note  I connected with Stephanie Reilly on 07/28/2024 at  2:00 PM EDT by a video enabled telemedicine application and verified that I am speaking with the correct person using two identifiers.  Location: Patient: home Provider: office   I discussed the limitations of evaluation and management by telemedicine and the availability of in person appointments. The patient expressed understanding and agreed to proceed.   Follow Up Instructions: I discussed the assessment and treatment plan with the patient. The patient was provided an opportunity to ask questions and all were answered. The patient agreed with the plan and demonstrated an understanding of the instructions.   The patient was advised to call back or seek an in-person evaluation if the symptoms worsen or if the condition fails to improve as anticipated.    Session Time: 30 min  Participation Level: Active  Behavioral Response: CasualAlertEuthymic  Type of Therapy: Individual Therapy  Treatment Goals addressed: client will engage in at least 80% of scheduled individual psychotherapy sessions  ProgressTowards Goals: Progressing  Interventions: CBT and Supportive  Summary:  Stephanie Reilly is a 53 y.o. female who presents for the scheduled appointment oriented times five, appropriately dressed and friendly. Client denied hallucinations and delusions. Client reported she is doing well today. Client reported they have been moving her sister to a nursing home closer to town. Client reported she's been gone since 9am this morning. Client reported but her sisters are doing well. Client reported 2 weeks ago her mother had to go to the ER but is doing better. Client reported her mom is doing better. Client reported she is happy to report she has gone from 280lbs to 250lbs. Client reported no other concerns. Evidence of progress towards goal:  client reported 1 positive of continuing to  improve with making healthier lifestyle changes.  Suicidal/Homicidal: Nowithout intent/plan  Therapist Response:  Therapist began the appointment asking the client how she has been doing Therapist engaged with active listening and positive emotional support. Therapist used cbt to engage and give her time to discuss her thoughts and concerns. Therapist used cbt to teach her about self care. Therapist used CBT ask the client to identify her progress with frequency of use with coping skills with continued practice in her daily activity.    Therapist assigned homework to practice self care.    Plan: Return again in 4 weeks.  Diagnosis: mdd, recurrent episode, moderate with anxious distress  Collaboration of Care: Patient refused AEB none requested by the client.  Patient/Guardian was advised Release of Information must be obtained prior to any record release in order to collaborate their care with an outside provider. Patient/Guardian was advised if they have not already done so to contact the registration department to sign all necessary forms in order for us  to release information regarding their care.   Consent: Patient/Guardian gives verbal consent for treatment and assignment of benefits for services provided during this visit. Patient/Guardian expressed understanding and agreed to proceed.   Kenroy Timberman Y Naavya Postma, LCSW 07/28/2024

## 2024-07-29 DIAGNOSIS — I509 Heart failure, unspecified: Secondary | ICD-10-CM | POA: Diagnosis not present

## 2024-07-30 DIAGNOSIS — I509 Heart failure, unspecified: Secondary | ICD-10-CM | POA: Diagnosis not present

## 2024-07-31 DIAGNOSIS — I509 Heart failure, unspecified: Secondary | ICD-10-CM | POA: Diagnosis not present

## 2024-08-01 DIAGNOSIS — I509 Heart failure, unspecified: Secondary | ICD-10-CM | POA: Diagnosis not present

## 2024-08-02 DIAGNOSIS — I509 Heart failure, unspecified: Secondary | ICD-10-CM | POA: Diagnosis not present

## 2024-08-03 DIAGNOSIS — I509 Heart failure, unspecified: Secondary | ICD-10-CM | POA: Diagnosis not present

## 2024-08-04 DIAGNOSIS — I509 Heart failure, unspecified: Secondary | ICD-10-CM | POA: Diagnosis not present

## 2024-08-05 DIAGNOSIS — I509 Heart failure, unspecified: Secondary | ICD-10-CM | POA: Diagnosis not present

## 2024-08-06 DIAGNOSIS — I509 Heart failure, unspecified: Secondary | ICD-10-CM | POA: Diagnosis not present

## 2024-08-06 NOTE — Progress Notes (Signed)
   THERAPIST PROGRESS NOTE Virtual Visit via Video Note  I connected with Stephanie Reilly on 07/07/2024 at 11:00 AM EDT by a video enabled telemedicine application and verified that I am speaking with the correct person using two identifiers.  Location: Patient: home Provider: office   I discussed the limitations of evaluation and management by telemedicine and the availability of in person appointments. The patient expressed understanding and agreed to proceed.   Follow Up Instructions: I discussed the assessment and treatment plan with the patient. The patient was provided an opportunity to ask questions and all were answered. The patient agreed with the plan and demonstrated an understanding of the instructions.   The patient was advised to call back or seek an in-person evaluation if the symptoms worsen or if the condition fails to improve as anticipated.   Session Time: 45 min  Participation Level: Active  Behavioral Response: CasualAlertEuthymic  Type of Therapy: Individual Therapy  Treatment Goals addressed: client will engage in at least 80% of scheduled individual psychotherapy sessions  ProgressTowards Goals: Progressing  Interventions: CBT and Supportive  Summary:  Stephanie Reilly is a 53 y.o. female who presents for the scheduled appointment oriented times five, appropriately dressed and friendly. Client denied hallucinations and delusions. Client reported she is doing well today. Client reported she has been working on taking a simpler approach to dieting. Client reported she does not want to have so much pressure on herself. Client reported she is learning to cook different vegetables and make them better to taste. Client reported this is going in hand with changing negative thoughts about her self image.  Evidence of progress towards goal:  client reported 1 positive of making practical changes for lifestyle and self confidence.   Suicidal/Homicidal: Nowithout  intent/plan  Therapist Response:  Therapist began the appointment asking the client how she has been doing. Therapist engaged with active listening and positive emotional support. Therapist used cbt to engage give her time to discuss her thoughts and feelings. Therapist used cbt to positively reinforce her initiative of making sustainable changes to improve her self confidence and health. Therapist used CBT ask the client to identify her progress with frequency of use with coping skills with continued practice in her daily activity.      Plan: Return again in 3 weeks.  Diagnosis: mdd, recurrent episode, moderate with anxious distress  Collaboration of Care: Patient refused AEB none requested by the client.  Patient/Guardian was advised Release of Information must be obtained prior to any record release in order to collaborate their care with an outside provider. Patient/Guardian was advised if they have not already done so to contact the registration department to sign all necessary forms in order for us  to release information regarding their care.   Consent: Patient/Guardian gives verbal consent for treatment and assignment of benefits for services provided during this visit. Patient/Guardian expressed understanding and agreed to proceed.   Tymara Saur Y Lisl Slingerland, LCSW 07/07/2024

## 2024-08-09 ENCOUNTER — Telehealth: Payer: Self-pay | Admitting: Internal Medicine

## 2024-08-09 ENCOUNTER — Encounter: Payer: Self-pay | Admitting: Radiology

## 2024-08-09 NOTE — Telephone Encounter (Signed)
 Copied from CRM [id number]. Topic: General - Other >> Aug 09, 2024  2:55 PM [name] wrote:  Reason for CRM: Form needs to be sent to assessment nurse to keep her aide, still having issues- please call [phone], need it this week to get the aide started back up again.  Was not told aide would be discontinued just the products.

## 2024-08-11 NOTE — Telephone Encounter (Signed)
 See pt's message.  I had received form from the state prior to pt's last visit with me 1 mth ago wanting to know if pt still needs PCP or not. I discussed with pt on last visit. She reported going to gym 3 times a week and doing well. We agreed that she no longer needed PCS and I got her agreement to send back form indicating that. Please see my note. Now she is calling wanting it reinstated. I would suggest having them go out to do their usual eval to see if she really still needs it.

## 2024-08-11 NOTE — Telephone Encounter (Signed)
 Copied from CRM [id number]. Topic: General - Other >> Aug 11, 2024  8:32 AM Berneda FALCON wrote:  Reason for CRM: Patient is calling back again and wants to make sure that the aide is reestablished today if possible please. The patient states that she took 2 hours just to cook her food because she could not stand for long periods of time. She is also having trouble with the everyday tasks. She is also having trouble getting in and out of the bathtub and had to ask her mother who is 58.   She states that removing the aide was not discussed with her or she would have told PCP that she still needs the aide. There must have been some miscommunication there.  She wants this to be resolved today if at all possible, please. Would like this faxed to Martine Bunnell today for the forms since it is due today.  Patient callback is [phone] (home)

## 2024-08-12 DIAGNOSIS — N3946 Mixed incontinence: Secondary | ICD-10-CM | POA: Diagnosis not present

## 2024-08-12 DIAGNOSIS — R159 Full incontinence of feces: Secondary | ICD-10-CM | POA: Diagnosis not present

## 2024-08-12 DIAGNOSIS — R152 Fecal urgency: Secondary | ICD-10-CM | POA: Diagnosis not present

## 2024-08-12 DIAGNOSIS — I5021 Acute systolic (congestive) heart failure: Secondary | ICD-10-CM | POA: Diagnosis not present

## 2024-08-12 NOTE — Telephone Encounter (Signed)
 Signed PCS request faxed to [facility]  [facility] care coordination : [phone] and emailed to [facility] as patient requested

## 2024-08-19 ENCOUNTER — Ambulatory Visit (INDEPENDENT_AMBULATORY_CARE_PROVIDER_SITE_OTHER): Admitting: Clinical

## 2024-08-19 DIAGNOSIS — F331 Major depressive disorder, recurrent, moderate: Secondary | ICD-10-CM

## 2024-09-01 ENCOUNTER — Other Ambulatory Visit (HOSPITAL_COMMUNITY): Payer: Self-pay

## 2024-09-01 ENCOUNTER — Other Ambulatory Visit: Payer: Self-pay | Admitting: Internal Medicine

## 2024-09-01 MED ORDER — ROSUVASTATIN CALCIUM 20 MG PO TABS: 20.0000 mg | ORAL_TABLET | Freq: Every day | ORAL | 3 refills | Status: AC

## 2024-09-01 NOTE — Telephone Encounter (Signed)
 Copied from CRM #[id number]. Topic: Clinical - Medication Refill >> Sep 01, 2024  8:33 AM Victoria B wrote: Medication: rosuvastatin  (CRESTOR ) 20 MG tablet  Has the patient contacted their pharmacy? yes (Agent: If no, request that the patient contact the pharmacy for the refill. If patient does not wish to contact the pharmacy document the reason why and proceed with request.) (Agent: If yes, when and what did the pharmacy advise?)was told they will request but nothing was shown, I'm requesting  This is the patient's preferred pharmacy:  [id number] - [place], [place] - [address] [place] [address] Phone: [phone] Fax: [phone]  Is this the correct pharmacy for this prescription? yes Has the prescription been filled recently? no  Is the patient out of the medication? yes  Has the patient been seen for an appointment in the last year OR does the patient have an upcoming appointment? yes  Can we respond through MyChart? yes  Agent: Please be advised that Rx refills may take up to 3 business days. We ask that you follow-up with your pharmacy.

## 2024-09-02 NOTE — Progress Notes (Signed)
   THERAPIST PROGRESS NOTE Virtual Visit via Video Note  I connected with Stephanie Reilly on 08/19/2024 at  2:00 PM EST by a video enabled telemedicine application and verified that I am speaking with the correct person using two identifiers.  Location: Patient: home Provider: office   I discussed the limitations of evaluation and management by telemedicine and the availability of in person appointments. The patient expressed understanding and agreed to proceed.   Follow Up Instructions: I discussed the assessment and treatment plan with the patient. The patient was provided an opportunity to ask questions and all were answered. The patient agreed with the plan and demonstrated an understanding of the instructions.   The patient was advised to call back or seek an in-person evaluation if the symptoms worsen or if the condition fails to improve as anticipated.   Session Time: 30 min  Participation Level: Active  Behavioral Response: CasualAlertEuthymic  Type of Therapy: Individual Therapy  Treatment Goals addressed: client will engage in at least 80% of scheduled individual psychotherapy sessions  ProgressTowards Goals: Progressing  Interventions: CBT and Supportive  Summary:  Stephanie Reilly is a 53 y.o. female who presents for the scheduled appointment oriented times five, appropriately dressed and friendly. Client denied hallucinations and delusions. Client reported she is doing pretty well. Client reported she celebrated her birthday with her kids and grandkids. Client reported otherwise not much has changed. Client reported she is doing well with working on her weight loss and making better eating habits. Client reported her sister is doing good in the nursing home they changed her too. Client reported she has gotten some nice things for herself. Client reported no other complaints. Evidence of progress towards goal:  client reported 1 positive of being able to enjoy positive  thoughts and emotions in the moment.   Suicidal/Homicidal: Nowithout intent/plan  Therapist Response:  Therapist began the appointment asking how she has been doing. Therapist engaged with active listening and positive emotional support. Therapist used cbt to give her time to discuss her thoughts and feelings. Therapist used CBT ask the client to identify her progress with frequency of use with coping skills with continued practice in her daily activity.      Plan: Return again in 4 weeks.  Diagnosis: mdd, recurrent episode, moderate with anxious distress  Collaboration of Care: Patient refused AEB none requested by the client.  Patient/Guardian was advised Release of Information must be obtained prior to any record release in order to collaborate their care with an outside provider. Patient/Guardian was advised if they have not already done so to contact the registration department to sign all necessary forms in order for us  to release information regarding their care.   Consent: Patient/Guardian gives verbal consent for treatment and assignment of benefits for services provided during this visit. Patient/Guardian expressed understanding and agreed to proceed.   Maryssa Giampietro Y Cosandra Plouffe, LCSW 08/19/2024

## 2024-09-06 DIAGNOSIS — F324 Major depressive disorder, single episode, in partial remission: Secondary | ICD-10-CM | POA: Diagnosis not present

## 2024-09-06 DIAGNOSIS — I130 Hypertensive heart and chronic kidney disease with heart failure and stage 1 through stage 4 chronic kidney disease, or unspecified chronic kidney disease: Secondary | ICD-10-CM | POA: Diagnosis not present

## 2024-09-06 DIAGNOSIS — I429 Cardiomyopathy, unspecified: Secondary | ICD-10-CM | POA: Diagnosis not present

## 2024-09-06 DIAGNOSIS — Z8249 Family history of ischemic heart disease and other diseases of the circulatory system: Secondary | ICD-10-CM | POA: Diagnosis not present

## 2024-09-06 DIAGNOSIS — I509 Heart failure, unspecified: Secondary | ICD-10-CM | POA: Diagnosis not present

## 2024-09-06 DIAGNOSIS — N182 Chronic kidney disease, stage 2 (mild): Secondary | ICD-10-CM | POA: Diagnosis not present

## 2024-09-06 DIAGNOSIS — I252 Old myocardial infarction: Secondary | ICD-10-CM | POA: Diagnosis not present

## 2024-09-06 DIAGNOSIS — Q249 Congenital malformation of heart, unspecified: Secondary | ICD-10-CM | POA: Diagnosis not present

## 2024-09-06 DIAGNOSIS — E785 Hyperlipidemia, unspecified: Secondary | ICD-10-CM | POA: Diagnosis not present

## 2024-09-06 DIAGNOSIS — R32 Unspecified urinary incontinence: Secondary | ICD-10-CM | POA: Diagnosis not present

## 2024-09-06 DIAGNOSIS — I272 Pulmonary hypertension, unspecified: Secondary | ICD-10-CM | POA: Diagnosis not present

## 2024-09-06 NOTE — Telephone Encounter (Signed)
 Unable to refill per protocol, last refill by another provider. Duplicate request.  Requested Prescriptions  Pending Prescriptions Disp Refills   rosuvastatin  (CRESTOR ) 20 MG tablet 90 tablet 3    Sig: Take 1 tablet (20 mg total) by mouth daily.     Cardiovascular:  Antilipid - Statins 2 Failed - 09/06/2024  9:50 AM      Failed - Lipid Panel in normal range within the last 12 months    Cholesterol, Total  Date Value Ref Range Status  07/13/2024 136 100 - 199 mg/dL Final   LDL Chol Calc (NIH)  Date Value Ref Range Status  07/13/2024 64 0 - 99 mg/dL Final   HDL  Date Value Ref Range Status  07/13/2024 55 >39 mg/dL Final   Triglycerides  Date Value Ref Range Status  07/13/2024 87 0 - 149 mg/dL Final         Passed - Cr in normal range and within 360 days    Creatinine, Ser  Date Value Ref Range Status  07/08/2024 0.96 0.44 - 1.00 mg/dL Final         Passed - Patient is not pregnant      Passed - Valid encounter within last 12 months    Recent Outpatient Visits           1 month ago Morbid obesity with BMI of 45.0-49.9, adult (HCC)   [facility] [place] Vicci Sober B, MD   6 months ago Morbid obesity with BMI of 45.0-49.9, adult [facility])   [facility] Of [place]. [facility] Vicci Sober NOVAK, MD   7 months ago Adverse effect of drug, initial encounter   [facility] Of [place] Vicci Sober B, MD   8 months ago Obesity, Class III, BMI 40-49.9 (morbid obesity) (HCC)   [facility] [place] Vicci Sober B, MD   11 months ago Obesity, Class III, BMI 40-49.9 (morbid obesity) (HCC)    [facility] [place] Vicci Sober NOVAK, MD       Future Appointments             In 3 days Lavona Agent, MD [facility]  at [facility] A Dept of [employer]. [facility] [employer], [employer]

## 2024-09-08 NOTE — Progress Notes (Deleted)
 Cardiology Office Note:   Date:  09/08/2024  ID:  Stephanie Reilly, DOB [birth year], MRN [id number] PCP: Vicci Barnie NOVAK, MD  [facility] Providers Cardiologist:  Lynwood Schilling, MD Electrophysiologist:  Will Gladis Norton, MD {  History of Present Illness:   Tris Howell is a 53 y.o. female with hypertension, hyperlipidemia, type 2 diabetes, obesity and recently diagnosed systolic heart failure.  She was in the hospital with stroke and occlusion of the proximal right M2.  She had PEA arrest and was intubated.    EF was 20%.  Left heart cath demonstrated normal coronaries.    She was treated with Eliquis .  She wore a life vest. .  She had an MRI and I reviewed these results.  The ejection fraction was said to be 12%.  The there was a small mid inferior wall subendocardial infarct possible and they could not exclude sarcoid.  A PET scan is ordered.  This was not done. Echo in May demonstrated an EF of less than 20%.    MRI May 2024 demonstrated  subendocardial LGE in a focal region of the mid inferior wall.  Sarcoid could not be excluded.  FDG PET has been ordered but was not done because of insurance problems.  The EF was 30 - 35% on echo in August, 2024.  Most recently the EF thankfully in June was 50 to 55%.  She did have abnormal strain.   Since she was last seen ***   ***   was seen she has done well.  She does some exercise to include some walking, working with 15 pound weights, squats, chair exercises, yoga.  The patient denies any new symptoms such as chest discomfort, neck or arm discomfort. There has been no new shortness of breath, PND or orthopnea. There have been no reported palpitations, presyncope or syncope.   ROS: ***  Studies Reviewed:    EKG:       ***  Risk Assessment/Calculations:   {Does this patient have ATRIAL FIBRILLATION?:[phone]} No BP recorded.  {Refresh Note OR Click here to enter BP  :1}***        Physical Exam:   VS:  There were no vitals taken  for this visit.   Wt Readings from Last 3 Encounters:  07/13/24 273 lb (123.8 kg)  07/08/24 277 lb 9.6 oz (125.9 kg)  05/10/24 273 lb 9.6 oz (124.1 kg)     GEN: Well nourished, well developed in no acute distress NECK: No JVD; No carotid bruits CARDIAC: ***RR, *** murmurs, rubs, gallops RESPIRATORY:  Clear to auscultation without rales, wheezing or rhonchi  ABDOMEN: Soft, non-tender, non-distended EXTREMITIES:  No edema; No deformity   ASSESSMENT AND PLAN:   Heart Failure with reduced EF:   ***   Her ejection fraction is now much improved.  She is on optimal medical therapy.  No change other than as below.    Right M2 stroke:   ***    I went back through her previous hospitalization records including her neurology notes.  She was thought to have had embolic stroke related to her reduced ejection fraction which is now improved.  There was no other indication for DOAC.  She can stop the Eliquis .  She will go on an aspirin  81 mg daily.    DMII: ***  She had lost weight.  She could not however tolerate SGLT2 inhibitor.  She is working with her primary care doctor.  Unfortunately her weight is back up.  I will defer  management of her diabetes to Vicci Barnie NOVAK, MD   NSVT /PVCs :   ***  She is not having any symptoms related to this.  No change in therapy.    Secondary hypercoaguable state:   ***  As above she can stop the Eliquis .     Follow up ***  Signed, Lynwood Schilling, MD

## 2024-09-09 ENCOUNTER — Ambulatory Visit: Admitting: Cardiology

## 2024-09-09 DIAGNOSIS — I5022 Chronic systolic (congestive) heart failure: Secondary | ICD-10-CM

## 2024-09-09 DIAGNOSIS — I4729 Other ventricular tachycardia: Secondary | ICD-10-CM

## 2024-09-09 DIAGNOSIS — E118 Type 2 diabetes mellitus with unspecified complications: Secondary | ICD-10-CM

## 2024-09-09 NOTE — Progress Notes (Unsigned)
 Cardiology Office Note:   Date:  09/10/2024  ID:  Stephanie Reilly, DOB [birth year], MRN [id number] PCP: Vicci Barnie NOVAK, MD  [facility] Providers Cardiologist:  Lynwood Schilling, MD Electrophysiologist:  Will Gladis Norton, MD {  History of Present Illness:   Stephanie Reilly is a 53 y.o. female with hypertension, hyperlipidemia, type 2 diabetes, obesity and recently diagnosed systolic heart failure.  She was in the hospital with stroke and occlusion of the proximal right M2.  She had PEA arrest and was intubated.    EF was 20%.  Left heart cath demonstrated normal coronaries.    She was treated with Eliquis .  She wore a life vest.  She had an MRI and I reviewed these results.  The ejection fraction was said to be 12%.  The there was a small mid inferior wall subendocardial infarct possible and they could not exclude sarcoid.  Echo in May 2024 demonstrated an EF of less than 20%.    MRI May 2024 demonstrated  subendocardial LGE in a focal region of the mid inferior wall.  Sarcoid could not be excluded.  FDG PET has been ordered but was not done because of insurance problems.  The EF was 30 - 35% on echo in August, 2024.  Most recently the EF thankfully in June 2025 was 50 to 55%.  She did have abnormal strain.   Since she was last seen she has done well except for diffuse joint pain.  She has a home aide who is there for 7 days a week.  She still does get to the gym and does some light exercising but she is limited by pain in her joints and back and neck.  She has had no new cardiovascular complaints however.  She does not have palpitations, presyncope or syncope.  She has no shortness of breath, PND or orthopnea.  She is tolerating her medications.  ROS: As stated in the HPI and negative for all other systems.  Studies Reviewed:    EKG:     NA  Risk Assessment/Calculations:         Physical Exam:   VS:  BP 126/82 (BP Location: Left Arm, Patient Position: Sitting, Cuff Size: Large)    Pulse 82   Ht 5' 2 (1.575 m)   Wt 283 lb (128.4 kg)   SpO2 98%   BMI 51.76 kg/m    Wt Readings from Last 3 Encounters:  09/10/24 283 lb (128.4 kg)  07/13/24 273 lb (123.8 kg)  07/08/24 277 lb 9.6 oz (125.9 kg)     GEN: Well nourished, well developed in no acute distress NECK: No JVD; No carotid bruits CARDIAC: RRR, no murmurs, rubs, gallops RESPIRATORY:  Clear to auscultation without rales, wheezing or rhonchi  ABDOMEN: Soft, non-tender, non-distended EXTREMITIES:  No edema; No deformity   ASSESSMENT AND PLAN:   Heart Failure with reduced EF:   Her ejection fraction improved on optimal medical therapy.  She has class I symptoms.  No change in therapy.   Right M2 stroke:   She has no residual symptoms and was switched to aspirin  from DOAC at the last visit.  No change in therapy.   DMII: She has gained some weight back because of inability to exercise.  I have suggested water aerobics at the [facility].    NSVT /PVCs :   She does not have any symptoms related to this.  No change in therapy.  Follow up with an APP in 6 months.  Signed, Lynwood  Lavona, MD

## 2024-09-10 ENCOUNTER — Encounter: Payer: Self-pay | Admitting: Cardiology

## 2024-09-10 ENCOUNTER — Ambulatory Visit: Attending: Cardiology | Admitting: Cardiology

## 2024-09-10 VITALS — BP 126/82 | HR 82 | Ht 62.0 in | Wt 283.0 lb

## 2024-09-10 DIAGNOSIS — I5020 Unspecified systolic (congestive) heart failure: Secondary | ICD-10-CM | POA: Diagnosis not present

## 2024-09-10 DIAGNOSIS — I4729 Other ventricular tachycardia: Secondary | ICD-10-CM

## 2024-09-10 NOTE — Patient Instructions (Signed)
 Medication Instructions:  Your physician recommends that you continue on your current medications as directed. Please refer to the Current Medication list given to you today.  *If you need a refill on your cardiac medications before your next appointment, please call your pharmacy*  Lab Work: NONE If you have labs (blood work) drawn today and your tests are completely normal, you will receive your results only by: MyChart Message (if you have MyChart) OR A paper copy in the mail If you have any lab test that is abnormal or we need to change your treatment, we will call you to review the results.  Testing/Procedures: NONE  Follow-Up: At [facility], you and your health needs are our priority.  As part of our continuing mission to provide you with exceptional heart care, our providers are all part of one team.  This team includes your primary Cardiologist (physician) and Advanced Practice Providers or APPs (Physician Assistants and Nurse Practitioners) who all work together to provide you with the care you need, when you need it.  Your next appointment:   6 month(s)  Provider:   One of our Advanced Practice Providers (APPs): Morse Clause, PA-C  Lamarr Satterfield, NP Miriam Shams, NP  Olivia Pavy, PA-C Josefa Beauvais, NP  Leontine Salen, PA-C Orren Fabry, PA-C  Hao Meng, PA-C Ernest Dick, NP  Damien Braver, NP Jon Hails, PA-C  Waddell Donath, PA-C    Dayna Dunn, PA-C  Scott Weaver, PA-C Lum Louis, NP Katlyn West, NP Callie Goodrich, PA-C  Xika Zhao, NP Sheng Haley, PA-C    Kathleen Johnson, PA-C    We recommend signing up for the patient portal called MyChart.  Sign up information is provided on this After Visit Summary.  MyChart is used to connect with patients for Virtual Visits (Telemedicine).  Patients are able to view lab/test results, encounter notes, upcoming appointments, etc.  Non-urgent messages can be sent to your provider as well.   To learn more  about what you can do with MyChart, go to [email or web address].

## 2024-09-11 DIAGNOSIS — N3946 Mixed incontinence: Secondary | ICD-10-CM | POA: Diagnosis not present

## 2024-09-11 DIAGNOSIS — R152 Fecal urgency: Secondary | ICD-10-CM | POA: Diagnosis not present

## 2024-09-11 DIAGNOSIS — R159 Full incontinence of feces: Secondary | ICD-10-CM | POA: Diagnosis not present

## 2024-09-11 DIAGNOSIS — I5021 Acute systolic (congestive) heart failure: Secondary | ICD-10-CM | POA: Diagnosis not present

## 2024-09-20 DIAGNOSIS — I509 Heart failure, unspecified: Secondary | ICD-10-CM | POA: Diagnosis not present

## 2024-09-21 DIAGNOSIS — I509 Heart failure, unspecified: Secondary | ICD-10-CM | POA: Diagnosis not present

## 2024-09-22 DIAGNOSIS — I509 Heart failure, unspecified: Secondary | ICD-10-CM | POA: Diagnosis not present

## 2024-09-23 DIAGNOSIS — I509 Heart failure, unspecified: Secondary | ICD-10-CM | POA: Diagnosis not present

## 2024-09-24 DIAGNOSIS — I509 Heart failure, unspecified: Secondary | ICD-10-CM | POA: Diagnosis not present

## 2024-09-25 DIAGNOSIS — I509 Heart failure, unspecified: Secondary | ICD-10-CM | POA: Diagnosis not present

## 2024-09-26 DIAGNOSIS — I509 Heart failure, unspecified: Secondary | ICD-10-CM | POA: Diagnosis not present

## 2024-09-27 DIAGNOSIS — I509 Heart failure, unspecified: Secondary | ICD-10-CM | POA: Diagnosis not present

## 2024-09-28 DIAGNOSIS — I509 Heart failure, unspecified: Secondary | ICD-10-CM | POA: Diagnosis not present

## 2024-09-29 DIAGNOSIS — I509 Heart failure, unspecified: Secondary | ICD-10-CM | POA: Diagnosis not present

## 2024-09-30 DIAGNOSIS — I509 Heart failure, unspecified: Secondary | ICD-10-CM | POA: Diagnosis not present

## 2024-10-01 DIAGNOSIS — I509 Heart failure, unspecified: Secondary | ICD-10-CM | POA: Diagnosis not present

## 2024-10-02 DIAGNOSIS — I509 Heart failure, unspecified: Secondary | ICD-10-CM | POA: Diagnosis not present

## 2024-10-03 DIAGNOSIS — I509 Heart failure, unspecified: Secondary | ICD-10-CM | POA: Diagnosis not present

## 2024-10-08 ENCOUNTER — Ambulatory Visit: Payer: Self-pay

## 2024-10-08 NOTE — Telephone Encounter (Signed)
 Noted.  Patient has upcoming appointment to discuss

## 2024-10-08 NOTE — Telephone Encounter (Signed)
 FYI Only or Action Required?: Action required by provider: update on patient condition.  Patient was last seen in primary care on 07/13/2024 by Vicci Barnie NOVAK, MD.  Called Nurse Triage reporting Gastroesophageal Reflux.  Symptoms began several days ago.  Interventions attempted: OTC medications: TUMS.  Symptoms are: gradually worsening.  Triage Disposition: Call PCP Within 24 Hours  Patient/caregiver understands and will follow disposition?: Yes  Message from Kevelyn M sent at 10/08/2024 12:32 PM EST  Reason for Triage: Patient calling because she's been having acid reflux really bad. She's requesting medication. Tums is not working. Also, pain patches, tylenol , and creme aren't working anymore for arthritis anymore. Patient is requesting a muscle relaxer.   Reason for Disposition  [1] Patient says chest pain feels exactly the same as previously diagnosed heartburn AND [2] describes burning in chest AND [3] accompanying sour taste in mouth  Answer Assessment - Initial Assessment Questions Patient with heartburn since Weds- can feel the acid coming up her esophagus into her throat. TUMS taking the edge off but not doing a lot. Looking for prescription heartburn relief so that it can be covered by insurance. Cannot afford otherwise. Lactose intolerant so cannot drink milk. Advised to try apple cider vinegar.- sleeping propped up if she can fall asleep. Only a few hours sleep in the last few days.   Denies referred pain, CP, SOB, Dizziness. Will go to ED if anything changes  Also with chronic arthritis pain, bursitis in the right shoulder, sciatica, and arthritis in knees. Tylenol , pain patches and pain cream are no longer helping. She is allergic to a lot of meds-Tramdol, nsaids, codeine, hydro, oxy- needing better pain control.   Appt with pcp office on Monday- ED/UC advised in the interim or more OTC options available.  1. LOCATION: Where does it hurt?       Heartburn  2.  RADIATION: Does the pain go anywhere else? (e.g., into neck, jaw, arms, back)     denies 3. ONSET: When did the chest pain begin? (Minutes, hours or days)      Weds 2am  4. PATTERN: Does the pain come and go, or has it been constant since it started?  Does it get worse with exertion?      Straight up her throat- can taste acid with belching. Constant burn  5. DURATION: How long does it last (e.g., seconds, minutes, hours)     For last 2-3 days  6. SEVERITY: How bad is the pain?  (e.g., Scale 1-10; mild, moderate, or severe)     Burning- 8-10/10 heartburn  7. CARDIAC RISK FACTORS: Do you have any history of heart problems or risk factors for heart disease? (e.g., angina, prior heart attack; diabetes, high blood pressure, high cholesterol, smoker, or strong family history of heart disease)     HTN, HF,  8. PULMONARY RISK FACTORS: Do you have any history of lung disease?  (e.g., blood clots in lung, asthma, emphysema, birth control pills)     Denies  9. CAUSE: What do you think is causing the chest pain?     Heartburn  10. OTHER SYMPTOMS: Do you have any other symptoms? (e.g., dizziness, nausea, vomiting, sweating, fever, difficulty breathing, cough)       Denies  Protocols used: Chest Pain-A-AH

## 2024-10-11 ENCOUNTER — Ambulatory Visit: Admitting: Family Medicine

## 2024-10-11 ENCOUNTER — Encounter: Payer: Self-pay | Admitting: Family Medicine

## 2024-10-11 DIAGNOSIS — K219 Gastro-esophageal reflux disease without esophagitis: Secondary | ICD-10-CM

## 2024-10-11 DIAGNOSIS — G8929 Other chronic pain: Secondary | ICD-10-CM

## 2024-10-11 MED ORDER — TIZANIDINE HCL 4 MG PO TABS: 4.0000 mg | ORAL_TABLET | Freq: Three times a day (TID) | ORAL | 1 refills | Status: AC | PRN

## 2024-10-11 MED ORDER — OMEPRAZOLE 40 MG PO CPDR: 40.0000 mg | DELAYED_RELEASE_CAPSULE | Freq: Every day | ORAL | 1 refills | Status: AC

## 2024-10-11 NOTE — Patient Instructions (Signed)
 VISIT SUMMARY:  Today, you were seen for new onset heartburn and chronic pain management. You have been experiencing painful heartburn after meals for the past week, and your current treatment with Tums has not been effective. Additionally, you have chronic pain in multiple joints, and your current pain management regimen is becoming less effective.  YOUR PLAN:  -GASTROESOPHAGEAL REFLUX DISEASE (GERD): GERD is a condition where stomach acid frequently flows back into the tube connecting your mouth and stomach, causing heartburn. You have been prescribed omeprazole  to take in the morning before your first meal to help reduce stomach acid and relieve your symptoms.  -CHRONIC PAIN DUE TO ARTHRITIS: Arthritis is a condition that causes inflammation and pain in the joints. You have been experiencing chronic pain in your shoulder, neck, lower back, knees, and wrists. You have been prescribed tizanidine  to take every eight hours as needed for pain relief. Be aware that this medication may cause drowsiness.  INSTRUCTIONS:  Please follow up with us  if your symptoms do not improve or if you experience any side effects from the new medications. Continue to avoid NSAIDs and other medications you are allergic to. If you have any questions or concerns, do not hesitate to contact our office.

## 2024-10-11 NOTE — Progress Notes (Signed)
 .  Virtual Visit via Video Note  I connected with Stephanie Reilly, on 10/11/2024 at 12:16 PM by video enabled telemedicine device and verified that I am speaking with the correct person using two identifiers.   Consent: I discussed the limitations, risks, security and privacy concerns of performing an evaluation and management service by telemedicine and the availability of in person appointments. I also discussed with the patient that there may be a patient responsible charge related to this service. The patient expressed understanding and agreed to proceed.   Location of Patient: Home  Location of Provider: Clinic   Persons participating in Telemedicine visit: Decklyn Hornik Dr. Delbert    Discussed the use of AI scribe software for clinical note transcription with the patient, who gave verbal consent to proceed.  History of Present Illness Stephanie Reilly is a 54 year old female patient of Dr. Vicci with a history of hypertension, congestive heart failure, osteoarthritis who presents with new onset heartburn and chronic pain management issues.  She has had painful heartburn for the past week that starts about 15 minutes after eating any meal and then persists throughout the day. It makes swallowing uncomfortable. Her diet is unchanged. Tums have not helped, and she is concerned about using them frequently.  She has no vomiting, nausea, hematochezia, diarrhea or constipation.  She has chronic pain in her shoulder, neck, lower back, knees, and wrists. She uses Tylenol  Arthritis, pain cream, and pain patches, but these are becoming less effective, and Tylenol  recently took about 12 hours to relieve pain. She cannot use NSAIDs due to allergies and also has allergies to serotonin-related drugs, tramadol, and codeine, with prior adverse reactions to depression medications similar to Cymbalta that might otherwise be used for pain.      Past Medical History:  Diagnosis Date   Anemia     Anxiety    Arthritis    Depression    Hypertension    Prediabetes    Allergies[1]  Medications Ordered Prior to Encounter[2]  ROS: See HPI  Observations/Objective: Awake, alert, oriented x3 Not in acute distress Normal mood      Latest Ref Rng & Units 07/13/2024   11:24 AM 07/08/2024   10:08 AM 03/17/2024   10:16 AM  CMP  Glucose 70 - 99 mg/dL  889  891   BUN 6 - 20 mg/dL  16  14   Creatinine 9.55 - 1.00 mg/dL  9.03  9.20   Sodium 864 - 145 mmol/L  138  139   Potassium 3.5 - 5.1 mmol/L  3.9  4.5   Chloride 98 - 111 mmol/L  106  111   CO2 22 - 32 mmol/L  25  23   Calcium  8.9 - 10.3 mg/dL  9.6  89.5   Total Protein 6.0 - 8.5 g/dL 8.2     Total Bilirubin 0.0 - 1.2 mg/dL 0.3     Alkaline Phos 49 - 135 IU/L 139     AST 0 - 40 IU/L 14     ALT 0 - 32 IU/L 15       Lipid Panel     Component Value Date/Time   CHOL 136 07/13/2024 1124   TRIG 87 07/13/2024 1124   HDL 55 07/13/2024 1124   CHOLHDL 2.5 07/13/2024 1124   CHOLHDL 3.4 02/16/2023 0945   VLDL 12 02/16/2023 0945   LDLCALC 64 07/13/2024 1124   LABVLDL 17 07/13/2024 1124    Lab Results  Component Value Date  HGBA1C 6.3 07/13/2024     Assessment and plan:  Assessment & Plan Gastroesophageal reflux disease Acute heartburn postprandially with pain and dysphagia. Ineffective response to Tums. - Prescribed omeprazole  to be taken in the morning before the first meal.  Chronic pain due to arthritis, multiple sites Chronic joint pain in shoulder, neck, lower back, knees, and wrists. Limited treatment options due to allergies. Current regimen losing efficacy. -Unable to use NSAID due to allergy and she does have an allergy to tramadol, codeine and ?SSRIs which precludes use of Cymbalta  - Prescribed tizanidine  every eight hours as needed. Discussed drowsiness as a potential side effect.      Meds ordered this encounter  Medications   omeprazole  (PRILOSEC) 40 MG capsule    Sig: Take 1 capsule (40 mg  total) by mouth daily.    Dispense:  30 capsule    Refill:  1   tiZANidine  (ZANAFLEX ) 4 MG tablet    Sig: Take 1 tablet (4 mg total) by mouth every 8 (eight) hours as needed.    Dispense:  60 tablet    Refill:  1    Follow Up Instructions: Keep previously scheduled appointment   I discussed the assessment and treatment plan with the patient. The patient was provided an opportunity to ask questions and all were answered. The patient agreed with the plan and demonstrated an understanding of the instructions.   The patient was advised to call back or seek an in-person evaluation if the symptoms worsen or if the condition fails to improve as anticipated.     I provided 12 minutes total of Telehealth time during this encounter including median intraservice time, reviewing previous notes, investigations, ordering medications, medical decision making, coordinating care and patient verbalized understanding at the end of the visit.     Corrina Sabin, MD, FAAFP. [facility] [place], [place] [phone]   10/11/2024, 12:16 PM     [1]  Allergies Allergen Reactions   Paroxetine Hcl Anaphylaxis   Prednisone Shortness Of Breath and Other (See Comments)    Confusion, messes with mental, muscles feel weird, fatigue, insomnia   Chocolate Other (See Comments)    Unknown    Ibuprofen Other (See Comments)    Unknown    Iron Other (See Comments)    GI upset (reported 08/04/2017). Pt clarifies had constipation, nausea.  Stool softeners did not help.    Latex Other (See Comments)    Unknown    Lisinopril Hives   Naproxen Other (See Comments)    Unknown    Tramadol Hives   Codeine Rash   Penicillins Rash   Serotonin Nausea Only, Swelling, Anxiety, Rash and Other (See Comments)    GI intolerence  [2]  Current Outpatient Medications on File Prior to Visit  Medication Sig Dispense Refill   aspirin  EC 81 MG tablet Take 1 tablet (81 mg total) by mouth  daily. Swallow whole.     Biotin (BIOTIN MAXIMUM STRENGTH) 10 MG TABS Take 10 mg by mouth daily.     carvedilol  (COREG ) 25 MG tablet Take 1 tablet (25 mg total) by mouth 2 (two) times daily with a meal. 180 tablet 3   cetirizine (ZYRTEC) 10 MG tablet Take 10 mg by mouth daily.     dapagliflozin  propanediol (FARXIGA ) 10 MG TABS tablet Take 1 tablet (10 mg total) by mouth daily. 90 tablet 3   fluticasone  (FLONASE ) 50 MCG/ACT nasal spray Place 1 spray into both nostrils daily. 9.9 mL  4   polyethylene glycol powder (GLYCOLAX /MIRALAX ) 17 GM/SCOOP powder MIX 17 GRAMS WITH LIQUID AND DRINK EVERY DAY AS NEEDED 510 g 3   rosuvastatin  (CRESTOR ) 20 MG tablet Take 1 tablet (20 mg total) by mouth daily. 90 tablet 3   sacubitril -valsartan  (ENTRESTO ) 97-103 MG Take 1 tablet by mouth 2 (two) times daily. 180 tablet 3   senna-docusate (SENOKOT-S) 8.6-50 MG tablet Take 1 tablet by mouth at bedtime as needed for mild constipation. 30 tablet 0   spironolactone  (ALDACTONE ) 50 MG tablet Take 1 tablet (50 mg total) by mouth daily. 30 tablet 11   torsemide  (DEMADEX ) 20 MG tablet Take 1 tablet (20 mg total) by mouth daily as needed (for increased edema). 30 tablet 11   No current facility-administered medications on file prior to visit.

## 2024-11-01 ENCOUNTER — Ambulatory Visit (HOSPITAL_COMMUNITY): Admitting: Clinical

## 2024-11-01 DIAGNOSIS — F331 Major depressive disorder, recurrent, moderate: Secondary | ICD-10-CM

## 2024-11-01 NOTE — Progress Notes (Signed)
" ° °  THERAPIST PROGRESS NOTE Virtual Visit via Video Note  I connected with Stephanie Reilly on 11/01/24 at  4:00 PM EST by a video enabled telemedicine application and verified that I am speaking with the correct person using two identifiers.  Location: Patient: home Provider: remote home office   I discussed the limitations of evaluation and management by telemedicine and the availability of in person appointments. The patient expressed understanding and agreed to proceed.   Follow Up Instructions: I discussed the assessment and treatment plan with the patient. The patient was provided an opportunity to ask questions and all were answered. The patient agreed with the plan and demonstrated an understanding of the instructions.   The patient was advised to call back or seek an in-person evaluation if the symptoms worsen or if the condition fails to improve as anticipated.    Session Time: 40 min  Participation Level: Active  Behavioral Response: CasualAlertEuthymic  Type of Therapy: Individual Therapy  Treatment Goals addressed: client will engage in at least 80% of scheduled individual psychotherapy sessions  ProgressTowards Goals: Progressing  Interventions: CBT  Summary:  Stephanie Reilly is a 54 y.o. female who presents for the scheduled appointment oriented times five, appropriately dressed and friendly. Client denied hallucinations and delusions. Client reported she is doing pretty well. Client reported her kids are doing well. Client reported she has had multiple deaths of family friends and family. Client reported she is coping with it pretty okay. Client reported her friends who live out of state, they talk regularly and keep each other up beat. Client reported the holidays was good with her children and grandchildren. Client reported they do a lot of laughing together as well. Client reported she is going to talk to her PCP about her hormones and menopause which could be  affecting her weight.  Evidence of progress towards goal:  client reported 1 positive of staying active with doing modified at home work outs which she enjoys.   Suicidal/Homicidal: Nowithout intent/plan  Therapist Response:  Therapist began the appointment asking the client how she has been doing. Therapist engaged with active listening and positive emotional support. Therapist used cbt to give her time to discuss her thoughts and emotions about recent events. Therapist used cbt to positively reinforce her engagement with family and social support as well as enjoyed activities. Therapist used CBT ask the client to identify her progress with frequency of use with coping skills with continued practice in her daily activity.      Plan: Return again in 3 weeks.  Diagnosis: mdd, recurrent episode moderate with anxious distress  Collaboration of Care: Patient refused AEB none requested by the client.  Patient/Guardian was advised Release of Information must be obtained prior to any record release in order to collaborate their care with an outside provider. Patient/Guardian was advised if they have not already done so to contact the registration department to sign all necessary forms in order for us  to release information regarding their care.   Consent: Patient/Guardian gives verbal consent for treatment and assignment of benefits for services provided during this visit. Patient/Guardian expressed understanding and agreed to proceed.   Auset Fritzler Y Azula Zappia, LCSW 11/01/2024  "

## 2024-11-15 ENCOUNTER — Ambulatory Visit: Admitting: Internal Medicine

## 2024-11-18 ENCOUNTER — Ambulatory Visit (HOSPITAL_COMMUNITY): Admitting: Clinical

## 2024-12-09 ENCOUNTER — Ambulatory Visit: Admitting: Internal Medicine

## 2024-12-28 ENCOUNTER — Ambulatory Visit: Admitting: Neurology
